# Patient Record
Sex: Male | Born: 1950
Health system: Southern US, Community
[De-identification: ages and names within clinical notes are randomized; demographics above are authoritative.]

## PROBLEM LIST (undated history)

## (undated) DIAGNOSIS — I739 Peripheral vascular disease, unspecified: Secondary | ICD-10-CM

## (undated) DIAGNOSIS — M1990 Unspecified osteoarthritis, unspecified site: Secondary | ICD-10-CM

## (undated) DIAGNOSIS — I7090 Unspecified atherosclerosis: Secondary | ICD-10-CM

## (undated) DIAGNOSIS — Z8489 Family history of other specified conditions: Secondary | ICD-10-CM

## (undated) DIAGNOSIS — N400 Enlarged prostate without lower urinary tract symptoms: Secondary | ICD-10-CM

## (undated) DIAGNOSIS — R7303 Prediabetes: Secondary | ICD-10-CM

## (undated) DIAGNOSIS — I10 Essential (primary) hypertension: Secondary | ICD-10-CM

## (undated) DIAGNOSIS — E7800 Pure hypercholesterolemia, unspecified: Secondary | ICD-10-CM

## (undated) DIAGNOSIS — K219 Gastro-esophageal reflux disease without esophagitis: Secondary | ICD-10-CM

## (undated) DIAGNOSIS — R972 Elevated prostate specific antigen [PSA]: Secondary | ICD-10-CM

## (undated) DIAGNOSIS — C4490 Unspecified malignant neoplasm of skin, unspecified: Secondary | ICD-10-CM

## (undated) DIAGNOSIS — F430 Acute stress reaction: Secondary | ICD-10-CM

## (undated) DIAGNOSIS — Z87442 Personal history of urinary calculi: Secondary | ICD-10-CM

## (undated) DIAGNOSIS — C801 Malignant (primary) neoplasm, unspecified: Secondary | ICD-10-CM

## (undated) HISTORY — DX: Benign prostatic hyperplasia without lower urinary tract symptoms: N40.0

## (undated) HISTORY — DX: Pure hypercholesterolemia, unspecified: E78.00

## (undated) HISTORY — DX: Essential (primary) hypertension: I10

## (undated) HISTORY — DX: Elevated prostate specific antigen (PSA): R97.20

## (undated) HISTORY — DX: Peripheral vascular disease, unspecified: I73.9

## (undated) HISTORY — DX: Malignant (primary) neoplasm, unspecified: C80.1

## (undated) HISTORY — DX: Unspecified atherosclerosis: I70.90

## (undated) HISTORY — DX: Acute stress reaction: F43.0

## (undated) HISTORY — PX: ILIAC ARTERY STENT: SHX1786

---

## 2000-06-22 ENCOUNTER — Other Ambulatory Visit: Admission: RE | Admit: 2000-06-22 | Discharge: 2000-06-22 | Payer: Self-pay | Admitting: Family Medicine

## 2002-08-28 ENCOUNTER — Emergency Department (HOSPITAL_COMMUNITY): Admission: EM | Admit: 2002-08-28 | Discharge: 2002-08-28 | Payer: Self-pay | Admitting: Emergency Medicine

## 2011-03-14 ENCOUNTER — Other Ambulatory Visit: Payer: Self-pay | Admitting: Interventional Cardiology

## 2011-03-14 ENCOUNTER — Encounter (HOSPITAL_COMMUNITY): Payer: Self-pay | Admitting: Pharmacy Technician

## 2011-03-17 ENCOUNTER — Other Ambulatory Visit: Payer: Self-pay

## 2011-03-17 ENCOUNTER — Ambulatory Visit (HOSPITAL_COMMUNITY)
Admission: RE | Admit: 2011-03-17 | Discharge: 2011-03-17 | Disposition: A | Discharge status: Home / Self Care | Payer: 59 | Source: Ambulatory Visit | Attending: Interventional Cardiology | Admitting: Interventional Cardiology

## 2011-03-17 ENCOUNTER — Encounter (HOSPITAL_COMMUNITY)
Admission: RE | Disposition: A | Discharge status: Home / Self Care | Payer: Self-pay | Source: Ambulatory Visit | Attending: Interventional Cardiology

## 2011-03-17 DIAGNOSIS — I70219 Atherosclerosis of native arteries of extremities with intermittent claudication, unspecified extremity: Secondary | ICD-10-CM | POA: Insufficient documentation

## 2011-03-17 HISTORY — PX: ABDOMINAL ANGIOGRAM: SHX5499

## 2011-03-17 HISTORY — PX: LOWER EXTREMITY ANGIOGRAM: SHX5508

## 2011-03-17 LAB — POCT ACTIVATED CLOTTING TIME
Activated Clotting Time: 193 seconds
Activated Clotting Time: 204 seconds
Activated Clotting Time: 215 seconds
Activated Clotting Time: 182 seconds

## 2011-03-17 SURGERY — ANGIOGRAM, LOWER EXTREMITY
Anesthesia: LOCAL

## 2011-03-17 MED ORDER — SODIUM CHLORIDE 0.9 % IV SOLN: 250.0000 mL | INTRAVENOUS | Status: DC | PRN

## 2011-03-17 MED ORDER — LIDOCAINE HCL (PF) 1 % IJ SOLN
INTRAMUSCULAR | Status: AC
  Filled 2011-03-17: qty 30

## 2011-03-17 MED ORDER — ACETAMINOPHEN 325 MG PO TABS: 650.0000 mg | ORAL_TABLET | ORAL | Status: DC | PRN

## 2011-03-17 MED ORDER — SODIUM CHLORIDE 0.9 % IJ SOLN: 3.0000 mL | INTRAMUSCULAR | Status: DC | PRN

## 2011-03-17 MED ORDER — SODIUM CHLORIDE 0.9 % IJ SOLN: 3.0000 mL | Freq: Two times a day (BID) | INTRAMUSCULAR | Status: DC

## 2011-03-17 MED ORDER — VERAPAMIL HCL 2.5 MG/ML IV SOLN
INTRAVENOUS | Status: AC
  Filled 2011-03-17: qty 2

## 2011-03-17 MED ORDER — ASPIRIN 81 MG PO CHEW
324.0000 mg | CHEWABLE_TABLET | ORAL | Status: AC
  Administered 2011-03-17: 324 mg via ORAL
  Filled 2011-03-17: qty 4

## 2011-03-17 MED ORDER — HEPARIN SODIUM (PORCINE) 1000 UNIT/ML IJ SOLN
INTRAMUSCULAR | Status: AC
  Filled 2011-03-17: qty 1

## 2011-03-17 MED ORDER — CLOPIDOGREL BISULFATE 75 MG PO TABS: 75.0000 mg | ORAL_TABLET | Freq: Every day | ORAL | Status: DC

## 2011-03-17 MED ORDER — MORPHINE SULFATE 4 MG/ML IJ SOLN: 1.0000 mg | INTRAMUSCULAR | Status: DC | PRN

## 2011-03-17 MED ORDER — FENTANYL CITRATE 0.05 MG/ML IJ SOLN
INTRAMUSCULAR | Status: AC
  Filled 2011-03-17: qty 2

## 2011-03-17 MED ORDER — CLOPIDOGREL BISULFATE 300 MG PO TABS
ORAL_TABLET | ORAL | Status: AC
  Filled 2011-03-17: qty 2

## 2011-03-17 MED ORDER — HEPARIN (PORCINE) IN NACL 2-0.9 UNIT/ML-% IJ SOLN
INTRAMUSCULAR | Status: AC
  Filled 2011-03-17: qty 2000

## 2011-03-17 MED ORDER — DIAZEPAM 5 MG PO TABS
5.0000 mg | ORAL_TABLET | ORAL | Status: AC
  Administered 2011-03-17: 5 mg via ORAL
  Filled 2011-03-17: qty 1

## 2011-03-17 MED ORDER — MIDAZOLAM HCL 2 MG/2ML IJ SOLN
INTRAMUSCULAR | Status: AC
  Filled 2011-03-17: qty 2

## 2011-03-17 MED ORDER — ASPIRIN 81 MG PO CHEW
81.0000 mg | CHEWABLE_TABLET | Freq: Every day | ORAL | Status: DC
Start: 2011-03-17 — End: 2011-03-17

## 2011-03-17 MED ORDER — CLOPIDOGREL BISULFATE 75 MG PO TABS: 75.0000 mg | ORAL_TABLET | Freq: Every day | ORAL | Status: AC

## 2011-03-17 MED ORDER — SODIUM CHLORIDE 0.9 % IV SOLN: 1.0000 mL/kg/h | INTRAVENOUS | Status: DC

## 2011-03-17 MED ORDER — SODIUM CHLORIDE 0.9 % IV SOLN
INTRAVENOUS | Status: DC
  Administered 2011-03-17: 10:00:00 via INTRAVENOUS

## 2011-03-17 MED ORDER — ONDANSETRON HCL 4 MG/2ML IJ SOLN: 4.0000 mg | Freq: Four times a day (QID) | INTRAMUSCULAR | Status: DC | PRN

## 2011-03-17 MED ORDER — ALUM & MAG HYDROXIDE-SIMETH 200-200-20 MG/5ML PO SUSP
ORAL | Status: AC
  Filled 2011-03-17: qty 30

## 2011-03-17 NOTE — CV Procedure (Signed)
PROCEDURE:  Abdominal aortogram, pelvic angiogram; right lower extremity runoff, left lower extremity runoff.  Rotational atherectomy of the left, distal SFA.  PTA of the left distal SFA.  INDICATIONS:  Persistent claudication  The risks, benefits, and details of the procedure were explained to the patient.  The patient verbalized understanding and wanted to proceed.  Informed written consent was obtained.  PROCEDURE TECHNIQUE:  After Xylocaine anesthesia a 41F sheath was placed in the right femoral artery with a single anterior needle wall stick.  A pigtail catheter was advanced to the abdominal aortogram.  Heart ejection contrast was performed in the AP projection.  The catheter was withdrawn to the aorta iliac bifurcation and a power injection of contrast was performed.  Through the sheath in the right common femoral artery, and power injection of contrast was performed to visualize the right lower extremity.  A 5 French crossover catheter was used to obtain access to the left iliac system.  A 5 French endhole catheter was advanced to the left common femoral artery.  Apparent ejection of contrast was performed through the endhole catheter to image the left lower extremity.   CONTRAST:  Total of 160 cc.  COMPLICATIONS:  None.     ANGIOGRAPHIC DATA:     The abdominal aortogram showed mild atherosclerosis in the distal portion of the aorta.  There is ectasia.  There are bilateral single renal arteries which appear widely patent.  The pelvic angiogram shows mild calcific disease in the right common iliac artery, but this is widely patent.  The left common iliac artery has mild disease and is also widely patent.  Bilateral internal iliac arteries are patent.  A lateral extra iliac arteries have mild atherosclerosis.  Right lower extremity: The right external iliac artery is widely patent.  The right common femoral artery is widely patent.  The right profunda femoral artery is widely patent.  The  proximal SFA has mild atherosclerosis but is widely patent.  The mid SFA has some calcification and appears widely patent.  There is moderate disease in the distal SFA leading into a severe stenosis in the adductor canal of 90%.  There is some calcification in the right popliteal but this vessel appears widely patent.  There is three-vessel runoff below the knee.  The anterior tibial artery has mild atherosclerosis.  Left lower extremity: The distal left external iliac artery appears widely patent.  The left common femoral artery appears widely patent.  The left profunda femoral artery is widely patent.  In the proximal SFA, there is mild to moderate atherosclerosis.  This continues in the mid SFA.  In the distal SFA, there is 5 cm segment of calcification with a focal 90% stenosis.  The popliteal artery has mild disease but is patent.  There is patent 3 vessel runoff below the knee.   INTERVENTIONAL NARRATIVE: A 6 French to remote destination sheath was used to obtain access to the left common femoral artery from the right groin.  This sheath was advanced over a versa core wire.  Heparin was used for anticoagulation.  An ACT was used to check that it was therapeutic.  A viper wire was advanced into the left SFA.  A diamondback burr was advanced to the left SFA.  Multiple passes were used at all 3 available speeds.  Repeat angiography showed significant improvement in the appearance of this calcified area.  A 4.0 x 4 cm balloon was inflated to post dilate the lesion.  There is an excellent angiographic  appearance.  3 vessel runoff was maintained.  IMPRESSIONS:  1. No abnormal aortic aneurysm.  Mild diffuse atherosclerosis in the vessels above the hip joints bilaterally without hemodynamically significant stenosis. 2.   90% focal distal right SFA lesion in the adductor canal. 3.   90% focal calcified lesion in the left distal SFA.  This was successfully treated with rotational atherectomy followed by  balloon angioplasty.  There is an excellent angiographic result.  There is no visible dissection.  There is a 10% residual stenosis.  RECOMMENDATION:  Continue aspirin and Plavix for at least a month.  He will be able to go home later today assuming no groin complications.  I will followup with him in the office.  He will take it easy for a few days and then resume regular walking.

## 2011-03-17 NOTE — H&P (Signed)
Date of Initial H&P: 03/11/11  History reviewed, patient examined, no change in status, stable for surgery.

## 2011-03-17 NOTE — Progress Notes (Signed)
Orthostatic vs done and pt tolerated well.  Removed bulk dressing and placed bandaid to site.  Ambulated pt in hallway and pt tolerted well.  Right groin remained level zero post ambulation.  Reviewed pt's d/c instructions with pt and family who both verbalized understanding.  Pt d/c home with wife via wheelchair

## 2013-03-20 ENCOUNTER — Encounter: Payer: Self-pay | Admitting: Interventional Cardiology

## 2013-04-25 ENCOUNTER — Ambulatory Visit: Payer: 59 | Admitting: Interventional Cardiology

## 2013-10-29 ENCOUNTER — Encounter: Payer: Self-pay | Admitting: *Deleted

## 2013-12-26 ENCOUNTER — Encounter (HOSPITAL_COMMUNITY): Payer: Self-pay | Admitting: Interventional Cardiology

## 2015-08-06 DIAGNOSIS — H6120 Impacted cerumen, unspecified ear: Secondary | ICD-10-CM | POA: Diagnosis not present

## 2015-08-11 DIAGNOSIS — L565 Disseminated superficial actinic porokeratosis (DSAP): Secondary | ICD-10-CM | POA: Diagnosis not present

## 2015-08-11 DIAGNOSIS — L821 Other seborrheic keratosis: Secondary | ICD-10-CM | POA: Diagnosis not present

## 2015-08-11 DIAGNOSIS — L720 Epidermal cyst: Secondary | ICD-10-CM | POA: Diagnosis not present

## 2015-08-11 DIAGNOSIS — L570 Actinic keratosis: Secondary | ICD-10-CM | POA: Diagnosis not present

## 2015-08-11 DIAGNOSIS — D692 Other nonthrombocytopenic purpura: Secondary | ICD-10-CM | POA: Diagnosis not present

## 2015-08-11 DIAGNOSIS — C44319 Basal cell carcinoma of skin of other parts of face: Secondary | ICD-10-CM | POA: Diagnosis not present

## 2015-08-11 DIAGNOSIS — D2372 Other benign neoplasm of skin of left lower limb, including hip: Secondary | ICD-10-CM | POA: Diagnosis not present

## 2015-08-11 DIAGNOSIS — D485 Neoplasm of uncertain behavior of skin: Secondary | ICD-10-CM | POA: Diagnosis not present

## 2015-08-11 DIAGNOSIS — Z85828 Personal history of other malignant neoplasm of skin: Secondary | ICD-10-CM | POA: Diagnosis not present

## 2015-08-11 DIAGNOSIS — Z85820 Personal history of malignant melanoma of skin: Secondary | ICD-10-CM | POA: Diagnosis not present

## 2015-08-27 DIAGNOSIS — C44319 Basal cell carcinoma of skin of other parts of face: Secondary | ICD-10-CM | POA: Diagnosis not present

## 2015-08-27 DIAGNOSIS — Z85828 Personal history of other malignant neoplasm of skin: Secondary | ICD-10-CM | POA: Diagnosis not present

## 2015-10-13 DIAGNOSIS — Z23 Encounter for immunization: Secondary | ICD-10-CM | POA: Diagnosis not present

## 2015-10-13 DIAGNOSIS — Z111 Encounter for screening for respiratory tuberculosis: Secondary | ICD-10-CM | POA: Diagnosis not present

## 2015-10-27 DIAGNOSIS — Z23 Encounter for immunization: Secondary | ICD-10-CM | POA: Diagnosis not present

## 2015-10-27 DIAGNOSIS — Z1211 Encounter for screening for malignant neoplasm of colon: Secondary | ICD-10-CM | POA: Diagnosis not present

## 2015-10-27 DIAGNOSIS — N400 Enlarged prostate without lower urinary tract symptoms: Secondary | ICD-10-CM | POA: Diagnosis not present

## 2015-10-27 DIAGNOSIS — Z0000 Encounter for general adult medical examination without abnormal findings: Secondary | ICD-10-CM | POA: Diagnosis not present

## 2015-10-27 DIAGNOSIS — R7309 Other abnormal glucose: Secondary | ICD-10-CM | POA: Diagnosis not present

## 2015-10-27 DIAGNOSIS — Z125 Encounter for screening for malignant neoplasm of prostate: Secondary | ICD-10-CM | POA: Diagnosis not present

## 2015-10-27 DIAGNOSIS — E7800 Pure hypercholesterolemia, unspecified: Secondary | ICD-10-CM | POA: Diagnosis not present

## 2015-11-09 DIAGNOSIS — Z1211 Encounter for screening for malignant neoplasm of colon: Secondary | ICD-10-CM | POA: Diagnosis not present

## 2016-01-12 DIAGNOSIS — J019 Acute sinusitis, unspecified: Secondary | ICD-10-CM | POA: Diagnosis not present

## 2016-02-15 DIAGNOSIS — L570 Actinic keratosis: Secondary | ICD-10-CM | POA: Diagnosis not present

## 2016-02-15 DIAGNOSIS — D1801 Hemangioma of skin and subcutaneous tissue: Secondary | ICD-10-CM | POA: Diagnosis not present

## 2016-02-15 DIAGNOSIS — L723 Sebaceous cyst: Secondary | ICD-10-CM | POA: Diagnosis not present

## 2016-02-15 DIAGNOSIS — D0339 Melanoma in situ of other parts of face: Secondary | ICD-10-CM | POA: Diagnosis not present

## 2016-02-15 DIAGNOSIS — L820 Inflamed seborrheic keratosis: Secondary | ICD-10-CM | POA: Diagnosis not present

## 2016-02-15 DIAGNOSIS — L821 Other seborrheic keratosis: Secondary | ICD-10-CM | POA: Diagnosis not present

## 2016-02-15 DIAGNOSIS — Z85828 Personal history of other malignant neoplasm of skin: Secondary | ICD-10-CM | POA: Diagnosis not present

## 2016-02-18 DIAGNOSIS — Z85828 Personal history of other malignant neoplasm of skin: Secondary | ICD-10-CM | POA: Diagnosis not present

## 2016-02-18 DIAGNOSIS — L720 Epidermal cyst: Secondary | ICD-10-CM | POA: Diagnosis not present

## 2016-02-29 DIAGNOSIS — Z85828 Personal history of other malignant neoplasm of skin: Secondary | ICD-10-CM | POA: Diagnosis not present

## 2016-02-29 DIAGNOSIS — D0339 Melanoma in situ of other parts of face: Secondary | ICD-10-CM | POA: Diagnosis not present

## 2016-03-01 DIAGNOSIS — D0339 Melanoma in situ of other parts of face: Secondary | ICD-10-CM | POA: Diagnosis not present

## 2016-05-03 DIAGNOSIS — R972 Elevated prostate specific antigen [PSA]: Secondary | ICD-10-CM | POA: Diagnosis not present

## 2016-08-04 DIAGNOSIS — R05 Cough: Secondary | ICD-10-CM | POA: Diagnosis not present

## 2016-08-04 DIAGNOSIS — J069 Acute upper respiratory infection, unspecified: Secondary | ICD-10-CM | POA: Diagnosis not present

## 2016-08-16 DIAGNOSIS — D1801 Hemangioma of skin and subcutaneous tissue: Secondary | ICD-10-CM | POA: Diagnosis not present

## 2016-08-16 DIAGNOSIS — Z85828 Personal history of other malignant neoplasm of skin: Secondary | ICD-10-CM | POA: Diagnosis not present

## 2016-08-16 DIAGNOSIS — Z85820 Personal history of malignant melanoma of skin: Secondary | ICD-10-CM | POA: Diagnosis not present

## 2016-08-16 DIAGNOSIS — L821 Other seborrheic keratosis: Secondary | ICD-10-CM | POA: Diagnosis not present

## 2016-08-16 DIAGNOSIS — L430 Hypertrophic lichen planus: Secondary | ICD-10-CM | POA: Diagnosis not present

## 2016-11-30 DIAGNOSIS — Z1211 Encounter for screening for malignant neoplasm of colon: Secondary | ICD-10-CM | POA: Diagnosis not present

## 2016-11-30 DIAGNOSIS — R972 Elevated prostate specific antigen [PSA]: Secondary | ICD-10-CM | POA: Diagnosis not present

## 2016-11-30 DIAGNOSIS — I70219 Atherosclerosis of native arteries of extremities with intermittent claudication, unspecified extremity: Secondary | ICD-10-CM | POA: Diagnosis not present

## 2016-11-30 DIAGNOSIS — Z1159 Encounter for screening for other viral diseases: Secondary | ICD-10-CM | POA: Diagnosis not present

## 2016-11-30 DIAGNOSIS — N400 Enlarged prostate without lower urinary tract symptoms: Secondary | ICD-10-CM | POA: Diagnosis not present

## 2016-11-30 DIAGNOSIS — Z0000 Encounter for general adult medical examination without abnormal findings: Secondary | ICD-10-CM | POA: Diagnosis not present

## 2016-11-30 DIAGNOSIS — E7800 Pure hypercholesterolemia, unspecified: Secondary | ICD-10-CM | POA: Diagnosis not present

## 2016-12-02 ENCOUNTER — Encounter: Payer: Self-pay | Admitting: Vascular Surgery

## 2016-12-28 ENCOUNTER — Encounter: Payer: Medicare Other | Admitting: Vascular Surgery

## 2017-01-30 DIAGNOSIS — N401 Enlarged prostate with lower urinary tract symptoms: Secondary | ICD-10-CM | POA: Diagnosis not present

## 2017-01-30 DIAGNOSIS — R972 Elevated prostate specific antigen [PSA]: Secondary | ICD-10-CM | POA: Diagnosis not present

## 2017-01-30 DIAGNOSIS — R350 Frequency of micturition: Secondary | ICD-10-CM | POA: Diagnosis not present

## 2017-02-16 DIAGNOSIS — Z85820 Personal history of malignant melanoma of skin: Secondary | ICD-10-CM | POA: Diagnosis not present

## 2017-02-16 DIAGNOSIS — D045 Carcinoma in situ of skin of trunk: Secondary | ICD-10-CM | POA: Diagnosis not present

## 2017-02-16 DIAGNOSIS — Z85828 Personal history of other malignant neoplasm of skin: Secondary | ICD-10-CM | POA: Diagnosis not present

## 2017-02-16 DIAGNOSIS — D485 Neoplasm of uncertain behavior of skin: Secondary | ICD-10-CM | POA: Diagnosis not present

## 2017-02-16 DIAGNOSIS — L821 Other seborrheic keratosis: Secondary | ICD-10-CM | POA: Diagnosis not present

## 2017-02-16 DIAGNOSIS — D0439 Carcinoma in situ of skin of other parts of face: Secondary | ICD-10-CM | POA: Diagnosis not present

## 2017-02-22 ENCOUNTER — Ambulatory Visit (INDEPENDENT_AMBULATORY_CARE_PROVIDER_SITE_OTHER): Payer: Medicare Other | Admitting: Vascular Surgery

## 2017-02-22 ENCOUNTER — Other Ambulatory Visit: Payer: Self-pay | Admitting: *Deleted

## 2017-02-22 ENCOUNTER — Encounter: Payer: Self-pay | Admitting: *Deleted

## 2017-02-22 ENCOUNTER — Encounter: Payer: Self-pay | Admitting: Vascular Surgery

## 2017-02-22 VITALS — BP 140/87 | HR 63 | Temp 97.8°F | Resp 20 | Ht 70.0 in | Wt 229.9 lb

## 2017-02-22 DIAGNOSIS — I70219 Atherosclerosis of native arteries of extremities with intermittent claudication, unspecified extremity: Secondary | ICD-10-CM

## 2017-02-22 NOTE — H&P (View-Only) (Signed)
Patient name: Dennis Patel MRN: [id number] DOB: 11-Nov-1950 Sex: male   REASON FOR CONSULT:    Peripheral vascular disease.  The consult is requested by Dr. Donnie Coffin.  HPI:   Dennis Patel is a pleasant 67 y.o. male, who has a long history of bilateral lower extremity claudication.  This is been going on for about 7-8 years.  He walks about 2 miles a day.  If he walks less his symptoms increase he experiences pain in both calves which is brought on by ambulation and relieved with rest.  This is worse on the left side.  He is also developed some rest pain in both legs especially on the left.  The patient's only real risk factor for peripheral vascular disease is a remote history of tobacco use.  He denies any history of diabetes, hypertension, hypercholesterolemia, or family history of premature cardiovascular disease.  I have reviewed the records that were sent from the referring office.  The patient was seen on 11/30/2016.  The patient was therefore a periodic wellness exam.  Patient had bilateral lower extremity claudication and was sent for vascular consultation.  The patient is also followed with benign prostatic hypertrophy and hypercholesterolemia.   Past Medical History:  Diagnosis Date  . Atherosclerosis   . BPH (benign prostatic hyperplasia)   . Elevated PSA   . Peripheral vascular disease ([place])   . Pure hypercholesterolemia   . 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 Cancer Mother   . Diabetes Mother   . CAD Father   . Diabetes Sister   . CAD Son   . Diabetes Son   . Heart disease Son    There is no family history of premature cardiovascular disease.  SOCIAL HISTORY: He quit smoking 15 years ago. Social History   Socioeconomic History  . Marital status: Married    Spouse name: Not on file  . Number of children: Not on file  . Years of education: Not on file  . Highest education level: Not on file    Social Needs  . Financial resource strain: Not on file  . Food insecurity - worry: Not on file  . Food insecurity - inability: Not on file  . Transportation needs - medical: Not on file  . Transportation needs - non-medical: Not on file  Occupational History  . Not on file  Tobacco Use  . Smoking status: Former Smoker    Types: Cigarettes    Last attempt to quit: 2005    Years since quitting: 14.1  . Smokeless tobacco: Never Used  Substance and Sexual Activity  . Alcohol use: Not on file  . Drug use: Not on file  . Sexual activity: Not on file  Other Topics Concern  . Not on file  Social History Narrative  . Not on file    No Known Allergies  Current Outpatient Medications  Medication Sig Dispense Refill  . doxycycline (VIBRA-TABS) 100 MG tablet Take 100 mg by mouth every morning.    . Multiple Vitamin (MULITIVITAMIN WITH MINERALS) TABS Take 1 tablet by mouth daily.    Marland Kitchen acetaminophen (TYLENOL) 500 MG tablet Take 500 mg by mouth every 6 (six) hours as needed. For pain    . aspirin EC 81 MG tablet Take 81 mg by mouth daily.    . fish oil-omega-3 fatty acids 1000 MG capsule Take 1 g by mouth  daily.      No current facility-administered medications for this visit.     REVIEW OF SYSTEMS:  [X]  denotes positive finding, [ ]  denotes negative finding Cardiac  Comments:  Chest pain or chest pressure:    Shortness of breath upon exertion:    Short of breath when lying flat:    Irregular heart rhythm:        Vascular    Pain in calf, thigh, or hip brought on by ambulation: x   Pain in feet at night that wakes you up from your sleep:  x   Blood clot in your veins:    Leg swelling:         Pulmonary    Oxygen at home:    Productive cough:     Wheezing:         Neurologic    Sudden weakness in arms or legs:     Sudden numbness in arms or legs:     Sudden onset of difficulty speaking or slurred speech:    Temporary loss of vision in one eye:     Problems with  dizziness:         Gastrointestinal    Blood in stool:     Vomited blood:         Genitourinary    Burning when urinating:     Blood in urine:        Psychiatric    Major depression:         Hematologic    Bleeding problems:    Problems with blood clotting too easily:        Skin    Rashes or ulcers:        Constitutional    Fever or chills:     PHYSICAL EXAM:   Vitals:   02/22/17 1010 02/22/17 1013  BP: (!) 142/87 140/87  Pulse: 63   Resp: 20   Temp: 97.8 F (36.6 C)   TempSrc: Oral   SpO2: 97%   Weight: 229 lb 14.4 oz (104.3 kg)   Height: 5\' 10"  (1.778 m)     GENERAL: The patient is a well-nourished male, in no acute distress. The vital signs are documented above. CARDIAC: There is a regular rate and rhythm.  VASCULAR: I do not detect carotid bruits. He has diminished but palpable femoral pulses.  I cannot palpate popliteal or pedal pulses. He has monophasic signals in the dorsalis pedis and posterior tibial positions bilaterally. PULMONARY: There is good air exchange bilaterally without wheezing or rales. ABDOMEN: Soft and non-tender with normal pitched bowel sounds.  MUSCULOSKELETAL: There are no major deformities or cyanosis. NEUROLOGIC: No focal weakness or paresthesias are detected. SKIN: There are no ulcers or rashes noted. PSYCHIATRIC: The patient has a normal affect.  DATA:    LOWER EXTREMITY DUPLEX: I did review the duplex scan that was done on 10/07/2011.  The patient at that time had moderate disease of the distal right superficial femoral artery and also moderate disease in the left distal superficial femoral artery.  The ABI on the right was 96% on the ABI on the left was 82%  MEDICAL ISSUES:   PERIPHERAL VASCULAR DISEASE WITH CLAUDICATION AND REST PAIN: This patient has evidence of multilevel arterial occlusive disease.  Femoral pulses are slightly diminished although he has no hip or thigh claudication.  His Doppler signals are markedly dampened  and monophasic suggesting significant infrainguinal arterial occlusive disease.  We have discussed all the conservative options including  a structured walking program.  I explained that hopefully will have a vascular rehab program up and running at [facility] in the near future.  I think he be an excellent candidate for that.  This is currently not available.  He quit smoking 15 years ago.  We have also discussed the importance of nutrition.  He feels that his symptoms are significantly disabling and for this reason would like to consider a more aggressive workup which I think is reasonable.  This reason I recommended arteriography.  I have reviewed with the patient the indications for arteriography. In addition, I have reviewed the potential complications of arteriography including but not limited to: Bleeding, arterial injury, arterial thrombosis, dye action, renal insufficiency, or other unpredictable medical problems. I have explained to the patient that if we find disease amenable to angioplasty we could potentially address this at the same time. I have discussed the potential complications of angioplasty and stenting, including but not limited to: Bleeding, arterial thrombosis, arterial injury, dissection, or the need for surgical intervention.  His procedure is scheduled on 03/03/2017.  Deitra Mayo [facility] [address] [phone]

## 2017-02-22 NOTE — Progress Notes (Signed)
Patient name: Dennis Patel MRN: [id number] DOB: 1950/04/01 Sex: male   REASON FOR CONSULT:    Peripheral vascular disease.  The consult is requested by Dr. Donnie Coffin.  HPI:   Dennis Patel is a pleasant 67 y.o. male, who has a long history of bilateral lower extremity claudication.  This is been going on for about 7-8 years.  He walks about 2 miles a day.  If he walks less his symptoms increase he experiences pain in both calves which is brought on by ambulation and relieved with rest.  This is worse on the left side.  He is also developed some rest pain in both legs especially on the left.  The patient's only real risk factor for peripheral vascular disease is a remote history of tobacco use.  He denies any history of diabetes, hypertension, hypercholesterolemia, or family history of premature cardiovascular disease.  I have reviewed the records that were sent from the referring office.  The patient was seen on 11/30/2016.  The patient was therefore a periodic wellness exam.  Patient had bilateral lower extremity claudication and was sent for vascular consultation.  The patient is also followed with benign prostatic hypertrophy and hypercholesterolemia.   Past Medical History:  Diagnosis Date  . Atherosclerosis   . BPH (benign prostatic hyperplasia)   . Elevated PSA   . Peripheral vascular disease ([place])   . Pure hypercholesterolemia   . 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 Cancer Mother   . Diabetes Mother   . CAD Father   . Diabetes Sister   . CAD Son   . Diabetes Son   . Heart disease Son    There is no family history of premature cardiovascular disease.  SOCIAL HISTORY: He quit smoking 15 years ago. Social History   Socioeconomic History  . Marital status: Married    Spouse name: Not on file  . Number of children: Not on file  . Years of education: Not on file  . Highest education level: Not on file    Social Needs  . Financial resource strain: Not on file  . Food insecurity - worry: Not on file  . Food insecurity - inability: Not on file  . Transportation needs - medical: Not on file  . Transportation needs - non-medical: Not on file  Occupational History  . Not on file  Tobacco Use  . Smoking status: Former Smoker    Types: Cigarettes    Last attempt to quit: 2005    Years since quitting: 14.1  . Smokeless tobacco: Never Used  Substance and Sexual Activity  . Alcohol use: Not on file  . Drug use: Not on file  . Sexual activity: Not on file  Other Topics Concern  . Not on file  Social History Narrative  . Not on file    No Known Allergies  Current Outpatient Medications  Medication Sig Dispense Refill  . doxycycline (VIBRA-TABS) 100 MG tablet Take 100 mg by mouth every morning.    . Multiple Vitamin (MULITIVITAMIN WITH MINERALS) TABS Take 1 tablet by mouth daily.    Marland Kitchen acetaminophen (TYLENOL) 500 MG tablet Take 500 mg by mouth every 6 (six) hours as needed. For pain    . aspirin EC 81 MG tablet Take 81 mg by mouth daily.    . fish oil-omega-3 fatty acids 1000 MG capsule Take 1 g by mouth  daily.      No current facility-administered medications for this visit.     REVIEW OF SYSTEMS:  [X]  denotes positive finding, [ ]  denotes negative finding Cardiac  Comments:  Chest pain or chest pressure:    Shortness of breath upon exertion:    Short of breath when lying flat:    Irregular heart rhythm:        Vascular    Pain in calf, thigh, or hip brought on by ambulation: x   Pain in feet at night that wakes you up from your sleep:  x   Blood clot in your veins:    Leg swelling:         Pulmonary    Oxygen at home:    Productive cough:     Wheezing:         Neurologic    Sudden weakness in arms or legs:     Sudden numbness in arms or legs:     Sudden onset of difficulty speaking or slurred speech:    Temporary loss of vision in one eye:     Problems with  dizziness:         Gastrointestinal    Blood in stool:     Vomited blood:         Genitourinary    Burning when urinating:     Blood in urine:        Psychiatric    Major depression:         Hematologic    Bleeding problems:    Problems with blood clotting too easily:        Skin    Rashes or ulcers:        Constitutional    Fever or chills:     PHYSICAL EXAM:   Vitals:   02/22/17 1010 02/22/17 1013  BP: (!) 142/87 140/87  Pulse: 63   Resp: 20   Temp: 97.8 F (36.6 C)   TempSrc: Oral   SpO2: 97%   Weight: 229 lb 14.4 oz (104.3 kg)   Height: 5\' 10"  (1.778 m)     GENERAL: The patient is a well-nourished male, in no acute distress. The vital signs are documented above. CARDIAC: There is a regular rate and rhythm.  VASCULAR: I do not detect carotid bruits. He has diminished but palpable femoral pulses.  I cannot palpate popliteal or pedal pulses. He has monophasic signals in the dorsalis pedis and posterior tibial positions bilaterally. PULMONARY: There is good air exchange bilaterally without wheezing or rales. ABDOMEN: Soft and non-tender with normal pitched bowel sounds.  MUSCULOSKELETAL: There are no major deformities or cyanosis. NEUROLOGIC: No focal weakness or paresthesias are detected. SKIN: There are no ulcers or rashes noted. PSYCHIATRIC: The patient has a normal affect.  DATA:    LOWER EXTREMITY DUPLEX: I did review the duplex scan that was done on 10/07/2011.  The patient at that time had moderate disease of the distal right superficial femoral artery and also moderate disease in the left distal superficial femoral artery.  The ABI on the right was 96% on the ABI on the left was 82%  MEDICAL ISSUES:   PERIPHERAL VASCULAR DISEASE WITH CLAUDICATION AND REST PAIN: This patient has evidence of multilevel arterial occlusive disease.  Femoral pulses are slightly diminished although he has no hip or thigh claudication.  His Doppler signals are markedly dampened  and monophasic suggesting significant infrainguinal arterial occlusive disease.  We have discussed all the conservative options including  a structured walking program.  I explained that hopefully will have a vascular rehab program up and running at [facility] in the near future.  I think he be an excellent candidate for that.  This is currently not available.  He quit smoking 15 years ago.  We have also discussed the importance of nutrition.  He feels that his symptoms are significantly disabling and for this reason would like to consider a more aggressive workup which I think is reasonable.  This reason I recommended arteriography.  I have reviewed with the patient the indications for arteriography. In addition, I have reviewed the potential complications of arteriography including but not limited to: Bleeding, arterial injury, arterial thrombosis, dye action, renal insufficiency, or other unpredictable medical problems. I have explained to the patient that if we find disease amenable to angioplasty we could potentially address this at the same time. I have discussed the potential complications of angioplasty and stenting, including but not limited to: Bleeding, arterial thrombosis, arterial injury, dissection, or the need for surgical intervention.  His procedure is scheduled on 03/03/2017.  Deitra Mayo [facility] [phone]

## 2017-03-08 DIAGNOSIS — R972 Elevated prostate specific antigen [PSA]: Secondary | ICD-10-CM | POA: Diagnosis not present

## 2017-03-17 ENCOUNTER — Ambulatory Visit (HOSPITAL_COMMUNITY)
Admission: RE | Admit: 2017-03-17 | Discharge: 2017-03-18 | Disposition: A | Discharge status: Home / Self Care | Payer: Medicare Other | Source: Ambulatory Visit | Attending: Vascular Surgery | Admitting: Vascular Surgery

## 2017-03-17 ENCOUNTER — Encounter (HOSPITAL_COMMUNITY): Payer: Self-pay | Admitting: Vascular Surgery

## 2017-03-17 ENCOUNTER — Encounter (HOSPITAL_COMMUNITY)
Admission: RE | Disposition: A | Discharge status: Home / Self Care | Payer: Self-pay | Source: Ambulatory Visit | Attending: Vascular Surgery

## 2017-03-17 ENCOUNTER — Other Ambulatory Visit: Payer: Self-pay

## 2017-03-17 DIAGNOSIS — I70213 Atherosclerosis of native arteries of extremities with intermittent claudication, bilateral legs: Secondary | ICD-10-CM

## 2017-03-17 DIAGNOSIS — N400 Enlarged prostate without lower urinary tract symptoms: Secondary | ICD-10-CM | POA: Diagnosis not present

## 2017-03-17 DIAGNOSIS — Z8249 Family history of ischemic heart disease and other diseases of the circulatory system: Secondary | ICD-10-CM | POA: Insufficient documentation

## 2017-03-17 DIAGNOSIS — I70223 Atherosclerosis of native arteries of extremities with rest pain, bilateral legs: Secondary | ICD-10-CM | POA: Insufficient documentation

## 2017-03-17 DIAGNOSIS — Z6832 Body mass index (BMI) 32.0-32.9, adult: Secondary | ICD-10-CM | POA: Insufficient documentation

## 2017-03-17 DIAGNOSIS — E7800 Pure hypercholesterolemia, unspecified: Secondary | ICD-10-CM | POA: Diagnosis not present

## 2017-03-17 DIAGNOSIS — Z7982 Long term (current) use of aspirin: Secondary | ICD-10-CM | POA: Diagnosis not present

## 2017-03-17 DIAGNOSIS — E669 Obesity, unspecified: Secondary | ICD-10-CM | POA: Diagnosis not present

## 2017-03-17 DIAGNOSIS — I739 Peripheral vascular disease, unspecified: Secondary | ICD-10-CM | POA: Diagnosis present

## 2017-03-17 DIAGNOSIS — Z87891 Personal history of nicotine dependence: Secondary | ICD-10-CM | POA: Insufficient documentation

## 2017-03-17 HISTORY — DX: Personal history of urinary calculi: Z87.442

## 2017-03-17 HISTORY — PX: ABDOMINAL AORTOGRAM W/LOWER EXTREMITY: CATH118223

## 2017-03-17 HISTORY — DX: Family history of other specified conditions: Z84.89

## 2017-03-17 HISTORY — DX: Gastro-esophageal reflux disease without esophagitis: K21.9

## 2017-03-17 LAB — POCT I-STAT, CHEM 8
BUN: 18 mg/dL (ref 6–20)
CALCIUM ION: 1.17 mmol/L (ref 1.15–1.40)
CREATININE: 1.1 mg/dL (ref 0.61–1.24)
Chloride: 107 mmol/L (ref 101–111)
GLUCOSE: 124 mg/dL — AB (ref 65–99)
HCT: 43 % (ref 39.0–52.0)
HEMOGLOBIN: 14.6 g/dL (ref 13.0–17.0)
POTASSIUM: 4.5 mmol/L (ref 3.5–5.1)
Sodium: 141 mmol/L (ref 135–145)
TCO2: 26 mmol/L (ref 22–32)

## 2017-03-17 LAB — POCT ACTIVATED CLOTTING TIME
Activated Clotting Time: 230 seconds
Activated Clotting Time: 208 seconds
Activated Clotting Time: 169 seconds

## 2017-03-17 SURGERY — ABDOMINAL AORTOGRAM W/LOWER EXTREMITY
Anesthesia: LOCAL

## 2017-03-17 MED ORDER — OMEGA-3 FATTY ACIDS 1000 MG PO CAPS: 1.0000 g | ORAL_CAPSULE | Freq: Every day | ORAL | Status: DC

## 2017-03-17 MED ORDER — SODIUM CHLORIDE 0.9% FLUSH: 3.0000 mL | INTRAVENOUS | Status: DC | PRN

## 2017-03-17 MED ORDER — OXYCODONE HCL 5 MG PO TABS: 5.0000 mg | ORAL_TABLET | ORAL | Status: DC | PRN

## 2017-03-17 MED ORDER — OMEGA-3-ACID ETHYL ESTERS 1 G PO CAPS: 1.0000 g | ORAL_CAPSULE | Freq: Two times a day (BID) | ORAL | Status: DC

## 2017-03-17 MED ORDER — LABETALOL HCL 5 MG/ML IV SOLN
INTRAVENOUS | Status: AC
  Filled 2017-03-17: qty 4

## 2017-03-17 MED ORDER — MIDAZOLAM HCL 2 MG/2ML IJ SOLN
INTRAMUSCULAR | Status: AC
  Filled 2017-03-17: qty 2

## 2017-03-17 MED ORDER — LIDOCAINE HCL (CARDIAC) 20 MG/ML IV SOLN
INTRAVENOUS | Status: AC
  Filled 2017-03-17: qty 5

## 2017-03-17 MED ORDER — HYDRALAZINE HCL 20 MG/ML IJ SOLN
5.0000 mg | INTRAMUSCULAR | Status: DC | PRN
  Administered 2017-03-17: 5 mg via INTRAVENOUS
  Filled 2017-03-17: qty 1

## 2017-03-17 MED ORDER — MORPHINE SULFATE (PF) 4 MG/ML IV SOLN
INTRAVENOUS | Status: AC
  Filled 2017-03-17: qty 1

## 2017-03-17 MED ORDER — MORPHINE SULFATE (PF) 4 MG/ML IV SOLN
2.0000 mg | INTRAVENOUS | Status: DC | PRN
  Administered 2017-03-17 (×2): 2 mg via INTRAVENOUS

## 2017-03-17 MED ORDER — MIDAZOLAM HCL 2 MG/2ML IJ SOLN
INTRAMUSCULAR | Status: DC | PRN
  Administered 2017-03-17 (×3): 1 mg via INTRAVENOUS

## 2017-03-17 MED ORDER — ACETAMINOPHEN 325 MG PO TABS: 650.0000 mg | ORAL_TABLET | ORAL | Status: DC | PRN

## 2017-03-17 MED ORDER — SODIUM CHLORIDE 0.9 % IV SOLN
INTRAVENOUS | Status: DC
  Administered 2017-03-17: 06:00:00 via INTRAVENOUS

## 2017-03-17 MED ORDER — FENTANYL CITRATE (PF) 100 MCG/2ML IJ SOLN
INTRAMUSCULAR | Status: AC
  Filled 2017-03-17: qty 2

## 2017-03-17 MED ORDER — IODIXANOL 320 MG/ML IV SOLN
INTRAVENOUS | Status: DC | PRN
  Administered 2017-03-17: 170 mL via INTRA_ARTERIAL

## 2017-03-17 MED ORDER — SODIUM CHLORIDE 0.9 % WEIGHT BASED INFUSION: 1.0000 mL/kg/h | INTRAVENOUS | Status: AC

## 2017-03-17 MED ORDER — HEPARIN (PORCINE) IN NACL 2-0.9 UNIT/ML-% IJ SOLN
INTRAMUSCULAR | Status: AC
  Filled 2017-03-17: qty 1000

## 2017-03-17 MED ORDER — LIDOCAINE HCL (PF) 1 % IJ SOLN
INTRAMUSCULAR | Status: DC | PRN
  Administered 2017-03-17: 15 mL
  Administered 2017-03-17: 20 mL

## 2017-03-17 MED ORDER — SODIUM CHLORIDE 0.9 % IV SOLN: 250.0000 mL | INTRAVENOUS | Status: DC | PRN

## 2017-03-17 MED ORDER — FENTANYL CITRATE (PF) 100 MCG/2ML IJ SOLN
INTRAMUSCULAR | Status: DC | PRN
  Administered 2017-03-17: 25 ug via INTRAVENOUS
  Administered 2017-03-17 (×2): 50 ug via INTRAVENOUS
  Administered 2017-03-17: 25 ug via INTRAVENOUS

## 2017-03-17 MED ORDER — LIDOCAINE HCL 1 % IJ SOLN
INTRAMUSCULAR | Status: AC
  Filled 2017-03-17: qty 20

## 2017-03-17 MED ORDER — HEPARIN SODIUM (PORCINE) 1000 UNIT/ML IJ SOLN
INTRAMUSCULAR | Status: DC | PRN
  Administered 2017-03-17: 8000 [IU] via INTRAVENOUS

## 2017-03-17 MED ORDER — ACETAMINOPHEN 325 MG PO TABS: 325.0000 mg | ORAL_TABLET | Freq: Four times a day (QID) | ORAL | Status: DC | PRN

## 2017-03-17 MED ORDER — SODIUM CHLORIDE 0.9% FLUSH: 3.0000 mL | Freq: Two times a day (BID) | INTRAVENOUS | Status: DC

## 2017-03-17 MED ORDER — ASPIRIN EC 81 MG PO TBEC
81.0000 mg | DELAYED_RELEASE_TABLET | Freq: Every day | ORAL | Status: DC
  Filled 2017-03-17: qty 1

## 2017-03-17 MED ORDER — ADULT MULTIVITAMIN W/MINERALS CH
1.0000 | ORAL_TABLET | Freq: Every day | ORAL | Status: DC
  Filled 2017-03-17: qty 1

## 2017-03-17 MED ORDER — LABETALOL HCL 5 MG/ML IV SOLN
10.0000 mg | INTRAVENOUS | Status: DC | PRN
  Administered 2017-03-17: 10 mg via INTRAVENOUS

## 2017-03-17 MED ORDER — ONDANSETRON HCL 4 MG/2ML IJ SOLN: 4.0000 mg | Freq: Four times a day (QID) | INTRAMUSCULAR | Status: DC | PRN

## 2017-03-17 SURGICAL SUPPLY — 23 items
CATH ANGIO 5F PIGTAIL 65CM (CATHETERS) ×2 IMPLANT
CATH STRAIGHT 5FR 65CM (CATHETERS) ×2 IMPLANT
COVER PRB 48X5XTLSCP FOLD TPE (BAG) ×1 IMPLANT
COVER PROBE 5X48 (BAG) ×1
KIT ENCORE 26 ADVANTAGE (KITS) ×2 IMPLANT
KIT MICROPUNCTURE NIT STIFF (SHEATH) ×2 IMPLANT
KIT PV (KITS) ×2 IMPLANT
SHEATH BRITE TIP 7FR 35CM (SHEATH) ×2 IMPLANT
SHEATH PINNACLE 5F 10CM (SHEATH) ×2 IMPLANT
SHEATH PINNACLE 6F 10CM (SHEATH) ×2 IMPLANT
SHEATH PINNACLE 7F 10CM (SHEATH) ×2 IMPLANT
SHEATH PINNACLE MP 6F 45CM (SHEATH) ×2 IMPLANT
SHEATH PINNACLE ST 6F 45CM (SHEATH) ×2 IMPLANT
STENT VIABAHN 7X29X80 VBX (Permanent Stent) ×2 IMPLANT
STOPCOCK MORSE 400PSI 3WAY (MISCELLANEOUS) ×2 IMPLANT
SYRINGE MEDRAD AVANTA MACH 7 (SYRINGE) ×2 IMPLANT
TRANSDUCER W/STOPCOCK (MISCELLANEOUS) ×2 IMPLANT
TRAY PV CATH (CUSTOM PROCEDURE TRAY) ×2 IMPLANT
TUBING CIL FLEX 10 FLL-RA (TUBING) ×2 IMPLANT
WIRE AMPLATZ SS-J .035X180CM (WIRE) ×2 IMPLANT
WIRE BENTSON .035X145CM (WIRE) ×2 IMPLANT
WIRE ROSEN-J .035X180CM (WIRE) ×2 IMPLANT
WIRE TORQFLEX AUST .018X40CM (WIRE) ×2 IMPLANT

## 2017-03-17 NOTE — Progress Notes (Addendum)
Site area: Right groin a 5 french arterial sheath was removed  Site Prior to Removal:  Level 0  Pressure Applied For 30 MINUTES    Bedrest Beginning at 1110am  Manual:   Yes.    Patient Status During Pull:  stable  Post Pull Groin Site:  Level 0  Post Pull Instructions Given:  Yes.    Post Pull Pulses Present:  Yes.    Dressing Applied:  Yes.    Comments:  VS remain stable

## 2017-03-17 NOTE — Op Note (Signed)
PATIENT: Dennis Patel      MRN: [id number] DOB: 01-Jun-1950    DATE OF PROCEDURE: 03/17/2017  INDICATIONS:    Dennis Patel is a 67 y.o. male who presented with bilateral lower extremity claudication.  We discussed conservative treatment however he felt strongly that he wanted to pursue further workup and possible intervention.  He presents for arteriography and possible intervention.  PROCEDURE:    1.  Conscious sedation 2.  Ultrasound-guided access to the right common femoral artery 3.  Ultrasound-guided access to the left common femoral artery 4.  Aortogram and bilateral iliac arteriogram 5.  Angioplasty and stenting of the left common iliac artery (7 mm x 29 mm VBX covered stent) 6.  Retrograde right femoral arteriogram 7.  Retrograde left femoral arteriogram  SURGEON: Judeth Cornfield. Scot Dock, MD, FACS  ANESTHESIA: Local with sedation  EBL: Minimal  TECHNIQUE: The patient was brought to the peripheral vascular lab and was sedated. The period of conscious sedation was 85 minutes.  During that time period, I was present face-to-face 100% of the time.  The patient was administered 1 mg of Versed and 50 mcg of fentanyl.  He received additional doses throughout the case. The patient's heart rate, blood pressure, and oxygen saturation were monitored by the nurse continuously during the procedure.  Under ultrasound guidance, after the skin was anesthetized, I cannulated the right common femoral artery with a micropuncture needle and a micropuncture sheath was introduced over the wire.  This was exchanged for a 5 [place] sheath over a Bentson wire.  A pigtail catheter was positioned at the L1 vertebral body and flush aortogram obtained.  The catheter was then positioned above the aortic bifurcation and an oblique iliac projection was obtained.  He was found to have a 90% left common iliac artery stenosis which was a bulky calcific plaque.  I felt that we could potentially address this with  angioplasty and stenting.  On the left side the skin was anesthetized.  The artery was calcified and the patient had significant obesity but I was able to identify a location in the artery are felt we could safely cannulate.  I felt this was just below the inguinal ligament.  The artery was cannulated with a micropuncture needle and a micropuncture sheath introduced over the wire.  We had difficult time passing the 6 French sheath and therefore I exchanged for a Rosen wire and dilated the tract.  Again there was difficulty in passing the 6 [place] sheath.  Therefore he went to an Amplatz wire.  The tract was dilated and I was able to get the 6 [place] destination sheath into the common iliac artery on the left.  Arteriogram was obtained to demonstrate the stenosis.  I selected a 7 mm x 29 mm covered stent.  This required a 7 [place] sheath so the 6 [place] sheath was exchanged for a 7 [place] Brite-tip sheath.  The patient was heparinized.  ACT was 230.  The 7 mm x 29 mm VBX stent was deployed without difficulty.  Completion film showed an excellent result with no residual stenosis.  Next a retrograde right femoral arteriogram was obtained through the right femoral sheath.  A left femoral arteriogram was obtained to the left femoral sheath.  The patient was transferred to the holding area for removal of the sheath once the ACT was adequate.   FINDINGS:   1.  Single renal arteries bilaterally with no significant renal artery stenosis identified. 2.  The infrarenal  aorta is widely patent. 3.  On the left side, there was a 90% left common iliac artery stenosis which was a bulky calcific plaque.  This was addressed with angioplasty and stenting as described above with an excellent result and no residual stenosis. 4.  Below that on the left there is plaque in the common femoral artery.  There is severe diffuse disease of the proximal superficial femoral artery which is then occluded in the proximal thigh.  There  is reconstitution of the popliteal artery at the level of the patella.  The below-knee popliteal artery, anterior tibial, tibial peroneal trunk, peroneal, and posterior tibial arteries are patent. 5.  On the right side there is a significant plaque in the common femoral artery.  The deep femoral artery is patent.  The proximal superficial femoral artery is patent.  The superficial femoral artery occlud es in the mid thigh and there is reconstitution of the popliteal artery at the level of the knee.  The below-knee popliteal artery is patent.  There is significant disease in the tibial peroneal trunk.  The anterior tibial, posterior tibial, and peroneal arteries are patent.  PRE: 90% left common iliac artery stenosis POST: 0% stenosis STENT: 7 mm X 29 mm VBX covered stent.   Deitra Mayo, MD, FACS [facility]  DATE OF DICTATION:   03/17/2017

## 2017-03-17 NOTE — Interval H&P Note (Signed)
History and Physical Interval Note:  03/17/2017 7:30 AM  Dennis Patel  has presented today for surgery, with the diagnosis of claudication  The various methods of treatment have been discussed with the patient and family. After consideration of risks, benefits and other options for treatment, the patient has consented to  Procedure(s): ABDOMINAL AORTOGRAM W/LOWER EXTREMITY (N/A) as a surgical intervention .  The patient's history has been reviewed, patient examined, no change in status, stable for surgery.  I have reviewed the patient's chart and labs.  Questions were answered to the patient's satisfaction.     Deitra Mayo

## 2017-03-17 NOTE — Progress Notes (Addendum)
Site area: Left groin a 7 french arterial long sheath was removed  Site Prior to Removal:  Level 0  Pressure Applied For 30 MINUTES    Bedrest Beginning at 1110am  Manual:   Yes.    Patient Status During Pull:  stable  Post Pull Groin Site:  Level 0  Post Pull Instructions Given:  Yes.    Post Pull Pulses Present:  Yes.    Dressing Applied:  Yes.    Comments:  VS remain stable during sheath pull

## 2017-03-18 DIAGNOSIS — Z6832 Body mass index (BMI) 32.0-32.9, adult: Secondary | ICD-10-CM | POA: Diagnosis not present

## 2017-03-18 DIAGNOSIS — E7800 Pure hypercholesterolemia, unspecified: Secondary | ICD-10-CM | POA: Diagnosis not present

## 2017-03-18 DIAGNOSIS — E669 Obesity, unspecified: Secondary | ICD-10-CM | POA: Diagnosis not present

## 2017-03-18 DIAGNOSIS — N400 Enlarged prostate without lower urinary tract symptoms: Secondary | ICD-10-CM | POA: Diagnosis not present

## 2017-03-18 DIAGNOSIS — I70223 Atherosclerosis of native arteries of extremities with rest pain, bilateral legs: Secondary | ICD-10-CM | POA: Diagnosis not present

## 2017-03-18 DIAGNOSIS — Z87891 Personal history of nicotine dependence: Secondary | ICD-10-CM | POA: Diagnosis not present

## 2017-03-18 DIAGNOSIS — Z8249 Family history of ischemic heart disease and other diseases of the circulatory system: Secondary | ICD-10-CM | POA: Diagnosis not present

## 2017-03-18 DIAGNOSIS — Z7982 Long term (current) use of aspirin: Secondary | ICD-10-CM | POA: Diagnosis not present

## 2017-03-18 LAB — BASIC METABOLIC PANEL
Anion gap: 11 (ref 5–15)
BUN: 10 mg/dL (ref 6–20)
CHLORIDE: 107 mmol/L (ref 101–111)
CO2: 22 mmol/L (ref 22–32)
CREATININE: 1.08 mg/dL (ref 0.61–1.24)
Calcium: 8.8 mg/dL — ABNORMAL LOW (ref 8.9–10.3)
GFR calc Af Amer: >60 mL/min (ref >60)
GFR calc non Af Amer: >60 mL/min (ref >60)
GLUCOSE: 138 mg/dL — AB (ref 65–99)
POTASSIUM: 4 mmol/L (ref 3.5–5.1)
Sodium: 140 mmol/L (ref 135–145)

## 2017-03-18 NOTE — Progress Notes (Signed)
   Daily Progress Note   Assessment/Planning:   POD #1 s/p L CIA PTA+S   No obvious complications  Ok to D/C   Subjective  - 1 Day Post-Op   No events overnight   Objective   Vitals:   03/17/17 1310 03/17/17 1500 03/17/17 2130 03/18/17 0453  BP: 121/64 133/65 (!) 174/87 121/69  Pulse: 68 68 67   Resp: 18 18 17 18   Temp:   97.9 F (36.6 C) 98.4 F (36.9 C)  TempSrc:   Oral Oral  SpO2: 99% 97% 99% 96%  Weight:      Height:         Intake/Output Summary (Last 24 hours) at 03/18/2017 0848 Last data filed at 03/17/2017 1828 Gross per 24 hour  Intake 720 ml  Output 300 ml  Net 420 ml    PULM  CTAB  CV  RRR  GI  soft, NTND  VASC B groins bandaged without active bleeding, mild L groin echymosis, both femoral palpable, both feet viable  NEURO DNVI    Laboratory   CBC CBC Latest Ref Rng & Units 03/17/2017  Hemoglobin 13.0 - 17.0 g/dL 14.6  Hematocrit 39.0 - 52.0 % 43.0    BMET    Component Value Date/Time   NA 140 03/18/2017 0441   K 4.0 03/18/2017 0441   CL 107 03/18/2017 0441   CO2 22 03/18/2017 0441   GLUCOSE 138 (H) 03/18/2017 0441   BUN 10 03/18/2017 0441   CREATININE 1.08 03/18/2017 0441   CALCIUM 8.8 (L) 03/18/2017 0441   GFRNONAA >60 03/18/2017 0441   GFRAA >60 03/18/2017 0441     Adele Barthel, MD, FACS [facility] [place] Office: [phone] Pager: [phone]  03/18/2017, 8:48 AM

## 2017-03-20 NOTE — Op Note (Signed)
PATIENT: Dennis Patel                                                                MRN: [id number] DOB: Mar 27, 1950                                            DATE OF PROCEDURE: 03/17/2017  INDICATIONS:    Dennis Patel is a 67 y.o. male who presented with bilateral lower extremity claudication.  We discussed conservative treatment however he felt strongly that he wanted to pursue further workup and possible intervention.  He presents for arteriography and possible intervention.  PROCEDURE:    1.  Conscious sedation 2.  Ultrasound-guided access to the right common femoral artery 3.  Ultrasound-guided access to the left common femoral artery 4.  Aortogram and bilateral iliac arteriogram 5.  Angioplasty and stenting of the left common iliac artery (7 mm x 29 mm VBX covered stent) 6.  Retrograde right femoral arteriogram 7.  Retrograde left femoral arteriogram  SURGEON: Judeth Cornfield. Scot Dock, MD, FACS  ANESTHESIA: Local with sedation  EBL: Minimal  TECHNIQUE: The patient was brought to the peripheral vascular lab and was sedated. The period of conscious sedation was 85 minutes.  During that time period, I was present face-to-face 100% of the time.  The patient was administered 1 mg of Versed and 50 mcg of fentanyl.  He received additional doses throughout the case. The patient's heart rate, blood pressure, and oxygen saturation were monitored by the nurse continuously during the procedure.  Under ultrasound guidance, after the skin was anesthetized, I cannulated the right common femoral artery with a micropuncture needle and a micropuncture sheath was introduced over the wire.  This was exchanged for a 5 [place] sheath over a Bentson wire.  A pigtail catheter was positioned at the L1 vertebral body and flush aortogram obtained.  The catheter was then positioned above the aortic bifurcation and an oblique iliac projection was obtained.  He was found to have a 90% left common  iliac artery stenosis which was a bulky calcific plaque.  I felt that we could potentially address this with angioplasty and stenting.  On the left side the skin was anesthetized.  The artery was calcified and the patient had significant obesity but I was able to identify a location in the artery are felt we could safely cannulate.  I felt this was just below the inguinal ligament.  The artery was cannulated with a micropuncture needle and a micropuncture sheath introduced over the wire.  We had difficult time passing the 6 French sheath and therefore I exchanged for a Rosen wire and dilated the tract.  Again there was difficulty in passing the 6 [place] sheath.  Therefore he went to an Amplatz wire.  The tract was dilated and I was able to get the 6 [place] destination sheath into the common iliac artery on the left.  Arteriogram was obtained to demonstrate the stenosis.  I selected a 7 mm x 29 mm covered stent.  This required a 7 [place] sheath so the 6 [place] sheath was exchanged for a 7 [place] Brite-tip sheath.  The  patient was heparinized.  ACT was 230.  The 7 mm x 29 mm VBX stent was deployed without difficulty.  Completion film showed an excellent result with no residual stenosis.  Next a retrograde right femoral arteriogram was obtained through the right femoral sheath.  A left femoral arteriogram was obtained to the left femoral sheath.  The patient was transferred to the holding area for removal of the sheath once the ACT was adequate.   FINDINGS:   1.  Single renal arteries bilaterally with no significant renal artery stenosis identified. 2.  The infrarenal aorta is widely patent. 3.  On the left side, there was a 90% left common iliac artery stenosis which was a bulky calcific plaque.  This was addressed with angioplasty and stenting as described above with an excellent result and no residual stenosis. 4.  Below that on the left there is plaque in the common femoral artery.  There is severe  diffuse disease of the proximal superficial femoral artery which is then occluded in the proximal thigh.  There is reconstitution of the popliteal artery at the level of the patella.  The below-knee popliteal artery, anterior tibial, tibial peroneal trunk, peroneal, and posterior tibial arteries are patent. 5.  On the right side there is a significant plaque in the common femoral artery.  The deep femoral artery is patent.  The proximal superficial femoral artery is patent.  The superficial femoral artery occlud es in the mid thigh and there is reconstitution of the popliteal artery at the level of the knee.  The below-knee popliteal artery is patent.  There is significant disease in the tibial peroneal trunk.  The anterior tibial, posterior tibial, and peroneal arteries are patent.  PRE: 90% left common iliac artery stenosis POST: 0% stenosis STENT: 7 mm X 29 mm VBX covered stent.   Deitra Mayo, MD, FACS [facility]

## 2017-04-26 ENCOUNTER — Encounter: Payer: Self-pay | Admitting: Vascular Surgery

## 2017-04-26 ENCOUNTER — Other Ambulatory Visit: Payer: Self-pay

## 2017-04-26 ENCOUNTER — Ambulatory Visit (INDEPENDENT_AMBULATORY_CARE_PROVIDER_SITE_OTHER): Payer: Medicare Other | Admitting: Vascular Surgery

## 2017-04-26 VITALS — BP 137/79 | HR 67 | Resp 20 | Ht 70.0 in | Wt 225.0 lb

## 2017-04-26 DIAGNOSIS — I70219 Atherosclerosis of native arteries of extremities with intermittent claudication, unspecified extremity: Secondary | ICD-10-CM

## 2017-04-26 NOTE — Progress Notes (Signed)
Patient name: Dennis Patel MRN: [id number] DOB: 10-17-1950 Sex: male  REASON FOR VISIT:   Follow-up after arteriography and angioplasty and stenting of the left common iliac artery  HPI:   Dennis Patel is a pleasant 67 y.o. male who presented with bilateral lower extremity claudication.  We discussed conservative treatment however he felt strongly about wanting to pursue possible intervention.  On 03/17/2017 he underwent an arteriogram with angioplasty and stenting of the left common iliac artery.  He had a 90% left common iliac artery stenosis with no residual stenosis at the completion of the procedure.  I used a 7 mm x 29 mm VBX covered stent.  The patient did have infrainguinal arterial occlusive disease bilaterally and was not a good candidate for endovascular approach for his infrainguinal disease.  Since I saw him last he has been walking for 3 miles every day.  He is able to walk on the treadmill and seems quite motivated.  He begins experiencing symptoms at 1 minute however he is able to continue to walking as the symptoms are tolerable.  He gets pain in both calves brought on by ambulation and relieved with rest.  There are no other aggravating or alleviating factors.  He denies any history of rest pain or nonhealing ulcers.  He is not a smoker.  He is on aspirin.  Current Outpatient Medications  Medication Sig Dispense Refill  . aspirin EC 81 MG tablet Take 81 mg by mouth daily.    . Multiple Vitamin (MULITIVITAMIN WITH MINERALS) TABS Take 1 tablet by mouth daily.    Marland Kitchen acetaminophen (TYLENOL) 500 MG tablet Take 500 mg by mouth every 6 (six) hours as needed. For pain    . fish oil-omega-3 fatty acids 1000 MG capsule Take 1 g by mouth daily.      No current facility-administered medications for this visit.     REVIEW OF SYSTEMS:  [X]  denotes positive finding, [ ]  denotes negative finding Cardiac  Comments:  Chest pain or chest pressure:    Shortness of breath upon  exertion:    Short of breath when lying flat:    Irregular heart rhythm:    Constitutional    Fever or chills:     PHYSICAL EXAM:   Vitals:   04/26/17 1514 04/26/17 1517  BP: (!) 149/87 137/79  Pulse: 67   Resp: 20   SpO2: 96%   Weight: 225 lb (102.1 kg)   Height: 5\' 10"  (1.778 m)     GENERAL: The patient is a well-nourished male, in no acute distress. The vital signs are documented above. CARDIOVASCULAR: There is a regular rate and rhythm. PULMONARY: There is good air exchange bilaterally without wheezing or rales. VASCULAR: I do not detect carotid bruits. He has palpable femoral pulses bilaterally.  I cannot palpate pedal pulses.  He has monophasic Doppler signals in both feet.  DATA:   No new data  MEDICAL ISSUES:   PERIPHERAL VASCULAR DISEASE: The patient is undergone successful angioplasty and stenting of the left common iliac artery stenosis.  He does have bilateral superficial femoral artery occlusions.  He does not appear to be a good candidate for endovascular approach to this and would need bilateral femoral to below-knee popliteal artery bypass grafts.  Currently his claudication symptoms are stable.  He does not have rest pain.  He seems quite motivated.  He is not a smoker.  I did encourage him to continue with his walking program.  I have ordered  follow-up ABIs and a duplex of his left common iliac artery stent in 6 months and I will see him back at that time.  He knows to call sooner if he has problems.  Deitra Mayo [facility] [phone]

## 2017-05-25 DIAGNOSIS — M545 Low back pain: Secondary | ICD-10-CM | POA: Diagnosis not present

## 2017-06-02 DIAGNOSIS — R972 Elevated prostate specific antigen [PSA]: Secondary | ICD-10-CM | POA: Diagnosis not present

## 2017-06-07 DIAGNOSIS — R350 Frequency of micturition: Secondary | ICD-10-CM | POA: Diagnosis not present

## 2017-06-07 DIAGNOSIS — N401 Enlarged prostate with lower urinary tract symptoms: Secondary | ICD-10-CM | POA: Diagnosis not present

## 2017-06-07 DIAGNOSIS — R972 Elevated prostate specific antigen [PSA]: Secondary | ICD-10-CM | POA: Diagnosis not present

## 2017-07-10 ENCOUNTER — Encounter (HOSPITAL_COMMUNITY): Payer: Self-pay | Admitting: Emergency Medicine

## 2017-07-10 ENCOUNTER — Inpatient Hospital Stay (HOSPITAL_COMMUNITY)
Admission: EM | Admit: 2017-07-10 | Discharge: 2017-07-17 | DRG: 918 | Disposition: A | Discharge status: Home / Self Care | Payer: Medicare Other | Attending: Internal Medicine | Admitting: Internal Medicine

## 2017-07-10 DIAGNOSIS — Z79899 Other long term (current) drug therapy: Secondary | ICD-10-CM | POA: Diagnosis not present

## 2017-07-10 DIAGNOSIS — I10 Essential (primary) hypertension: Secondary | ICD-10-CM | POA: Diagnosis present

## 2017-07-10 DIAGNOSIS — Z95820 Peripheral vascular angioplasty status with implants and grafts: Secondary | ICD-10-CM | POA: Diagnosis not present

## 2017-07-10 DIAGNOSIS — E875 Hyperkalemia: Secondary | ICD-10-CM | POA: Diagnosis present

## 2017-07-10 DIAGNOSIS — T40602A Poisoning by unspecified narcotics, intentional self-harm, initial encounter: Secondary | ICD-10-CM | POA: Diagnosis present

## 2017-07-10 DIAGNOSIS — F332 Major depressive disorder, recurrent severe without psychotic features: Secondary | ICD-10-CM | POA: Diagnosis not present

## 2017-07-10 DIAGNOSIS — D72829 Elevated white blood cell count, unspecified: Secondary | ICD-10-CM | POA: Diagnosis not present

## 2017-07-10 DIAGNOSIS — Z7982 Long term (current) use of aspirin: Secondary | ICD-10-CM | POA: Diagnosis not present

## 2017-07-10 DIAGNOSIS — Z87891 Personal history of nicotine dependence: Secondary | ICD-10-CM | POA: Diagnosis not present

## 2017-07-10 DIAGNOSIS — F330 Major depressive disorder, recurrent, mild: Secondary | ICD-10-CM | POA: Diagnosis not present

## 2017-07-10 DIAGNOSIS — R40241 Glasgow coma scale score 13-15, unspecified time: Secondary | ICD-10-CM | POA: Diagnosis present

## 2017-07-10 DIAGNOSIS — Z8659 Personal history of other mental and behavioral disorders: Secondary | ICD-10-CM | POA: Diagnosis not present

## 2017-07-10 DIAGNOSIS — T50902A Poisoning by unspecified drugs, medicaments and biological substances, intentional self-harm, initial encounter: Secondary | ICD-10-CM | POA: Diagnosis not present

## 2017-07-10 DIAGNOSIS — I739 Peripheral vascular disease, unspecified: Secondary | ICD-10-CM | POA: Diagnosis present

## 2017-07-10 DIAGNOSIS — T1491XA Suicide attempt, initial encounter: Secondary | ICD-10-CM | POA: Diagnosis not present

## 2017-07-10 DIAGNOSIS — N400 Enlarged prostate without lower urinary tract symptoms: Secondary | ICD-10-CM | POA: Diagnosis present

## 2017-07-10 DIAGNOSIS — T402X2A Poisoning by other opioids, intentional self-harm, initial encounter: Principal | ICD-10-CM | POA: Diagnosis present

## 2017-07-10 DIAGNOSIS — R0689 Other abnormalities of breathing: Secondary | ICD-10-CM | POA: Diagnosis not present

## 2017-07-10 DIAGNOSIS — R0902 Hypoxemia: Secondary | ICD-10-CM | POA: Diagnosis not present

## 2017-07-10 DIAGNOSIS — F29 Unspecified psychosis not due to a substance or known physiological condition: Secondary | ICD-10-CM | POA: Diagnosis not present

## 2017-07-10 DIAGNOSIS — R402441 Other coma, without documented Glasgow coma scale score, or with partial score reported, in the field [EMT or ambulance]: Secondary | ICD-10-CM | POA: Diagnosis not present

## 2017-07-10 DIAGNOSIS — G4700 Insomnia, unspecified: Secondary | ICD-10-CM | POA: Diagnosis not present

## 2017-07-10 HISTORY — DX: Peripheral vascular disease, unspecified: I73.9

## 2017-07-10 MED ORDER — NALOXONE HCL 0.4 MG/ML IJ SOLN
0.4000 mg | INTRAMUSCULAR | Status: DC | PRN
  Filled 2017-07-10: qty 1

## 2017-07-10 NOTE — ED Triage Notes (Signed)
Per EMS, pt. From home reported to be suicidal, pt. Took 50 tabs of 15mg  Morphine IR and 25 tabs of 15mg  Extended release at 8150 this evening. Pt. Stated that he is tired of taking care of his wife who has dementia . Pt received 0.5mg  Narcan IV via EMS, and 4mg  IV Zofran. coopeative upon arrival to ED. No s/s of distress.

## 2017-07-10 NOTE — ED Provider Notes (Signed)
[place] DEPT Provider Note: Georgena Spurling, MD, FACEP  CSN: [id number] MRN: [id number] ARRIVAL: 07/10/17 at 2330 ROOM: [place]  Drug Overdose   HISTORY OF PRESENT ILLNESS  07/10/17 11:48 PM Dennis Patel is a 67 y.o. male who took 50 tablets of 15 mg of immediate release morphine, and 25 tablets of 15 mg extended release morphine, this evening about 6:15 PM in a suicide attempt.  The medications belonged to his wife who has dementia.  He states he is tired of having to care for her and feels that there is no way out hits the overdose.  He denies coingestants.  His wife called EMS after she found him to be lethargic but not unconscious.  He was given 0.5 mg of Narcan IV prior to arrival with improvement in his mental status.  He was also given IV Zofran.  His only current complaint is that he feels like he has been in a [employer].   Past Medical History:  Diagnosis Date  . Claudication ([place])   . Peripheral artery disease [facility])     Past Surgical History:  Procedure Laterality Date  . ILIAC ARTERY STENT      No family history on file.  Social History   Tobacco Use  . Smoking status: Former Smoker    Types: Cigarettes  . Smokeless tobacco: Never Used  Substance Use Topics  . Alcohol use: Never    Frequency: Never  . Drug use: Never    Prior to Admission medications   Medication Sig Start Date End Date Taking? Authorizing Provider  aspirin EC 81 MG tablet Take 81 mg by mouth daily.   Yes [provider]  finasteride (PROSCAR) 5 MG tablet Take 5 mg by mouth daily.   Yes [provider]  ibuprofen (ADVIL,MOTRIN) 200 MG tablet Take 400 mg by mouth every 6 (six) hours as needed for headache or moderate pain.   Yes [provider]  Multiple Vitamins-Minerals (ONE DAILY MULTIVITAMIN MEN PO) Take 1 tablet by mouth daily.   Yes [provider]    Allergies Patient has no known allergies.   REVIEW OF SYSTEMS    Negative except as noted here or in the History of Present Illness.   PHYSICAL EXAMINATION  Initial Vital Signs SpO2 98 %.  Examination General: Well-developed, well-nourished male in no acute distress; appearance consistent with age of record HENT: normocephalic; atraumatic Eyes: pupils equal, round and reactive to light; extraocular muscles intact Neck: supple Heart: regular rate and rhythm Lungs: clear to auscultation bilaterally Abdomen: soft; nondistended; nontender; bowel sounds present Extremities: No deformity; full range of motion; pulses normal Neurologic: Awake, alert and oriented; motor function intact in all extremities and symmetric; no facial droop Skin: Warm and dry Psychiatric: Flat affect; suicidal ideation   RESULTS  Summary of this visit's results, reviewed by myself:   EKG Interpretation  Date/Time:    Ventricular Rate:    PR Interval:    QRS Duration:   QT Interval:    QTC Calculation:   R Axis:     Text Interpretation:        Laboratory Studies: Results for orders placed or performed during the hospital encounter of 07/10/17 (from the past 24 hour(s))  Comprehensive metabolic panel     Status: Abnormal   Collection Time: 07/10/17 11:57 PM  Result Value Ref Range   Sodium 141 135 - 145 mmol/L   Potassium 4.4 3.5 - 5.1 mmol/L  Chloride 108 98 - 111 mmol/L   CO2 21 (L) 22 - 32 mmol/L   Glucose, Bld 236 (H) 70 - 99 mg/dL   BUN 18 8 - 23 mg/dL   Creatinine, Ser 1.23 0.61 - 1.24 mg/dL   Calcium 9.1 8.9 - 10.3 mg/dL   Total Protein 7.1 6.5 - 8.1 g/dL   Albumin 4.1 3.5 - 5.0 g/dL   AST 30 15 - 41 U/L   ALT 36 0 - 44 U/L   Alkaline Phosphatase 101 38 - 126 U/L   Total Bilirubin 0.4 0.3 - 1.2 mg/dL   GFR calc non Af Amer 59 (L) >60 mL/min   GFR calc Af Amer >60 >60 mL/min   Anion gap 12 5 - 15  CBC with Differential/Platelet     Status: Abnormal   Collection Time: 07/10/17 11:57 PM  Result Value Ref Range   WBC 22.8 (H) 4.0 - 10.5 K/uL    RBC 5.34 4.22 - 5.81 MIL/uL   Hemoglobin 16.7 13.0 - 17.0 g/dL   HCT 47.9 39.0 - 52.0 %   MCV 89.7 78.0 - 100.0 fL   MCH 31.3 26.0 - 34.0 pg   MCHC 34.9 30.0 - 36.0 g/dL   RDW 13.0 11.5 - 15.5 %   Platelets 238 150 - 400 K/uL   Neutrophils Relative % 88 %   Neutro Abs 20.2 (H) 1.7 - 7.7 K/uL   Lymphocytes Relative 5 %   Lymphs Abs 1.0 0.7 - 4.0 K/uL   Monocytes Relative 7 %   Monocytes Absolute 1.6 (H) 0.1 - 1.0 K/uL   Eosinophils Relative 0 %   Eosinophils Absolute 0.0 0.0 - 0.7 K/uL   Basophils Relative 0 %   Basophils Absolute 0.0 0.0 - 0.1 K/uL  Ethanol     Status: None   Collection Time: 07/10/17 11:57 PM  Result Value Ref Range   Alcohol, Ethyl (B) <29 <51 mg/dL  Salicylate level     Status: None   Collection Time: 07/10/17 11:57 PM  Result Value Ref Range   Salicylate Lvl <8.8 2.8 - 30.0 mg/dL  Acetaminophen level     Status: Abnormal   Collection Time: 07/10/17 11:57 PM  Result Value Ref Range   Acetaminophen (Tylenol), Serum <10 (L) 10 - 30 ug/mL   Imaging Studies: No results found.  ED COURSE and MDM  Nursing notes and initial vitals signs, including pulse oximetry, reviewed.  Vitals:   07/10/17 2329 07/10/17 2330 07/11/17 0023  BP:  138/73 (!) 172/81  Pulse:  70 67  Resp:  17 10  Temp:  (!) 97.3 F (36.3 C)   TempSrc:  Oral   SpO2: 98% 96% 96%   1:18 AM Poison control advises patient be observed for 24 hours.  Narcan drip has been ordered.  PROCEDURES    ED DIAGNOSES     ICD-10-CM   1. Suicide attempt by drug ingestion, initial encounter ([place]) T50.902A   2. Opioid overdose, intentional self-harm, initial encounter ([place]) T40.2X2A        Shanon Rosser, MD 07/11/17 0120

## 2017-07-10 NOTE — ED Notes (Signed)
Bed: WA09 Expected date:  Expected time:  Means of arrival:  Comments: EMS- suicidal attempt; narcan given

## 2017-07-11 ENCOUNTER — Encounter (HOSPITAL_COMMUNITY): Payer: Self-pay | Admitting: Emergency Medicine

## 2017-07-11 ENCOUNTER — Other Ambulatory Visit: Payer: Self-pay

## 2017-07-11 DIAGNOSIS — F332 Major depressive disorder, recurrent severe without psychotic features: Secondary | ICD-10-CM | POA: Diagnosis not present

## 2017-07-11 DIAGNOSIS — E875 Hyperkalemia: Secondary | ICD-10-CM | POA: Diagnosis not present

## 2017-07-11 DIAGNOSIS — T40602A Poisoning by unspecified narcotics, intentional self-harm, initial encounter: Secondary | ICD-10-CM | POA: Diagnosis not present

## 2017-07-11 DIAGNOSIS — T50902A Poisoning by unspecified drugs, medicaments and biological substances, intentional self-harm, initial encounter: Secondary | ICD-10-CM

## 2017-07-11 DIAGNOSIS — Z87891 Personal history of nicotine dependence: Secondary | ICD-10-CM | POA: Diagnosis not present

## 2017-07-11 DIAGNOSIS — D72829 Elevated white blood cell count, unspecified: Secondary | ICD-10-CM

## 2017-07-11 DIAGNOSIS — T1491XA Suicide attempt, initial encounter: Secondary | ICD-10-CM

## 2017-07-11 DIAGNOSIS — G4700 Insomnia, unspecified: Secondary | ICD-10-CM | POA: Diagnosis not present

## 2017-07-11 DIAGNOSIS — T402X2A Poisoning by other opioids, intentional self-harm, initial encounter: Secondary | ICD-10-CM | POA: Diagnosis not present

## 2017-07-11 DIAGNOSIS — I10 Essential (primary) hypertension: Secondary | ICD-10-CM | POA: Diagnosis not present

## 2017-07-11 LAB — SALICYLATE LEVEL: Salicylate Lvl: <7 mg/dL (ref 2.8–30.0)

## 2017-07-11 LAB — CBC WITH DIFFERENTIAL/PLATELET
BASOS PCT: 0 %
Basophils Absolute: 0 10*3/uL (ref 0.0–0.1)
Basophils Absolute: 0 10*3/uL (ref 0.0–0.1)
Basophils Relative: 0 %
EOS ABS: 0 10*3/uL (ref 0.0–0.7)
EOS PCT: 0 %
Eosinophils Absolute: 0 10*3/uL (ref 0.0–0.7)
Eosinophils Relative: 0 %
HCT: 47.9 % (ref 39.0–52.0)
HEMATOCRIT: 47.1 % (ref 39.0–52.0)
HEMOGLOBIN: 16 g/dL (ref 13.0–17.0)
Hemoglobin: 16.7 g/dL (ref 13.0–17.0)
LYMPHS ABS: 1 10*3/uL (ref 0.7–4.0)
Lymphocytes Relative: 4 %
Lymphocytes Relative: 5 %
Lymphs Abs: 0.7 10*3/uL (ref 0.7–4.0)
MCH: 30.7 pg (ref 26.0–34.0)
MCH: 31.3 pg (ref 26.0–34.0)
MCHC: 34 g/dL (ref 30.0–36.0)
MCHC: 34.9 g/dL (ref 30.0–36.0)
MCV: 89.7 fL (ref 78.0–100.0)
MCV: 90.2 fL (ref 78.0–100.0)
MONO ABS: 0.6 10*3/uL (ref 0.1–1.0)
MONOS PCT: 3 %
Monocytes Absolute: 1.6 10*3/uL — ABNORMAL HIGH (ref 0.1–1.0)
Monocytes Relative: 7 %
NEUTROS ABS: 16.9 10*3/uL — AB (ref 1.7–7.7)
NEUTROS PCT: 93 %
Neutro Abs: 20.2 10*3/uL — ABNORMAL HIGH (ref 1.7–7.7)
Neutrophils Relative %: 88 %
Platelets: 238 10*3/uL (ref 150–400)
Platelets: 262 10*3/uL (ref 150–400)
RBC: 5.22 MIL/uL (ref 4.22–5.81)
RBC: 5.34 MIL/uL (ref 4.22–5.81)
RDW: 13 % (ref 11.5–15.5)
RDW: 13.2 % (ref 11.5–15.5)
WBC: 18.2 10*3/uL — ABNORMAL HIGH (ref 4.0–10.5)
WBC: 22.8 10*3/uL — AB (ref 4.0–10.5)

## 2017-07-11 LAB — COMPREHENSIVE METABOLIC PANEL
ALK PHOS: 103 U/L (ref 38–126)
ALT: 34 U/L (ref 0–44)
ALT: 36 U/L (ref 0–44)
ANION GAP: 12 (ref 5–15)
AST: 25 U/L (ref 15–41)
AST: 30 U/L (ref 15–41)
Albumin: 4 g/dL (ref 3.5–5.0)
Albumin: 4.1 g/dL (ref 3.5–5.0)
Alkaline Phosphatase: 101 U/L (ref 38–126)
Anion gap: 10 (ref 5–15)
BUN: 18 mg/dL (ref 8–23)
BUN: 24 mg/dL — ABNORMAL HIGH (ref 8–23)
CALCIUM: 9.1 mg/dL (ref 8.9–10.3)
CO2: 21 mmol/L — AB (ref 22–32)
CO2: 25 mmol/L (ref 22–32)
CREATININE: 1.23 mg/dL (ref 0.61–1.24)
CREATININE: 1.26 mg/dL — AB (ref 0.61–1.24)
Calcium: 9.1 mg/dL (ref 8.9–10.3)
Chloride: 104 mmol/L (ref 98–111)
Chloride: 108 mmol/L (ref 98–111)
GFR, EST NON AFRICAN AMERICAN: 57 mL/min — AB (ref >60)
GFR, EST NON AFRICAN AMERICAN: 59 mL/min — AB (ref >60)
Glucose, Bld: 192 mg/dL — ABNORMAL HIGH (ref 70–99)
Glucose, Bld: 236 mg/dL — ABNORMAL HIGH (ref 70–99)
Potassium: 4.4 mmol/L (ref 3.5–5.1)
Potassium: 5 mmol/L (ref 3.5–5.1)
SODIUM: 141 mmol/L (ref 135–145)
Sodium: 139 mmol/L (ref 135–145)
TOTAL PROTEIN: 7.1 g/dL (ref 6.5–8.1)
Total Bilirubin: 0.4 mg/dL (ref 0.3–1.2)
Total Bilirubin: 0.8 mg/dL (ref 0.3–1.2)
Total Protein: 7.1 g/dL (ref 6.5–8.1)

## 2017-07-11 LAB — BASIC METABOLIC PANEL
Anion gap: 10 (ref 5–15)
BUN: 24 mg/dL — AB (ref 8–23)
CALCIUM: 9.1 mg/dL (ref 8.9–10.3)
CO2: 24 mmol/L (ref 22–32)
Chloride: 105 mmol/L (ref 98–111)
Creatinine, Ser: 1.3 mg/dL — ABNORMAL HIGH (ref 0.61–1.24)
GFR calc Af Amer: >60 mL/min (ref >60)
GFR, EST NON AFRICAN AMERICAN: 55 mL/min — AB (ref >60)
GLUCOSE: 224 mg/dL — AB (ref 70–99)
POTASSIUM: 5.6 mmol/L — AB (ref 3.5–5.1)
Sodium: 139 mmol/L (ref 135–145)

## 2017-07-11 LAB — HEPATIC FUNCTION PANEL
ALBUMIN: 4 g/dL (ref 3.5–5.0)
ALK PHOS: 100 U/L (ref 38–126)
ALT: 37 U/L (ref 0–44)
AST: 29 U/L (ref 15–41)
Bilirubin, Direct: 0.1 mg/dL (ref 0.0–0.2)
Indirect Bilirubin: 0.8 mg/dL (ref 0.3–0.9)
Total Bilirubin: 0.9 mg/dL (ref 0.3–1.2)
Total Protein: 7.1 g/dL (ref 6.5–8.1)

## 2017-07-11 LAB — RAPID URINE DRUG SCREEN, HOSP PERFORMED
Amphetamines: NOT DETECTED
Benzodiazepines: NOT DETECTED
COCAINE: NOT DETECTED
Opiates: POSITIVE — AB
Tetrahydrocannabinol: NOT DETECTED

## 2017-07-11 LAB — ACETAMINOPHEN LEVEL: Acetaminophen (Tylenol), Serum: <10 ug/mL — ABNORMAL LOW (ref 10–30)

## 2017-07-11 LAB — ETHANOL

## 2017-07-11 LAB — MRSA PCR SCREENING: MRSA by PCR: NEGATIVE

## 2017-07-11 MED ORDER — ONDANSETRON HCL 4 MG/2ML IJ SOLN: 4.0000 mg | Freq: Four times a day (QID) | INTRAMUSCULAR | Status: DC | PRN

## 2017-07-11 MED ORDER — ENOXAPARIN SODIUM 40 MG/0.4ML ~~LOC~~ SOLN
40.0000 mg | SUBCUTANEOUS | Status: DC
  Administered 2017-07-11 – 2017-07-17 (×7): 40 mg via SUBCUTANEOUS
  Filled 2017-07-11 (×7): qty 0.4

## 2017-07-11 MED ORDER — HYDROCORTISONE 1 % EX CREA: 1.0000 "application " | TOPICAL_CREAM | Freq: Three times a day (TID) | CUTANEOUS | Status: DC | PRN

## 2017-07-11 MED ORDER — ONDANSETRON HCL 4 MG PO TABS: 4.0000 mg | ORAL_TABLET | Freq: Four times a day (QID) | ORAL | Status: DC | PRN

## 2017-07-11 MED ORDER — ACETAMINOPHEN 325 MG PO TABS: 650.0000 mg | ORAL_TABLET | Freq: Four times a day (QID) | ORAL | Status: DC | PRN

## 2017-07-11 MED ORDER — ACETAMINOPHEN 650 MG RE SUPP: 650.0000 mg | Freq: Four times a day (QID) | RECTAL | Status: DC | PRN

## 2017-07-11 MED ORDER — ALUM & MAG HYDROXIDE-SIMETH 200-200-20 MG/5ML PO SUSP
30.0000 mL | ORAL | Status: DC | PRN
  Administered 2017-07-11 (×2): 30 mL via ORAL
  Filled 2017-07-11 (×2): qty 30

## 2017-07-11 MED ORDER — FAMOTIDINE 20 MG PO TABS
20.0000 mg | ORAL_TABLET | Freq: Two times a day (BID) | ORAL | Status: DC
  Administered 2017-07-11 – 2017-07-17 (×13): 20 mg via ORAL
  Filled 2017-07-11 (×13): qty 1

## 2017-07-11 MED ORDER — METOPROLOL TARTRATE 5 MG/5ML IV SOLN
2.5000 mg | INTRAVENOUS | Status: DC | PRN
  Administered 2017-07-11: 2.5 mg via INTRAVENOUS
  Filled 2017-07-11: qty 5

## 2017-07-11 MED ORDER — NALOXONE HCL 4 MG/10ML IJ SOLN
0.2500 mg/h | INTRAMUSCULAR | Status: DC
  Administered 2017-07-11 (×2): 0.25 mg/h via INTRAVENOUS
  Filled 2017-07-11: qty 10
  Filled 2017-07-11: qty 4
  Filled 2017-07-11: qty 10

## 2017-07-11 MED ORDER — AMLODIPINE BESYLATE 10 MG PO TABS
10.0000 mg | ORAL_TABLET | Freq: Every day | ORAL | Status: DC
  Administered 2017-07-11 – 2017-07-17 (×7): 10 mg via ORAL
  Filled 2017-07-11 (×7): qty 1

## 2017-07-11 MED ORDER — HYDRALAZINE HCL 20 MG/ML IJ SOLN
5.0000 mg | INTRAMUSCULAR | Status: DC | PRN
  Administered 2017-07-11 (×2): 5 mg via INTRAVENOUS
  Filled 2017-07-11 (×2): qty 1

## 2017-07-11 NOTE — H&P (Signed)
History and Physical    Dennis Patel PPI:[id number] DOB: Jan 12, 1951 DOA: 07/10/2017  PCP: [name], [name], MD  Patient coming from: Home  I have personally briefly reviewed patient's old medical records in [place]  Chief Complaint: OD  HPI: Dennis Patel is a 67 y.o. male with medical history significant of PAD.  Patient apparently tried to commit suicide yesterday evening around 6:15pm by taking  50 tablets of 15 mg of immediate release morphine, and 25 tablets of 15 mg extended release morphine.  Meds belong to his wife who has dementia.  He is her caregiver.  He feels there is no way out other than to kill himself by overdosing.  Wife found him lethargic and called EMS.  ED Course: Patient improved post narcan and now on narcan gtt.   Review of Systems: As per HPI otherwise 10 point review of systems negative.   Past Medical History:  Diagnosis Date  . Claudication ([place])   . Peripheral artery disease [facility])     Past Surgical History:  Procedure Laterality Date  . ILIAC ARTERY STENT       reports that he has quit smoking. His smoking use included cigarettes. He has never used smokeless tobacco. He reports that he does not drink alcohol or use drugs.  No Known Allergies  No family history on file.   Prior to Admission medications   Medication Sig Start Date End Date Taking? Authorizing Provider  aspirin EC 81 MG tablet Take 81 mg by mouth daily.   Yes [provider]  finasteride (PROSCAR) 5 MG tablet Take 5 mg by mouth daily.   Yes [provider]  ibuprofen (ADVIL,MOTRIN) 200 MG tablet Take 400 mg by mouth every 6 (six) hours as needed for headache or moderate pain.   Yes [provider]  Multiple Vitamins-Minerals (ONE DAILY MULTIVITAMIN MEN PO) Take 1 tablet by mouth daily.   Yes [provider]    Physical Exam: Vitals:   07/11/17 0245 07/11/17 0300 07/11/17 0330 07/11/17 0345  BP:  (!) 153/68 134/85   Pulse:  65 62 69 75  Resp: 11 11 11 10   Temp:      TempSrc:      SpO2: 97% 95% 96% 98%    Constitutional: NAD, calm, comfortable Eyes: PERRL, lids and conjunctivae normal ENMT: Mucous membranes are moist. Posterior pharynx clear of any exudate or lesions.Normal dentition.  Neck: normal, supple, no masses, no thyromegaly Respiratory: clear to auscultation bilaterally, no wheezing, no crackles. Normal respiratory effort. No accessory muscle use.  Cardiovascular: Regular rate and rhythm, no murmurs / rubs / gallops. No extremity edema. 2+ pedal pulses. No carotid bruits.  Abdomen: no tenderness, no masses palpated. No hepatosplenomegaly. Bowel sounds positive.  Musculoskeletal: no clubbing / cyanosis. No joint deformity upper and lower extremities. Good ROM, no contractures. Normal muscle tone.  Skin: no rashes, lesions, ulcers. No induration Neurologic: CN 2-12 grossly intact. Sensation intact, DTR normal. Strength 5/5 in all 4.  Psychiatric: Normal judgment and insight. Alert and oriented x 3. Normal mood.    Labs on Admission: I have personally reviewed following labs and imaging studies  CBC: Recent Labs  Lab 07/10/17 2357  WBC 22.8*  NEUTROABS 20.2*  HGB 16.7  HCT 47.9  MCV 89.7  PLT 063   Basic Metabolic Panel: Recent Labs  Lab 07/10/17 2357  NA 141  K 4.4  CL 108  CO2 21*  GLUCOSE 236*  BUN 18  CREATININE 1.23  CALCIUM 9.1   GFR: CrCl cannot be calculated (Unknown ideal weight.). Liver Function Tests: Recent Labs  Lab 07/10/17 2357  AST 30  ALT 36  ALKPHOS 101  BILITOT 0.4  PROT 7.1  ALBUMIN 4.1   No results for input(s): LIPASE, AMYLASE in the last 168 hours. No results for input(s): AMMONIA in the last 168 hours. Coagulation Profile: No results for input(s): INR, PROTIME in the last 168 hours. Cardiac Enzymes: No results for input(s): CKTOTAL, CKMB, CKMBINDEX, TROPONINI in the last 168 hours. BNP (last 3 results) No results for input(s): PROBNP in the  last 8760 hours. HbA1C: No results for input(s): HGBA1C in the last 72 hours. CBG: No results for input(s): GLUCAP in the last 168 hours. Lipid Profile: No results for input(s): CHOL, HDL, LDLCALC, TRIG, CHOLHDL, LDLDIRECT in the last 72 hours. Thyroid Function Tests: No results for input(s): TSH, T4TOTAL, FREET4, T3FREE, THYROIDAB in the last 72 hours. Anemia Panel: No results for input(s): VITAMINB12, FOLATE, FERRITIN, TIBC, IRON, RETICCTPCT in the last 72 hours. Urine analysis: No results found for: COLORURINE, APPEARANCEUR, LABSPEC, PHURINE, GLUCOSEU, HGBUR, BILIRUBINUR, KETONESUR, PROTEINUR, UROBILINOGEN, NITRITE, LEUKOCYTESUR  Radiological Exams on Admission: No results found.  EKG: Independently reviewed.  Assessment/Plan Active Problems:   Opiate overdose, intentional self-harm, initial encounter ([place])    1. Opiate overdose - 1. Narcan gtt 2. Suicide precautions 3. Call psych in AM  DVT prophylaxis: Lovenox Code Status: Full Family Communication: No family in room Disposition Plan: TBD Consults called: None, call psych in AM Admission status: Place in [place], [place] Hospitalists Pager [phone] Only works nights!  If 7AM-7PM, please contact the primary day team physician taking care of patient  [email or web address] Password [email or web address]  07/11/2017, 4:20 AM

## 2017-07-11 NOTE — Progress Notes (Signed)
BP 200s/100s. MD made aware. Awaiting orders. Will continue to monitor patient closely.

## 2017-07-11 NOTE — ED Notes (Signed)
Called POISON CONTROL and spoke to Ms. Dennis Patel with recommendations; to continue monitoring/observation; pt for CNS depression x 24 hours for possible side effects of Morphine Extended Release. If pt. Needs to continue narcan, Narcan Drip is ok but has to be discontinued 8 hours prior to discharge. To notify MD.

## 2017-07-11 NOTE — Consult Note (Addendum)
[facility]iat[facility] Consult   Reason for Consult:  Suicide attempt  Referring Physician:  Dr. Starla Link Patient Identification: Dennis Patel MRN:  [id number] Principal Diagnosis: MDD (major depressive disorder), recurrent severe, without psychosis ([place]) Diagnosis:   Patient Active Problem List   Diagnosis Date Noted  . Opiate overdose, intentional self-harm, initial encounter [facility]) [T40.602A] 07/11/2017    Total Time spent with patient: 1 hour  Subjective:   Dennis Patel is a 67 y.o. male patient admitted with suicide attempt by drug overdose.  HPI:   Per chart review, patient was admitted with suicide attempt by drug overdose. He took immediate release Morphine 15 mg tablets (#50) and extended release Morphine 15 mg tablets (#25). His wife found him lethargic and called EMS. He is receiving a Narcan drip in the hospital. The Morphine he ingested is prescribed for his wife. He is her caregiver and reports caregiver burnout.   On interview, Dennis Patel reports feeling depressed and overwhelmed with caring for his wife who has dementia.  He is her sole caregiver for the past 3.5 years.  He reports that her health has been gradually worsening.  He has been unable to participate in activities such as golfing that bring him joy due to caring for his wife and physical limitations due to peripheral vascular disease.  He reports a lack of sleep due to erratic sleeping patterns with his wife.  She has sundowning.  He has felt "mentally challenged" and hopeless.  He reports joking to his friends that he would overdose on his wife's medication several months ago but recently he began considering it more seriously.  This past weekend he felt extremely stressed due to caring for his wife.  He decided to overdose on her Morphine.  He has been keeping the medication that she misses in a separate bottle for the past year in case she needs it later due to not being able to make a doctor's appointment.   He went to the patio at their home and took several pills with the intention to end his life.  His wife found him and called 911.  He reports that he is glad that his attempt was unsuccessful.  He denies current SI, HI or AVH.  He denies access to weapons or guns at home.  Past Psychiatric History: Depression and alcohol abuse   Risk to Self:  Yes given suicide attempt.  Risk to Others:  None. Denies HI.  Prior Inpatient Therapy:  Denies  Prior Outpatient Therapy:  Wellbutrin (caused cognitive deficits) and Prozac made him feel "weird."  Past Medical History:  Past Medical History:  Diagnosis Date  . Claudication ([place])   . Peripheral artery disease [facility])     Past Surgical History:  Procedure Laterality Date  . ILIAC ARTERY STENT     Family History: History reviewed. No pertinent family history. Family Psychiatric  History: Denies  Social History:  Social History   Substance and Sexual Activity  Alcohol Use Never  . Frequency: Never     Social History   Substance and Sexual Activity  Drug Use Never    Social History   Socioeconomic History  . Marital status: Married    Spouse name: Not on file  . Number of children: Not on file  . Years of education: Not on file  . Highest education level: Not on file  Occupational History  . Not on file  Social Needs  . Financial resource strain: Not on file  .  Food insecurity:    Worry: Not on file    Inability: Not on file  . Transportation needs:    Medical: Not on file    Non-medical: Not on file  Tobacco Use  . Smoking status: Former Smoker    Types: Cigarettes  . Smokeless tobacco: Never Used  Substance and Sexual Activity  . Alcohol use: Never    Frequency: Never  . Drug use: Never  . Sexual activity: Not on file  Lifestyle  . Physical activity:    Days per week: Not on file    Minutes per session: Not on file  . Stress: Not on file  Relationships  . Social connections:    Talks on phone: Not on file    Gets  together: Not on file    Attends religious service: Not on file    Active member of club or organization: Not on file    Attends meetings of clubs or organizations: Not on file    Relationship status: Not on file  Other Topics Concern  . Not on file  Social History Narrative  . Not on file   Additional Social History: He lives at home with his wife. She has dementia and he is her caregiver. He has 2 adult sons and 2 stepchildren. He is retired from [employer] since October 2018. He is sober from alcohol use for several years. He is active with AA groups and leadership positions. He denies illicit substance use.     Allergies:  No Known Allergies  Labs:  Results for orders placed or performed during the hospital encounter of 07/10/17 (from the past 48 hour(s))  Comprehensive metabolic panel     Status: Abnormal   Collection Time: 07/10/17 11:57 PM  Result Value Ref Range   Sodium 141 135 - 145 mmol/L   Potassium 4.4 3.5 - 5.1 mmol/L   Chloride 108 98 - 111 mmol/L   CO2 21 (L) 22 - 32 mmol/L   Glucose, Bld 236 (H) 70 - 99 mg/dL   BUN 18 8 - 23 mg/dL   Creatinine, Ser 1.23 0.61 - 1.24 mg/dL   Calcium 9.1 8.9 - 10.3 mg/dL   Total Protein 7.1 6.5 - 8.1 g/dL   Albumin 4.1 3.5 - 5.0 g/dL   AST 30 15 - 41 U/L   ALT 36 0 - 44 U/L   Alkaline Phosphatase 101 38 - 126 U/L   Total Bilirubin 0.4 0.3 - 1.2 mg/dL   GFR calc non Af Amer 59 (L) >60 mL/min   GFR calc Af Amer >60 >60 mL/min    Comment: (NOTE) The eGFR has been calculated using the CKD EPI equation. This calculation has not been validated in all clinical situations. eGFR's persistently <60 mL/min signify possible Chronic Kidney Disease.    Anion gap 12 5 - 15    Comment: Performed at [facility], [place] [address] [place], [place] [address]  CBC with Differential/Platelet     Status: Abnormal   Collection Time: 07/10/17 11:57 PM  Result Value Ref Range   WBC 22.8 (H) 4.0 - 10.5 K/uL   RBC 5.34 4.22 - 5.81 MIL/uL    Hemoglobin 16.7 13.0 - 17.0 g/dL   HCT 47.9 39.0 - 52.0 %   MCV 89.7 78.0 - 100.0 fL   MCH 31.3 26.0 - 34.0 pg   MCHC 34.9 30.0 - 36.0 g/dL   RDW 13.0 11.5 - 15.5 %   Platelets 238 150 - 400 K/uL  Neutrophils Relative % 88 %   Neutro Abs 20.2 (H) 1.7 - 7.7 K/uL   Lymphocytes Relative 5 %   Lymphs Abs 1.0 0.7 - 4.0 K/uL   Monocytes Relative 7 %   Monocytes Absolute 1.6 (H) 0.1 - 1.0 K/uL   Eosinophils Relative 0 %   Eosinophils Absolute 0.0 0.0 - 0.7 K/uL   Basophils Relative 0 %   Basophils Absolute 0.0 0.0 - 0.1 K/uL    Comment: Performed at [facility], [place] [address] [place], [place] [address]  Ethanol     Status: None   Collection Time: 07/10/17 11:57 PM  Result Value Ref Range   Alcohol, Ethyl (B) <10 <10 mg/dL    Comment: (NOTE) Lowest detectable limit for serum alcohol is 10 mg/dL. For medical purposes only. Performed at [facility], [place] [address] [place], [place] [address]   Salicylate level     Status: None   Collection Time: 07/10/17 11:57 PM  Result Value Ref Range   Salicylate Lvl <9.0 2.8 - 30.0 mg/dL    Comment: Performed at [facility], [place] [address], [place], [place] [address]  Acetaminophen level     Status: Abnormal   Collection Time: 07/10/17 11:57 PM  Result Value Ref Range   Acetaminophen (Tylenol), Serum <10 (L) 10 - 30 ug/mL    Comment: (NOTE) Therapeutic concentrations vary significantly. A range of 10-30 ug/mL  may be an effective concentration for many patients. However, some  are best treated at concentrations outside of this range. Acetaminophen concentrations >150 ug/mL at 4 hours after ingestion  and >50 ug/mL at 12 hours after ingestion are often associated with  toxic reactions. Performed at [facility], [place] [address] [place], [place] [address]   MRSA PCR Screening     Status: None   Collection Time: 07/11/17  5:28 AM  Result Value Ref Range    MRSA by PCR NEGATIVE NEGATIVE    Comment:        The GeneXpert MRSA Assay (FDA approved for NASAL specimens only), is one component of a comprehensive MRSA colonization surveillance program. It is not intended to diagnose MRSA infection nor to guide or monitor treatment for MRSA infections. Performed at [facility], [place] [address], [place], [place] [address]   Basic metabolic panel     Status: Abnormal   Collection Time: 07/11/17  6:44 AM  Result Value Ref Range   Sodium 139 135 - 145 mmol/L   Potassium 5.6 (H) 3.5 - 5.1 mmol/L    Comment: DELTA CHECK NOTED REPEATED TO VERIFY NO VISIBLE HEMOLYSIS    Chloride 105 98 - 111 mmol/L   CO2 24 22 - 32 mmol/L   Glucose, Bld 224 (H) 70 - 99 mg/dL   BUN 24 (H) 8 - 23 mg/dL   Creatinine, Ser 1.30 (H) 0.61 - 1.24 mg/dL   Calcium 9.1 8.9 - 10.3 mg/dL   GFR calc non Af Amer 55 (L) >60 mL/min   GFR calc Af Amer >60 >60 mL/min    Comment: (NOTE) The eGFR has been calculated using the CKD EPI equation. This calculation has not been validated in all clinical situations. eGFR's persistently <60 mL/min signify possible Chronic Kidney Disease.    Anion gap 10 5 - 15    Comment: Performed at [facility], [place] [address], [place], [place] [address]  CBC with Differential/Platelet     Status: Abnormal   Collection Time:  07/11/17  6:44 AM  Result Value Ref Range   WBC 18.2 (H) 4.0 - 10.5 K/uL   RBC 5.22 4.22 - 5.81 MIL/uL   Hemoglobin 16.0 13.0 - 17.0 g/dL   HCT 47.1 39.0 - 52.0 %   MCV 90.2 78.0 - 100.0 fL   MCH 30.7 26.0 - 34.0 pg   MCHC 34.0 30.0 - 36.0 g/dL   RDW 13.2 11.5 - 15.5 %   Platelets 262 150 - 400 K/uL   Neutrophils Relative % 93 %   Neutro Abs 16.9 (H) 1.7 - 7.7 K/uL   Lymphocytes Relative 4 %   Lymphs Abs 0.7 0.7 - 4.0 K/uL   Monocytes Relative 3 %   Monocytes Absolute 0.6 0.1 - 1.0 K/uL   Eosinophils Relative 0 %   Eosinophils Absolute 0.0 0.0 - 0.7 K/uL   Basophils Relative 0 %    Basophils Absolute 0.0 0.0 - 0.1 K/uL    Comment: Performed at [facility], [place] [address], [place], [place] [address]  Hepatic function panel     Status: None   Collection Time: 07/11/17  6:44 AM  Result Value Ref Range   Total Protein 7.1 6.5 - 8.1 g/dL   Albumin 4.0 3.5 - 5.0 g/dL   AST 29 15 - 41 U/L   ALT 37 0 - 44 U/L    Comment: Please note change in reference range.   Alkaline Phosphatase 100 38 - 126 U/L   Total Bilirubin 0.9 0.3 - 1.2 mg/dL   Bilirubin, Direct 0.1 0.0 - 0.2 mg/dL    Comment: Please note change in reference range.   Indirect Bilirubin 0.8 0.3 - 0.9 mg/dL    Comment: Performed at [facility], [place] [address] [place], [place] [address]  Comprehensive metabolic panel     Status: Abnormal   Collection Time: 07/11/17 10:43 AM  Result Value Ref Range   Sodium 139 135 - 145 mmol/L   Potassium 5.0 3.5 - 5.1 mmol/L   Chloride 104 98 - 111 mmol/L    Comment: Please note change in reference range.   CO2 25 22 - 32 mmol/L   Glucose, Bld 192 (H) 70 - 99 mg/dL    Comment: Please note change in reference range.   BUN 24 (H) 8 - 23 mg/dL    Comment: Please note change in reference range.   Creatinine, Ser 1.26 (H) 0.61 - 1.24 mg/dL   Calcium 9.1 8.9 - 10.3 mg/dL   Total Protein 7.1 6.5 - 8.1 g/dL   Albumin 4.0 3.5 - 5.0 g/dL   AST 25 15 - 41 U/L   ALT 34 0 - 44 U/L    Comment: Please note change in reference range.   Alkaline Phosphatase 103 38 - 126 U/L   Total Bilirubin 0.8 0.3 - 1.2 mg/dL   GFR calc non Af Amer 57 (L) >60 mL/min   GFR calc Af Amer >60 >60 mL/min    Comment: (NOTE) The eGFR has been calculated using the CKD EPI equation. This calculation has not been validated in all clinical situations. eGFR's persistently <60 mL/min signify possible Chronic Kidney Disease.    Anion gap 10 5 - 15    Comment: Performed at [facility], [place] [address] [place], [place] [address]    Current  Facility-Administered Medications  Medication Dose Route Frequency Provider Last Rate Last Dose  . acetaminophen (TYLENOL) tablet 650 mg  650 mg Oral Q6H PRN Jennette Kettle  M, DO       Or  . acetaminophen (TYLENOL) suppository 650 mg  650 mg Rectal Q6H PRN Etta Quill, DO      . alum & mag hydroxide-simeth (MAALOX/MYLANTA) 200-200-20 MG/5ML suspension 30 mL  30 mL Oral Q4H PRN Aline August, MD   30 mL at 07/11/17 0812  . amLODipine (NORVASC) tablet 10 mg  10 mg Oral Daily Aline August, MD   10 mg at 07/11/17 0925  . enoxaparin (LOVENOX) injection 40 mg  40 mg Subcutaneous Q24H Alcario Drought, Jared M, DO   40 mg at 07/11/17 6945  . hydrALAZINE (APRESOLINE) injection 5 mg  5 mg Intravenous Q4H PRN Aline August, MD   5 mg at 07/11/17 0926  . hydrocortisone cream 1 % 1 application  1 application Topical TID PRN Aline August, MD      . metoprolol tartrate (LOPRESSOR) injection 2.5 mg  2.5 mg Intravenous Q4H PRN Aline August, MD      . naloxone (NARCAN) injection 0.4 mg  0.4 mg Intravenous PRN Molpus, John, MD      . naloxone HCl (NARCAN) 4 mg in dextrose 5 % 250 mL infusion  0.25 mg/hr Intravenous Continuous Molpus, John, MD 15.6 mL/hr at 07/11/17 0600 0.25 mg/hr at 07/11/17 0600  . ondansetron (ZOFRAN) tablet 4 mg  4 mg Oral Q6H PRN Etta Quill, DO       Or  . ondansetron [facility]) injection 4 mg  4 mg Intravenous Q6H PRN Etta Quill, DO        Musculoskeletal: Strength & Muscle Tone: within normal limits Gait & Station: UTA since patient was lying in bed. Patient leans: N/A  Psychiatric Specialty Exam: Physical Exam  Nursing note and vitals reviewed. Constitutional: He is oriented to person, place, and time. He appears well-developed and well-nourished.  HENT:  Head: Normocephalic and atraumatic.  Neck: Normal range of motion.  Respiratory: Effort normal.  Musculoskeletal: Normal range of motion.  Neurological: He is alert and oriented to person, place, and time.   Skin: No rash noted.  Psychiatric: His speech is normal and behavior is normal. Judgment and thought content normal. Cognition and memory are normal. He exhibits a depressed mood.    Review of Systems  Cardiovascular: Negative for chest pain.  Gastrointestinal: Positive for abdominal pain, heartburn and nausea. Negative for constipation, diarrhea and vomiting.  Psychiatric/Behavioral: Positive for depression. Negative for hallucinations, substance abuse and suicidal ideas. The patient has insomnia.   All other systems reviewed and are negative.   Blood pressure (!) 170/73, pulse 69, temperature 97.7 F (36.5 C), temperature source Oral, resp. rate 12, height '5\' 7"'  (1.702 m), weight 97.5 kg (214 lb 15.2 oz), SpO2 95 %.Body mass index is 33.67 kg/m.  General Appearance: Fairly Groomed, obese, elderly, Caucasian male, wearing a hospital gown with gray thinning hair and [place] in place who is lying in bed. NAD.   Eye Contact:  Good  Speech:  Clear and Coherent and Normal Rate  Volume:  Normal  Mood:  Depressed  Affect:  Congruent  Thought Process:  Goal Directed, Linear and Descriptions of Associations: Intact  Orientation:  Full (Time, Place, and Person)  Thought Content:  Logical  Suicidal Thoughts:  No  Homicidal Thoughts:  No  Memory:  Immediate;   Good Recent;   Good Remote;   Fair  Judgement:  Fair  Insight:  Fair  Psychomotor Activity:  Normal  Concentration:  Concentration: Good and Attention Span: Good  Recall:  Good  Fund of Knowledge:  Good  Language:  Good  Akathisia:  No  Handed:  Right  AIMS (if indicated):   N/A  Assets:  Communication Skills Desire for Improvement Housing Social Support  ADL's:  Intact  Cognition:  WNL  Sleep:   Poor   Assessment:  Dennis Patel is a 67 y.o. male who was admitted with suicide attempt by Morphine overdose. He reports depressive symptoms in the setting of being the sole caregiver for his wife who has dementia and managing his  own medical problems. He warrants inpatient psychiatric hospitalization for stabilization and treatment.   Treatment Plan Summary: -Patient warrants inpatient psychiatric hospitalization given high risk of harm to self. -Continue bedside sitter.  -Will defer medication management to inpatient psychiatric team given patient is wary to start a medication due to past side effects with antidepressants.  -Please pursue involuntary commitment if patient refuses voluntary psychiatric hospitalization or attempts to leave the hospital.  -Will sign off on patient at this time. Please consult psychiatry again as needed.    Disposition: Recommend psychiatric Inpatient admission when medically cleared.  Faythe Dingwall, DO 07/11/2017 11:33 AM

## 2017-07-11 NOTE — Progress Notes (Signed)
   07/11/17 1400  Clinical Encounter Type  Visited With Patient and family together  Visit Type Initial;Psychological support;Spiritual support  Referral From Nurse  Consult/Referral To Chaplain  Spiritual Encounters  Spiritual Needs Emotional;Other (Comment) (Spiritual Care Conversation/Support )  Stress Factors  Patient Stress Factors Family relationships;Health changes;Major life changes   I visited with the patient per Spiritual Care consult.  The patient was very receptive to my visit. The patient explained that he has been under a great dal of stress due to his wife's dementia, and being her primary caregiver. He said that the stress had been building up since the first of the year.  The patient shared that he has been sober for over 20 years and that prayer, meditation and hope are the three things that get him through life. He states that he has lost hope and is dealing with questions about "where God is."  The patient went on a trip to see his sons recently and told me how happy that trip was. We discussed finding ways to implement more happy moments into his daily life.  The patient also discussed the possibility of having to put his wife into a nursing home, because her care is "getting too much."   I will follow up with this patient.   Please, contact Spiritual Care for further assistance.  Chaplain Shanon Ace M.Div., [facility]

## 2017-07-11 NOTE — ED Notes (Signed)
ED TO INPATIENT HANDOFF REPORT  Name/Age/Gender Dennis Patel 67 y.o. male  Code Status    Code Status Orders  (From admission, onward)        Start     Ordered   07/11/17 0405  Full code  Continuous     07/11/17 0406    Code Status History    This patient has a current code status but no historical code status.      Home/SNF/Other Home  Chief Complaint suicidal  Level of Care/Admitting Diagnosis ED Disposition    ED Disposition Condition Comment   Admit  Hospital Area: [facility] [[id number]]  Level of Care: Stepdown [14]  Admit to SDU based on following criteria: Other see comments  Comments: narcan gtt  Diagnosis: Opiate overdose, intentional self-harm, initial encounter [facility]) [[id number]]  Admitting Physician: Etta Quill [phone]  Attending Physician: Etta Quill [[id number]]  PT Class (Do Not Modify): Observation [104]  PT Acc Code (Do Not Modify): Observation [10022]       Medical History Past Medical History:  Diagnosis Date  . Claudication ([place])   . Peripheral artery disease (HCC)     Allergies No Known Allergies  IV Location/Drains/Wounds Patient Lines/Drains/Airways Status   Active Line/Drains/Airways    Name:   Placement date:   Placement time:   Site:   Days:   Peripheral IV 07/10/17 Right Antecubital   07/10/17    -    Antecubital   1          Labs/Imaging Results for orders placed or performed during the hospital encounter of 07/10/17 (from the past 48 hour(s))  Comprehensive metabolic panel     Status: Abnormal   Collection Time: 07/10/17 11:57 PM  Result Value Ref Range   Sodium 141 135 - 145 mmol/L   Potassium 4.4 3.5 - 5.1 mmol/L   Chloride 108 98 - 111 mmol/L   CO2 21 (L) 22 - 32 mmol/L   Glucose, Bld 236 (H) 70 - 99 mg/dL   BUN 18 8 - 23 mg/dL   Creatinine, Ser 1.23 0.61 - 1.24 mg/dL   Calcium 9.1 8.9 - 10.3 mg/dL   Total Protein 7.1 6.5 - 8.1 g/dL   Albumin 4.1 3.5 - 5.0 g/dL   AST 30 15 - 41 U/L    ALT 36 0 - 44 U/L   Alkaline Phosphatase 101 38 - 126 U/L   Total Bilirubin 0.4 0.3 - 1.2 mg/dL   GFR calc non Af Amer 59 (L) >60 mL/min   GFR calc Af Amer >60 >60 mL/min    Comment: (NOTE) The eGFR has been calculated using the CKD EPI equation. This calculation has not been validated in all clinical situations. eGFR's persistently <60 mL/min signify possible Chronic Kidney Disease.    Anion gap 12 5 - 15    Comment: Performed at [facility], [place] [address], [place], [place] [address]  CBC with Differential/Platelet     Status: Abnormal   Collection Time: 07/10/17 11:57 PM  Result Value Ref Range   WBC 22.8 (H) 4.0 - 10.5 K/uL   RBC 5.34 4.22 - 5.81 MIL/uL   Hemoglobin 16.7 13.0 - 17.0 g/dL   HCT 47.9 39.0 - 52.0 %   MCV 89.7 78.0 - 100.0 fL   MCH 31.3 26.0 - 34.0 pg   MCHC 34.9 30.0 - 36.0 g/dL   RDW 13.0 11.5 - 15.5 %   Platelets 238 150 - 400 K/uL  Neutrophils Relative % 88 %   Neutro Abs 20.2 (H) 1.7 - 7.7 K/uL   Lymphocytes Relative 5 %   Lymphs Abs 1.0 0.7 - 4.0 K/uL   Monocytes Relative 7 %   Monocytes Absolute 1.6 (H) 0.1 - 1.0 K/uL   Eosinophils Relative 0 %   Eosinophils Absolute 0.0 0.0 - 0.7 K/uL   Basophils Relative 0 %   Basophils Absolute 0.0 0.0 - 0.1 K/uL    Comment: Performed at [facility], [place] [address], [place], [place] [address]  Ethanol     Status: None   Collection Time: 07/10/17 11:57 PM  Result Value Ref Range   Alcohol, Ethyl (B) <10 <10 mg/dL    Comment: (NOTE) Lowest detectable limit for serum alcohol is 10 mg/dL. For medical purposes only. Performed at [facility], [place] [address], [place], [place] [address]   Salicylate level     Status: None   Collection Time: 07/10/17 11:57 PM  Result Value Ref Range   Salicylate Lvl <7.3 2.8 - 30.0 mg/dL    Comment: Performed at [facility], [place] [address] [place], [place] [address]  Acetaminophen level     Status:  Abnormal   Collection Time: 07/10/17 11:57 PM  Result Value Ref Range   Acetaminophen (Tylenol), Serum <10 (L) 10 - 30 ug/mL    Comment: (NOTE) Therapeutic concentrations vary significantly. A range of 10-30 ug/mL  may be an effective concentration for many patients. However, some  are best treated at concentrations outside of this range. Acetaminophen concentrations >150 ug/mL at 4 hours after ingestion  and >50 ug/mL at 12 hours after ingestion are often associated with  toxic reactions. Performed at [facility], [place] [address] [place], [place] [address]    No results found.  Pending Labs Unresulted Labs (From admission, onward)   Start     Ordered   07/11/17 3419  Basic metabolic panel  Tomorrow morning,   R     07/11/17 0406   07/11/17 0404  HIV antibody (Routine Testing)  Once,   R     07/11/17 0406   07/10/17 2341  Rapid urine drug screen (hospital performed)  STAT,   R     07/10/17 2341      Vitals/Pain Today's Vitals   07/11/17 0330 07/11/17 0330 07/11/17 0345 07/11/17 0420  BP: 134/85     Pulse: 69  75   Resp: 11  10   Temp:      TempSrc:      SpO2: 96%  98%   PainSc:  0-No pain  0-No pain    Isolation Precautions No active isolations  Medications Medications  naloxone (NARCAN) injection 0.4 mg (has no administration in time range)  naloxone HCl (NARCAN) 4 mg in dextrose 5 % 250 mL infusion (0.25 mg/hr Intravenous New Bag/Given 07/11/17 0140)  acetaminophen (TYLENOL) tablet 650 mg (has no administration in time range)    Or  acetaminophen (TYLENOL) suppository 650 mg (has no administration in time range)  ondansetron (ZOFRAN) tablet 4 mg (has no administration in time range)    Or  ondansetron (ZOFRAN) injection 4 mg (has no administration in time range)  enoxaparin (LOVENOX) injection 40 mg (has no administration in time range)    Mobility walks

## 2017-07-11 NOTE — Progress Notes (Signed)
Patient ID: COBIN CADAVID, male   DOB: 03-07-1950, 67 y.o.   MRN: [id number] Patient was admitted early this morning for intentional opiate overdose and has been started on Narcan drip.  Patient seen and examined at bedside and plan of care discussed with him.  Prior medical records and this morning's H&P was reviewed by myself.  I have ordered stat labs for this morning which showed hyperkalemia for which I ordered repeat labs which showed normal potassium.  Patient had leukocytosis on admission which is improving.  Will monitor off antibiotics.  Monitor mental status.  Currently blood pressure is on the higher side for which I have started amlodipine and have ordered IV as needed antihypertensives.  Discussed with pharmacy, will continue Narcan drip for 24 hours and subsequently titrate it off.  Repeat a.m. labs.  I have placed psychiatry consultation.

## 2017-07-12 DIAGNOSIS — T402X2A Poisoning by other opioids, intentional self-harm, initial encounter: Secondary | ICD-10-CM | POA: Diagnosis present

## 2017-07-12 DIAGNOSIS — N400 Enlarged prostate without lower urinary tract symptoms: Secondary | ICD-10-CM

## 2017-07-12 DIAGNOSIS — Z87891 Personal history of nicotine dependence: Secondary | ICD-10-CM | POA: Diagnosis not present

## 2017-07-12 DIAGNOSIS — I10 Essential (primary) hypertension: Secondary | ICD-10-CM | POA: Diagnosis not present

## 2017-07-12 DIAGNOSIS — Z7982 Long term (current) use of aspirin: Secondary | ICD-10-CM | POA: Diagnosis not present

## 2017-07-12 DIAGNOSIS — F330 Major depressive disorder, recurrent, mild: Secondary | ICD-10-CM | POA: Diagnosis not present

## 2017-07-12 DIAGNOSIS — T1491XA Suicide attempt, initial encounter: Secondary | ICD-10-CM | POA: Diagnosis not present

## 2017-07-12 DIAGNOSIS — R40241 Glasgow coma scale score 13-15, unspecified time: Secondary | ICD-10-CM | POA: Diagnosis present

## 2017-07-12 DIAGNOSIS — D72829 Elevated white blood cell count, unspecified: Secondary | ICD-10-CM | POA: Diagnosis present

## 2017-07-12 DIAGNOSIS — G4700 Insomnia, unspecified: Secondary | ICD-10-CM | POA: Diagnosis not present

## 2017-07-12 DIAGNOSIS — F332 Major depressive disorder, recurrent severe without psychotic features: Secondary | ICD-10-CM

## 2017-07-12 DIAGNOSIS — T50902A Poisoning by unspecified drugs, medicaments and biological substances, intentional self-harm, initial encounter: Secondary | ICD-10-CM | POA: Diagnosis present

## 2017-07-12 DIAGNOSIS — Z95820 Peripheral vascular angioplasty status with implants and grafts: Secondary | ICD-10-CM | POA: Diagnosis not present

## 2017-07-12 DIAGNOSIS — Z79899 Other long term (current) drug therapy: Secondary | ICD-10-CM | POA: Diagnosis not present

## 2017-07-12 DIAGNOSIS — I739 Peripheral vascular disease, unspecified: Secondary | ICD-10-CM | POA: Diagnosis present

## 2017-07-12 DIAGNOSIS — T40602A Poisoning by unspecified narcotics, intentional self-harm, initial encounter: Secondary | ICD-10-CM | POA: Diagnosis not present

## 2017-07-12 DIAGNOSIS — Z8659 Personal history of other mental and behavioral disorders: Secondary | ICD-10-CM | POA: Diagnosis not present

## 2017-07-12 DIAGNOSIS — E875 Hyperkalemia: Secondary | ICD-10-CM | POA: Diagnosis present

## 2017-07-12 LAB — CBC WITH DIFFERENTIAL/PLATELET
Basophils Absolute: 0 10*3/uL (ref 0.0–0.1)
Basophils Relative: 0 %
EOS ABS: 0.1 10*3/uL (ref 0.0–0.7)
EOS PCT: 0 %
HCT: 41.9 % (ref 39.0–52.0)
Hemoglobin: 13.8 g/dL (ref 13.0–17.0)
LYMPHS ABS: 2.4 10*3/uL (ref 0.7–4.0)
LYMPHS PCT: 14 %
MCH: 30.1 pg (ref 26.0–34.0)
MCHC: 32.9 g/dL (ref 30.0–36.0)
MCV: 91.5 fL (ref 78.0–100.0)
MONOS PCT: 10 %
Monocytes Absolute: 1.6 10*3/uL — ABNORMAL HIGH (ref 0.1–1.0)
Neutro Abs: 13.1 10*3/uL — ABNORMAL HIGH (ref 1.7–7.7)
Neutrophils Relative %: 76 %
PLATELETS: 225 10*3/uL (ref 150–400)
RBC: 4.58 MIL/uL (ref 4.22–5.81)
RDW: 13.4 % (ref 11.5–15.5)
WBC: 17.2 10*3/uL — AB (ref 4.0–10.5)

## 2017-07-12 LAB — COMPREHENSIVE METABOLIC PANEL
ALT: 28 U/L (ref 0–44)
ANION GAP: 9 (ref 5–15)
AST: 22 U/L (ref 15–41)
Albumin: 3.8 g/dL (ref 3.5–5.0)
Alkaline Phosphatase: 90 U/L (ref 38–126)
BUN: 21 mg/dL (ref 8–23)
CHLORIDE: 102 mmol/L (ref 98–111)
CO2: 29 mmol/L (ref 22–32)
Calcium: 8.9 mg/dL (ref 8.9–10.3)
Creatinine, Ser: 1.09 mg/dL (ref 0.61–1.24)
GFR calc non Af Amer: >60 mL/min (ref >60)
Glucose, Bld: 114 mg/dL — ABNORMAL HIGH (ref 70–99)
Potassium: 4.3 mmol/L (ref 3.5–5.1)
SODIUM: 140 mmol/L (ref 135–145)
Total Bilirubin: 0.6 mg/dL (ref 0.3–1.2)
Total Protein: 6.4 g/dL — ABNORMAL LOW (ref 6.5–8.1)

## 2017-07-12 LAB — MAGNESIUM: MAGNESIUM: 2.2 mg/dL (ref 1.7–2.4)

## 2017-07-12 LAB — HIV ANTIBODY (ROUTINE TESTING W REFLEX): HIV SCREEN 4TH GENERATION: NONREACTIVE

## 2017-07-12 NOTE — Progress Notes (Signed)
LCSW following for inpatient psych placement.   Patient not medically stable for transport.   LCSW will continue to follow for dc needs.   Carolin Coy [facility] [place]

## 2017-07-12 NOTE — Progress Notes (Signed)
Narcan gtt cut off completely. Will continue to monitor closely.

## 2017-07-12 NOTE — Progress Notes (Signed)
PROGRESS NOTE    Dennis Patel  CZY:[id number] DOB: [birth year] DOA: 07/10/2017 PCP: [name], [name], MD   Brief Narrative:  HPI on 07/11/2017 by Dr. Jennette Kettle Dennis Patel is a 67 y.o. male with medical history significant of PAD.  Patient apparently tried to commit suicide yesterday evening around 6:15pm by taking 50 tablets of 15 mg of immediate release morphine, and 25 tablets of 15 mg extended release morphine.  Meds belong to his wife who has dementia.  He is her caregiver.  He feels there is no way out other than to kill himself by overdosing.  Wife found him lethargic and called EMS.  Interim history Found to have overdosed on opiates.  Was placed on Narcan drip.  Psychiatry consulted. Assessment & Plan   Opiate overdose/suicide attempt -Patient admitted to taking his wife's morphine -Was placed on Narcan gtt, and has now been weaned off -Mental status improved -Psychiatry consulted and appreciated, recommended inpatient psych admission -Social work consulted  BPH -Will restart patient's Proscar -Patient states he follows with Dr. Junious Silk, urology and was supposed to have an MRI of the prostate  Hypertension  -Stable, was started on amiodarone  DVT Prophylaxis  lovenox  Code Status: Full  Family Communication: Friend at bedside  Disposition Plan: Admitted, pending inpatient psych  Consultants Psychiatry   Procedures  None  Antibiotics   Anti-infectives (From admission, onward)   None      Subjective:   Dennis Patel seen and examined today.  Patient states he is feeling better.  Denies current chest pain, shortness breath, abdominal pain, nausea vomiting, diarrhea or constipation.  Objective:   Vitals:   07/12/17 1400 07/12/17 1500 07/12/17 1550 07/12/17 1600  BP:    (!) 170/76  Pulse: 62 67  70  Resp: 15 15  14   Temp:   98.6 F (37 C)   TempSrc:   Oral   SpO2: 93% 93%  96%  Weight:      Height:        Intake/Output Summary  (Last 24 hours) at 07/12/2017 1606 Last data filed at 07/12/2017 1550 Gross per 24 hour  Intake 953.98 ml  Output 3325 ml  Net -2371.02 ml   Filed Weights   07/11/17 0522  Weight: 97.5 kg (214 lb 15.2 oz)    Exam  General: Well developed, well nourished, NAD, appears stated age  HEENT: NCAT, PERRLA, EOMI, Anicteic Sclera, mucous membranes moist.   Neck: Supple  Cardiovascular: S1 S2 auscultated, no rubs, murmurs or gallops. Regular rate and rhythm.  Respiratory: Clear to auscultation bilaterally with equal chest rise  Abdomen: Soft, nontender, nondistended, + bowel sounds  Extremities: warm dry without cyanosis clubbing or edema  Neuro: AAOx3, cranial nerves grossly intact. Strength 5/5 in patient's upper and lower extremities bilaterally  Skin: Without rashes exudates or nodules  Psych: Normal affect and demeanor with intact judgement and insight   Data Reviewed: I have personally reviewed following labs and imaging studies  CBC: Recent Labs  Lab 07/10/17 2357 07/11/17 0644 07/12/17 0324  WBC 22.8* 18.2* 17.2*  NEUTROABS 20.2* 16.9* 13.1*  HGB 16.7 16.0 13.8  HCT 47.9 47.1 41.9  MCV 89.7 90.2 91.5  PLT 238 262 093   Basic Metabolic Panel: Recent Labs  Lab 07/10/17 2357 07/11/17 0644 07/11/17 1043 07/12/17 0324  NA 141 139 139 140  K 4.4 5.6* 5.0 4.3  CL 108 105 104 102  CO2 21* 24 25 29   GLUCOSE 236* 224* 192* 114*  BUN 18 24* 24* 21  CREATININE 1.23 1.30* 1.26* 1.09  CALCIUM 9.1 9.1 9.1 8.9  MG  --   --   --  2.2   GFR: Estimated Creatinine Clearance: 72.2 mL/min (by C-G formula based on SCr of 1.09 mg/dL). Liver Function Tests: Recent Labs  Lab 07/10/17 2357 07/11/17 0644 07/11/17 1043 07/12/17 0324  AST 30 29 25 22   ALT 36 37 34 28  ALKPHOS 101 100 103 90  BILITOT 0.4 0.9 0.8 0.6  PROT 7.1 7.1 7.1 6.4*  ALBUMIN 4.1 4.0 4.0 3.8   No results for input(s): LIPASE, AMYLASE in the last 168 hours. No results for input(s): AMMONIA in the  last 168 hours. Coagulation Profile: No results for input(s): INR, PROTIME in the last 168 hours. Cardiac Enzymes: No results for input(s): CKTOTAL, CKMB, CKMBINDEX, TROPONINI in the last 168 hours. BNP (last 3 results) No results for input(s): PROBNP in the last 8760 hours. HbA1C: No results for input(s): HGBA1C in the last 72 hours. CBG: No results for input(s): GLUCAP in the last 168 hours. Lipid Profile: No results for input(s): CHOL, HDL, LDLCALC, TRIG, CHOLHDL, LDLDIRECT in the last 72 hours. Thyroid Function Tests: No results for input(s): TSH, T4TOTAL, FREET4, T3FREE, THYROIDAB in the last 72 hours. Anemia Panel: No results for input(s): VITAMINB12, FOLATE, FERRITIN, TIBC, IRON, RETICCTPCT in the last 72 hours. Urine analysis: No results found for: COLORURINE, APPEARANCEUR, LABSPEC, PHURINE, GLUCOSEU, HGBUR, BILIRUBINUR, KETONESUR, PROTEINUR, UROBILINOGEN, NITRITE, LEUKOCYTESUR Sepsis Labs: @LABRCNTIP (procalcitonin:4,lacticidven:4)  ) Recent Results (from the past 240 hour(s))  MRSA PCR Screening     Status: None   Collection Time: 07/11/17  5:28 AM  Result Value Ref Range Status   MRSA by PCR NEGATIVE NEGATIVE Final    Comment:        The GeneXpert MRSA Assay (FDA approved for NASAL specimens only), is one component of a comprehensive MRSA colonization surveillance program. It is not intended to diagnose MRSA infection nor to guide or monitor treatment for MRSA infections. Performed at [facility], [place] [address] [place], [place] [address]       Radiology Studies: No results found.   Scheduled Meds: . amLODipine  10 mg Oral Daily  . enoxaparin (LOVENOX) injection  40 mg Subcutaneous Q24H  . famotidine  20 mg Oral BID   Continuous Infusions: . naLOXone (NARCAN) adult infusion for OVERDOSE Stopped (07/12/17 1120)     LOS: 0 days   Time Spent in minutes   30 minutes  Arthea Nobel D.O. on 07/12/2017 at 4:06 PM  Between 7am to  7pm - Pager - [phone]  After 7pm go to [email or web address] - password [email or web address]  And look for the night coverage person covering for me after hours  [facility] Office  [phone]

## 2017-07-12 NOTE — Progress Notes (Signed)
Verbal orders from MD Mikhail to wean Narcan gtt. RN working with pharmacy to wean drip by 0.1mg  q1-2 until cut off. Will monitor pt. Closely.

## 2017-07-13 LAB — CBC
HCT: 44.9 % (ref 39.0–52.0)
Hemoglobin: 15 g/dL (ref 13.0–17.0)
MCH: 30.4 pg (ref 26.0–34.0)
MCHC: 33.4 g/dL (ref 30.0–36.0)
MCV: 90.9 fL (ref 78.0–100.0)
Platelets: 206 10*3/uL (ref 150–400)
RBC: 4.94 MIL/uL (ref 4.22–5.81)
RDW: 13.2 % (ref 11.5–15.5)
WBC: 11.1 10*3/uL — ABNORMAL HIGH (ref 4.0–10.5)

## 2017-07-13 MED ORDER — FINASTERIDE 5 MG PO TABS
5.0000 mg | ORAL_TABLET | Freq: Every day | ORAL | Status: DC
  Administered 2017-07-13 – 2017-07-17 (×5): 5 mg via ORAL
  Filled 2017-07-13 (×5): qty 1

## 2017-07-13 NOTE — Progress Notes (Signed)
PROGRESS NOTE    Dennis Patel  GEX:[id number] DOB: December 06, 1950 DOA: 07/10/2017 PCP: [name], [name], MD   Brief Narrative:  HPI on 07/11/2017 by Dr. Jennette Kettle Dennis Patel is a 67 y.o. male with medical history significant of PAD.  Patient apparently tried to commit suicide yesterday evening around 6:15pm by taking 50 tablets of 15 mg of immediate release morphine, and 25 tablets of 15 mg extended release morphine.  Meds belong to his wife who has dementia.  He is her caregiver.  He feels there is no way out other than to kill himself by overdosing.  Wife found him lethargic and called EMS.  Interim history Found to have overdosed on opiates.  Was placed on Narcan drip.  Psychiatry consulted. Assessment & Plan   Opiate overdose/suicide attempt -Patient admitted to taking his wife's morphine -Was placed on Narcan gtt, and weaned off -Mental status improved -Psychiatry consulted and appreciated, recommended inpatient psych admission- pending  -Social work consulted  BPH -Continue Proscar -Patient states he follows with Dr. Junious Silk, urology and was supposed to have an MRI of the prostate -discussed with radiology and recommended outpatient MRI due to less invasive rest  Hypertension  -Stable, was started on amiodarone  DVT Prophylaxis  lovenox  Code Status: Full  Family Communication: Friend at bedside  Disposition Plan: Admitted, pending inpatient psych  Consultants Psychiatry   Procedures  None  Antibiotics   Anti-infectives (From admission, onward)   None      Subjective:   [name] seen and examined today.  States he is feeling better today.  Denies any current chest pain, shortness breath, abdominal pain, nausea or vomiting, diarrhea or constipation, dizziness or headache.  Objective:   Vitals:   07/12/17 1700 07/12/17 1800 07/12/17 2047 07/13/17 0516  BP:   (!) 169/72 (!) 141/76  Pulse: 66 95 67 (!) 57  Resp: 18 (!) 21 18 18   Temp:    98.4 F (36.9 C) 97.9 F (36.6 C)  TempSrc:   Oral Oral  SpO2: 95% 93% 100% 100%  Weight:      Height:        Intake/Output Summary (Last 24 hours) at 07/13/2017 1144 Last data filed at 07/13/2017 0848 Gross per 24 hour  Intake 480 ml  Output 1400 ml  Net -920 ml   Filed Weights   07/11/17 0522  Weight: 97.5 kg (214 lb 15.2 oz)   Exam  General: Well developed, well nourished, NAD, appears stated age  34: NCAT, mucous membranes moist.   Neck: Supple  Cardiovascular: S1 S2 auscultated, RRR, no murmur  Respiratory: Clear to auscultation bilaterally with equal chest rise  Abdomen: Soft, nontender, nondistended, + bowel sounds  Extremities: warm dry without cyanosis clubbing or edema  Neuro: AAOx3, nonfocal  Psych: appropriate mood and affect   Data Reviewed: I have personally reviewed following labs and imaging studies  CBC: Recent Labs  Lab 07/10/17 2357 07/11/17 0644 07/12/17 0324 07/13/17 0543  WBC 22.8* 18.2* 17.2* 11.1*  NEUTROABS 20.2* 16.9* 13.1*  --   HGB 16.7 16.0 13.8 15.0  HCT 47.9 47.1 41.9 44.9  MCV 89.7 90.2 91.5 90.9  PLT 238 262 225 010   Basic Metabolic Panel: Recent Labs  Lab 07/10/17 2357 07/11/17 0644 07/11/17 1043 07/12/17 0324  NA 141 139 139 140  K 4.4 5.6* 5.0 4.3  CL 108 105 104 102  CO2 21* 24 25 29   GLUCOSE 236* 224* 192* 114*  BUN 18 24* 24*  21  CREATININE 1.23 1.30* 1.26* 1.09  CALCIUM 9.1 9.1 9.1 8.9  MG  --   --   --  2.2   GFR: Estimated Creatinine Clearance: 72.2 mL/min (by C-G formula based on SCr of 1.09 mg/dL). Liver Function Tests: Recent Labs  Lab 07/10/17 2357 07/11/17 0644 07/11/17 1043 07/12/17 0324  AST 30 29 25 22   ALT 36 37 34 28  ALKPHOS 101 100 103 90  BILITOT 0.4 0.9 0.8 0.6  PROT 7.1 7.1 7.1 6.4*  ALBUMIN 4.1 4.0 4.0 3.8   No results for input(s): LIPASE, AMYLASE in the last 168 hours. No results for input(s): AMMONIA in the last 168 hours. Coagulation Profile: No results for  input(s): INR, PROTIME in the last 168 hours. Cardiac Enzymes: No results for input(s): CKTOTAL, CKMB, CKMBINDEX, TROPONINI in the last 168 hours. BNP (last 3 results) No results for input(s): PROBNP in the last 8760 hours. HbA1C: No results for input(s): HGBA1C in the last 72 hours. CBG: No results for input(s): GLUCAP in the last 168 hours. Lipid Profile: No results for input(s): CHOL, HDL, LDLCALC, TRIG, CHOLHDL, LDLDIRECT in the last 72 hours. Thyroid Function Tests: No results for input(s): TSH, T4TOTAL, FREET4, T3FREE, THYROIDAB in the last 72 hours. Anemia Panel: No results for input(s): VITAMINB12, FOLATE, FERRITIN, TIBC, IRON, RETICCTPCT in the last 72 hours. Urine analysis: No results found for: COLORURINE, APPEARANCEUR, LABSPEC, PHURINE, GLUCOSEU, HGBUR, BILIRUBINUR, KETONESUR, PROTEINUR, UROBILINOGEN, NITRITE, LEUKOCYTESUR Sepsis Labs: @LABRCNTIP (procalcitonin:4,lacticidven:4)  ) Recent Results (from the past 240 hour(s))  MRSA PCR Screening     Status: None   Collection Time: 07/11/17  5:28 AM  Result Value Ref Range Status   MRSA by PCR NEGATIVE NEGATIVE Final    Comment:        The GeneXpert MRSA Assay (FDA approved for NASAL specimens only), is one component of a comprehensive MRSA colonization surveillance program. It is not intended to diagnose MRSA infection nor to guide or monitor treatment for MRSA infections. Performed at [facility], [place] [address], [place], [place] [address]       Radiology Studies: No results found.   Scheduled Meds: . amLODipine  10 mg Oral Daily  . enoxaparin (LOVENOX) injection  40 mg Subcutaneous Q24H  . famotidine  20 mg Oral BID  . finasteride  5 mg Oral Daily   Continuous Infusions: . naLOXone (NARCAN) adult infusion for OVERDOSE Stopped (07/12/17 1120)     LOS: 1 day   Time Spent in minutes   30 minutes  Waris Rodger D.O. on 07/13/2017 at 11:44 AM  Between 7am to 7pm - Pager -  [phone]  After 7pm go to [email or web address] - password [email or web address]  And look for the night coverage person covering for me after hours  [facility] Office  [phone]

## 2017-07-13 NOTE — Clinical Social Work Note (Signed)
Clinical Social Work Assessment  Patient Details  Name: Dennis Patel MRN: [id number] Date of Birth: 1950-07-20  Date of referral:  07/13/17               Reason for consult:  Facility Placement                Permission sought to share information with:    Permission granted to share information::  No  Name::        Agency::     Relationship::     Contact Information:     Housing/Transportation Living arrangements for the past 2 months:  Single Family Home Source of Information:  Patient Patient Interpreter Needed:  None Criminal Activity/Legal Involvement Pertinent to Current Situation/Hospitalization:  No - Comment as needed Significant Relationships:  Siblings Lives with:  Spouse Do you feel safe going back to the place where you live?  Yes Need for family participation in patient care:  Yes (Comment)  Care giving concerns:  No care giving concerns at the time of assessment.    Social Worker assessment / plan:  LCSW following for inpatient psych placement.   Patient admitted for intentional OD.  LCSW met with patient at bedside. Patient has a support present. Patient has sitter.   LCSW explained role and reason for visit.   Patient states that he has never been inpatient and he is not seen in the community by psych or a therapist. Patient reports that this is his first attempt at suicide.   Patient reports that he has unresolved health issues and that his wife has dementia and he has been her caregiver for the past 3 years. Patient states that he has a lot of stressors and everything came to a head over the weekend. Patient believes his wife is beingbcared for by her family and that all communication with them have stopped. Patient reports that wife's family were concerned about life insurance policies and access to joint accounts.   Patient reports that his brother and sister are supports to him. Patient plans to stay with his sister for awhile once dc from inpatient  psych.    PLAN: Inpatient psych when medically stable. Patient is voluntary at the time of assessment.   Employment status:  Retired Nurse, adult PT Recommendations:    Information / Referral to community resources:     Patient/Family's Response to care:  Patient is pleasant and thankful for LCSW visit and coordination with dc planning.   Patient/Family's Understanding of and Emotional Response to Diagnosis, Current Treatment, and Prognosis:  Patient states that though he is willing to go inpatient he would prefer to stay close as possible. Patient states he has a support system around him that he feels needs to be apart of his healing.   Emotional Assessment Appearance:  Appears stated age Attitude/Demeanor/Rapport:    Affect (typically observed):  Accepting, Calm, Overwhelmed Orientation:  Oriented to Self, Oriented to Place, Oriented to  Time, Oriented to Situation Alcohol / Substance use:  Not Applicable Psych involvement (Current and /or in the community):  No (Comment)  Discharge Needs  Concerns to be addressed:  No discharge needs identified Readmission within the last 30 days:  No Current discharge risk:  None Barriers to Discharge:  Continued Medical Work up   [employer], LCSW 07/13/2017, 3:07 PM

## 2017-07-14 DIAGNOSIS — G4700 Insomnia, unspecified: Secondary | ICD-10-CM

## 2017-07-14 DIAGNOSIS — Z87891 Personal history of nicotine dependence: Secondary | ICD-10-CM

## 2017-07-14 NOTE — Consult Note (Signed)
[facility] Psych Consult Progress Note  07/14/2017 11:30 AM Dennis Patel  MRN:  [id number] Subjective:   Patient was last seen on 6/25 for suicide attempt by drug overdose with Morphine in the setting of stressors related to being the caregiver for his wife who has dementia and managing his own medical problems.   On interview, Dennis Patel reports reports that he is feeling better today.  He reports, "I know I need help but I would like to stay in [place]" when inquiring about the inpatient psychiatric facility where he may go.  He denies SI, HI or AVH.  He reports multiple nighttime awakenings due to waking to use the bathroom and he is also used to waking up numerous times during the night since his wife is restless.  He continues to decline medication management for depression at this time.  He was informed about the structure of an inpatient psychiatric unit.  He is aware that he will be involved in groups and other activities unlike his current hospitalization.  Principal Problem: MDD (major depressive disorder), recurrent severe, without psychosis ([place]) Diagnosis:   Patient Active Problem List   Diagnosis Date Noted  . Opiate overdose, intentional self-harm, initial encounter ([place]) [T40.602A] 07/11/2017  . MDD (major depressive disorder), recurrent severe, without psychosis ([place]) [F33.2]    Total Time spent with patient: 15 minutes  Past Psychiatric History: Depression and alcohol abuse.  Past Medical History:  Past Medical History:  Diagnosis Date  . Claudication ([place])   . Peripheral artery disease [facility])     Past Surgical History:  Procedure Laterality Date  . ILIAC ARTERY STENT     Family History: History reviewed. No pertinent family history. Family Psychiatric  History: Denies  Social History:  Social History   Substance and Sexual Activity  Alcohol Use Never  . Frequency: Never     Social History   Substance and Sexual Activity  Drug Use Never    Social History    Socioeconomic History  . Marital status: Married    Spouse name: Not on file  . Number of children: Not on file  . Years of education: Not on file  . Highest education level: Not on file  Occupational History  . Not on file  Social Needs  . Financial resource strain: Not on file  . Food insecurity:    Worry: Not on file    Inability: Not on file  . Transportation needs:    Medical: Not on file    Non-medical: Not on file  Tobacco Use  . Smoking status: Former Smoker    Types: Cigarettes  . Smokeless tobacco: Never Used  Substance and Sexual Activity  . Alcohol use: Never    Frequency: Never  . Drug use: Never  . Sexual activity: Not on file  Lifestyle  . Physical activity:    Days per week: Not on file    Minutes per session: Not on file  . Stress: Not on file  Relationships  . Social connections:    Talks on phone: Not on file    Gets together: Not on file    Attends religious service: Not on file    Active member of club or organization: Not on file    Attends meetings of clubs or organizations: Not on file    Relationship status: Not on file  Other Topics Concern  . Not on file  Social History Narrative  . Not on file    Sleep: Good  Appetite:  Fair  Current Medications: Current Facility-Administered Medications  Medication Dose Route Frequency Provider Last Rate Last Dose  . acetaminophen (TYLENOL) tablet 650 mg  650 mg Oral Q6H PRN Cristal Ford, DO       Or  . acetaminophen (TYLENOL) suppository 650 mg  650 mg Rectal Q6H PRN Cristal Ford, DO      . alum & mag hydroxide-simeth (MAALOX/MYLANTA) 200-200-20 MG/5ML suspension 30 mL  30 mL Oral Q4H PRN Cristal Ford, DO   30 mL at 07/11/17 1435  . amLODipine (NORVASC) tablet 10 mg  10 mg Oral Daily Cristal Ford, DO   10 mg at 07/14/17 1018  . enoxaparin (LOVENOX) injection 40 mg  40 mg Subcutaneous Q24H Mikhail, [place], DO   40 mg at 07/14/17 1018  . famotidine (PEPCID) tablet 20 mg  20 mg  Oral BID Cristal Ford, DO   20 mg at 07/14/17 1018  . finasteride (PROSCAR) tablet 5 mg  5 mg Oral Daily Cristal Ford, DO   5 mg at 07/14/17 1018  . hydrALAZINE (APRESOLINE) injection 5 mg  5 mg Intravenous Q4H PRN Cristal Ford, DO   5 mg at 07/11/17 1342  . hydrocortisone cream 1 % 1 application  1 application Topical TID PRN Cristal Ford, DO      . metoprolol tartrate (LOPRESSOR) injection 2.5 mg  2.5 mg Intravenous Q4H PRN Cristal Ford, DO   2.5 mg at 07/11/17 1144  . naloxone (NARCAN) injection 0.4 mg  0.4 mg Intravenous PRN Cristal Ford, DO      . naloxone HCl (NARCAN) 4 mg in dextrose 5 % 250 mL infusion  0.25 mg/hr Intravenous Continuous Cristal Ford, DO   Stopped at 07/12/17 1120  . ondansetron (ZOFRAN) tablet 4 mg  4 mg Oral Q6H PRN Cristal Ford, DO       Or  . ondansetron [facility]) injection 4 mg  4 mg Intravenous Q6H PRN Cristal Ford, DO        Lab Results:  Results for orders placed or performed during the hospital encounter of 07/10/17 (from the past 48 hour(s))  CBC     Status: Abnormal   Collection Time: 07/13/17  5:43 AM  Result Value Ref Range   WBC 11.1 (H) 4.0 - 10.5 K/uL   RBC 4.94 4.22 - 5.81 MIL/uL   Hemoglobin 15.0 13.0 - 17.0 g/dL   HCT 44.9 39.0 - 52.0 %   MCV 90.9 78.0 - 100.0 fL   MCH 30.4 26.0 - 34.0 pg   MCHC 33.4 30.0 - 36.0 g/dL   RDW 13.2 11.5 - 15.5 %   Platelets 206 150 - 400 K/uL    Comment: Performed at [facility], [place] [address] [place], [place] [address]    Blood Alcohol level:  Lab Results  Component Value Date   [facility] <10 07/10/2017    Musculoskeletal: Strength & Muscle Tone: within normal limits Gait & Station: normal Patient leans: N/A  Psychiatric Specialty Exam: Physical Exam  Nursing note and vitals reviewed. Constitutional: He is oriented to person, place, and time. He appears well-developed and well-nourished.  HENT:  Head: Normocephalic and atraumatic.  Neck: Normal  range of motion.  Respiratory: Effort normal.  Musculoskeletal: Normal range of motion.  Neurological: He is alert and oriented to person, place, and time.  Skin: No rash noted.  Psychiatric: His behavior is normal. Judgment and thought content normal. Cognition and memory are normal. He exhibits a depressed mood.    Review of Systems  Constitutional: Negative for chills and fever.  Cardiovascular: Negative for chest pain.  Gastrointestinal: Positive for constipation. Negative for abdominal pain, diarrhea, nausea and vomiting.  Psychiatric/Behavioral: Positive for depression. Negative for hallucinations, substance abuse and suicidal ideas. The patient has insomnia.   All other systems reviewed and are negative.   Blood pressure (!) 143/81, pulse 67, temperature 98.2 F (36.8 C), temperature source Oral, resp. rate 20, height 5\' 7"  (1.702 m), weight 97.5 kg (214 lb 15.2 oz), SpO2 98 %.Body mass index is 33.67 kg/m.  General Appearance: Fairly Groomed, obese, elderly, Caucasian male, wearing a hospital gown with gray, thinning hair who is lying in bed. NAD.   Eye Contact:  Good  Speech:  Clear and Coherent and Normal Rate  Volume:  Normal  Mood:  "I feel better today."  Affect:  Constricted  Thought Process:  Goal Directed, Linear and Descriptions of Associations: Intact  Orientation:  Full (Time, Place, and Person)  Thought Content:  Logical  Suicidal Thoughts:  No  Homicidal Thoughts:  No  Memory:  Immediate;   Good Recent;   Good Remote;   Good  Judgement:  Fair  Insight:  Fair  Psychomotor Activity:  Normal  Concentration:  Concentration: Good and Attention Span: Good  Recall:  Good  Fund of Knowledge:  Good  Language:  Good  Akathisia:  No  Handed:  Right  AIMS (if indicated):   N/A  Assets:  Communication Skills Desire for Improvement Housing Social Support  ADL's:  Intact  Cognition:  WNL  Sleep:   Fair   Assessment:  Dennis Patel is a 67 y.o. male who was  admitted with suicide attempt by Morphine overdose in the setting of multiple stressors. Patient remains a high risk of harm to self given almost lethal suicide attempt and ongoing stressors. He continues to warrant inpatient psychiatric hospitalization for stabilization and treatment.    Treatment Plan Summary: -Patient warrants inpatient psychiatric hospitalization given high risk of harm to self. -Continue bedside sitter.  -Patient declines starting medication at this time. Will continue to encourage medication management and/or therapy for depression.  -EKG reviewed and QTc 464 on 6/25. Please closely monitor when starting or increasing QTc prolonging agents.  -Please pursue involuntary commitment if patient refuses voluntary psychiatric hospitalization or attempts to leave the hospital.  -Will sign off on patient at this time. Please consult psychiatry again as needed.     Faythe Dingwall, DO 07/14/2017, 11:30 AM

## 2017-07-14 NOTE — Progress Notes (Signed)
PROGRESS NOTE    Dennis Patel  MEQ:[id number] DOB: 12-14-1950 DOA: 07/10/2017 PCP: [name], [name], MD   Brief Narrative:  HPI on 07/11/2017 by Dr. Jennette Kettle Dennis Patel is a 67 y.o. male with medical history significant of PAD.  Patient apparently tried to commit suicide yesterday evening around 6:15pm by taking 50 tablets of 15 mg of immediate release morphine, and 25 tablets of 15 mg extended release morphine.  Meds belong to his wife who has dementia.  He is her caregiver.  He feels there is no way out other than to kill himself by overdosing.  Wife found him lethargic and called EMS.  Interim history Found to have overdosed on opiates.  Was placed on Narcan drip.  Psychiatry consulted. Currently patient is medically stable for discharge to inpatient psychiatry.   Assessment & Plan   Opiate overdose/suicide attempt -Patient admitted to taking his wife's morphine -Was placed on Narcan gtt, and weaned off -Mental status improved -Psychiatry consulted and appreciated, recommended inpatient psych admission- pending  -Social work consulted  BPH -Continue Proscar -Patient states he follows with Dr. Junious Silk, urology and was supposed to have an MRI of the prostate -discussed with radiology and recommended outpatient MRI due to less invasive rest  Hypertension  -Stable, was started on amiodarone  DVT Prophylaxis  lovenox  Code Status: Full  Family Communication: Sister at bedside  Disposition Plan: Admitted.  Patient is medically stable for discharge to inpatient psychiatry, currently pending.  Consultants Psychiatry   Procedures  None  Antibiotics   Anti-infectives (From admission, onward)   None      Subjective:   [name] seen and examined today.  Patient feeling much better today.  Able to walk up and down the hallway with his sister as well as sitter.  Denies current chest pain, shortness breath, abdominal pain, nausea vomiting, diarrhea or  constipation, headache or dizziness.  Objective:   Vitals:   07/13/17 1420 07/13/17 2101 07/14/17 0042 07/14/17 0600  BP:  (!) 145/80 (!) 146/83 (!) 143/81  Pulse: 67 68 70 67  Resp: 18 18 18 20   Temp: 98.5 F (36.9 C) 98.1 F (36.7 C) 98.6 F (37 C) 98.2 F (36.8 C)  TempSrc: Oral Oral Oral Oral  SpO2: 98% 99% 97% 98%  Weight:      Height:        Intake/Output Summary (Last 24 hours) at 07/14/2017 1119 Last data filed at 07/13/2017 2105 Gross per 24 hour  Intake 480 ml  Output -  Net 480 ml   Filed Weights   07/11/17 0522  Weight: 97.5 kg (214 lb 15.2 oz)   Exam  General: Well developed, well nourished, NAD, appears stated age  83: NCAT, mucous membranes moist.   Neck: Supple  Extremities: warm dry without cyanosis clubbing or edema  Neuro: AAOx3, focal  Psych: Normal affect and demeanor with intact judgement and insight, pleasant  Data Reviewed: I have personally reviewed following labs and imaging studies  CBC: Recent Labs  Lab 07/10/17 2357 07/11/17 0644 07/12/17 0324 07/13/17 0543  WBC 22.8* 18.2* 17.2* 11.1*  NEUTROABS 20.2* 16.9* 13.1*  --   HGB 16.7 16.0 13.8 15.0  HCT 47.9 47.1 41.9 44.9  MCV 89.7 90.2 91.5 90.9  PLT 238 262 225 297   Basic Metabolic Panel: Recent Labs  Lab 07/10/17 2357 07/11/17 0644 07/11/17 1043 07/12/17 0324  NA 141 139 139 140  K 4.4 5.6* 5.0 4.3  CL 108 105 104 102  CO2 21* 24 25 29   GLUCOSE 236* 224* 192* 114*  BUN 18 24* 24* 21  CREATININE 1.23 1.30* 1.26* 1.09  CALCIUM 9.1 9.1 9.1 8.9  MG  --   --   --  2.2   GFR: Estimated Creatinine Clearance: 72.2 mL/min (by C-G formula based on SCr of 1.09 mg/dL). Liver Function Tests: Recent Labs  Lab 07/10/17 2357 07/11/17 0644 07/11/17 1043 07/12/17 0324  AST 30 29 25 22   ALT 36 37 34 28  ALKPHOS 101 100 103 90  BILITOT 0.4 0.9 0.8 0.6  PROT 7.1 7.1 7.1 6.4*  ALBUMIN 4.1 4.0 4.0 3.8   No results for input(s): LIPASE, AMYLASE in the last 168  hours. No results for input(s): AMMONIA in the last 168 hours. Coagulation Profile: No results for input(s): INR, PROTIME in the last 168 hours. Cardiac Enzymes: No results for input(s): CKTOTAL, CKMB, CKMBINDEX, TROPONINI in the last 168 hours. BNP (last 3 results) No results for input(s): PROBNP in the last 8760 hours. HbA1C: No results for input(s): HGBA1C in the last 72 hours. CBG: No results for input(s): GLUCAP in the last 168 hours. Lipid Profile: No results for input(s): CHOL, HDL, LDLCALC, TRIG, CHOLHDL, LDLDIRECT in the last 72 hours. Thyroid Function Tests: No results for input(s): TSH, T4TOTAL, FREET4, T3FREE, THYROIDAB in the last 72 hours. Anemia Panel: No results for input(s): VITAMINB12, FOLATE, FERRITIN, TIBC, IRON, RETICCTPCT in the last 72 hours. Urine analysis: No results found for: COLORURINE, APPEARANCEUR, LABSPEC, PHURINE, GLUCOSEU, HGBUR, BILIRUBINUR, KETONESUR, PROTEINUR, UROBILINOGEN, NITRITE, LEUKOCYTESUR Sepsis Labs: @LABRCNTIP (procalcitonin:4,lacticidven:4)  ) Recent Results (from the past 240 hour(s))  MRSA PCR Screening     Status: None   Collection Time: 07/11/17  5:28 AM  Result Value Ref Range Status   MRSA by PCR NEGATIVE NEGATIVE Final    Comment:        The GeneXpert MRSA Assay (FDA approved for NASAL specimens only), is one component of a comprehensive MRSA colonization surveillance program. It is not intended to diagnose MRSA infection nor to guide or monitor treatment for MRSA infections. Performed at [facility], [place] [address] [place], [place] [address]       Radiology Studies: No results found.   Scheduled Meds: . amLODipine  10 mg Oral Daily  . enoxaparin (LOVENOX) injection  40 mg Subcutaneous Q24H  . famotidine  20 mg Oral BID  . finasteride  5 mg Oral Daily   Continuous Infusions: . naLOXone (NARCAN) adult infusion for OVERDOSE Stopped (07/12/17 1120)     LOS: 2 days   Time Spent in minutes    20 minutes  Aris Even D.O. on 07/14/2017 at 11:19 AM  Between 7am to 7pm - Pager - [phone]  After 7pm go to [email or web address] - password [email or web address]  And look for the night coverage person covering for me after hours  [facility] Office  [phone]

## 2017-07-14 NOTE — Progress Notes (Signed)
LCSW following for inpatient psych placement.  Patient under review at [facility]  LCSW will continue to follow for placement.   Carolin Coy [facility] [place]

## 2017-07-14 NOTE — Care Management Important Message (Signed)
Important Message  Patient Details  Name: Dennis Patel MRN: [id number] Date of Birth: 1950/04/09   Medicare Important Message Given:  Yes    Kerin Salen 07/14/2017, 9:55 AMImportant Message  Patient Details  Name: Dennis Patel MRN: [id number] Date of Birth: December 23, 1950   Medicare Important Message Given:  Yes    Kerin Salen 07/14/2017, 9:55 AM

## 2017-07-15 NOTE — Progress Notes (Signed)
PROGRESS NOTE    Dennis Patel  PZW:[id number] DOB: 12/11/1950 DOA: 07/10/2017 PCP: [name], [name], MD   Brief Narrative:  HPI on 07/11/2017 by Dr. Jennette Kettle Dennis Patel is a 67 y.o. male with medical history significant of PAD.  Patient apparently tried to commit suicide yesterday evening around 6:15pm by taking 50 tablets of 15 mg of immediate release morphine, and 25 tablets of 15 mg extended release morphine.  Meds belong to his wife who has dementia.  He is her caregiver.  He feels there is no way out other than to kill himself by overdosing.  Wife found him lethargic and called EMS.  Interim history Found to have overdosed on opiates.  Was placed on Narcan drip.  Psychiatry consulted. Currently patient is medically stable for discharge to inpatient psychiatry.   Assessment & Plan   Opiate overdose/suicide attempt -Patient admitted to taking his wife's morphine -Was placed on Narcan gtt, and weaned off -Mental status improved -Psychiatry consulted and appreciated, recommended inpatient psych admission- pending  -Social work consulted  BPH -Continue Proscar -Patient states he follows with Dr. Junious Silk, urology and was supposed to have an MRI of the prostate -discussed with radiology and recommended outpatient MRI due to less invasive rest  Hypertension  -Stable, was started on amiodarone  DVT Prophylaxis  lovenox  Code Status: Full  Family Communication: None at bedside  Disposition Plan: Admitted.  Patient is medically stable for discharge to inpatient psychiatry, currently pending.  Consultants Psychiatry   Procedures  None  Antibiotics   Anti-infectives (From admission, onward)   None      Subjective:   Dennis Patel seen and examined today.  Has no complaints today. Feeling better. Denies chest pain, shortness of breath, abdominal pain, N/V/D/C.   Objective:   Vitals:   07/14/17 0600 07/14/17 1452 07/14/17 2015 07/15/17 0637  BP: (!)  143/81 (!) 146/83 (!) 141/82 135/76  Pulse: 67 72 72 66  Resp: 20 16 18 18   Temp: 98.2 F (36.8 C) 97.7 F (36.5 C) 98.2 F (36.8 C) 98.3 F (36.8 C)  TempSrc: Oral Oral Oral Oral  SpO2: 98% 98% 98% 93%  Weight:      Height:       No intake or output data in the 24 hours ending 07/15/17 0938 Filed Weights   07/11/17 0522  Weight: 97.5 kg (214 lb 15.2 oz)   Exam  General: Well developed, well nourished, NAD, appears stated age  64: NCAT, mucous membranes moist.   Neck: Supple  Cardiovascular: S1 S2 auscultated, RRR, no murmur  Respiratory: Clear to auscultation bilaterally with equal chest rise  Abdomen: Soft, nontender, nondistended, + bowel sounds  Extremities: warm dry without cyanosis clubbing or edema  Neuro: AAOx3, nonfocal  Psych: Normal affect and demeanor with intact judgement and insight, pleasant  Data Reviewed: I have personally reviewed following labs and imaging studies  CBC: Recent Labs  Lab 07/10/17 2357 07/11/17 0644 07/12/17 0324 07/13/17 0543  WBC 22.8* 18.2* 17.2* 11.1*  NEUTROABS 20.2* 16.9* 13.1*  --   HGB 16.7 16.0 13.8 15.0  HCT 47.9 47.1 41.9 44.9  MCV 89.7 90.2 91.5 90.9  PLT 238 262 225 423   Basic Metabolic Panel: Recent Labs  Lab 07/10/17 2357 07/11/17 0644 07/11/17 1043 07/12/17 0324  NA 141 139 139 140  K 4.4 5.6* 5.0 4.3  CL 108 105 104 102  CO2 21* 24 25 29   GLUCOSE 236* 224* 192* 114*  BUN 18 24*  24* 21  CREATININE 1.23 1.30* 1.26* 1.09  CALCIUM 9.1 9.1 9.1 8.9  MG  --   --   --  2.2   GFR: Estimated Creatinine Clearance: 72.2 mL/min (by C-G formula based on SCr of 1.09 mg/dL). Liver Function Tests: Recent Labs  Lab 07/10/17 2357 07/11/17 0644 07/11/17 1043 07/12/17 0324  AST 30 29 25 22   ALT 36 37 34 28  ALKPHOS 101 100 103 90  BILITOT 0.4 0.9 0.8 0.6  PROT 7.1 7.1 7.1 6.4*  ALBUMIN 4.1 4.0 4.0 3.8   No results for input(s): LIPASE, AMYLASE in the last 168 hours. No results for input(s):  AMMONIA in the last 168 hours. Coagulation Profile: No results for input(s): INR, PROTIME in the last 168 hours. Cardiac Enzymes: No results for input(s): CKTOTAL, CKMB, CKMBINDEX, TROPONINI in the last 168 hours. BNP (last 3 results) No results for input(s): PROBNP in the last 8760 hours. HbA1C: No results for input(s): HGBA1C in the last 72 hours. CBG: No results for input(s): GLUCAP in the last 168 hours. Lipid Profile: No results for input(s): CHOL, HDL, LDLCALC, TRIG, CHOLHDL, LDLDIRECT in the last 72 hours. Thyroid Function Tests: No results for input(s): TSH, T4TOTAL, FREET4, T3FREE, THYROIDAB in the last 72 hours. Anemia Panel: No results for input(s): VITAMINB12, FOLATE, FERRITIN, TIBC, IRON, RETICCTPCT in the last 72 hours. Urine analysis: No results found for: COLORURINE, APPEARANCEUR, LABSPEC, [place], GLUCOSEU, HGBUR, BILIRUBINUR, KETONESUR, PROTEINUR, UROBILINOGEN, NITRITE, LEUKOCYTESUR Sepsis Labs: @LABRCNTIP (procalcitonin:4,lacticidven:4)  ) Recent Results (from the past 240 hour(s))  MRSA PCR Screening     Status: None   Collection Time: 07/11/17  5:28 AM  Result Value Ref Range Status   MRSA by PCR NEGATIVE NEGATIVE Final    Comment:        The GeneXpert MRSA Assay (FDA approved for NASAL specimens only), is one component of a comprehensive MRSA colonization surveillance program. It is not intended to diagnose MRSA infection nor to guide or monitor treatment for MRSA infections. Performed at [facility], [place] [address] [place], [place] [address]       Radiology Studies: No results found.   Scheduled Meds: . amLODipine  10 mg Oral Daily  . enoxaparin (LOVENOX) injection  40 mg Subcutaneous Q24H  . famotidine  20 mg Oral BID  . finasteride  5 mg Oral Daily   Continuous Infusions: . naLOXone (NARCAN) adult infusion for OVERDOSE Stopped (07/12/17 1120)     LOS: 3 days   Time Spent in minutes   20 minutes  Hani Campusano  D.O. on 07/15/2017 at 9:38 AM  Between 7am to 7pm - Pager - [phone]  After 7pm go to [email or web address] - password [email or web address]  And look for the night coverage person covering for me after hours  [facility] Office  [phone]

## 2017-07-15 NOTE — Progress Notes (Signed)
CSW faxed patient's referral for inpatient geriatric psychiatric hospitalization to the following facilities: [facility], [facility], [facility], [facility], [facility], [facility], [facility].  CSW will continue to seek placement for patient.  Abundio Miu, [place] Social Worker [facility] Cell#: [phone]

## 2017-07-15 NOTE — Progress Notes (Signed)
CSW following to assist with discharge planning for inpatient psychiatric hospitalization admission. Per chart review, patient medically stable for transfer to inpatient psychiatric hospital.   [place] contacted [facility] and spoke with [facility]. AC agreed to review patient's referral and recommended that [place] fax patient to geriatric psychiatric facilities as they may be more appropriate for patient.   CSW will fax patient's referral to geriatric psychiatric facilities.  Abundio Miu, [place] Social Worker [facility] Cell#: [phone]

## 2017-07-16 NOTE — Progress Notes (Signed)
PROGRESS NOTE    SILVER PARKEY  KZS:[id number] DOB: [birth year] DOA: 07/10/2017 PCP: [name], [name], MD   Brief Narrative:  HPI on 07/11/2017 by Dr. Jennette Kettle Dennis Patel is a 67 y.o. male with medical history significant of PAD.  Patient apparently tried to commit suicide yesterday evening around 6:15pm by taking 50 tablets of 15 mg of immediate release morphine, and 25 tablets of 15 mg extended release morphine.  Meds belong to his wife who has dementia.  He is her caregiver.  He feels there is no way out other than to kill himself by overdosing.  Wife found him lethargic and called EMS.  Interim history Found to have overdosed on opiates.  Was placed on Narcan drip.  Psychiatry consulted. Currently patient is medically stable for discharge to inpatient psychiatry.   Assessment & Plan   Opiate overdose/suicide attempt -Patient admitted to taking his wife's morphine -Was placed on Narcan gtt, and weaned off -Mental status improved -Psychiatry consulted and appreciated, recommended inpatient psych admission- pending  -Social work consulted  BPH -Continue Proscar -Patient states he follows with Dr. Junious Silk, urology and was supposed to have an MRI of the prostate -discussed with radiology and recommended outpatient MRI due to less invasive rest  Hypertension  -Stable, was started on amiodarone  DVT Prophylaxis  lovenox  Code Status: Full  Family Communication: None at bedside  Disposition Plan: Admitted.  Patient is medically stable for discharge to inpatient psychiatry, currently pending.  Consultants Psychiatry   Procedures  None  Antibiotics   Anti-infectives (From admission, onward)   None      Subjective:   Dennis Patel seen and examined today.  No complaints today. Wonders if he could do outpatient psychiatry visits. Denies chest pain, shortness of breath, abdominal pain, N/V/D/C, headache, dizziness. Denies suicidal or homicidal  thoughts.  Objective:   Vitals:   07/15/17 0637 07/15/17 1320 07/15/17 2200 07/16/17 0645  BP: 135/76 132/83 (!) 152/77 135/81  Pulse: 66 68 73 66  Resp: 18 19 18 18   Temp: 98.3 F (36.8 C) 98.3 F (36.8 C) 98.3 F (36.8 C) 98.2 F (36.8 C)  TempSrc: Oral  Oral Oral  SpO2: 93% 96% 98% 94%  Weight:      Height:        Intake/Output Summary (Last 24 hours) at 07/16/2017 7322 Last data filed at 07/16/2017 0900 Gross per 24 hour  Intake 1260 ml  Output -  Net 1260 ml   Filed Weights   07/11/17 0522  Weight: 97.5 kg (214 lb 15.2 oz)   Exam  General: Well developed, well nourished, NAD, appears stated age  HEENT: NCAT, mucous membranes moist.   Neck: Supple  Cardiovascular: S1 S2 auscultated, RRR, no murmur  Respiratory: Clear to auscultation bilaterally with equal chest rise  Abdomen: Soft, nontender, nondistended, + bowel sounds  Extremities: warm dry without cyanosis clubbing or edema  Neuro: AAOx3, nonfocal  Psych: Pleasant, appropriate mood and affect  Data Reviewed: I have personally reviewed following labs and imaging studies  CBC: Recent Labs  Lab 07/10/17 2357 07/11/17 0644 07/12/17 0324 07/13/17 0543  WBC 22.8* 18.2* 17.2* 11.1*  NEUTROABS 20.2* 16.9* 13.1*  --   HGB 16.7 16.0 13.8 15.0  HCT 47.9 47.1 41.9 44.9  MCV 89.7 90.2 91.5 90.9  PLT 238 262 225 025   Basic Metabolic Panel: Recent Labs  Lab 07/10/17 2357 07/11/17 0644 07/11/17 1043 07/12/17 0324  NA 141 139 139 140  K 4.4 5.6*  5.0 4.3  CL 108 105 104 102  CO2 21* 24 25 29   GLUCOSE 236* 224* 192* 114*  BUN 18 24* 24* 21  CREATININE 1.23 1.30* 1.26* 1.09  CALCIUM 9.1 9.1 9.1 8.9  MG  --   --   --  2.2   GFR: Estimated Creatinine Clearance: 72.2 mL/min (by C-G formula based on SCr of 1.09 mg/dL). Liver Function Tests: Recent Labs  Lab 07/10/17 2357 07/11/17 0644 07/11/17 1043 07/12/17 0324  AST 30 29 25 22   ALT 36 37 34 28  ALKPHOS 101 100 103 90  BILITOT 0.4 0.9  0.8 0.6  PROT 7.1 7.1 7.1 6.4*  ALBUMIN 4.1 4.0 4.0 3.8   No results for input(s): LIPASE, AMYLASE in the last 168 hours. No results for input(s): AMMONIA in the last 168 hours. Coagulation Profile: No results for input(s): INR, PROTIME in the last 168 hours. Cardiac Enzymes: No results for input(s): CKTOTAL, CKMB, CKMBINDEX, TROPONINI in the last 168 hours. BNP (last 3 results) No results for input(s): PROBNP in the last 8760 hours. HbA1C: No results for input(s): HGBA1C in the last 72 hours. CBG: No results for input(s): GLUCAP in the last 168 hours. Lipid Profile: No results for input(s): CHOL, HDL, LDLCALC, TRIG, CHOLHDL, LDLDIRECT in the last 72 hours. Thyroid Function Tests: No results for input(s): TSH, T4TOTAL, FREET4, T3FREE, THYROIDAB in the last 72 hours. Anemia Panel: No results for input(s): VITAMINB12, FOLATE, FERRITIN, TIBC, IRON, RETICCTPCT in the last 72 hours. Urine analysis: No results found for: COLORURINE, APPEARANCEUR, LABSPEC, PHURINE, GLUCOSEU, HGBUR, BILIRUBINUR, KETONESUR, PROTEINUR, UROBILINOGEN, NITRITE, LEUKOCYTESUR Sepsis Labs: @LABRCNTIP (procalcitonin:4,lacticidven:4)  ) Recent Results (from the past 240 hour(s))  MRSA PCR Screening     Status: None   Collection Time: 07/11/17  5:28 AM  Result Value Ref Range Status   MRSA by PCR NEGATIVE NEGATIVE Final    Comment:        The GeneXpert MRSA Assay (FDA approved for NASAL specimens only), is one component of a comprehensive MRSA colonization surveillance program. It is not intended to diagnose MRSA infection nor to guide or monitor treatment for MRSA infections. Performed at [facility], [place] [address], [place], [place] [address]       Radiology Studies: No results found.   Scheduled Meds: . amLODipine  10 mg Oral Daily  . enoxaparin (LOVENOX) injection  40 mg Subcutaneous Q24H  . famotidine  20 mg Oral BID  . finasteride  5 mg Oral Daily   Continuous  Infusions:    LOS: 4 days   Time Spent in minutes   20 minutes  Taylr Meuth D.O. on 07/16/2017 at 9:52 AM  Between 7am to 7pm - Pager - [phone]  After 7pm go to [email or web address] - password [email or web address]  And look for the night coverage person covering for me after hours  [facility] Office  [phone]

## 2017-07-17 ENCOUNTER — Other Ambulatory Visit: Payer: Self-pay | Admitting: Urology

## 2017-07-17 ENCOUNTER — Encounter: Payer: Self-pay | Admitting: Vascular Surgery

## 2017-07-17 DIAGNOSIS — I10 Essential (primary) hypertension: Secondary | ICD-10-CM

## 2017-07-17 DIAGNOSIS — R972 Elevated prostate specific antigen [PSA]: Secondary | ICD-10-CM

## 2017-07-17 DIAGNOSIS — F330 Major depressive disorder, recurrent, mild: Secondary | ICD-10-CM

## 2017-07-17 MED ORDER — AMLODIPINE BESYLATE 10 MG PO TABS: 10.0000 mg | ORAL_TABLET | Freq: Every day | ORAL | 0 refills | Status: AC

## 2017-07-17 NOTE — Discharge Summary (Signed)
Physician Discharge Summary  Dennis Patel VEL:[id number] DOB: November 08, 1950 DOA: 07/10/2017  PCP: [name], [name], MD  Admit date: 07/10/2017 Discharge date: 07/17/2017  Time spent: 45 minutes  Recommendations for Outpatient Follow-up:  Patient will be discharged to home.  Patient will need to follow up with primary care provider within one week of discharge.  Follow up with outpatient psychiatry. Patient should continue medications as prescribed.  Patient should follow a heart healthy diet.   Discharge Diagnoses:  Opiate overdose/suicide attempt BPH Hypertension   Discharge Condition: Stable  Diet recommendation: Heart healthy  Filed Weights   07/11/17 0522  Weight: 97.5 kg (214 lb 15.2 oz)    History of present illness:  on 07/11/2017 by Dr. Roylene Reason Probertis a 67 y.o.malewith medical history significant ofPAD. Patient apparently tried to commit suicide yesterday evening around 6:15pm by taking 50 tablets of 15 mg of immediate release morphine, and 25 tablets of 15 mg extended release morphine. Meds belong to his wife who has dementia. He is her caregiver. He feels there is no way out other than to kill himself by overdosing.  Wife found him lethargic and called EMS.  Hospital Course:  Opiate overdose/suicide attempt -Patient admitted to taking his wife's morphine -Was placed on Narcan gtt, and weaned off -Mental status improved -Psychiatry initially consulted and appreciated, recommended inpatient psych admission -Social work consulted -Had long discussion with patient. He feels he can go home and try outpatient psychiatric treatment and has a good support system. No longer feels homicidal or suicidal thoughts. -Discussed this with psychiatry, Dr. Mariea Clonts, feels that patient no longer needs inpatient psychiatry and can follow up with outpatient psychiatry  BPH -Continue Proscar -Patient states he follows with Dr. Junious Silk, urology and was supposed  to have an MRI of the prostate -discussed with radiology and recommended outpatient MRI due to less invasive rest  Hypertension  -Stable, was started on amiodarone  Procedures: None  Consultations: None  Discharge Exam: Vitals:   07/16/17 2032 07/17/17 0603  BP: 121/61 128/72  Pulse: 71 (!) 59  Resp: 18 17  Temp: 97.6 F (36.4 C) 98.2 F (36.8 C)  SpO2: 100% 100%   Patient denies current chest pain, shortness of breath, abdominal pain, N/V/D/C. Wishes to go home. States he has a good support system. Denies suicidal or homicidal thoughts.   General: Well developed, well nourished, NAD, appears stated age  HEENT: NCAT, mucous membranes moist.  Neck: Supple  Cardiovascular: S1 S2 auscultated, no rubs, murmurs or gallops. Regular rate and rhythm.  Respiratory: Clear to auscultation bilaterally with equal chest rise  Abdomen: Soft, nontender, nondistended, + bowel sounds  Extremities: warm dry without cyanosis clubbing or edema  Neuro: AAOx3, nonfocal  Psych: Normal affect and demeanor with intact judgement and insight  Discharge Instructions Discharge Instructions    Discharge instructions   Complete by:  As directed    Patient will be discharged to home.  Patient will need to follow up with primary care provider within one week of discharge.  Follow up with outpatient psychiatry. Patient should continue medications as prescribed.  Patient should follow a heart healthy diet.     Allergies as of 07/17/2017   No Known Allergies     Medication List    TAKE these medications   amLODipine 10 MG tablet Commonly known as:  NORVASC Take 1 tablet (10 mg total) by mouth daily. Start taking on:  07/18/2017   aspirin EC 81 MG tablet Take 81 mg  by mouth daily.   finasteride 5 MG tablet Commonly known as:  PROSCAR Take 5 mg by mouth daily.   ibuprofen 200 MG tablet Commonly known as:  ADVIL,MOTRIN Take 400 mg by mouth every 6 (six) hours as needed for headache or  moderate pain.   ONE DAILY MULTIVITAMIN MEN PO Take 1 tablet by mouth daily.      No Known Allergies Follow-up Information    [name], [name], MD. Schedule an appointment as soon as possible for a visit in 1 week(s).   Specialty:  Family Medicine Why:  Hospital follow up Contact information: [address] [place] [address] [phone]            The results of significant diagnostics from this hospitalization (including imaging, microbiology, ancillary and laboratory) are listed below for reference.    Significant Diagnostic Studies: No results found.  Microbiology: Recent Results (from the past 240 hour(s))  MRSA PCR Screening     Status: None   Collection Time: 07/11/17  5:28 AM  Result Value Ref Range Status   MRSA by PCR NEGATIVE NEGATIVE Final    Comment:        The GeneXpert MRSA Assay (FDA approved for NASAL specimens only), is one component of a comprehensive MRSA colonization surveillance program. It is not intended to diagnose MRSA infection nor to guide or monitor treatment for MRSA infections. Performed at [facility], [place] [address] [place], [place] [address]      Labs: Basic Metabolic Panel: Recent Labs  Lab 07/10/17 2357 07/11/17 0644 07/11/17 1043 07/12/17 0324  NA 141 139 139 140  K 4.4 5.6* 5.0 4.3  CL 108 105 104 102  CO2 21* 24 25 29   GLUCOSE 236* 224* 192* 114*  BUN 18 24* 24* 21  CREATININE 1.23 1.30* 1.26* 1.09  CALCIUM 9.1 9.1 9.1 8.9  MG  --   --   --  2.2   Liver Function Tests: Recent Labs  Lab 07/10/17 2357 07/11/17 0644 07/11/17 1043 07/12/17 0324  AST 30 29 25 22   ALT 36 37 34 28  ALKPHOS 101 100 103 90  BILITOT 0.4 0.9 0.8 0.6  PROT 7.1 7.1 7.1 6.4*  ALBUMIN 4.1 4.0 4.0 3.8   No results for input(s): LIPASE, AMYLASE in the last 168 hours. No results for input(s): AMMONIA in the last 168 hours. CBC: Recent Labs  Lab 07/10/17 2357 07/11/17 0644 07/12/17 0324  07/13/17 0543  WBC 22.8* 18.2* 17.2* 11.1*  NEUTROABS 20.2* 16.9* 13.1*  --   HGB 16.7 16.0 13.8 15.0  HCT 47.9 47.1 41.9 44.9  MCV 89.7 90.2 91.5 90.9  PLT 238 262 225 206   Cardiac Enzymes: No results for input(s): CKTOTAL, CKMB, CKMBINDEX, TROPONINI in the last 168 hours. BNP: BNP (last 3 results) No results for input(s): BNP in the last 8760 hours.  ProBNP (last 3 results) No results for input(s): PROBNP in the last 8760 hours.  CBG: No results for input(s): GLUCAP in the last 168 hours.     Signed:  Cristal Ford  [facility] 07/17/2017, 1:23 PM

## 2017-07-17 NOTE — Discharge Instructions (Signed)
Drug Overdose A drug overdose happens when you take too much of a drug. An overdose can occur with illegal drugs, prescription drugs, or over-the-counter (OTC) drugs. The effects of a drug overdose can be mild, dangerous, or even deadly. What are the causes? This condition may be caused by:  Taking too much of a drug by accident.  Taking too much of a drug on purpose.  An error made by a health care provider who prescribes a drug.  An error made by the pharmacist who fills the prescription order.  Drugs that commonly cause overdose include:  Mental health drugs.  Pain medicines.  Illegal drugs.  OTC cough and cold medicines.  Heart medicines.  Seizure medicines.  What increases the risk? A drug overdose is more likely in:  Children. They may be attracted to colorful pills. Because of a child's small size, even a small amount of a drug can be dangerous.  Elderly people. They may be taking many different drugs. Elderly people may have difficulty reading labels or remembering when they last took their medicine.  The risk of a drug overdose is also higher for someone who:  Takes illegal drugs.  Takes a drug and drinks alcohol.  Has a mental health condition.  What are the signs or symptoms? Symptoms of a drug overdose depend on the drug and the amount that was taken. Common danger signs include:  Behavior changes.  Sleepiness.  Slowed breathing.  Nausea and vomiting.  Seizures.  Changes in eye pupil size. The pupil may be very large or very small.  If there are signs and symptoms of very low blood pressure (shock) from an overdose, emergency treatment is required. These include:  Cold and clammy skin.  Pale skin.  Blue lips.  Very slow breathing.  Extreme sleepiness.  Loss of consciousness.  How is this diagnosed? This condition may be diagnosed based on your symptoms. It is important to tell your health care provider:  All of the drugs that you  took.  When you took the drugs.  Whether you were drinking alcohol.  Your health care provider will do a physical exam. This exam may include:  Checking and monitoring your heart rate and rhythm, your temperature, and your blood pressure (vital signs).  Checking your breathing and oxygen level.  You may also have tests, including:  Urine tests to check for drugs in your system.  Blood tests to check for: ? Drugs in your system. ? Signs of an imbalance of your blood minerals (electrolytes). ? Liver damage. ? Kidney damage.  How is this treated? Supporting your vital signs and your breathing is the first step in treating a drug overdose. Treatment may also include:  Giving fluids and electrolytes through an IV tube.  Inserting a breathing tube (endotracheal tube) in your airway to help you breathe.  Passing a tube through your nose and into your stomach (NG tube, or nasogastric tube) to wash out your stomach.  Giving medicines that: ? Make you vomit. ? Absorb any medicine that is left in your digestive system. ? Block or reverse the effect of the drug that caused the overdose.  Filtering your blood through an artificial kidney machine (hemodialysis). You may need this if your overdose is severe or if you have kidney failure.  Ongoing counseling and mental health support if you intentionally overdosed or used an illegal drug.  Follow these instructions at home:  Take medicines only as directed by your health care provider. Always ask  your health care provider about possible side effects of any new drug that you start taking.  Keep a list of all of the drugs that you take, including over-the-counter medicines. Bring this list with you to all of your medical visits.  Drink enough fluid to keep your urine clear or pale yellow.  Keep all follow-up visits as directed by your health care provider. This is important. How is this prevented?  Get help if you are struggling  with: ? Alcohol or drug use. ? Depression or another mental health problem.  Keep the phone number of your local poison control center near your phone or on your cell phone.  Store all medicines in safety containers that are out of the reach of children.  Read the drug inserts that come with your medicines.  Do not use illegal drugs.  Do not drink alcohol when taking drugs.  Do not take medicines that are not prescribed for you. Contact a health care provider if:  Your symptoms return.  You develop new symptoms or side effects when you take medicines. Get help right away if:  You think that you or someone else may have taken too much of a drug. The hotline of the [facility] is 228 065 8845.  You or someone else is having symptoms of a drug overdose.  You have serious thoughts about hurting yourself or others.  You become confused.  You have: ? Chest pain. ? Difficulty breathing. ? A loss of consciousness. Drug overdose is an emergency. Do not wait to see if the symptoms will go away. Get medical help right away. Call your local emergency services (911 in the [place] Do not drive yourself to the hospital. This information is not intended to replace advice given to you by your health care provider. Make sure you discuss any questions you have with your health care provider. Document Released: 05/20/2014 Document Revised: 05/15/2015 Document Reviewed: 01/08/2014 Elsevier Interactive Patient Education  [employer].

## 2017-07-17 NOTE — Consult Note (Signed)
[facility] Psych Consult Progress Note  07/17/2017 10:01 AM Dennis Patel  MRN:  [id number] Subjective:   Mr. [name] was last seen on 6/28. He was admitted for suicide attempt by Morphine overdose in the setting of multiple stressors including caring for his wife with dementia.   On interview today, [name] reports that his mood is improved and describes it as "fine."  He attributes his improvement in mood to decreased stressors.  He reports that his stepdaughter is taking care of his wife and she will be living with his stepdaughter.  He believes this will be a permanent situation.  He reports a large support network.  His sister plans to monitor him for safety at discharge.  He denies SI, HI or AVH.  He reports no problems with sleep or appetite.  He denies access to guns or weapons at home.  He provides verbal consent to speak to his sister.  Patient's sister, Dennis Patel was contacted by phone.  She reports that she has no concerns for his safety and agrees to monitor him for safety as well as encourage him to attend therapy.  Principal Problem: MDD (major depressive disorder), recurrent episode, mild ([place]) Diagnosis:   Patient Active Problem List   Diagnosis Date Noted  . Opiate overdose, intentional self-harm, initial encounter ([place]) [T40.602A] 07/11/2017  . MDD (major depressive disorder), recurrent severe, without psychosis ([place]) [F33.2]    Total Time spent with patient: 15 minutes  Past Psychiatric History: Depression and alcohol abuse.   Past Medical History:  Past Medical History:  Diagnosis Date  . Claudication ([place])   . Peripheral artery disease [facility])     Past Surgical History:  Procedure Laterality Date  . ILIAC ARTERY STENT     Family History: History reviewed. No pertinent family history. Family Psychiatric  History: Denies   Social History:  Social History   Substance and Sexual Activity  Alcohol Use Never  . Frequency: Never     Social History   Substance and  Sexual Activity  Drug Use Never    Social History   Socioeconomic History  . Marital status: Married    Spouse name: Not on file  . Number of children: Not on file  . Years of education: Not on file  . Highest education level: Not on file  Occupational History  . Not on file  Social Needs  . Financial resource strain: Not on file  . Food insecurity:    Worry: Not on file    Inability: Not on file  . Transportation needs:    Medical: Not on file    Non-medical: Not on file  Tobacco Use  . Smoking status: Former Smoker    Types: Cigarettes  . Smokeless tobacco: Never Used  Substance and Sexual Activity  . Alcohol use: Never    Frequency: Never  . Drug use: Never  . Sexual activity: Not on file  Lifestyle  . Physical activity:    Days per week: Not on file    Minutes per session: Not on file  . Stress: Not on file  Relationships  . Social connections:    Talks on phone: Not on file    Gets together: Not on file    Attends religious service: Not on file    Active member of club or organization: Not on file    Attends meetings of clubs or organizations: Not on file    Relationship status: Not on file  Other Topics Concern  . Not  on file  Social History Narrative  . Not on file    Sleep: Good  Appetite:  Good  Current Medications: Current Facility-Administered Medications  Medication Dose Route Frequency Provider Last Rate Last Dose  . acetaminophen (TYLENOL) tablet 650 mg  650 mg Oral Q6H PRN Cristal Ford, DO       Or  . acetaminophen (TYLENOL) suppository 650 mg  650 mg Rectal Q6H PRN Cristal Ford, DO      . alum & mag hydroxide-simeth (MAALOX/MYLANTA) 200-200-20 MG/5ML suspension 30 mL  30 mL Oral Q4H PRN Cristal Ford, DO   30 mL at 07/11/17 1435  . amLODipine (NORVASC) tablet 10 mg  10 mg Oral Daily Cristal Ford, DO   10 mg at 07/17/17 8841  . enoxaparin (LOVENOX) injection 40 mg  40 mg Subcutaneous Q24H Mikhail, [place], DO   40 mg at  07/17/17 6606  . famotidine (PEPCID) tablet 20 mg  20 mg Oral BID Cristal Ford, DO   20 mg at 07/17/17 3016  . finasteride (PROSCAR) tablet 5 mg  5 mg Oral Daily Cristal Ford, DO   5 mg at 07/17/17 0109  . hydrALAZINE (APRESOLINE) injection 5 mg  5 mg Intravenous Q4H PRN Cristal Ford, DO   5 mg at 07/11/17 1342  . hydrocortisone cream 1 % 1 application  1 application Topical TID PRN Cristal Ford, DO      . metoprolol tartrate (LOPRESSOR) injection 2.5 mg  2.5 mg Intravenous Q4H PRN Cristal Ford, DO   2.5 mg at 07/11/17 1144  . naloxone (NARCAN) injection 0.4 mg  0.4 mg Intravenous PRN Cristal Ford, DO      . ondansetron (ZOFRAN) tablet 4 mg  4 mg Oral Q6H PRN Cristal Ford, DO       Or  . ondansetron (ZOFRAN) injection 4 mg  4 mg Intravenous Q6H PRN Cristal Ford, DO        Lab Results: No results found for this or any previous visit (from the past 48 hour(s)).  Blood Alcohol level:  Lab Results  Component Value Date   ETH <10 07/10/2017    Musculoskeletal: Strength & Muscle Tone: within normal limits Gait & Station: normal Patient leans: N/A  Psychiatric Specialty Exam: Physical Exam  Nursing note and vitals reviewed. Constitutional: He is oriented to person, place, and time. He appears well-developed and well-nourished.  HENT:  Head: Normocephalic and atraumatic.  Neck: Normal range of motion.  Respiratory: Effort normal.  Musculoskeletal: Normal range of motion.  Neurological: He is alert and oriented to person, place, and time.  Skin: No rash noted.  Psychiatric: He has a normal mood and affect. His speech is normal and behavior is normal. Judgment and thought content normal. Cognition and memory are normal.    Review of Systems  Constitutional: Negative for chills and fever.  Cardiovascular: Negative for chest pain.  Gastrointestinal: Negative for abdominal pain, constipation, diarrhea, nausea and vomiting.  Musculoskeletal:        Bilateral leg pain due to PVD. Arm pain at IV site.  Psychiatric/Behavioral: Negative for depression, hallucinations and suicidal ideas. The patient does not have insomnia.   All other systems reviewed and are negative.   Blood pressure 128/72, Patel (!) 59, temperature 98.2 F (36.8 C), temperature source Oral, resp. rate 17, height 5\' 7"  (1.702 m), weight 97.5 kg (214 lb 15.2 oz), SpO2 100 %.Body mass index is 33.67 kg/m.  General Appearance: Fairly Groomed, obese, elderly, Caucasian male, wearing a hospital gown with gray,  thinning hair who is lying in bed. NAD.   Eye Contact:  Good  Speech:  Clear and Coherent and Normal Rate  Volume:  Normal  Mood:  "Fine"  Affect:  Appropriate and Full Range  Thought Process:  Goal Directed, Linear and Descriptions of Associations: Intact  Orientation:  Full (Time, Place, and Person)  Thought Content:  Logical  Suicidal Thoughts:  No  Homicidal Thoughts:  No  Memory:  Immediate;   Good Recent;   Good Remote;   Good  Judgement:  Good  Insight:  Good  Psychomotor Activity:  Normal  Concentration:  Concentration: Good and Attention Span: Good  Recall:  Good  Fund of Knowledge:  Good  Language:  Good  Akathisia:  No  Handed:  Right  AIMS (if indicated):   N/A  Assets:  Communication Skills Desire for Improvement Housing Social Support  ADL's:  Intact  Cognition:  WNL  Sleep:   Okay   Assessment:  LEROY PETTWAY is a 67 y.o. male who was admitted with Morphine overdose in the setting of multiple stressors. He has significantly reduced stressors since his wife will now be cared for by his stepdaughter. He is future oriented and has a large support system. His sister agrees to monitor patient for safety and has no concerns for him returning home. He denies SI, HI or AVH and does not warrant inpatient psychiatric hospitalization at this time. He may benefit from speaking to a therapist about his ongoing stressors to develop healthy coping  skills to manage his stressors.    Treatment Plan Summary: -Please have unit SW provide patient with resources for therapists.  -Patient will continue to attend AA groups and use his support network. He reports having a sponsor.  -Patient is psychiatrically cleared. Psychiatry will sign off on patient at this time. Please consult psychiatry again as needed.   Faythe Dingwall, DO 07/17/2017, 10:01 AM

## 2017-07-23 ENCOUNTER — Ambulatory Visit
Admission: RE | Admit: 2017-07-23 | Discharge: 2017-07-23 | Disposition: A | Discharge status: Home / Self Care | Payer: Medicare Other | Source: Ambulatory Visit | Attending: Urology | Admitting: Urology

## 2017-07-23 DIAGNOSIS — R972 Elevated prostate specific antigen [PSA]: Secondary | ICD-10-CM

## 2017-07-23 MED ORDER — GADOBENATE DIMEGLUMINE 529 MG/ML IV SOLN
20.0000 mL | Freq: Once | INTRAVENOUS | Status: AC | PRN
  Administered 2017-07-23: 20 mL via INTRAVENOUS

## 2017-07-24 DIAGNOSIS — N400 Enlarged prostate without lower urinary tract symptoms: Secondary | ICD-10-CM | POA: Diagnosis not present

## 2017-07-24 DIAGNOSIS — I10 Essential (primary) hypertension: Secondary | ICD-10-CM | POA: Diagnosis not present

## 2017-07-24 DIAGNOSIS — I70219 Atherosclerosis of native arteries of extremities with intermittent claudication, unspecified extremity: Secondary | ICD-10-CM | POA: Diagnosis not present

## 2017-07-24 DIAGNOSIS — F430 Acute stress reaction: Secondary | ICD-10-CM | POA: Diagnosis not present

## 2017-08-08 DIAGNOSIS — F332 Major depressive disorder, recurrent severe without psychotic features: Secondary | ICD-10-CM | POA: Diagnosis not present

## 2017-08-09 ENCOUNTER — Other Ambulatory Visit: Payer: Self-pay

## 2017-08-09 DIAGNOSIS — I70219 Atherosclerosis of native arteries of extremities with intermittent claudication, unspecified extremity: Secondary | ICD-10-CM

## 2017-08-10 DIAGNOSIS — R972 Elevated prostate specific antigen [PSA]: Secondary | ICD-10-CM | POA: Diagnosis not present

## 2017-08-16 DIAGNOSIS — R972 Elevated prostate specific antigen [PSA]: Secondary | ICD-10-CM | POA: Diagnosis not present

## 2017-08-29 DIAGNOSIS — F332 Major depressive disorder, recurrent severe without psychotic features: Secondary | ICD-10-CM | POA: Diagnosis not present

## 2017-08-30 ENCOUNTER — Ambulatory Visit (HOSPITAL_COMMUNITY)
Admission: RE | Admit: 2017-08-30 | Discharge: 2017-08-30 | Disposition: A | Discharge status: Home / Self Care | Payer: Medicare Other | Source: Ambulatory Visit | Attending: Vascular Surgery | Admitting: Vascular Surgery

## 2017-08-30 ENCOUNTER — Ambulatory Visit (INDEPENDENT_AMBULATORY_CARE_PROVIDER_SITE_OTHER)
Admission: RE | Admit: 2017-08-30 | Discharge: 2017-08-30 | Disposition: A | Discharge status: Home / Self Care | Payer: Medicare Other | Source: Ambulatory Visit | Attending: Vascular Surgery | Admitting: Vascular Surgery

## 2017-08-30 DIAGNOSIS — R0989 Other specified symptoms and signs involving the circulatory and respiratory systems: Secondary | ICD-10-CM | POA: Insufficient documentation

## 2017-08-30 DIAGNOSIS — R9389 Abnormal findings on diagnostic imaging of other specified body structures: Secondary | ICD-10-CM | POA: Insufficient documentation

## 2017-08-30 DIAGNOSIS — I70219 Atherosclerosis of native arteries of extremities with intermittent claudication, unspecified extremity: Secondary | ICD-10-CM

## 2017-08-31 DIAGNOSIS — F332 Major depressive disorder, recurrent severe without psychotic features: Secondary | ICD-10-CM | POA: Diagnosis not present

## 2017-09-01 DIAGNOSIS — I10 Essential (primary) hypertension: Secondary | ICD-10-CM | POA: Diagnosis not present

## 2017-09-01 DIAGNOSIS — I70219 Atherosclerosis of native arteries of extremities with intermittent claudication, unspecified extremity: Secondary | ICD-10-CM | POA: Diagnosis not present

## 2017-09-01 DIAGNOSIS — I499 Cardiac arrhythmia, unspecified: Secondary | ICD-10-CM | POA: Diagnosis not present

## 2017-09-06 ENCOUNTER — Other Ambulatory Visit: Payer: Self-pay | Admitting: Vascular Surgery

## 2017-09-06 ENCOUNTER — Encounter: Payer: Self-pay | Admitting: Vascular Surgery

## 2017-09-06 ENCOUNTER — Other Ambulatory Visit: Payer: Self-pay

## 2017-09-06 ENCOUNTER — Ambulatory Visit (INDEPENDENT_AMBULATORY_CARE_PROVIDER_SITE_OTHER): Payer: Medicare Other | Admitting: Vascular Surgery

## 2017-09-06 VITALS — BP 136/82 | HR 84 | Temp 97.9°F | Resp 18 | Ht 70.0 in | Wt 229.0 lb

## 2017-09-06 DIAGNOSIS — I70219 Atherosclerosis of native arteries of extremities with intermittent claudication, unspecified extremity: Secondary | ICD-10-CM

## 2017-09-06 NOTE — H&P (View-Only) (Signed)
Patient name: Dennis Patel MRN: [id number] DOB: October 20, 1950 Sex: male  REASON FOR VISIT:   Follow-up of peripheral vascular disease.  HPI:   Dennis Patel is a pleasant 67 y.o. male who presents for follow-up. On 03/17/2017, the patient underwent angioplasty and stenting of the left common iliac artery with a 7 mm x 29 mm VBX covered stent.  The patient did have infrainguinal arterial occlusive disease bilaterally but I did not think he was a good candidate for an endovascular approach to this.  When I saw him last on 04/26/2017 he has been walking for 3 miles every day.  He seems very motivated.  He did have calf claudication bilaterally.  I plan on seeing him back in 6 months.  However, since that visit he noted the gradual onset of claudication in both lower extremities but especially on the left side.  This is progressed to the point where he has claudication in the left calf and a very short distance.  He can only play 12 out of 18 holes of golf using a cart.  He can only walk around his backyard partially because of his claudication symptoms.  The symptoms are mostly in the calf.  He really does not have any significant thigh or hip claudication.  He has developed some early rest pain in both feet especially on the left.  He is not a smoker.  He is on aspirin.  He is not on Plavix.  Past Medical History:  Diagnosis Date  . Atherosclerosis   . BPH (benign prostatic hyperplasia)   . Claudication ([place])   . Elevated PSA   . Family history of adverse reaction to anesthesia    ' My Brother had an allergic reaction "  . GERD (gastroesophageal reflux disease)   . History of kidney stones   . Peripheral artery disease ([place])   . Peripheral vascular disease ([place])   . Pure hypercholesterolemia   . 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 Cancer Mother   . Diabetes Mother   . CAD Father   . Diabetes Sister   . CAD Son   . Diabetes  Son   . Heart disease Son     SOCIAL HISTORY: Social History   Tobacco Use  . Smoking status: Former Smoker    Types: Cigarettes    Last attempt to quit: 2005    Years since quitting: 14.6  . Smokeless tobacco: Never Used  Substance Use Topics  . Alcohol use: Never    Frequency: Never    No Known Allergies  Current Outpatient Medications  Medication Sig Dispense Refill  . amLODipine (NORVASC) 10 MG tablet Take 1 tablet (10 mg total) by mouth daily. 30 tablet 0  . aspirin EC 81 MG tablet Take 81 mg by mouth daily.    Marland Kitchen aspirin EC 81 MG tablet Take 81 mg by mouth daily.    . finasteride (PROSCAR) 5 MG tablet Take 5 mg by mouth daily.    . fish oil-omega-3 fatty acids 1000 MG capsule Take 1 g by mouth daily.     . Multiple Vitamin (MULITIVITAMIN WITH MINERALS) TABS Take 1 tablet by mouth daily.    . Multiple Vitamins-Minerals (ONE DAILY MULTIVITAMIN MEN PO) Take 1 tablet by mouth daily.    Marland Kitchen acetaminophen (TYLENOL) 500 MG tablet Take 500 mg by mouth every 6 (six) hours as needed. For pain    .  ibuprofen (ADVIL,MOTRIN) 200 MG tablet Take 400 mg by mouth every 6 (six) hours as needed for headache or moderate pain.     No current facility-administered medications for this visit.     REVIEW OF SYSTEMS:  [X]  denotes positive finding, [ ]  denotes negative finding Cardiac  Comments:  Chest pain or chest pressure:    Shortness of breath upon exertion:    Short of breath when lying flat:    Irregular heart rhythm:        Vascular    Pain in calf, thigh, or hip brought on by ambulation:    Pain in feet at night that wakes you up from your sleep:     Blood clot in your veins:    Leg swelling:         Pulmonary    Oxygen at home:    Productive cough:     Wheezing:         Neurologic    Sudden weakness in arms or legs:     Sudden numbness in arms or legs:     Sudden onset of difficulty speaking or slurred speech:    Temporary loss of vision in one eye:     Problems with  dizziness:         Gastrointestinal    Blood in stool:     Vomited blood:         Genitourinary    Burning when urinating:     Blood in urine:        Psychiatric    Major depression:         Hematologic    Bleeding problems:    Problems with blood clotting too easily:        Skin    Rashes or ulcers:        Constitutional    Fever or chills:     PHYSICAL EXAM:   Vitals:   09/06/17 1252  BP: 136/82  Pulse: 84  Resp: 18  Temp: 97.9 F (36.6 C)  TempSrc: Oral  SpO2: 94%  Weight: 229 lb (103.9 kg)  Height: 5\' 10"  (1.778 m)    GENERAL: The patient is a well-nourished male, in no acute distress. The vital signs are documented above. CARDIAC: There is a regular rate and rhythm.  VASCULAR: I do not detect carotid bruits. On the right side he has a palpable femoral pulse.  I cannot palpate popliteal or pedal pulses. On the left side cannot palpate a femoral pulse.  I cannot palpate a popliteal or pedal pulses. PULMONARY: There is good air exchange bilaterally without wheezing or rales. ABDOMEN: Soft and non-tender with normal pitched bowel sounds.  MUSCULOSKELETAL: There are no major deformities or cyanosis. NEUROLOGIC: No focal weakness or paresthesias are detected. SKIN: There are no ulcers or rashes noted. PSYCHIATRIC: The patient has a normal affect.  DATA:    ARTERIAL DOPPLER STUDY: I have reviewed his arterial Doppler study that was done on 08/30/2017.    On the left side, which is the side with the stent, he has a dampened monophasic posterior tibial and anterior tibial signal with the Doppler.  ABI is 41%.  Toe pressure is 49 mmHg.  On the right side he has a monophasic dorsalis pedis and posterior tibial signal.  ABI is 71%.  Toe pressure is 80 mmHg.  ARTERIAL DUPLEX: I reviewed the duplex of his iliac stent that was done on 08/30/2017.  The left common iliac artery is stented but he  has monophasic waveforms in the proximal mid and distal stent.  There are  triphasic waveforms proximal to the stent.  He also has monophasic signals in the external iliac artery below the stent.  MEDICAL ISSUES:   PERIPHERAL VASCULAR DISEASE: This patient has progressive claudication of both lower extremities but especially on the left.  He potentially has some in-stent stenosis in his left common iliac artery stent.  He also has known severe infrainguinal arterial occlusive disease.  Based on his previous study he might be a candidate for a left femoral to below-knee popliteal artery bypass.  Given that he is now developed rest pain I recommend that we proceed with arteriography.  I have reviewed with the patient the indications for arteriography. In addition, I have reviewed the potential complications of arteriography including but not limited to: Bleeding, arterial injury, arterial thrombosis, dye action, renal insufficiency, or other unpredictable medical problems. I have explained to the patient that if we find disease amenable to angioplasty we could potentially address this at the same time. I have discussed the potential complications of angioplasty and stenting, including but not limited to: Bleeding, arterial thrombosis, arterial injury, dissection, or the need for surgical intervention.  This is scheduled for 09/08/2017.  If he has an in-stent stenosis I might be able to address this with angioplasty.  If the issue is mostly progression of his infrarenal disease will need to consider him for a bypass.  He does not have any significant cardiac history.  He would need preoperative vein mapping.  I will make further recommendations pending the results of his arteriogram.   Deitra Mayo [facility] [phone]

## 2017-09-06 NOTE — Progress Notes (Signed)
Patient name: Dennis Patel MRN: [id number] DOB: Nov 26, 1950 Sex: male  REASON FOR VISIT:   Follow-up of peripheral vascular disease.  HPI:   Dennis Patel is a pleasant 67 y.o. male who presents for follow-up. On 03/17/2017, the patient underwent angioplasty and stenting of the left common iliac artery with a 7 mm x 29 mm VBX covered stent.  The patient did have infrainguinal arterial occlusive disease bilaterally but I did not think he was a good candidate for an endovascular approach to this.  When I saw him last on 04/26/2017 he has been walking for 3 miles every day.  He seems very motivated.  He did have calf claudication bilaterally.  I plan on seeing him back in 6 months.  However, since that visit he noted the gradual onset of claudication in both lower extremities but especially on the left side.  This is progressed to the point where he has claudication in the left calf and a very short distance.  He can only play 12 out of 18 holes of golf using a cart.  He can only walk around his backyard partially because of his claudication symptoms.  The symptoms are mostly in the calf.  He really does not have any significant thigh or hip claudication.  He has developed some early rest pain in both feet especially on the left.  He is not a smoker.  He is on aspirin.  He is not on Plavix.  Past Medical History:  Diagnosis Date  . Atherosclerosis   . BPH (benign prostatic hyperplasia)   . Claudication ([place])   . Elevated PSA   . Family history of adverse reaction to anesthesia    ' My Brother had an allergic reaction "  . GERD (gastroesophageal reflux disease)   . History of kidney stones   . Peripheral artery disease ([place])   . Peripheral vascular disease ([place])   . Pure hypercholesterolemia   . 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 Cancer Mother   . Diabetes Mother   . CAD Father   . Diabetes Sister   . CAD Son   . Diabetes  Son   . Heart disease Son     SOCIAL HISTORY: Social History   Tobacco Use  . Smoking status: Former Smoker    Types: Cigarettes    Last attempt to quit: 2005    Years since quitting: 14.6  . Smokeless tobacco: Never Used  Substance Use Topics  . Alcohol use: Never    Frequency: Never    No Known Allergies  Current Outpatient Medications  Medication Sig Dispense Refill  . amLODipine (NORVASC) 10 MG tablet Take 1 tablet (10 mg total) by mouth daily. 30 tablet 0  . aspirin EC 81 MG tablet Take 81 mg by mouth daily.    Marland Kitchen aspirin EC 81 MG tablet Take 81 mg by mouth daily.    . finasteride (PROSCAR) 5 MG tablet Take 5 mg by mouth daily.    . fish oil-omega-3 fatty acids 1000 MG capsule Take 1 g by mouth daily.     . Multiple Vitamin (MULITIVITAMIN WITH MINERALS) TABS Take 1 tablet by mouth daily.    . Multiple Vitamins-Minerals (ONE DAILY MULTIVITAMIN MEN PO) Take 1 tablet by mouth daily.    Marland Kitchen acetaminophen (TYLENOL) 500 MG tablet Take 500 mg by mouth every 6 (six) hours as needed. For pain    .  ibuprofen (ADVIL,MOTRIN) 200 MG tablet Take 400 mg by mouth every 6 (six) hours as needed for headache or moderate pain.     No current facility-administered medications for this visit.     REVIEW OF SYSTEMS:  [X]  denotes positive finding, [ ]  denotes negative finding Cardiac  Comments:  Chest pain or chest pressure:    Shortness of breath upon exertion:    Short of breath when lying flat:    Irregular heart rhythm:        Vascular    Pain in calf, thigh, or hip brought on by ambulation:    Pain in feet at night that wakes you up from your sleep:     Blood clot in your veins:    Leg swelling:         Pulmonary    Oxygen at home:    Productive cough:     Wheezing:         Neurologic    Sudden weakness in arms or legs:     Sudden numbness in arms or legs:     Sudden onset of difficulty speaking or slurred speech:    Temporary loss of vision in one eye:     Problems with  dizziness:         Gastrointestinal    Blood in stool:     Vomited blood:         Genitourinary    Burning when urinating:     Blood in urine:        Psychiatric    Major depression:         Hematologic    Bleeding problems:    Problems with blood clotting too easily:        Skin    Rashes or ulcers:        Constitutional    Fever or chills:     PHYSICAL EXAM:   Vitals:   09/06/17 1252  BP: 136/82  Pulse: 84  Resp: 18  Temp: 97.9 F (36.6 C)  TempSrc: Oral  SpO2: 94%  Weight: 229 lb (103.9 kg)  Height: 5\' 10"  (1.778 m)    GENERAL: The patient is a well-nourished male, in no acute distress. The vital signs are documented above. CARDIAC: There is a regular rate and rhythm.  VASCULAR: I do not detect carotid bruits. On the right side he has a palpable femoral pulse.  I cannot palpate popliteal or pedal pulses. On the left side cannot palpate a femoral pulse.  I cannot palpate a popliteal or pedal pulses. PULMONARY: There is good air exchange bilaterally without wheezing or rales. ABDOMEN: Soft and non-tender with normal pitched bowel sounds.  MUSCULOSKELETAL: There are no major deformities or cyanosis. NEUROLOGIC: No focal weakness or paresthesias are detected. SKIN: There are no ulcers or rashes noted. PSYCHIATRIC: The patient has a normal affect.  DATA:    ARTERIAL DOPPLER STUDY: I have reviewed his arterial Doppler study that was done on 08/30/2017.    On the left side, which is the side with the stent, he has a dampened monophasic posterior tibial and anterior tibial signal with the Doppler.  ABI is 41%.  Toe pressure is 49 mmHg.  On the right side he has a monophasic dorsalis pedis and posterior tibial signal.  ABI is 71%.  Toe pressure is 80 mmHg.  ARTERIAL DUPLEX: I reviewed the duplex of his iliac stent that was done on 08/30/2017.  The left common iliac artery is stented but he  has monophasic waveforms in the proximal mid and distal stent.  There are  triphasic waveforms proximal to the stent.  He also has monophasic signals in the external iliac artery below the stent.  MEDICAL ISSUES:   PERIPHERAL VASCULAR DISEASE: This patient has progressive claudication of both lower extremities but especially on the left.  He potentially has some in-stent stenosis in his left common iliac artery stent.  He also has known severe infrainguinal arterial occlusive disease.  Based on his previous study he might be a candidate for a left femoral to below-knee popliteal artery bypass.  Given that he is now developed rest pain I recommend that we proceed with arteriography.  I have reviewed with the patient the indications for arteriography. In addition, I have reviewed the potential complications of arteriography including but not limited to: Bleeding, arterial injury, arterial thrombosis, dye action, renal insufficiency, or other unpredictable medical problems. I have explained to the patient that if we find disease amenable to angioplasty we could potentially address this at the same time. I have discussed the potential complications of angioplasty and stenting, including but not limited to: Bleeding, arterial thrombosis, arterial injury, dissection, or the need for surgical intervention.  This is scheduled for 09/08/2017.  If he has an in-stent stenosis I might be able to address this with angioplasty.  If the issue is mostly progression of his infrarenal disease will need to consider him for a bypass.  He does not have any significant cardiac history.  He would need preoperative vein mapping.  I will make further recommendations pending the results of his arteriogram.   Deitra Mayo [facility] [phone]

## 2017-09-08 ENCOUNTER — Observation Stay (HOSPITAL_COMMUNITY)
Admission: RE | Admit: 2017-09-08 | Discharge: 2017-09-09 | Disposition: A | Discharge status: Home / Self Care | Payer: Medicare Other | Source: Ambulatory Visit | Attending: Vascular Surgery | Admitting: Vascular Surgery

## 2017-09-08 ENCOUNTER — Encounter (HOSPITAL_COMMUNITY)
Admission: RE | Disposition: A | Discharge status: Home / Self Care | Payer: Self-pay | Source: Ambulatory Visit | Attending: Vascular Surgery

## 2017-09-08 ENCOUNTER — Telehealth: Payer: Self-pay | Admitting: Vascular Surgery

## 2017-09-08 ENCOUNTER — Other Ambulatory Visit: Payer: Self-pay

## 2017-09-08 DIAGNOSIS — K219 Gastro-esophageal reflux disease without esophagitis: Secondary | ICD-10-CM | POA: Diagnosis not present

## 2017-09-08 DIAGNOSIS — I739 Peripheral vascular disease, unspecified: Secondary | ICD-10-CM | POA: Diagnosis not present

## 2017-09-08 DIAGNOSIS — Z9889 Other specified postprocedural states: Secondary | ICD-10-CM | POA: Insufficient documentation

## 2017-09-08 DIAGNOSIS — N400 Enlarged prostate without lower urinary tract symptoms: Secondary | ICD-10-CM | POA: Insufficient documentation

## 2017-09-08 DIAGNOSIS — Z7982 Long term (current) use of aspirin: Secondary | ICD-10-CM | POA: Diagnosis not present

## 2017-09-08 DIAGNOSIS — Z87442 Personal history of urinary calculi: Secondary | ICD-10-CM | POA: Insufficient documentation

## 2017-09-08 DIAGNOSIS — E7800 Pure hypercholesterolemia, unspecified: Secondary | ICD-10-CM | POA: Diagnosis not present

## 2017-09-08 DIAGNOSIS — Z8249 Family history of ischemic heart disease and other diseases of the circulatory system: Secondary | ICD-10-CM | POA: Diagnosis not present

## 2017-09-08 DIAGNOSIS — Z79899 Other long term (current) drug therapy: Secondary | ICD-10-CM | POA: Diagnosis not present

## 2017-09-08 DIAGNOSIS — Z87891 Personal history of nicotine dependence: Secondary | ICD-10-CM | POA: Insufficient documentation

## 2017-09-08 DIAGNOSIS — T148XXA Other injury of unspecified body region, initial encounter: Secondary | ICD-10-CM | POA: Diagnosis present

## 2017-09-08 DIAGNOSIS — I70212 Atherosclerosis of native arteries of extremities with intermittent claudication, left leg: Principal | ICD-10-CM | POA: Insufficient documentation

## 2017-09-08 HISTORY — PX: PERIPHERAL VASCULAR INTERVENTION: CATH118257

## 2017-09-08 HISTORY — PX: ABDOMINAL AORTOGRAM W/LOWER EXTREMITY: CATH118223

## 2017-09-08 LAB — POCT I-STAT, CHEM 8
BUN: 22 mg/dL (ref 8–23)
CREATININE: 1.1 mg/dL (ref 0.61–1.24)
Calcium, Ion: 1.16 mmol/L (ref 1.15–1.40)
Chloride: 103 mmol/L (ref 98–111)
Glucose, Bld: 147 mg/dL — ABNORMAL HIGH (ref 70–99)
HEMATOCRIT: 44 % (ref 39.0–52.0)
HEMOGLOBIN: 15 g/dL (ref 13.0–17.0)
POTASSIUM: 4.6 mmol/L (ref 3.5–5.1)
SODIUM: 139 mmol/L (ref 135–145)
TCO2: 28 mmol/L (ref 22–32)

## 2017-09-08 LAB — POCT ACTIVATED CLOTTING TIME
ACTIVATED CLOTTING TIME: 224 s
ACTIVATED CLOTTING TIME: 186 s
Activated Clotting Time: 224 seconds

## 2017-09-08 SURGERY — ABDOMINAL AORTOGRAM W/LOWER EXTREMITY
Anesthesia: LOCAL

## 2017-09-08 MED ORDER — ONDANSETRON HCL 4 MG/2ML IJ SOLN: 4.0000 mg | Freq: Four times a day (QID) | INTRAMUSCULAR | Status: DC | PRN

## 2017-09-08 MED ORDER — SODIUM CHLORIDE 0.9% FLUSH: 3.0000 mL | INTRAVENOUS | Status: DC | PRN

## 2017-09-08 MED ORDER — SODIUM CHLORIDE 0.9 % IV SOLN
INTRAVENOUS | Status: DC
  Administered 2017-09-08: 08:00:00 via INTRAVENOUS

## 2017-09-08 MED ORDER — CLOPIDOGREL BISULFATE 75 MG PO TABS
75.0000 mg | ORAL_TABLET | Freq: Every day | ORAL | Status: DC
  Filled 2017-09-08: qty 1

## 2017-09-08 MED ORDER — CLOPIDOGREL BISULFATE 300 MG PO TABS
ORAL_TABLET | ORAL | Status: AC
  Filled 2017-09-08: qty 1

## 2017-09-08 MED ORDER — SODIUM CHLORIDE 0.9 % IV SOLN: 250.0000 mL | INTRAVENOUS | Status: DC | PRN

## 2017-09-08 MED ORDER — LIDOCAINE HCL (PF) 1 % IJ SOLN
INTRAMUSCULAR | Status: AC
  Filled 2017-09-08: qty 30

## 2017-09-08 MED ORDER — HEPARIN SODIUM (PORCINE) 1000 UNIT/ML IJ SOLN
INTRAMUSCULAR | Status: AC
  Filled 2017-09-08: qty 1

## 2017-09-08 MED ORDER — ATORVASTATIN CALCIUM 10 MG PO TABS
10.0000 mg | ORAL_TABLET | Freq: Every day | ORAL | Status: DC
  Administered 2017-09-08: 10 mg via ORAL
  Filled 2017-09-08 (×2): qty 1

## 2017-09-08 MED ORDER — FENTANYL CITRATE (PF) 100 MCG/2ML IJ SOLN
INTRAMUSCULAR | Status: AC
  Filled 2017-09-08: qty 2

## 2017-09-08 MED ORDER — CLOPIDOGREL BISULFATE 300 MG PO TABS
ORAL_TABLET | ORAL | Status: DC | PRN
  Administered 2017-09-08: 300 mg via ORAL

## 2017-09-08 MED ORDER — HEPARIN (PORCINE) IN NACL 1000-0.9 UT/500ML-% IV SOLN
INTRAVENOUS | Status: AC
  Filled 2017-09-08: qty 1000

## 2017-09-08 MED ORDER — FENTANYL CITRATE (PF) 100 MCG/2ML IJ SOLN
INTRAMUSCULAR | Status: DC | PRN
  Administered 2017-09-08: 50 ug via INTRAVENOUS

## 2017-09-08 MED ORDER — MIDAZOLAM HCL 2 MG/2ML IJ SOLN
INTRAMUSCULAR | Status: DC | PRN
  Administered 2017-09-08: 1 mg via INTRAVENOUS

## 2017-09-08 MED ORDER — CLOPIDOGREL BISULFATE 75 MG PO TABS: 75.0000 mg | ORAL_TABLET | Freq: Every day | ORAL | 11 refills | Status: DC

## 2017-09-08 MED ORDER — ACETAMINOPHEN 325 MG PO TABS: 650.0000 mg | ORAL_TABLET | ORAL | Status: DC | PRN

## 2017-09-08 MED ORDER — LABETALOL HCL 5 MG/ML IV SOLN: 10.0000 mg | INTRAVENOUS | Status: DC | PRN

## 2017-09-08 MED ORDER — HEPARIN SODIUM (PORCINE) 1000 UNIT/ML IJ SOLN
INTRAMUSCULAR | Status: DC | PRN
  Administered 2017-09-08: 10000 [IU] via INTRAVENOUS

## 2017-09-08 MED ORDER — ATORVASTATIN CALCIUM 10 MG PO TABS: 10.0000 mg | ORAL_TABLET | Freq: Every day | ORAL | 11 refills | Status: DC

## 2017-09-08 MED ORDER — HYDRALAZINE HCL 20 MG/ML IJ SOLN: 5.0000 mg | INTRAMUSCULAR | Status: DC | PRN

## 2017-09-08 MED ORDER — HEPARIN (PORCINE) IN NACL 1000-0.9 UT/500ML-% IV SOLN
INTRAVENOUS | Status: DC | PRN
  Administered 2017-09-08 (×2): 500 mL

## 2017-09-08 MED ORDER — FAMOTIDINE IN NACL 20-0.9 MG/50ML-% IV SOLN
20.0000 mg | Freq: Once | INTRAVENOUS | Status: AC
  Administered 2017-09-08: 20 mg via INTRAVENOUS
  Filled 2017-09-08 (×2): qty 50

## 2017-09-08 MED ORDER — MIDAZOLAM HCL 2 MG/2ML IJ SOLN
INTRAMUSCULAR | Status: AC
  Filled 2017-09-08: qty 2

## 2017-09-08 MED ORDER — IODIXANOL 320 MG/ML IV SOLN
INTRAVENOUS | Status: DC | PRN
  Administered 2017-09-08: 135 mL via INTRA_ARTERIAL

## 2017-09-08 MED ORDER — SODIUM CHLORIDE 0.9 % WEIGHT BASED INFUSION: 1.0000 mL/kg/h | INTRAVENOUS | Status: AC

## 2017-09-08 MED ORDER — SODIUM CHLORIDE 0.9% FLUSH
3.0000 mL | Freq: Two times a day (BID) | INTRAVENOUS | Status: DC
  Administered 2017-09-08: 3 mL via INTRAVENOUS

## 2017-09-08 MED ORDER — LIDOCAINE HCL (PF) 1 % IJ SOLN
INTRAMUSCULAR | Status: DC | PRN
  Administered 2017-09-08: 20 mL

## 2017-09-08 SURGICAL SUPPLY — 19 items
BALLN MUSTANG 8X20X75 (BALLOONS) ×3
BALLOON MUSTANG 8X20X75 (BALLOONS) ×2 IMPLANT
CATH ANGIO 5F PIGTAIL 65CM (CATHETERS) ×3 IMPLANT
GUIDEWIRE ANGLED .035X150CM (WIRE) ×3 IMPLANT
KIT ENCORE 26 ADVANTAGE (KITS) ×6 IMPLANT
KIT MICROPUNCTURE NIT STIFF (SHEATH) ×3 IMPLANT
KIT PV (KITS) ×3 IMPLANT
SHEATH BRITE TIP 7FR 35CM (SHEATH) ×3 IMPLANT
SHEATH PINNACLE 5F 10CM (SHEATH) ×3 IMPLANT
SHEATH PROBE COVER 6X72 (BAG) ×3 IMPLANT
STENT VIABAHN 8X39X80 VBX (Permanent Stent) ×3 IMPLANT
STOPCOCK MORSE 400PSI 3WAY (MISCELLANEOUS) ×3 IMPLANT
SYRINGE MEDRAD AVANTA MACH 7 (SYRINGE) ×3 IMPLANT
TAPE VIPERTRACK RADIOPAQ (MISCELLANEOUS) ×2 IMPLANT
TAPE VIPERTRACK RADIOPAQUE (MISCELLANEOUS) ×1
TRANSDUCER W/STOPCOCK (MISCELLANEOUS) ×3 IMPLANT
TRAY PV CATH (CUSTOM PROCEDURE TRAY) ×3 IMPLANT
TUBING CIL FLEX 10 FLL-RA (TUBING) ×3 IMPLANT
WIRE HITORQ VERSACORE ST 145CM (WIRE) ×3 IMPLANT

## 2017-09-08 NOTE — Interval H&P Note (Signed)
History and Physical Interval Note:  09/08/2017 10:36 AM  Dennis Patel  has presented today for surgery, with the diagnosis of pvd  The various methods of treatment have been discussed with the patient and family. After consideration of risks, benefits and other options for treatment, the patient has consented to  Procedure(s): ABDOMINAL AORTOGRAM W/LOWER EXTREMITY (N/A) as a surgical intervention .  The patient's history has been reviewed, patient examined, no change in status, stable for surgery.  I have reviewed the patient's chart and labs.  Questions were answered to the patient's satisfaction.     Deitra Mayo

## 2017-09-08 NOTE — Progress Notes (Addendum)
Up and walked to bathroom and when walked back to room c/o groin pain with walking and hematoma noted; pressure held left groin x 10min and groin softer; Dr Donzetta Matters notified and orders noted

## 2017-09-08 NOTE — Telephone Encounter (Signed)
sch appt spk to pt mld ltr 10/11/17 10am ABI 11am p/o MD

## 2017-09-08 NOTE — Progress Notes (Signed)
Site area: LFA Site Prior to Removal:  Level 0 Pressure Applied For:65 min Manual:   yes Patient Status During Pull:  stable Post Pull Site:  Level 0 Post Pull Instructions Given:  yes Post Pull Pulses Present: doppler Dressing Applied:  clear Bedrest begins @ 1425 till 2025   Comments:

## 2017-09-08 NOTE — Op Note (Signed)
PATIENT: Dennis Patel      MRN: [id number] DOB: January 24, 1950    DATE OF PROCEDURE: 09/08/2017  INDICATIONS:    Dennis Patel is a 67 y.o. male who underwent previous angioplasty and stenting of a left common iliac artery stenosis 6 months ago.  He presented with recurrent claudication in the left lower extremity and had monophasic flow in his stent.  He presents for arteriography.  PROCEDURE:    1.  Conscious sedation 2.  Ultrasound-guided access to the left common femoral artery 3.  Aortogram with iliac arteriogram 4.  Angioplasty and stenting of the left common iliac artery (8 mm x 39 millimeter VBX stent) 5.  Left lower extremity runoff  SURGEON: Judeth Cornfield. Scot Dock, MD, FACS  ANESTHESIA: Local with sedation  EBL: Minimal  TECHNIQUE: The patient was brought to the peripheral vascular lab and was sedated. The period of conscious sedation was 72 minutes.  During that time period, I was present face-to-face 100% of the time.  The patient was administered 1 mg of Versed and 50 mcg of fentanyl. The patient's heart rate, blood pressure, and oxygen saturation were monitored by the nurse continuously during the procedure.  Both groins were prepped and draped in the usual sterile fashion.  Under ultrasound guidance, after the skin was anesthetized, I cannulated the left common femoral artery with a micropuncture needle and a micropuncture sheath was introduced over a wire.  This was exchanged for a 5 [place] sheath over a Bentson wire.  By ultrasound the femoral artery was patent. A real-time image was obtained and placed in the chart.   A pigtail catheter was positioned at the L1 vertebral body and flush aortogram obtained.  Of note I was able to get the wire through the stent with slight difficulty.  Arteriogram showed that the stent was occluded.  I elected to address this with a covered stent.  I exchange the 5 [place] sheath for a 7 French sheath and the patient was then heparinized.   ACT was monitored throughout the case.  Once the ACT was therapeutic the sheath was advanced through the stent.  The 8 mm x 39 mm covered stent was positioned to extend beyond the previous stent and below the previous stent and then the sheath was retracted.  The balloon was then inflated while I aspirated on the side-port to try to retrieve any clot.  Of note no clot was retrieved.  I inflated to 16atm which took the covered stent to 8 mm in diameter.  I then went back with an 8 mm x 2 cm balloon and inflated this within the stent.  There is no residual stenosis.  Excellent a retrograde left femoral arteriogram was then obtained with left lower extremity runoff.  Patient was transferred to the recovery room.  No immediate complications were noted.  FINDINGS:   1. No significant renal artery stenosis identified.  The infrarenal aorta is patent.  The right common iliac and external iliac arteries are patent. 2.  On the left side, the occluded left common iliac artery stent was successfully addressed with a covered stent as described above.  There was no residual stenosis.  The patient was loaded with Plavix. 3.  Below the stent the external iliac artery is patent as is the hypogastric artery.  The common femoral, superficial femoral, deep femoral, popliteal, anterior tibial, posterior tibial, and peroneal arteries are patent.  CLINICAL NOTE: The patient will be started on Plavix.  PRE: 100% POST: 0 %  STENT: 8 X [employer], MD, [facility]  DATE OF DICTATION:   09/08/2017

## 2017-09-09 DIAGNOSIS — Z79899 Other long term (current) drug therapy: Secondary | ICD-10-CM | POA: Diagnosis not present

## 2017-09-09 DIAGNOSIS — I70212 Atherosclerosis of native arteries of extremities with intermittent claudication, left leg: Secondary | ICD-10-CM | POA: Diagnosis not present

## 2017-09-09 DIAGNOSIS — Z7982 Long term (current) use of aspirin: Secondary | ICD-10-CM | POA: Diagnosis not present

## 2017-09-09 DIAGNOSIS — E7800 Pure hypercholesterolemia, unspecified: Secondary | ICD-10-CM | POA: Diagnosis not present

## 2017-09-09 DIAGNOSIS — Z9889 Other specified postprocedural states: Secondary | ICD-10-CM | POA: Diagnosis not present

## 2017-09-09 DIAGNOSIS — N400 Enlarged prostate without lower urinary tract symptoms: Secondary | ICD-10-CM | POA: Diagnosis not present

## 2017-09-09 DIAGNOSIS — Z87442 Personal history of urinary calculi: Secondary | ICD-10-CM | POA: Diagnosis not present

## 2017-09-09 DIAGNOSIS — Z8249 Family history of ischemic heart disease and other diseases of the circulatory system: Secondary | ICD-10-CM | POA: Diagnosis not present

## 2017-09-09 DIAGNOSIS — K219 Gastro-esophageal reflux disease without esophagitis: Secondary | ICD-10-CM | POA: Diagnosis not present

## 2017-09-09 DIAGNOSIS — Z87891 Personal history of nicotine dependence: Secondary | ICD-10-CM | POA: Diagnosis not present

## 2017-09-09 LAB — CBC WITH DIFFERENTIAL/PLATELET
Abs Immature Granulocytes: 0.1 10*3/uL (ref 0.0–0.1)
BASOS ABS: 0 10*3/uL (ref 0.0–0.1)
Basophils Relative: 0 %
EOS ABS: 0.1 10*3/uL (ref 0.0–0.7)
EOS PCT: 1 %
HCT: 40.4 % (ref 39.0–52.0)
HEMOGLOBIN: 13.7 g/dL (ref 13.0–17.0)
IMMATURE GRANULOCYTES: 1 %
LYMPHS ABS: 1.8 10*3/uL (ref 0.7–4.0)
LYMPHS PCT: 18 %
MCH: 30.1 pg (ref 26.0–34.0)
MCHC: 33.9 g/dL (ref 30.0–36.0)
MCV: 88.8 fL (ref 78.0–100.0)
Monocytes Absolute: 1.2 10*3/uL — ABNORMAL HIGH (ref 0.1–1.0)
Monocytes Relative: 12 %
NEUTROS PCT: 68 %
Neutro Abs: 6.7 10*3/uL (ref 1.7–7.7)
Platelets: 205 10*3/uL (ref 150–400)
RBC: 4.55 MIL/uL (ref 4.22–5.81)
RDW: 12.4 % (ref 11.5–15.5)
WBC: 9.8 10*3/uL (ref 4.0–10.5)

## 2017-09-09 NOTE — Progress Notes (Signed)
  Progress Note    09/09/2017 9:24 AM 1 Day Post-Op  Subjective: No overnight issues  Vitals:   09/08/17 2323 09/09/17 0806  BP: (!) 141/70 (!) 144/78  Pulse: 68   Resp: 18   Temp: 98.9 F (37.2 C) 98.8 F (37.1 C)  SpO2: 99% 94%    Physical Exam: Awake alert oriented Abdomen is soft nontender nondistended Left groin with hematoma There is no extensive left groin pulsatility I can palpate a left popliteal pulse  CBC    Component Value Date/Time   WBC 9.8 09/09/2017 0256   RBC 4.55 09/09/2017 0256   HGB 13.7 09/09/2017 0256   HCT 40.4 09/09/2017 0256   PLT 205 09/09/2017 0256   MCV 88.8 09/09/2017 0256   MCH 30.1 09/09/2017 0256   MCHC 33.9 09/09/2017 0256   RDW 12.4 09/09/2017 0256   LYMPHSABS 1.8 09/09/2017 0256   MONOABS 1.2 (H) 09/09/2017 0256   EOSABS 0.1 09/09/2017 0256   BASOSABS 0.0 09/09/2017 0256    BMET    Component Value Date/Time   NA 139 09/08/2017 0828   K 4.6 09/08/2017 0828   CL 103 09/08/2017 0828   CO2 29 07/12/2017 0324   GLUCOSE 147 (H) 09/08/2017 0828   BUN 22 09/08/2017 0828   CREATININE 1.10 09/08/2017 0828   CALCIUM 8.9 07/12/2017 0324   GFRNONAA >60 07/12/2017 0324   GFRAA >60 07/12/2017 0324    INR No results found for: INR   Intake/Output Summary (Last 24 hours) at 09/09/2017 0924 Last data filed at 09/08/2017 2322 Gross per 24 hour  Intake -  Output 400 ml  Net -400 ml     Assessment/plan:  67 y.o. male is s/p left common iliac artery stenting for occluded previous stent.  He had bleeding from his groin post procedure and stayed overnight for observation.  I cannot palpate a pseudoaneurysm in his groin this morning and he does have a good popliteal pulse on the left.  He was bolused with Plavix yesterday and is to start Plavix but has a prostate biopsy on Thursday morning for which he will need to hold it along with aspirin.  He will plan to restart both of these medications on Friday after the procedure.  He has  follow-up scheduled with Dr. Scot Dock.  If he has further left groin issues we will need to see him sooner and I have instructed him on this.  He demonstrates good understanding and is okay for discharge today.     Brandon C. Donzetta Matters, MD [facility] [place] Office: [phone] Pager: [phone]  09/09/2017 9:24 AM

## 2017-09-09 NOTE — Progress Notes (Signed)
Pt reminded to hold ASA/Plavix doses until after his procedure Friday (per MD); Iv removed; belongings collected; educated on d/c instructions; will wheel out once ride has arrived.   [place]  Kiyani Jernigan, RN

## 2017-09-09 NOTE — Plan of Care (Signed)

## 2017-09-09 NOTE — Discharge Summary (Signed)
   Physician Discharge Summary  Patient ID: Dennis Patel MRN: [id number] DOB/AGE: 69-16-1952 69 y.o.  Admit date: 09/08/2017 Discharge date: 09/09/2017  Admission Diagnosis: Peripheral arterial disease with short distance life limiting claudication on the left  Discharge Diagnoses:  Same  Secondary Diagnoses: Active Problems:   Hematoma   Procedures: 1.  Conscious sedation 2.  Ultrasound-guided access to the left common femoral artery 3.  Aortogram with iliac arteriogram 4.  Angioplasty and stenting of the left common iliac artery (8 mm x 39 millimeter VBX stent) 5.  Left lower extremity runoff  Discharged Condition: good  Hospital Course: Patient underwent his procedure that was planned to be outpatient but upon walking prior to discharge he did develop some groin bleeding and pressure was held until hemostasis obtained.  He did develop a hematoma although there was no palpable mass to suggest pseudoaneurysm.  He had a palpable popliteal pulse on exam the following day and was thought to be good for discharge.  Consults:  None  Significant Diagnostic Studies: CBC CBC Latest Ref Rng & Units 09/09/2017 09/08/2017 07/13/2017  WBC 4.0 - 10.5 K/uL 9.8 - 11.1(H)  Hemoglobin 13.0 - 17.0 g/dL 13.7 15.0 15.0  Hematocrit 39.0 - 52.0 % 40.4 44.0 44.9  Platelets 150 - 400 K/uL 205 - 206       Disposition:  Home  Discharge Instructions    Call MD for:  redness, tenderness, or signs of infection (pain, swelling, bleeding, redness, odor or green/yellow discharge around incision site)   Complete by:  As directed    Call MD for:  severe or increased pain, loss or decreased feeling  in affected limb(s)   Complete by:  As directed    Call MD for:  temperature >100.5   Complete by:  As directed    Resume previous diet   Complete by:  As directed      Allergies as of 09/09/2017   No Known Allergies     Medication List    TAKE these medications   amLODipine 10 MG  tablet Commonly known as:  NORVASC Take 1 tablet (10 mg total) by mouth daily.   aspirin EC 81 MG tablet Take 81 mg by mouth daily.   atorvastatin 10 MG tablet Commonly known as:  LIPITOR Take 1 tablet (10 mg total) by mouth daily.   clopidogrel 75 MG tablet Commonly known as:  PLAVIX Take 1 tablet (75 mg total) by mouth daily.   famotidine 10 MG tablet Commonly known as:  PEPCID Take 10 mg by mouth daily.   finasteride 5 MG tablet Commonly known as:  PROSCAR Take 5 mg by mouth daily.   hydrochlorothiazide 12.5 MG tablet Commonly known as:  HYDRODIURIL Take 12.5 mg by mouth daily.   ibuprofen 200 MG tablet Commonly known as:  ADVIL,MOTRIN Take 200 mg by mouth every 6 (six) hours as needed for headache or moderate pain.   ONE DAILY MULTIVITAMIN MEN PO Take 1 tablet by mouth daily.        Signed: Eda Paschal. Donzetta Matters, MD [facility] [place] Office: [phone] Pager: [phone]   09/09/2017, 6:27 PM

## 2017-09-09 NOTE — Discharge Instructions (Signed)

## 2017-09-11 ENCOUNTER — Encounter (HOSPITAL_COMMUNITY): Payer: Self-pay | Admitting: Vascular Surgery

## 2017-09-12 ENCOUNTER — Other Ambulatory Visit: Payer: Self-pay

## 2017-09-12 DIAGNOSIS — I70219 Atherosclerosis of native arteries of extremities with intermittent claudication, unspecified extremity: Secondary | ICD-10-CM

## 2017-09-14 ENCOUNTER — Encounter: Payer: Self-pay | Admitting: Vascular Surgery

## 2017-09-14 DIAGNOSIS — R972 Elevated prostate specific antigen [PSA]: Secondary | ICD-10-CM | POA: Diagnosis not present

## 2017-09-15 ENCOUNTER — Encounter (HOSPITAL_COMMUNITY): Payer: Self-pay | Admitting: Vascular Surgery

## 2017-09-25 DIAGNOSIS — D485 Neoplasm of uncertain behavior of skin: Secondary | ICD-10-CM | POA: Diagnosis not present

## 2017-09-25 DIAGNOSIS — Z85828 Personal history of other malignant neoplasm of skin: Secondary | ICD-10-CM | POA: Diagnosis not present

## 2017-09-25 DIAGNOSIS — L821 Other seborrheic keratosis: Secondary | ICD-10-CM | POA: Diagnosis not present

## 2017-09-25 DIAGNOSIS — Z85820 Personal history of malignant melanoma of skin: Secondary | ICD-10-CM | POA: Diagnosis not present

## 2017-10-11 ENCOUNTER — Encounter: Payer: Self-pay | Admitting: Vascular Surgery

## 2017-10-11 ENCOUNTER — Other Ambulatory Visit: Payer: Self-pay

## 2017-10-11 ENCOUNTER — Ambulatory Visit (HOSPITAL_COMMUNITY)
Admission: RE | Admit: 2017-10-11 | Discharge: 2017-10-11 | Disposition: A | Discharge status: Home / Self Care | Payer: Medicare Other | Source: Ambulatory Visit | Attending: Family | Admitting: Family

## 2017-10-11 ENCOUNTER — Ambulatory Visit (INDEPENDENT_AMBULATORY_CARE_PROVIDER_SITE_OTHER): Payer: Medicare Other | Admitting: Vascular Surgery

## 2017-10-11 VITALS — BP 146/86 | HR 64 | Temp 97.4°F | Resp 16 | Ht 70.0 in | Wt 231.0 lb

## 2017-10-11 DIAGNOSIS — I70219 Atherosclerosis of native arteries of extremities with intermittent claudication, unspecified extremity: Secondary | ICD-10-CM | POA: Diagnosis not present

## 2017-10-11 NOTE — Progress Notes (Signed)
Patient name: Dennis Patel MRN: [id number] DOB: 1950-02-10 Sex: male  REASON FOR VISIT:   Follow-up after angioplasty and stenting of the left common iliac artery.  HPI:   Dennis Patel is a pleasant 67 y.o. male who is undergone angioplasty and stenting of the left common iliac artery on 03/17/2017.  He developed a recurrent stenosis.  On 09/08/2017 he underwent angioplasty and stenting of a recurrent left common iliac artery stenosis.   This was done with an 8 mm x 39 mm VBX stent.  There was no significant iliac disease on the right.  On the left he did not have any significant infrainguinal arterial occlusive disease.  Since I saw him last he continues to have some stable bilateral lower extremity claudication but he is walking further than before.  He denies any history of rest pain.  He occasionally gets cramps in his feet at night.  He is not a smoker.  He is on aspirin and a statin.  We added Plavix given his recurrent stenosis.  Current Outpatient Medications  Medication Sig Dispense Refill  . amLODipine (NORVASC) 10 MG tablet Take 1 tablet (10 mg total) by mouth daily. 30 tablet 0  . aspirin EC 81 MG tablet Take 81 mg by mouth daily.    Marland Kitchen atorvastatin (LIPITOR) 10 MG tablet Take 1 tablet (10 mg total) by mouth daily. 30 tablet 11  . clopidogrel (PLAVIX) 75 MG tablet Take 1 tablet (75 mg total) by mouth daily. 30 tablet 11  . famotidine (PEPCID) 10 MG tablet Take 10 mg by mouth daily.    . finasteride (PROSCAR) 5 MG tablet Take 5 mg by mouth daily.    . hydrochlorothiazide (HYDRODIURIL) 12.5 MG tablet Take 12.5 mg by mouth daily.  12  . ibuprofen (ADVIL,MOTRIN) 200 MG tablet Take 200 mg by mouth every 6 (six) hours as needed for headache or moderate pain.     . Multiple Vitamins-Minerals (ONE DAILY MULTIVITAMIN MEN PO) Take 1 tablet by mouth daily.     No current facility-administered medications for this visit.     REVIEW OF SYSTEMS:  [X]  denotes positive finding, [ ]   denotes negative finding Vascular    Leg swelling    Cardiac    Chest pain or chest pressure:    Shortness of breath upon exertion:    Short of breath when lying flat:    Irregular heart rhythm:    Constitutional    Fever or chills:     PHYSICAL EXAM:   Vitals:   10/11/17 1046  BP: (!) 146/86  Pulse: 64  Resp: 16  Temp: (!) 97.4 F (36.3 C)  TempSrc: Oral  SpO2: 97%  Weight: 231 lb (104.8 kg)  Height: 5\' 10"  (1.778 m)    GENERAL: The patient is a well-nourished male, in no acute distress. The vital signs are documented above. CARDIOVASCULAR: There is a regular rate and rhythm. PULMONARY: There is good air exchange bilaterally without wheezing or rales. VASCULAR: I do not detect carotid bruits. He has a palpable right femoral pulse.  I cannot palpate a left femoral pulses he has some hematoma in the left groin. Both feet are warm and well-perfused.  DATA:   ARTERIAL DOPPLER STUDY: I have independently interpreted his arterial Doppler study.  On the right side he has a monophasic dorsalis pedis and posterior tibial signal.  ABI is 68%.  Toe pressure on the right is 69 mmHg.  On the left side he has a  biphasic posterior tibial signal with a monophasic dorsalis pedis signal.  His ABI on the left is 74% which has improved from 41% prior to the angioplasty and stenting.  Toe pressure on the left is 84 mmHg.  MEDICAL ISSUES:   STATUS POST ANGIOPLASTY AND STENTING OF THE LEFT COMMON ILIAC ARTERY: Patient is doing well status post angioplasty and stenting of an occluded left common iliac artery recurrent stenosis.  He is on aspirin, a statin, and Plavix.  He seems highly motivated and is walking quite a bit.  Both we have also discussed the importance of nutrition.  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  I have ordered a follow-up duplex of his iliac stent and ABIs in 6 months and I will see him back at that time.  He knows to call sooner if he has  problems.  Deitra Mayo [facility] [phone]

## 2017-10-25 ENCOUNTER — Encounter (HOSPITAL_COMMUNITY): Payer: Medicare Other

## 2017-10-25 ENCOUNTER — Ambulatory Visit: Payer: Medicare Other | Admitting: Vascular Surgery

## 2017-10-26 DIAGNOSIS — R972 Elevated prostate specific antigen [PSA]: Secondary | ICD-10-CM | POA: Diagnosis not present

## 2017-11-01 DIAGNOSIS — R350 Frequency of micturition: Secondary | ICD-10-CM | POA: Diagnosis not present

## 2017-11-01 DIAGNOSIS — N401 Enlarged prostate with lower urinary tract symptoms: Secondary | ICD-10-CM | POA: Diagnosis not present

## 2017-11-01 DIAGNOSIS — R972 Elevated prostate specific antigen [PSA]: Secondary | ICD-10-CM | POA: Diagnosis not present

## 2017-12-01 DIAGNOSIS — N400 Enlarged prostate without lower urinary tract symptoms: Secondary | ICD-10-CM | POA: Diagnosis not present

## 2017-12-01 DIAGNOSIS — Z0000 Encounter for general adult medical examination without abnormal findings: Secondary | ICD-10-CM | POA: Diagnosis not present

## 2017-12-01 DIAGNOSIS — E7800 Pure hypercholesterolemia, unspecified: Secondary | ICD-10-CM | POA: Diagnosis not present

## 2017-12-01 DIAGNOSIS — I10 Essential (primary) hypertension: Secondary | ICD-10-CM | POA: Diagnosis not present

## 2017-12-05 DIAGNOSIS — C61 Malignant neoplasm of prostate: Secondary | ICD-10-CM | POA: Diagnosis not present

## 2017-12-05 DIAGNOSIS — R972 Elevated prostate specific antigen [PSA]: Secondary | ICD-10-CM | POA: Diagnosis not present

## 2017-12-12 ENCOUNTER — Other Ambulatory Visit: Payer: Self-pay | Admitting: Urology

## 2017-12-12 DIAGNOSIS — C61 Malignant neoplasm of prostate: Secondary | ICD-10-CM

## 2017-12-19 DIAGNOSIS — C61 Malignant neoplasm of prostate: Secondary | ICD-10-CM | POA: Diagnosis not present

## 2017-12-21 ENCOUNTER — Telehealth: Payer: Self-pay | Admitting: Medical Oncology

## 2017-12-21 NOTE — Telephone Encounter (Signed)
I called pt to introduce myself as the Prostate Nurse Navigator and the Coordinator of the Prostate [place].  1. I confirmed with the patient he is aware of his referral to the clinic 12/29/2017 arriving at 8:00 am.  2. I discussed the format of the clinic and the physicians he will be seeing that day.  3. I discussed where the clinic is located and how to contact me.  4. I confirmed his address and informed him I would be mailing a packet of information and forms to be completed. I asked him to bring them with him the day of his appointment.   He voiced understanding of the above. I asked him to call me if he has any questions or concerns regarding his appointments or the forms he needs to complete.

## 2017-12-22 ENCOUNTER — Encounter (HOSPITAL_COMMUNITY)
Admission: RE | Admit: 2017-12-22 | Discharge: 2017-12-22 | Disposition: A | Discharge status: Home / Self Care | Payer: Medicare Other | Source: Ambulatory Visit | Attending: Urology | Admitting: Urology

## 2017-12-22 DIAGNOSIS — C61 Malignant neoplasm of prostate: Secondary | ICD-10-CM | POA: Diagnosis not present

## 2017-12-22 DIAGNOSIS — C7951 Secondary malignant neoplasm of bone: Secondary | ICD-10-CM | POA: Diagnosis not present

## 2017-12-22 MED ORDER — TECHNETIUM TC 99M MEDRONATE IV KIT
20.9000 | PACK | Freq: Once | INTRAVENOUS | Status: AC | PRN
  Administered 2017-12-22: 20.9 via INTRAVENOUS

## 2017-12-25 DIAGNOSIS — M546 Pain in thoracic spine: Secondary | ICD-10-CM | POA: Diagnosis not present

## 2017-12-28 ENCOUNTER — Emergency Department (HOSPITAL_COMMUNITY)
Admission: EM | Admit: 2017-12-28 | Discharge: 2017-12-28 | Disposition: A | Discharge status: Home / Self Care | Payer: Medicare Other | Attending: Emergency Medicine | Admitting: Emergency Medicine

## 2017-12-28 ENCOUNTER — Encounter (HOSPITAL_COMMUNITY): Payer: Self-pay | Admitting: Emergency Medicine

## 2017-12-28 ENCOUNTER — Other Ambulatory Visit: Payer: Self-pay

## 2017-12-28 ENCOUNTER — Telehealth: Payer: Self-pay | Admitting: Medical Oncology

## 2017-12-28 DIAGNOSIS — Z7902 Long term (current) use of antithrombotics/antiplatelets: Secondary | ICD-10-CM | POA: Diagnosis not present

## 2017-12-28 DIAGNOSIS — Z79899 Other long term (current) drug therapy: Secondary | ICD-10-CM | POA: Diagnosis not present

## 2017-12-28 DIAGNOSIS — M545 Low back pain: Secondary | ICD-10-CM | POA: Diagnosis present

## 2017-12-28 DIAGNOSIS — M5441 Lumbago with sciatica, right side: Secondary | ICD-10-CM

## 2017-12-28 DIAGNOSIS — Z7982 Long term (current) use of aspirin: Secondary | ICD-10-CM | POA: Insufficient documentation

## 2017-12-28 DIAGNOSIS — Z87891 Personal history of nicotine dependence: Secondary | ICD-10-CM | POA: Diagnosis not present

## 2017-12-28 MED ORDER — OXYCODONE-ACETAMINOPHEN 5-325 MG PO TABS: 1.0000 | ORAL_TABLET | Freq: Four times a day (QID) | ORAL | 0 refills | Status: DC | PRN

## 2017-12-28 NOTE — Progress Notes (Signed)
GU Location of Tumor / Histology: Adenocarcinoma of the Prostate  If Prostate Cancer, Gleason Score is (4 + 4), PSA is (48; 5/19=22.4, 2/19=14.2), and prostate volume ~134 grams  Biopsies (if applicable) revealed: 01/00/7121  MRI 07/23/2017 IMPRESSION: 1. Small PI-RADS category 4 lesion in the right posterior base peripheral zone, measuring 0.53 cubic cm. Targeting data for this lesion was sent to [place]. 2. Nodularity along the median lobe with early enhancement and some increase in B value, but appearing as a circumscribed heterogeneous in capsulated nodule on T2 weighted images and accordingly classified as PI-RADS category 2. 3. Prostatomegaly, prostate volume approximately 127 cubic cm. 4. Sigmoid colon diverticulosis.  Past/Anticipated interventions by urology, if any:  Prostate needle biopsy: -12/05/2017 -03/08/2017  Past/Anticipated interventions by medical oncology, if any: will meet with Dr. Alen Blew today in prostate clinic  Weight changes, if any: none  Bowel/Bladder complaints, if any: bladder frequency; incomplete emptying; stream mostly steady; nocturia Q2hours.  Nausea/Vomiting, if any: No  Pain issues, if any:  Pt c/o pain in sciatica, rated "4-6/10"  SAFETY ISSUES:  Prior radiation? No  Pacemaker/ICD? No  Possible current pregnancy? N/A  Is the patient on methotrexate? No  Current Complaints / other details:  Married with 2 sons. Retired. Former smoker; has not smoked since 01/17/2002. No family history of prostate cancer  Pt is accompanied by sister.   BP (!) 156/90 (BP Location: Left Arm, Patient Position: Sitting)   Pulse 88   Temp 98.6 F (37 C) (Oral)   Resp 18   Ht 5\' 10"  (1.778 m)   Wt 230 lb 4 oz (104.4 kg)   SpO2 97%   BMI 33.04 kg/m   Wt Readings from Last 3 Encounters:  12/29/17 230 lb 4 oz (104.4 kg)  12/28/17 225 lb (102.1 kg)  10/11/17 231 lb (104.8 kg)   Loma Sousa, RN BSN

## 2017-12-28 NOTE — Telephone Encounter (Signed)
Called appointment reminder for [facility] 12/29/17 arriving at 8 am. I reviewed location, registration and reminded him to bring his completed medical forms. He voiced understanding.

## 2017-12-28 NOTE — ED Provider Notes (Addendum)
Dennis Patel DEPT Provider Note: Georgena Spurling, MD, FACEP  CSN: [id number] MRN: [id number] ARRIVAL: 12/28/17 at [place]: [place]  12/28/17 3:51 AM Landis Gandy is a 67 y.o. male who has had low back pain for the past 2 weeks.  The back pain has been located on the right side.  His pain but has been steadily worsening.  He saw his PCP and was started on prednisone 3 days ago.  Despite this his pain became severe yesterday evening.  It is located across his lower back and down his right leg and about the L4 or L5 dermatome.  Pain is worse with movement of the right hip.  He denies associated numbness, weakness, bowel or bladder changes.  He has been taking Tylenol for the pain without adequate relief.   Past Medical History:  Diagnosis Date  . Atherosclerosis   . BPH (benign prostatic hyperplasia)   . Claudication ([place])   . Elevated PSA   . Family history of adverse reaction to anesthesia    ' My Brother had an allergic reaction "  . GERD (gastroesophageal reflux disease)   . History of kidney stones   . Peripheral artery disease ([place])   . Peripheral vascular disease ([place])   . Pure hypercholesterolemia   . 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 ABDOMINAL ANGIOGRAM N/A 03/17/2011   Procedure: ABDOMINAL ANGIOGRAM;  Surgeon: Jettie Booze, MD;  Location: [facility] CATH LAB;  Service: Cardiovascular;  Laterality: N/A;  . ABDOMINAL AORTOGRAM W/LOWER EXTREMITY N/A 03/17/2017   Procedure: ABDOMINAL AORTOGRAM W/LOWER EXTREMITY;  Surgeon: Angelia Mould, MD;  Location: [place] CV LAB;  Service: Cardiovascular;  Laterality: N/A;  . ABDOMINAL AORTOGRAM W/LOWER EXTREMITY N/A 09/08/2017   Procedure: ABDOMINAL AORTOGRAM W/LOWER EXTREMITY;  Surgeon: Angelia Mould, MD;  Location: [facility] CV LAB;  Service: Cardiovascular;  Laterality: N/A;  . ILIAC  ARTERY STENT    . LOWER EXTREMITY ANGIOGRAM N/A 03/17/2011   Procedure: LOWER EXTREMITY ANGIOGRAM;  Surgeon: Jettie Booze, MD;  Location: [facility] CATH LAB;  Service: Cardiovascular;  Laterality: N/A;  . PERIPHERAL VASCULAR INTERVENTION Left 09/08/2017   Procedure: PERIPHERAL VASCULAR INTERVENTION;  Surgeon: Angelia Mould, MD;  Location: [place] CV LAB;  Service: Cardiovascular;  Laterality: Left;  Common iliac    Family History  Problem Relation Age of Onset  . Cancer Mother   . Diabetes Mother   . CAD Father   . Diabetes Sister   . CAD Son   . Diabetes Son   . Heart disease Son     Social History   Tobacco Use  . Smoking status: Former Smoker    Types: Cigarettes    Last attempt to quit: 2005    Years since quitting: 14.9  . Smokeless tobacco: Never Used  Substance Use Topics  . Alcohol use: Never    Frequency: Never  . Drug use: Never    Prior to Admission medications   Medication Sig Start Date End Date Taking? Authorizing Provider  amLODipine (NORVASC) 10 MG tablet Take 1 tablet (10 mg total) by mouth daily. 07/18/17   Cristal Ford, DO  aspirin EC 81 MG tablet Take 81 mg by mouth daily.    [provider]  atorvastatin (LIPITOR) 10 MG tablet Take 1 tablet (10 mg total) by mouth daily. 09/08/17 09/08/18  Angelia Mould, MD  clopidogrel (PLAVIX) 75 MG tablet Take 1 tablet (75 mg total) by mouth daily. 09/08/17   Angelia Mould, MD  famotidine (PEPCID) 10 MG tablet Take 10 mg by mouth daily.    [provider]  finasteride (PROSCAR) 5 MG tablet Take 5 mg by mouth daily.    [provider]  hydrochlorothiazide (HYDRODIURIL) 12.5 MG tablet Take 12.5 mg by mouth daily. 09/01/17   [provider]  ibuprofen (ADVIL,MOTRIN) 200 MG tablet Take 200 mg by mouth every 6 (six) hours as needed for headache or moderate pain.     [provider]  Multiple Vitamins-Minerals (ONE DAILY MULTIVITAMIN MEN PO) Take 1  tablet by mouth daily.    [provider]    Allergies Patient has no known allergies.   REVIEW OF SYSTEMS  Negative except as noted here or in the History of Present Illness.   PHYSICAL EXAMINATION  Initial Vital Signs Blood pressure (!) 167/90, pulse 84, temperature 97.6 F (36.4 C), temperature source Oral, resp. rate 18, height 5\' 10"  (1.778 m), weight 102.1 kg, SpO2 96 %.  Examination General: Well-developed, well-nourished male in no acute distress; appearance consistent with age of record HENT: normocephalic; atraumatic Eyes: pupils equal, round and reactive to light; extraocular muscles intact Neck: supple Heart: regular rate and rhythm Lungs: clear to auscultation bilaterally Abdomen: soft; nondistended; nontender; bowel sounds present Back: Nontender; pain on movement of right hip Extremities: No deformity; full range of motion; pulses normal Neurologic: Awake, alert and oriented; motor function intact in all extremities and symmetric; sensation intact and symmetric in lower extremities; no facial droop Skin: Warm and dry Psychiatric: Normal mood and affect   RESULTS  Summary of this visit's results, reviewed by myself:   EKG Interpretation  Date/Time:    Ventricular Rate:    PR Interval:    QRS Duration:   QT Interval:    QTC Calculation:   R Axis:     Text Interpretation:        Laboratory Studies: No results found for this or any previous visit (from the past 24 hour(s)). Imaging Studies: No results found.  ED COURSE and MDM  Nursing notes and initial vitals signs, including pulse oximetry, reviewed.  Vitals:   12/28/17 0025 12/28/17 0027  BP: (!) 167/90   Pulse: 84   Resp: 18   Temp: 97.6 F (36.4 C)   TempSrc: Oral   SpO2: 96%   Weight:  102.1 kg  Height:  5\' 10"  (1.778 m)   We will start patient on Norco and he will contact his PCP later this morning.  Consultation with the [facility] state controlled substances  database reveals the patient has received no opioid analgesic prescriptions in the past 2 years.   PROCEDURES    ED DIAGNOSES     ICD-10-CM   1. Acute bilateral low back pain with right-sided sciatica M54.41        Giulio Bertino, Jenny Reichmann, MD 12/28/17 0402    Shanon Rosser, MD 12/28/17 0165

## 2017-12-29 ENCOUNTER — Telehealth: Payer: Self-pay | Admitting: Oncology

## 2017-12-29 ENCOUNTER — Encounter: Payer: Self-pay | Admitting: Medical Oncology

## 2017-12-29 ENCOUNTER — Other Ambulatory Visit: Payer: Self-pay

## 2017-12-29 ENCOUNTER — Inpatient Hospital Stay: Payer: Medicare Other | Attending: Oncology | Admitting: Oncology

## 2017-12-29 ENCOUNTER — Ambulatory Visit
Admission: RE | Admit: 2017-12-29 | Discharge: 2017-12-29 | Disposition: A | Discharge status: Home / Self Care | Payer: Medicare Other | Source: Ambulatory Visit | Attending: Radiation Oncology | Admitting: Radiation Oncology

## 2017-12-29 ENCOUNTER — Encounter: Payer: Self-pay | Admitting: Radiation Oncology

## 2017-12-29 DIAGNOSIS — M549 Dorsalgia, unspecified: Secondary | ICD-10-CM | POA: Insufficient documentation

## 2017-12-29 DIAGNOSIS — R972 Elevated prostate specific antigen [PSA]: Secondary | ICD-10-CM | POA: Diagnosis not present

## 2017-12-29 DIAGNOSIS — Z191 Hormone sensitive malignancy status: Secondary | ICD-10-CM | POA: Insufficient documentation

## 2017-12-29 DIAGNOSIS — C7951 Secondary malignant neoplasm of bone: Secondary | ICD-10-CM | POA: Insufficient documentation

## 2017-12-29 DIAGNOSIS — Z79899 Other long term (current) drug therapy: Secondary | ICD-10-CM | POA: Diagnosis not present

## 2017-12-29 DIAGNOSIS — I739 Peripheral vascular disease, unspecified: Secondary | ICD-10-CM | POA: Diagnosis not present

## 2017-12-29 DIAGNOSIS — G893 Neoplasm related pain (acute) (chronic): Secondary | ICD-10-CM | POA: Diagnosis not present

## 2017-12-29 DIAGNOSIS — K219 Gastro-esophageal reflux disease without esophagitis: Secondary | ICD-10-CM | POA: Diagnosis not present

## 2017-12-29 DIAGNOSIS — Z87891 Personal history of nicotine dependence: Secondary | ICD-10-CM | POA: Diagnosis not present

## 2017-12-29 DIAGNOSIS — Z87442 Personal history of urinary calculi: Secondary | ICD-10-CM | POA: Diagnosis not present

## 2017-12-29 DIAGNOSIS — E7800 Pure hypercholesterolemia, unspecified: Secondary | ICD-10-CM | POA: Diagnosis not present

## 2017-12-29 DIAGNOSIS — C61 Malignant neoplasm of prostate: Secondary | ICD-10-CM | POA: Diagnosis not present

## 2017-12-29 DIAGNOSIS — I2510 Atherosclerotic heart disease of native coronary artery without angina pectoris: Secondary | ICD-10-CM | POA: Insufficient documentation

## 2017-12-29 DIAGNOSIS — Z7982 Long term (current) use of aspirin: Secondary | ICD-10-CM | POA: Diagnosis not present

## 2017-12-29 MED ORDER — OXYCODONE-ACETAMINOPHEN 5-325 MG PO TABS: 1.0000 | ORAL_TABLET | Freq: Four times a day (QID) | ORAL | 0 refills | Status: DC | PRN

## 2017-12-29 NOTE — Progress Notes (Signed)
Reason for the request:   Prostate cancer  HPI: I was asked by Dr. Junious Silk  to evaluate Dennis Patel for diagnosis of prostate cancer.  He is a 67 year old gentleman currently of [place] where he currently living but lived in multiple other locations.  He has history of peripheral vascular disease and found to have an elevated PSA that has been rising since 2018.  His PSA in 2018 was 9.7 and most recently in November 2019 was up to 48.  He was evaluated by Dr. Junious Silk and a prostate biopsy obtained on December 05, 2017 showed a Gleason score of 3+3 equal 6 on the right side but an MRI fusion biopsy on 1 lesion showed a Gleason score of 4+4 = 8 high risk prostate cancer.  Imaging studies including CT scan of the abdomen and pelvis on 12/22/2017 showed widespread sclerotic mixed with lytic lesions throughout the visualized skeleton and nondisplaced pathological fracture in T10.  No pelvic adenopathy noted at this time.  Bone scan obtained on 12/23/2017 confirmed the presence of osseous metastatic disease involving the thoracolumbar spine, sternum left anterior ribs and mid humerus regions.  Based on these findings he was referred to the prostate cancer multidisciplinary clinic.  Clinically, he reports increased back pain and was evaluated by his primary care physician and was prescribed prednisone and subsequently required an emergency department visit on 12/28/2017 and was prescribed Percocet which helped his pain.  His pain is manageable at this time is able to ambulate without any difficulties.  He denies any falls or neurological deficits.  He is able to perform activities of daily living without any decline.  He does not report any headaches, blurry vision, syncope or seizures. Does not report any fevers, chills or sweats.  Does not report any cough, wheezing or hemoptysis.  Does not report any chest pain, palpitation, orthopnea or leg edema.  Does not report any nausea, vomiting or abdominal pain.   Does not report any constipation or diarrhea.  Does not report any skeletal complaints.    Does not report frequency, urgency or hematuria.  Does not report any skin rashes or lesions. Does not report any heat or cold intolerance.  Does not report any lymphadenopathy or petechiae.  Does not report any anxiety or depression.  Remaining review of systems is negative.    Past Medical History:  Diagnosis Date  . Atherosclerosis   . BPH (benign prostatic hyperplasia)   . Claudication (Dennis Patel)   . Elevated PSA   . Family history of adverse reaction to anesthesia    ' My Brother had an allergic reaction "  . GERD (gastroesophageal reflux disease)   . History of kidney stones   . Peripheral artery disease ([place])   . Peripheral vascular disease ([place])   . Pure hypercholesterolemia   . 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 ABDOMINAL ANGIOGRAM N/A 03/17/2011   Procedure: ABDOMINAL ANGIOGRAM;  Surgeon: Jettie Booze, MD;  Location: [facility] CATH LAB;  Service: Cardiovascular;  Laterality: N/A;  . ABDOMINAL AORTOGRAM W/LOWER EXTREMITY N/A 03/17/2017   Procedure: ABDOMINAL AORTOGRAM W/LOWER EXTREMITY;  Surgeon: Angelia Mould, MD;  Location: [place] CV LAB;  Service: Cardiovascular;  Laterality: N/A;  . ABDOMINAL AORTOGRAM W/LOWER EXTREMITY N/A 09/08/2017   Procedure: ABDOMINAL AORTOGRAM W/LOWER EXTREMITY;  Surgeon: Angelia Mould, MD;  Location: [place] CV LAB;  Service: Cardiovascular;  Laterality: N/A;  . ILIAC ARTERY STENT    .  LOWER EXTREMITY ANGIOGRAM N/A 03/17/2011   Procedure: LOWER EXTREMITY ANGIOGRAM;  Surgeon: Jettie Booze, MD;  Location: [facility] CATH LAB;  Service: Cardiovascular;  Laterality: N/A;  . PERIPHERAL VASCULAR INTERVENTION Left 09/08/2017   Procedure: PERIPHERAL VASCULAR INTERVENTION;  Surgeon: Angelia Mould, MD;  Location: [place] CV LAB;  Service: Cardiovascular;  Laterality: Left;  Common  iliac  :   Current Outpatient Medications:  .  amLODipine (NORVASC) 10 MG tablet, Take 1 tablet (10 mg total) by mouth daily., Disp: 30 tablet, Rfl: 0 .  aspirin EC 81 MG tablet, Take 81 mg by mouth daily., Disp: , Rfl:  .  atorvastatin (LIPITOR) 10 MG tablet, Take 1 tablet (10 mg total) by mouth daily., Disp: 30 tablet, Rfl: 11 .  clopidogrel (PLAVIX) 75 MG tablet, Take 1 tablet (75 mg total) by mouth daily., Disp: 30 tablet, Rfl: 11 .  famotidine (PEPCID) 10 MG tablet, Take 10 mg by mouth daily., Disp: , Rfl:  .  finasteride (PROSCAR) 5 MG tablet, Take 5 mg by mouth daily., Disp: , Rfl:  .  hydrochlorothiazide (HYDRODIURIL) 12.5 MG tablet, Take 12.5 mg by mouth daily., Disp: , Rfl: 12 .  ibuprofen (ADVIL,MOTRIN) 200 MG tablet, Take 200 mg by mouth every 6 (six) hours as needed for headache or moderate pain. , Disp: , Rfl:  .  Multiple Vitamins-Minerals (ONE DAILY MULTIVITAMIN MEN PO), Take 1 tablet by mouth daily., Disp: , Rfl:  .  oxyCODONE-acetaminophen (PERCOCET) 5-325 MG tablet, Take 1 tablet by mouth every 6 (six) hours as needed for severe pain (may cause constipation)., Disp: 20 tablet, Rfl: 0 .  predniSONE (DELTASONE) 20 MG tablet, , Disp: , Rfl: 0:  No Known Allergies:  Family History  Problem Relation Age of Onset  . Cancer Mother   . Diabetes Mother   . CAD Father   . Diabetes Sister   . CAD Son   . Diabetes Son   . Heart disease Son   :  Social History   Socioeconomic History  . Marital status: Married    Spouse name: Not on file  . Number of children: Not on file  . Years of education: Not on file  . Highest education level: Not on file  Occupational History  . Not on file  Social Needs  . Financial resource strain: Not on file  . Food insecurity:    Worry: Not on file    Inability: Not on file  . Transportation needs:    Medical: Not on file    Non-medical: Not on file  Tobacco Use  . Smoking status: Former Smoker    Types: Cigarettes    Last attempt  to quit: 2005    Years since quitting: 14.9  . Smokeless tobacco: Never Used  Substance and Sexual Activity  . Alcohol use: Never    Frequency: Never  . Drug use: Never  . Sexual activity: Not on file  Lifestyle  . Physical activity:    Days per week: Not on file    Minutes per session: Not on file  . Stress: Not on file  Relationships  . Social connections:    Talks on phone: Not on file    Gets together: Not on file    Attends religious service: Not on file    Active member of club or organization: Not on file    Attends meetings of clubs or organizations: Not on file    Relationship status: Not on file  . Intimate  partner violence:    Fear of current or ex partner: Not on file    Emotionally abused: Not on file    Physically abused: Not on file    Forced sexual activity: Not on file  Other Topics Concern  . Not on file  Social History Narrative   ** Merged History Encounter **      :  Pertinent items are noted in HPI.  Exam: ECOG 0 General appearance: alert and cooperative appeared without distress. Head: atraumatic without any abnormalities. Eyes: conjunctivae/corneas clear. PERRL.  Sclera anicteric. Throat: lips, mucosa, and tongue normal; without oral thrush or ulcers. Resp: clear to auscultation bilaterally without rhonchi, wheezes or dullness to percussion. Cardio: regular rate and rhythm, S1, S2 normal, no murmur, click, rub or gallop GI: soft, non-tender; bowel sounds normal; no masses,  no organomegaly Skin: Skin color, texture, turgor normal. No rashes or lesions Lymph nodes: Cervical, supraclavicular, and axillary nodes normal. Neurologic: Grossly normal without any motor, sensory or deep tendon reflexes. Musculoskeletal: No joint deformity or effusion.  CBC    Component Value Date/Time   WBC 9.8 09/09/2017 0256   RBC 4.55 09/09/2017 0256   HGB 13.7 09/09/2017 0256   HCT 40.4 09/09/2017 0256   PLT 205 09/09/2017 0256   MCV 88.8 09/09/2017 0256    MCH 30.1 09/09/2017 0256   MCHC 33.9 09/09/2017 0256   RDW 12.4 09/09/2017 0256   LYMPHSABS 1.8 09/09/2017 0256   MONOABS 1.2 (H) 09/09/2017 0256   EOSABS 0.1 09/09/2017 0256   BASOSABS 0.0 09/09/2017 0256     Chemistry      Component Value Date/Time   NA 139 09/08/2017 0828   K 4.6 09/08/2017 0828   CL 103 09/08/2017 0828   CO2 29 07/12/2017 0324   BUN 22 09/08/2017 0828   CREATININE 1.10 09/08/2017 0828      Component Value Date/Time   CALCIUM 8.9 07/12/2017 0324   ALKPHOS 90 07/12/2017 0324   AST 22 07/12/2017 0324   ALT 28 07/12/2017 0324   BILITOT 0.6 07/12/2017 0324        Nm Bone Scan Whole Body  Result Date: 12/23/2017 CLINICAL DATA:  Prostate cancer. EXAM: NUCLEAR MEDICINE WHOLE BODY BONE SCAN TECHNIQUE: Whole body anterior and posterior images were obtained approximately 3 hours after intravenous injection of radiopharmaceutical. RADIOPHARMACEUTICALS:  20.9 mCi Technetium-35m MDP IV COMPARISON:  CT abdomen pelvis from same day. FINDINGS: There are multiple foci of increased radiotracer uptake involving the thoracolumbar spine, sternum, left anterior ribs, right mid humerus and femur, right ischium, and frontal skull. Normal physiologic activity is identified within the kidneys and urinary bladder. IMPRESSION: 1. Osseous metastatic disease as above. Electronically Signed   By: Titus Dubin M.D.   On: 12/23/2017 12:21    Assessment and Plan:   67 year old man with the following:  1.  Castration-sensitive advanced prostate cancer with metastatic disease to the bone documented in November 2019.  His Gleason score is 4+4 = 8 with widespread bone disease.  The natural course of this disease and treatment options were discussed today in detail.  He understands he has an incurable malignancy but disease that can be palliated effectively.  Androgen deprivation therapy remains the gold standard at this time and additional therapy may be warranted given his widespread  disease and reasonable performance status.  These options would include systemic chemotherapy in the form of extent here versus oral targeted therapy in the form of Zytiga or Xtandi.  Risks and benefits of  both approaches were reviewed.  Clinical benefit has been described with all these agents including overall survival and progression free survival.  Written information was given to the patient today and make his decision in the near future.  2.  Androgen deprivation therapy: I recommended starting this as soon as possible to be continued indefinitely under the care of Dr. Junious Silk.  3.  Bone directed therapy: He will be a good candidate for Xgeva in the future after obtaining dental clearance.  I recommended starting calcium and vitamin D supplements.  4.  Bone pain: Manageable with Percocet which I will refill for him today and will assess his pain in the near future.  If his pain persists, palliative radiation therapy may be an option.  5.  Follow-up: We will be in the next 3 weeks to follow his progress and determine the next treatment beyond androgen deprivation.  60  minutes was spent with the patient face-to-face today.  More than 50% of time was dedicated to reviewing his disease status, imaging studies and discussing future plan of care.   Thank you for the referral.  A copy of this consult has been forwarded to the requesting physician.

## 2017-12-29 NOTE — Telephone Encounter (Signed)
Scheduled appt per 12/13 sch message - unable to reach patient - vmail full - sent reminder letter in the mail with appt date and time

## 2017-12-29 NOTE — Progress Notes (Signed)
                               Care Plan Summary  Name: Mr. Tyshan Enderle DOB: [birth year]   Your Medical Team:   Urologist -  Dr. Raynelle Bring, [facility]  Radiation Oncologist - Dr. Tyler Pita, [facility]   Medical Oncologist - Dr. Zola Button, [place]  Recommendations: 1) Androgen Deprivation (hormone injection)  2) Zytiga or Taxotere   * These recommendations are based on information available as of today's consult.      Recommendations may change depending on the results of further tests or exams.  Next Steps: 1) Dr. Lyndal Rainbow office will schedule hormone injection  2) Dr. Alen Blew will schedule follow appointment   When appointments need to be scheduled, you will be contacted by [facility] and/or [facility].  Questions?  Please do not hesitate to call Cira Rue, RN, BSN, OCN at [phone] any questions or concerns.  Shirlean Mylar is your Oncology Nurse Navigator and is available to assist you while you're receiving your medical care at [facility].

## 2017-12-29 NOTE — Progress Notes (Signed)
Radiation Oncology         [phone] ________________________________  Multidisciplinary Prostate Cancer Clinic  Initial Radiation Oncology Consultation  Name: Dennis Patel MRN: [id number]  Date: 12/29/2017  DOB: [birth year]  [id number], [name], MD  [name], [name], MD   REFERRING PHYSICIAN: [name], [name], MD  DIAGNOSIS: 67 y.o. gentleman with stage T1c adenocarcinoma of the prostate with a Gleason's score of 4+4 and a PSA of 48.3.    ICD-10-CM   1. Malignant neoplasm of prostate ([place]) C61 Ambulatory referral to Social Work    Dennis Patel is a 67 y.o. gentleman.  He is an established patient of Dr. Junious Silk who was noted to have an elevated and steadily-rising PSA. On 03/08/17, he underwent prostate biopsy, with PSA at the time of the procedure being 14.2. Final pathology was negative for malignancy.  He was started on a 5-alpha reductase inhibitor at that time.  He continued in follow up and his PSA rose to 22.4 on 06/02/17. He underwent prostate MRI on 07/24/17 which showed a 127 g prostate and a small 5 mm PI-RADS 4 lesion at the right base. He required surgical management of his PVD in the interim, delaying repeat biopsy of the prostate.  On follow up, the PSA was found to be further elevated at 31.7 on 08/10/17, despite 5-ARI. The patient underwent an MR fusion biopsy on 12/05/17.  The prostate volume measured 134 cc.  Out of 12 core biopsies plus 4 cores from the lesion, 3 were positive with a nearby core with atypical cells.  The maximum Gleason score was 4+4, and this was seen in one core of the ROI lesion.  Additionally, Gleason 3+3 was noted in the right mid lateral gland and one core of the ROI lesion. He underwent CT A/P and bone scan on 12/22/17 showing widespread osseous metastatic disease.  The patient reviewed the biopsy results with his urologist and he has kindly been referred today to the multidisciplinary prostate cancer clinic for  presentation of pathology and radiology studies in our conference for discussion of potential radiation treatment options and clinical evaluation.   Of note, he presented to the ED yesterday, 12/28/17, with severe, intractable mid-low back pain which correlates with recent bone scan findings.   PREVIOUS RADIATION THERAPY: No  PAST MEDICAL HISTORY:  has a past medical history of Atherosclerosis, BPH (benign prostatic hyperplasia), Claudication ([place]), Elevated PSA, Family history of adverse reaction to anesthesia, GERD (gastroesophageal reflux disease), History of kidney stones, Peripheral artery disease ([place]), Peripheral vascular disease ([place]), Pure hypercholesterolemia, and Stress reaction.    PAST SURGICAL HISTORY: Past Surgical History:  Procedure Laterality Date  . ABDOMINAL ANGIOGRAM N/A 03/17/2011   Procedure: ABDOMINAL ANGIOGRAM;  Surgeon: Jettie Booze, MD;  Location: [facility] CATH LAB;  Service: Cardiovascular;  Laterality: N/A;  . ABDOMINAL AORTOGRAM W/LOWER EXTREMITY N/A 03/17/2017   Procedure: ABDOMINAL AORTOGRAM W/LOWER EXTREMITY;  Surgeon: Angelia Mould, MD;  Location: [place] CV LAB;  Service: Cardiovascular;  Laterality: N/A;  . ABDOMINAL AORTOGRAM W/LOWER EXTREMITY N/A 09/08/2017   Procedure: ABDOMINAL AORTOGRAM W/LOWER EXTREMITY;  Surgeon: Angelia Mould, MD;  Location: [place] CV LAB;  Service: Cardiovascular;  Laterality: N/A;  . ILIAC ARTERY STENT    . LOWER EXTREMITY ANGIOGRAM N/A 03/17/2011   Procedure: LOWER EXTREMITY ANGIOGRAM;  Surgeon: Jettie Booze, MD;  Location[facility] CATH LAB;  Service: Cardiovascular;  Laterality: N/A;  . PERIPHERAL VASCULAR INTERVENTION Left 09/08/2017   Procedure: PERIPHERAL VASCULAR INTERVENTION;  Surgeon: Angelia Mould, MD;  Location: [place] CV LAB;  Service: Cardiovascular;  Laterality: Left;  Common iliac    FAMILY HISTORY: family history includes CAD in his father and son; Cancer in his mother; Diabetes  in his mother, sister, and son; Heart disease in his son.  SOCIAL HISTORY:  reports that he quit smoking about 14 years ago. His smoking use included cigarettes. He has never used smokeless tobacco. He reports that he does not drink alcohol or use drugs.  ALLERGIES: Patient has no known allergies.  MEDICATIONS:  Current Outpatient Medications  Medication Sig Dispense Refill  . amLODipine (NORVASC) 10 MG tablet Take 1 tablet (10 mg total) by mouth daily. 30 tablet 0  . aspirin EC 81 MG tablet Take 81 mg by mouth daily.    Marland Kitchen atorvastatin (LIPITOR) 10 MG tablet Take 1 tablet (10 mg total) by mouth daily. 30 tablet 11  . clopidogrel (PLAVIX) 75 MG tablet Take 1 tablet (75 mg total) by mouth daily. 30 tablet 11  . famotidine (PEPCID) 10 MG tablet Take 10 mg by mouth daily.    . finasteride (PROSCAR) 5 MG tablet Take 5 mg by mouth daily.    . hydrochlorothiazide (HYDRODIURIL) 12.5 MG tablet Take 12.5 mg by mouth daily.  12  . ibuprofen (ADVIL,MOTRIN) 200 MG tablet Take 200 mg by mouth every 6 (six) hours as needed for headache or moderate pain.     . Multiple Vitamins-Minerals (ONE DAILY MULTIVITAMIN MEN PO) Take 1 tablet by mouth daily.    . predniSONE (DELTASONE) 20 MG tablet   0  . oxyCODONE-acetaminophen (PERCOCET) 5-325 MG tablet Take 1 tablet by mouth every 6 (six) hours as needed for severe pain (may cause constipation). 60 tablet 0   No current facility-administered medications for this encounter.     REVIEW OF SYSTEMS:  On review of systems, the patient reports that he is doing well overall and his pain is currently well controlled with use of narcotic pain medications, alternating with Tylenol ES.  He is able to ambulate without difficulty and denies any recent falls or focal weakness in the lower extremities.  He denies any chest pain, shortness of breath, cough, fevers, chills, night sweats or unintended weight changes. He denies any bowel or bladder incontinence, and denies abdominal  pain, nausea or vomiting. He has also noted occasional pain in the right humerus intermittently but reports that this remains tolerable.  He remains able to perform his activities of daily living independently.  A complete review of systems is obtained and is otherwise negative.   PHYSICAL EXAM:  Wt Readings from Last 3 Encounters:  12/29/17 230 lb 4 oz (104.4 kg)  12/28/17 225 lb (102.1 kg)  10/11/17 231 lb (104.8 kg)   Temp Readings from Last 3 Encounters:  12/29/17 98.6 F (37 C) (Oral)  12/28/17 97.6 F (36.4 C) (Oral)  10/11/17 (!) 97.4 F (36.3 C) (Oral)   BP Readings from Last 3 Encounters:  12/29/17 (!) 156/90  12/28/17 (!) 164/72  10/11/17 (!) 146/86   Pulse Readings from Last 3 Encounters:  12/29/17 88  12/28/17 80  10/11/17 64   Pain Assessment Pain Score: 6  Pain Loc: Buttocks/10  In general this is a well appearing Caucasian male in no acute distress. He is alert and oriented x4 and appropriate throughout the examination. HEENT reveals that the patient is normocephalic, atraumatic. EOMs are intact. PERRLA. Skin is intact without any evidence of gross lesions. Cardiovascular exam reveals  a regular rate and rhythm, no clicks rubs or murmurs are auscultated. Chest is clear to auscultation bilaterally. Lymphatic assessment is performed and does not reveal any adenopathy in the cervical, supraclavicular, axillary, or inguinal chains. Abdomen has active bowel sounds in all quadrants and is intact. The abdomen is soft, non tender, non distended. Lower extremities are negative for pretibial pitting edema, deep calf tenderness, cyanosis or clubbing.  Sensation is intact to light touch and equal bilaterally in the lower extremities.  Strength is 5 out of 5 and equal bilaterally in the lower extremities.  He appears grossly neurologically intact.  KPS = 80  100 - Normal; no complaints; no evidence of disease. 90   - Able to carry on normal activity; minor signs or symptoms of  disease. 80   - Normal activity with effort; some signs or symptoms of disease. 90   - Cares for self; unable to carry on normal activity or to do active work. 60   - Requires occasional assistance, but is able to care for most of his personal needs. 50   - Requires considerable assistance and frequent medical care. 52   - Disabled; requires special care and assistance. 37   - Severely disabled; hospital admission is indicated although death not imminent. 34   - Very sick; hospital admission necessary; active supportive treatment necessary. 10   - Moribund; fatal processes progressing rapidly. 0     - Dead  Karnofsky DA, Abelmann [place], Craver LS and Burchenal [facility] [phone]) The use of the nitrogen mustards in the palliative treatment of carcinoma: with particular reference to bronchogenic carcinoma Cancer 1 [id number]   LABORATORY DATA:  Lab Results  Component Value Date   WBC 9.8 09/09/2017   HGB 13.7 09/09/2017   HCT 40.4 09/09/2017   MCV 88.8 09/09/2017   PLT 205 09/09/2017   Lab Results  Component Value Date   NA 139 09/08/2017   K 4.6 09/08/2017   CL 103 09/08/2017   CO2 29 07/12/2017   Lab Results  Component Value Date   ALT 28 07/12/2017   AST 22 07/12/2017   ALKPHOS 90 07/12/2017   BILITOT 0.6 07/12/2017     RADIOGRAPHY: Nm Bone Scan Whole Body  Result Date: 12/23/2017 CLINICAL DATA:  Prostate cancer. EXAM: NUCLEAR MEDICINE WHOLE BODY BONE SCAN TECHNIQUE: Whole body anterior and posterior images were obtained approximately 3 hours after intravenous injection of radiopharmaceutical. RADIOPHARMACEUTICALS:  20.9 mCi Technetium-81m MDP IV COMPARISON:  CT abdomen pelvis from same day. FINDINGS: There are multiple foci of increased radiotracer uptake involving the thoracolumbar spine, sternum, left anterior ribs, right mid humerus and femur, right ischium, and frontal skull. Normal physiologic activity is identified within the kidneys and urinary bladder. IMPRESSION: 1. Osseous  metastatic disease as above. Electronically Signed   By: Titus Dubin M.D.   On: 12/23/2017 12:21      IMPRESSION/PLAN: 67 y.o. gentleman with metastatic adenocarcinoma of the prostate, widespread throughout the skeleton, with a PSA of 48 and a Gleason score of 4+4.   Today, we talked to the patient and his wife about the findings and workup thus far. We discussed the natural history of metastatic prostate cancer and general treatment, highlighting the role of palliative radiotherapy in the management of painful osseous metastatic disease that is refractory to narcotic pain medication or conventional medical management.  At this time, he reports that his pain is fairly well-controlled with minimal use of narcotic pain medications.  He is able to maintain  a relatively normal activity level and is therefore satisfied with his current pain control.  We discussed the available radiation techniques that would be offered should he have progression of pain or lack of response to proposed medical therapy with androgen deprivation therapy. We focused on the details of logistics and delivery with regards to a 2-week course of daily radiotherapy. We reviewed the anticipated acute and late sequelae associated with radiation in this setting. We also detailed the role of ADT in the treatment of high risk, metastatic prostate cancer and outlined the associated side effects that could be expected with this therapy. The patient was encouraged to ask questions that were answered to his satisfaction.  At the end of the conversation the patient is interested in moving forward with LT-ADT. He will be referred back to Dr. Junious Silk to begin ADT in the near future and we would be more than happy to continue to participate in his care if clinically indicated in the future.     Nicholos Johns, PA-C    Tyler Pita, MD  [facility] Direct Dial: [phone]  Fax: [phone] [email or web address]   Skype  [email or web address]   This document serves as a record of services personally performed by Tyler Pita, MD and Freeman Caldron, PA-C. It was created on their behalf by Wilburn Mylar, a trained medical scribe. The creation of this record is based on the scribe's personal observations and the provider's statements to them. This document has been checked and approved by the attending provider.

## 2018-01-02 ENCOUNTER — Telehealth: Payer: Self-pay | Admitting: Medical Oncology

## 2018-01-02 ENCOUNTER — Telehealth: Payer: Self-pay

## 2018-01-02 NOTE — Telephone Encounter (Signed)
Patient called stating he has not been contacted by Dr. Lyndal Rainbow office about his Lupron injection. I informed him that his office will have to get insurance approval before the appointment will be made. He was sent in the [facility] 12/29/17. I faxed PMDC note to [facility] and I sent in basket message to Dr. Junious Silk about Lupron.

## 2018-01-02 NOTE — Telephone Encounter (Signed)
Per 12/13 no los °

## 2018-01-03 ENCOUNTER — Encounter: Payer: Self-pay | Admitting: General Practice

## 2018-01-03 NOTE — Progress Notes (Signed)
[facility] Psychosocial Distress Screening Clinical Social Work  Clinical Social Work was referred by distress screening protocol.  The patient scored a 6 on the Psychosocial Distress Thermometer which indicates moderate distress. Clinical Social Worker contacted patient by phone to assess for distress and other psychosocial needs. Has worked to manage pain relief and find appropriate and effective medication regimen.  Wants to get appointments scheduled for treatment - has not gotten this information yet but is in contact w Nurse Navigator.  Wife has dementia, lives in [place] but broke her leg recently so is currently inpatient at [facility] and will move to [facility] for rehab.  Difficulty for patient is having to rely on friends and family to drive him to visit as he does not feel comfortable driving long distances while on pain medications.  CSW and patient discussed common feeling and emotions when being diagnosed with cancer, and the importance of support during treatment. CSW informed patient of the support team and support services at [facility]. CSW provided contact information and encouraged patient to call with any questions or concerns.  ONCBCN DISTRESS SCREENING 12/29/2017  Screening Type Initial Screening  Distress experienced in past week (1-10) 6  Family Problem type Other (comment)  Emotional problem type Nervousness/Anxiety;Adjusting to illness  Information Concerns Type Lack of info about diagnosis;Lack of info about treatment;Lack of info about complementary therapy choices;Lack of info about maintaining fitness    Clinical Social Worker follow up needed: No.  If yes, follow up plan:  Beverely Pace, [place], LCSW Clinical Social Worker Phone:  [phone]

## 2018-01-05 ENCOUNTER — Encounter: Payer: Self-pay | Admitting: Medical Oncology

## 2018-01-05 DIAGNOSIS — C61 Malignant neoplasm of prostate: Secondary | ICD-10-CM | POA: Diagnosis not present

## 2018-01-05 DIAGNOSIS — Z5111 Encounter for antineoplastic chemotherapy: Secondary | ICD-10-CM | POA: Diagnosis not present

## 2018-01-05 NOTE — Progress Notes (Signed)
Spoke with Dennis Patel as follow up to [name]. He did receive a call from Dr. Lyndal Rainbow office for appointment to get ADT today. He has follow up with Dr. Alen Blew 1/6 to discuss chemotherapy. He has decided he wants to do Taxotere and not Zytiga. I asked him to call with questions or concerns. Will continue to follow.

## 2018-01-22 ENCOUNTER — Encounter: Payer: Self-pay | Admitting: Medical Oncology

## 2018-01-22 ENCOUNTER — Inpatient Hospital Stay: Payer: Medicare Other | Attending: Oncology | Admitting: Oncology

## 2018-01-22 VITALS — BP 156/88 | HR 75 | Temp 98.7°F | Resp 18 | Ht 70.0 in | Wt 229.1 lb

## 2018-01-22 DIAGNOSIS — C7951 Secondary malignant neoplasm of bone: Secondary | ICD-10-CM

## 2018-01-22 DIAGNOSIS — Z7689 Persons encountering health services in other specified circumstances: Secondary | ICD-10-CM | POA: Diagnosis not present

## 2018-01-22 DIAGNOSIS — Z79899 Other long term (current) drug therapy: Secondary | ICD-10-CM

## 2018-01-22 DIAGNOSIS — G893 Neoplasm related pain (acute) (chronic): Secondary | ICD-10-CM | POA: Diagnosis not present

## 2018-01-22 DIAGNOSIS — Z7982 Long term (current) use of aspirin: Secondary | ICD-10-CM

## 2018-01-22 DIAGNOSIS — C61 Malignant neoplasm of prostate: Secondary | ICD-10-CM | POA: Diagnosis not present

## 2018-01-22 DIAGNOSIS — Z5111 Encounter for antineoplastic chemotherapy: Secondary | ICD-10-CM | POA: Diagnosis not present

## 2018-01-22 DIAGNOSIS — Z191 Hormone sensitive malignancy status: Secondary | ICD-10-CM | POA: Diagnosis not present

## 2018-01-22 DIAGNOSIS — Z7189 Other specified counseling: Secondary | ICD-10-CM

## 2018-01-22 MED ORDER — PROCHLORPERAZINE MALEATE 10 MG PO TABS: 10.0000 mg | ORAL_TABLET | Freq: Four times a day (QID) | ORAL | 0 refills | Status: DC | PRN

## 2018-01-22 MED ORDER — LIDOCAINE-PRILOCAINE 2.5-2.5 % EX CREA: 1.0000 "application " | TOPICAL_CREAM | CUTANEOUS | 0 refills | Status: DC | PRN

## 2018-01-22 NOTE — Progress Notes (Signed)
Dennis Patel here today to discuss Taxotere. He is very comfortable with his decision and ready to move forward.He will be scheduled for chemo education, port placement and Taxotere. He is encouraged to call with questions or concerns.

## 2018-01-22 NOTE — Progress Notes (Signed)
START ON PATHWAY REGIMEN - Prostate     A cycle is every 21 days:     Docetaxel   **Always confirm dose/schedule in your pharmacy ordering system**  Patient Characteristics: Adenocarcinoma, Distant Metastases, Hormone Naive Histology: Adenocarcinoma Therapeutic Status: Distant Metastases  Intent of Therapy: Non-Curative / Palliative Intent, Discussed with Patient 

## 2018-01-22 NOTE — Progress Notes (Signed)
Hematology and Oncology Follow Up Visit  Dennis Patel [id number] [birth year] 68 y.o. 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  Gleason score is 4+4 = 8 with a PSA of 48.   Prior Therapy:  Prostate biopsy obtained on December 05, 2017 showed a Gleason score of 4+4 = 8.  Current therapy:  Androgen deprivation therapy under the care of Dr. Junious Silk in the form of Mills Koller started in December 2019.  Interim History: Mr. Dennis Patel presents today for a follow-up visit.  Since last visit, he reports no major changes in his health.  He reports his bone pain has improved dramatically and rarely takes any pain medication.  He has used Excedrin at times but rarely Percocet.  He started [place] treatment under the care of Dr. Junious Silk without any major complications.  Denies hot flashes, weight gain or excessive fatigue.  His performance status and quality of life remains excellent.  He denies any other discomfort or decline in his performance status or quality of life.  He does not report any headaches, blurry vision, syncope or seizures.  He denies any confusion or lethargy.  Does not report any fevers, chills or sweats.  Does not report any cough, wheezing or hemoptysis.  Does not report any chest pain, palpitation, orthopnea or leg edema.  Does not report any nausea, vomiting or abdominal pain.  Does not report any constipation or diarrhea.  Does not report any skeletal complaints.    Does not report frequency, urgency or hematuria.  Does not report any skin rashes or lesions. Does not report any heat or cold intolerance.  Does not report any lymphadenopathy or petechiae.  Does not report any anxiety or depression.  Remaining review of systems is negative.    Medications: I have reviewed the patient's current medications.  Current  Outpatient Medications  Medication Sig Dispense Refill  . amLODipine (NORVASC) 10 MG tablet Take 1 tablet (10 mg total) by mouth daily. 30 tablet 0  . aspirin EC 81 MG tablet Take 81 mg by mouth daily.    Marland Kitchen atorvastatin (LIPITOR) 10 MG tablet Take 1 tablet (10 mg total) by mouth daily. 30 tablet 11  . clopidogrel (PLAVIX) 75 MG tablet Take 1 tablet (75 mg total) by mouth daily. 30 tablet 11  . famotidine (PEPCID) 10 MG tablet Take 10 mg by mouth daily.    . finasteride (PROSCAR) 5 MG tablet Take 5 mg by mouth daily.    . hydrochlorothiazide (HYDRODIURIL) 12.5 MG tablet Take 12.5 mg by mouth daily.  12  . ibuprofen (ADVIL,MOTRIN) 200 MG tablet Take 200 mg by mouth every 6 (six) hours as needed for headache or moderate pain.     . Multiple Vitamins-Minerals (ONE DAILY MULTIVITAMIN MEN PO) Take 1 tablet by mouth daily.    Marland Kitchen oxyCODONE-acetaminophen (PERCOCET) 5-325 MG tablet Take 1 tablet by mouth every 6 (six) hours as needed for severe pain (may cause constipation). 60 tablet 0  . predniSONE (DELTASONE) 20 MG tablet   0   No current facility-administered medications for this visit.      Allergies: No Known Allergies  Past Medical History, Surgical history, Social history, and Family History were reviewed and updated.   Physical Exam: Blood pressure (!) 156/88, pulse 75, temperature 98.7 F (37.1 C), temperature source Oral, resp. rate 18, height 5\' 10"  (1.778 m), weight 229 lb 1.6 oz (  103.9 kg), SpO2 99 %.   ECOG: 0 General appearance: alert and cooperative appeared without distress. Head: Normocephalic, without obvious abnormality Oropharynx: No oral thrush or ulcers. Eyes: No scleral icterus.  Pupils are equal and round reactive to light. Lymph nodes: Cervical, supraclavicular, and axillary nodes normal. Heart:regular rate and rhythm, S1, S2 normal, no murmur, click, rub or gallop Lung:chest clear, no wheezing, rales, normal symmetric air entry Abdomin: soft, non-tender, without  masses or organomegaly. Neurological: No motor, sensory deficits.  Intact deep tendon reflexes. Skin: No rashes or lesions.  No ecchymosis or petechiae. Musculoskeletal: No joint deformity or effusion. Psychiatric: Mood and affect are appropriate.    Lab Results: Lab Results  Component Value Date   WBC 9.8 09/09/2017   HGB 13.7 09/09/2017   HCT 40.4 09/09/2017   MCV 88.8 09/09/2017   PLT 205 09/09/2017     Chemistry      Component Value Date/Time   NA 139 09/08/2017 0828   K 4.6 09/08/2017 0828   CL 103 09/08/2017 0828   CO2 29 07/12/2017 0324   BUN 22 09/08/2017 0828   CREATININE 1.10 09/08/2017 0828      Component Value Date/Time   CALCIUM 8.9 07/12/2017 0324   ALKPHOS 90 07/12/2017 0324   AST 22 07/12/2017 0324   ALT 28 07/12/2017 0324   BILITOT 0.6 07/12/2017 0324        Impression and Plan:   68 year old man with the following:  1.    Advanced prostate cancer with diffuse bone disease that is currently castration-sensitive diagnosed in November 2019.    He presented with Gleason score 4+4 = 8 and a PSA of 48.Marland Kitchen  Options of therapy were reviewed again with the patient in addition to androgen deprivation.  These options would include Zytiga, Xtandi and systemic chemotherapy.  Risks and benefits of all these approaches were reviewed again.  Taxotere chemotherapy specifically was reviewed.  Complications include nausea, vomiting, myelosuppression, neutropenia, neutropenic sepsis and hair loss.  Infusion related complications and peripheral neuropathy were also emphasized.  The benefit at this point will be improvement in his overall survival, progression free survival as well as delay in the need for subsequent therapy.  6 cycles of Taxotere chemotherapy will be needed at this time.  After discussion today, he is agreeable to proceed.  2.  Androgen deprivation therapy: He is currently receiving under the care of Dr. Junious Silk.   He is curren receiving Firmagon which we  will switch to Lupron in the future.  I recommended continuing this indefinitely.   3.  Bone directed therapy: I recommended proceeding with calcium and vitamin D supplements and subsequently obtaining dental clearance.  He will be a candidate for Xgeva after that to prevent skeletal related events.  Hypocalcemia and osteonecrosis of the jaw potential complications associated with this therapy.  He will likely receive this under the care of Dr. Junious Silk.   4.  Bone pain: He has been receiving Percocet for it with possible palliative radiation therapy reserved if his pain is under poor control.  Pain has been improved with androgen deprivation therapy alone.  5.  IV access: Risks and benefits of a Port-A-Cath insertion was reviewed today.  These complications include leading, infection and thrombosis.  After discussion is agreeable to proceed.  Plavix will be withheld a week prior to his Port-A-Cath insertion.  6.  Antiemetics: Prescription for Compazine will be available to him upon the start of chemotherapy.  7.  Neutropenia prophylaxis: He will  receive growth factor support after each cycle of therapy.  8.  Prognosis and goals of care: Therapy remains palliative but his performance status is excellent and aggressive therapy is warranted.  He understand his disease is incurable but can be treated in palliated for an extended period of time.  9.  Follow-up: We will be in the immediate future to start systemic chemotherapy.  30 minutes was spent with the patient face-to-face today.  More than 50% of time was dedicated to discussing his treatment options, complications related to therapy and answering question regarding future management plan.   Zola Button, MD 1/6/20209:11 AM

## 2018-01-23 ENCOUNTER — Inpatient Hospital Stay: Payer: Medicare Other

## 2018-01-23 ENCOUNTER — Telehealth: Payer: Self-pay | Admitting: Medical Oncology

## 2018-01-24 DIAGNOSIS — C61 Malignant neoplasm of prostate: Secondary | ICD-10-CM | POA: Diagnosis not present

## 2018-01-25 ENCOUNTER — Telehealth: Payer: Self-pay | Admitting: Medical Oncology

## 2018-01-25 ENCOUNTER — Encounter: Payer: Self-pay | Admitting: Medical Oncology

## 2018-01-25 NOTE — Telephone Encounter (Signed)
Spoke with patient to inform him I was able to get his Lupron injection rescheduled to 1/16 at 1:00pm. He voiced understanding and thanked me for getting this rescheduled.

## 2018-01-25 NOTE — Telephone Encounter (Signed)
Spoke with [facility] to inform her patient has Lupron injection scheduled at same time as first chemo on 01/31/18. She rescheduled appointment with Dr. Junious Silk with injection to 1/16 at 1:00 pm.

## 2018-01-25 NOTE — Telephone Encounter (Signed)
Patient called stating he has first time Taxotere 1/15. He also has Lupron scheduled with Dr. Junious Silk. He called [facility] to get his appointment changed and he was informed he could not reschedule until mid February. I informed that I will call Dr. Lyndal Rainbow office and see if I can get his reschedule sooner. He voiced understanding.

## 2018-01-26 ENCOUNTER — Encounter: Payer: Self-pay | Admitting: Oncology

## 2018-01-26 NOTE — Progress Notes (Signed)
Called pt to introduce myself as his Arboriculturist and to discuss copay assistance.  He gave me consent to apply in his behalf so I applied to the [employer] [place] and he was approved for $3,500 for 12 months from 01/26/18 for Taxotere and Udenyca.  I also applied to the [employer] and he was approved for $7,300 from 10/28/17 to 01/26/19 for Taxotere.  I informed him of the [facility] and Prostate grants and went over what they cover.  He would like to apply so he will bring his proof of income on 01/31/18.  Once approved I will give him an expense sheet and my card for any questions or concerns he may have in the future.

## 2018-01-29 ENCOUNTER — Other Ambulatory Visit: Payer: Self-pay | Admitting: Radiology

## 2018-01-30 ENCOUNTER — Ambulatory Visit (HOSPITAL_COMMUNITY)
Admission: RE | Admit: 2018-01-30 | Discharge: 2018-01-30 | Disposition: A | Discharge status: Home / Self Care | Payer: Medicare Other | Source: Ambulatory Visit | Attending: Oncology | Admitting: Oncology

## 2018-01-30 ENCOUNTER — Encounter (HOSPITAL_COMMUNITY): Payer: Self-pay

## 2018-01-30 ENCOUNTER — Other Ambulatory Visit: Payer: Self-pay | Admitting: Oncology

## 2018-01-30 DIAGNOSIS — Z8249 Family history of ischemic heart disease and other diseases of the circulatory system: Secondary | ICD-10-CM | POA: Insufficient documentation

## 2018-01-30 DIAGNOSIS — Z5111 Encounter for antineoplastic chemotherapy: Secondary | ICD-10-CM | POA: Diagnosis not present

## 2018-01-30 DIAGNOSIS — Z95820 Peripheral vascular angioplasty status with implants and grafts: Secondary | ICD-10-CM | POA: Insufficient documentation

## 2018-01-30 DIAGNOSIS — Z452 Encounter for adjustment and management of vascular access device: Secondary | ICD-10-CM | POA: Diagnosis not present

## 2018-01-30 DIAGNOSIS — I70219 Atherosclerosis of native arteries of extremities with intermittent claudication, unspecified extremity: Secondary | ICD-10-CM | POA: Diagnosis not present

## 2018-01-30 DIAGNOSIS — Z79899 Other long term (current) drug therapy: Secondary | ICD-10-CM | POA: Insufficient documentation

## 2018-01-30 DIAGNOSIS — Z7982 Long term (current) use of aspirin: Secondary | ICD-10-CM | POA: Diagnosis not present

## 2018-01-30 DIAGNOSIS — C61 Malignant neoplasm of prostate: Secondary | ICD-10-CM

## 2018-01-30 DIAGNOSIS — Z7902 Long term (current) use of antithrombotics/antiplatelets: Secondary | ICD-10-CM | POA: Insufficient documentation

## 2018-01-30 DIAGNOSIS — N400 Enlarged prostate without lower urinary tract symptoms: Secondary | ICD-10-CM | POA: Insufficient documentation

## 2018-01-30 DIAGNOSIS — K219 Gastro-esophageal reflux disease without esophagitis: Secondary | ICD-10-CM | POA: Diagnosis not present

## 2018-01-30 DIAGNOSIS — E7800 Pure hypercholesterolemia, unspecified: Secondary | ICD-10-CM | POA: Diagnosis not present

## 2018-01-30 DIAGNOSIS — Z87891 Personal history of nicotine dependence: Secondary | ICD-10-CM | POA: Diagnosis not present

## 2018-01-30 HISTORY — PX: IR IMAGING GUIDED PORT INSERTION: IMG5740

## 2018-01-30 LAB — PROTIME-INR
INR: 0.92
Prothrombin Time: 12.3 seconds (ref 11.4–15.2)

## 2018-01-30 LAB — CBC WITH DIFFERENTIAL/PLATELET
Abs Immature Granulocytes: 0.05 10*3/uL (ref 0.00–0.07)
Basophils Absolute: 0 10*3/uL (ref 0.0–0.1)
Basophils Relative: 1 %
Eosinophils Absolute: 0.2 10*3/uL (ref 0.0–0.5)
Eosinophils Relative: 2 %
HCT: 43.7 % (ref 39.0–52.0)
Hemoglobin: 14.8 g/dL (ref 13.0–17.0)
Immature Granulocytes: 1 %
Lymphocytes Relative: 22 %
Lymphs Abs: 1.8 10*3/uL (ref 0.7–4.0)
MCH: 29.9 pg (ref 26.0–34.0)
MCHC: 33.9 g/dL (ref 30.0–36.0)
MCV: 88.3 fL (ref 80.0–100.0)
MONO ABS: 0.9 10*3/uL (ref 0.1–1.0)
Monocytes Relative: 10 %
NEUTROS ABS: 5.4 10*3/uL (ref 1.7–7.7)
Neutrophils Relative %: 64 %
Platelets: 211 10*3/uL (ref 150–400)
RBC: 4.95 MIL/uL (ref 4.22–5.81)
RDW: 12.9 % (ref 11.5–15.5)
WBC: 8.3 10*3/uL (ref 4.0–10.5)
nRBC: 0 % (ref 0.0–0.2)

## 2018-01-30 LAB — BASIC METABOLIC PANEL
Anion gap: 9 (ref 5–15)
BUN: 20 mg/dL (ref 8–23)
CO2: 24 mmol/L (ref 22–32)
Calcium: 9.5 mg/dL (ref 8.9–10.3)
Chloride: 106 mmol/L (ref 98–111)
Creatinine, Ser: 0.95 mg/dL (ref 0.61–1.24)
GFR calc Af Amer: >60 mL/min (ref >60)
Glucose, Bld: 144 mg/dL — ABNORMAL HIGH (ref 70–99)
Potassium: 3.7 mmol/L (ref 3.5–5.1)
Sodium: 139 mmol/L (ref 135–145)

## 2018-01-30 MED ORDER — MIDAZOLAM HCL 2 MG/2ML IJ SOLN
INTRAMUSCULAR | Status: AC | PRN
  Administered 2018-01-30 (×2): 1 mg via INTRAVENOUS

## 2018-01-30 MED ORDER — FENTANYL CITRATE (PF) 100 MCG/2ML IJ SOLN
INTRAMUSCULAR | Status: AC
  Filled 2018-01-30: qty 2

## 2018-01-30 MED ORDER — LIDOCAINE HCL 1 % IJ SOLN
INTRAMUSCULAR | Status: AC
  Filled 2018-01-30: qty 20

## 2018-01-30 MED ORDER — CEFAZOLIN SODIUM-DEXTROSE 2-4 GM/100ML-% IV SOLN
INTRAVENOUS | Status: AC
  Filled 2018-01-30: qty 100

## 2018-01-30 MED ORDER — HEPARIN SOD (PORK) LOCK FLUSH 100 UNIT/ML IV SOLN
INTRAVENOUS | Status: AC
  Filled 2018-01-30: qty 5

## 2018-01-30 MED ORDER — MIDAZOLAM HCL 2 MG/2ML IJ SOLN
INTRAMUSCULAR | Status: AC
  Filled 2018-01-30: qty 2

## 2018-01-30 MED ORDER — NALOXONE HCL 0.4 MG/ML IJ SOLN
INTRAMUSCULAR | Status: AC
  Filled 2018-01-30: qty 1

## 2018-01-30 MED ORDER — HEPARIN SOD (PORK) LOCK FLUSH 100 UNIT/ML IV SOLN
INTRAVENOUS | Status: AC | PRN
  Administered 2018-01-30: 500 [IU] via INTRAVENOUS

## 2018-01-30 MED ORDER — FENTANYL CITRATE (PF) 100 MCG/2ML IJ SOLN
INTRAMUSCULAR | Status: AC | PRN
  Administered 2018-01-30 (×3): 50 ug via INTRAVENOUS

## 2018-01-30 MED ORDER — CEFAZOLIN SODIUM-DEXTROSE 2-4 GM/100ML-% IV SOLN: 2.0000 g | INTRAVENOUS | Status: DC

## 2018-01-30 MED ORDER — FLUMAZENIL 0.5 MG/5ML IV SOLN
INTRAVENOUS | Status: AC
  Filled 2018-01-30: qty 5

## 2018-01-30 MED ORDER — SODIUM CHLORIDE 0.9 % IV SOLN
INTRAVENOUS | Status: DC
  Administered 2018-01-30: 10:00:00 via INTRAVENOUS

## 2018-01-30 MED ORDER — CEFAZOLIN (ANCEF) 1 G IV SOLR
INTRAVENOUS | Status: AC | PRN
  Administered 2018-01-30: 2 g

## 2018-01-30 NOTE — Consult Note (Signed)
Chief Complaint: Patient was seen in consultation today for Port-A-Cath placement  Referring Physician(s): Wyatt Portela  Supervising Physician: Aletta Edouard  Patient Status: [place]  History of Present Illness: Dennis Patel is a 68 y.o. male with history of metastatic prostate carcinoma who presents today for Port-A-Cath placement for chemotherapy.  Additional medical history as listed below.  Past Medical History:  Diagnosis Date  . Atherosclerosis   . BPH (benign prostatic hyperplasia)   . Claudication ([place])   . Elevated PSA   . Family history of adverse reaction to anesthesia    ' My Brother had an allergic reaction "  . GERD (gastroesophageal reflux disease)   . History of kidney stones   . Peripheral artery disease ([place])   . Peripheral vascular disease ([place])   . Pure hypercholesterolemia   . 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 ABDOMINAL ANGIOGRAM N/A 03/17/2011   Procedure: ABDOMINAL ANGIOGRAM;  Surgeon: Jettie Booze, MD;  Location: [facility] CATH LAB;  Service: Cardiovascular;  Laterality: N/A;  . ABDOMINAL AORTOGRAM W/LOWER EXTREMITY N/A 03/17/2017   Procedure: ABDOMINAL AORTOGRAM W/LOWER EXTREMITY;  Surgeon: Angelia Mould, MD;  Location: [place] CV LAB;  Service: Cardiovascular;  Laterality: N/A;  . ABDOMINAL AORTOGRAM W/LOWER EXTREMITY N/A 09/08/2017   Procedure: ABDOMINAL AORTOGRAM W/LOWER EXTREMITY;  Surgeon: Angelia Mould, MD;  Location: [place] CV LAB;  Service: Cardiovascular;  Laterality: N/A;  . ILIAC ARTERY STENT    . LOWER EXTREMITY ANGIOGRAM N/A 03/17/2011   Procedure: LOWER EXTREMITY ANGIOGRAM;  Surgeon: Jettie Booze, MD;  Location: [facility] CATH LAB;  Service: Cardiovascular;  Laterality: N/A;  . PERIPHERAL VASCULAR INTERVENTION Left 09/08/2017   Procedure: PERIPHERAL VASCULAR INTERVENTION;  Surgeon: Angelia Mould, MD;  Location: [place] CV  LAB;  Service: Cardiovascular;  Laterality: Left;  Common iliac    Allergies: Patient has no known allergies.  Medications: Prior to Admission medications   Medication Sig Start Date End Date Taking? Authorizing Provider  acetaminophen (TYLENOL) 325 MG tablet Take 650 mg by mouth every 6 (six) hours as needed.   Yes [provider]  amLODipine (NORVASC) 10 MG tablet Take 1 tablet (10 mg total) by mouth daily. 07/18/17  Yes Mikhail, Velta Addison, DO  aspirin EC 81 MG tablet Take 81 mg by mouth daily.   Yes [provider]  atorvastatin (LIPITOR) 10 MG tablet Take 1 tablet (10 mg total) by mouth daily. 09/08/17 09/08/18 Yes Angelia Mould, MD  calcium-vitamin D (OSCAL WITH D) 250-125 MG-UNIT tablet Take 1 tablet by mouth daily.   Yes [provider]  famotidine (PEPCID) 10 MG tablet Take 10 mg by mouth daily.   Yes [provider]  finasteride (PROSCAR) 5 MG tablet Take 5 mg by mouth daily.   Yes [provider]  hydrochlorothiazide (HYDRODIURIL) 12.5 MG tablet Take 12.5 mg by mouth daily. 09/01/17  Yes [provider]  Multiple Vitamins-Minerals (ONE DAILY MULTIVITAMIN MEN PO) Take 1 tablet by mouth daily.   Yes [provider]  oxyCODONE-acetaminophen (PERCOCET) 5-325 MG tablet Take 1 tablet by mouth every 6 (six) hours as needed for severe pain (may cause constipation). 12/29/17  Yes Wyatt Portela, MD  clopidogrel (PLAVIX) 75 MG tablet Take 1 tablet (75 mg total) by mouth daily. 09/08/17   Angelia Mould, MD  ibuprofen (ADVIL,MOTRIN) 200 MG tablet Take 200 mg by mouth every 6 (  six) hours as needed for headache or moderate pain.     [provider]  lidocaine-prilocaine (EMLA) cream Apply 1 application topically as needed. 01/22/18   Wyatt Portela, MD  predniSONE (DELTASONE) 20 MG tablet  12/25/17   [provider]  prochlorperazine (COMPAZINE) 10 MG tablet Take 1 tablet (10 mg total) by mouth every 6 (six)  hours as needed for nausea or vomiting. 01/22/18   Wyatt Portela, MD     Family History  Problem Relation Age of Onset  . Cancer Mother   . Diabetes Mother   . CAD Father   . Diabetes Sister   . CAD Son   . Diabetes Son   . Heart disease Son     Social History   Socioeconomic History  . Marital status: Married    Spouse name: Not on file  . Number of children: Not on file  . Years of education: Not on file  . Highest education level: Not on file  Occupational History  . Not on file  Social Needs  . Financial resource strain: Not on file  . Food insecurity:    Worry: Not on file    Inability: Not on file  . Transportation needs:    Medical: Not on file    Non-medical: Not on file  Tobacco Use  . Smoking status: Former Smoker    Types: Cigarettes    Last attempt to quit: 2005    Years since quitting: 15.0  . Smokeless tobacco: Never Used  Substance and Sexual Activity  . Alcohol use: Never    Frequency: Never  . Drug use: Never  . Sexual activity: Not on file  Lifestyle  . Physical activity:    Days per week: Not on file    Minutes per session: Not on file  . Stress: Not on file  Relationships  . Social connections:    Talks on phone: Not on file    Gets together: Not on file    Attends religious service: Not on file    Active member of club or organization: Not on file    Attends meetings of clubs or organizations: Not on file    Relationship status: Not on file  Other Topics Concern  . Not on file  Social History Narrative   ** Merged History Encounter **          Review of Systems currently denies fever, chest pain, dyspnea, cough, domino pain, nausea, vomiting or bleeding.  He does have occasional headaches as well as diffuse bone pain secondary to prostate cancer.  Vital Signs: BP (!) 157/95 (BP Location: Right Arm)   Pulse 72   Temp 97.6 F (36.4 C) (Oral)   Resp 18   SpO2 97%   Physical Exam awake, alert.  Chest clear to auscultation  bilaterally.  Heart with regular rate and rhythm.  Abdomen soft, positive bowel sounds, nontender.  No lower extremity edema.  Imaging: No results found.  Labs:  CBC: Recent Labs    07/12/17 0324 07/13/17 0543 09/08/17 0828 09/09/17 0256 01/30/18 1026  WBC 17.2* 11.1*  --  9.8 8.3  HGB 13.8 15.0 15.0 13.7 14.8  HCT 41.9 44.9 44.0 40.4 43.7  PLT 225 206  --  205 211    COAGS: Recent Labs    01/30/18 1026  INR 0.92    BMP: Recent Labs    07/11/17 0644 07/11/17 1043 07/12/17 0324 09/08/17 0828 01/30/18 1026  NA 139 139 140  139 139  K 5.6* 5.0 4.3 4.6 3.7  CL 105 104 102 103 106  CO2 24 25 29   --  24  GLUCOSE 224* 192* 114* 147* 144*  BUN 24* 24* 21 22 20   CALCIUM 9.1 9.1 8.9  --  9.5  CREATININE 1.30* 1.26* 1.09 1.10 0.95  GFRNONAA 55* 57* >60  --  >60  GFRAA >60 >60 >60  --  >60    LIVER FUNCTION TESTS: Recent Labs    07/10/17 2357 07/11/17 0644 07/11/17 1043 07/12/17 0324  BILITOT 0.4 0.9 0.8 0.6  AST 30 29 25 22   ALT 36 37 34 28  ALKPHOS 101 100 103 90  PROT 7.1 7.1 7.1 6.4*  ALBUMIN 4.1 4.0 4.0 3.8    TUMOR MARKERS: No results for input(s): AFPTM, CEA, CA199, CHROMGRNA in the last 8760 hours.  Assessment and Plan: 68 y.o. male with history of metastatic prostate carcinoma who presents today for Port-A-Cath placement for chemotherapy.Risks and benefits of image guided port-a-catheter placement was discussed with the patient/sister including, but not limited to bleeding, infection, pneumothorax, or fibrin sheath development and need for additional procedures.  All of the patient's questions were answered, patient is agreeable to proceed. Consent signed and in chart.     Thank you for this interesting consult.  I greatly enjoyed meeting DENIZ HANNAN and look forward to participating in their care.  A copy of this report was sent to the requesting provider on this date.  Electronically Signed: D. Rowe Robert, PA-C 01/30/2018, 11:30  AM   I spent a total of 25 minutes    in face to face in clinical consultation, greater than 50% of which was counseling/coordinating care for Port-A-Cath placement

## 2018-01-30 NOTE — Procedures (Signed)
Interventional Radiology Procedure Note  Procedure: Single Lumen Power Port Placement    Access:  Right IJ vein.  Findings: Catheter tip positioned at SVC/RA junction. Port is ready for immediate use.   Complications: None  EBL: < 10 mL  Recommendations:  - Ok to shower in 24 hours - Do not submerge for 7 days - Routine line care   Virat Prather T. Dameian Crisman, M.D Pager:  [phone]   

## 2018-01-30 NOTE — Progress Notes (Signed)
PA for emla cream has been submitted. 

## 2018-01-30 NOTE — Discharge Instructions (Signed)
You may remove your dressing and shower tomorrow. ° °DO NOT use EMLA cream for 2 weeks after port placement as this cream will remove surgical glue on your incision. ° °Implanted Port Insertion, Care After °This sheet gives you information about how to care for yourself after your procedure. Your health care provider may also give you more specific instructions. If you have problems or questions, contact your health care provider. °What can I expect after the procedure? °After the procedure, it is common to have: °· Discomfort at the port insertion site. °· Bruising on the skin over the port. This should improve over 3-4 days. °Follow these instructions at home: °Port care °· After your port is placed, you will get a manufacturer's information card. The card has information about your port. Keep this card with you at all times. °· Take care of the port as told by your health care provider. Ask your health care provider if you or a family member can get training for taking care of the port at home. A home health care nurse may also take care of the port. °· Make sure to remember what type of port you have. °Incision care ° °  ° °· Follow instructions from your health care provider about how to take care of your port insertion site. Make sure you: °? Wash your hands with soap and water before and after you change your bandage (dressing). If soap and water are not available, use hand sanitizer. °? Change your dressing as told by your health care provider. °? Leave stitches (sutures), skin glue, or adhesive strips in place. These skin closures may need to stay in place for 2 weeks or longer. If adhesive strip edges start to loosen and curl up, you may trim the loose edges. Do not remove adhesive strips completely unless your health care provider tells you to do that. °· Check your port insertion site every day for signs of infection. Check for: °? Redness, swelling, or pain. °? Fluid or blood. °? Warmth. °? Pus or a bad  smell. °Activity °· Return to your normal activities as told by your health care provider. Ask your health care provider what activities are safe for you. °· Do not lift anything that is heavier than 10 lb (4.5 kg), or the limit that you are told, until your health care provider says that it is safe. °General instructions °· Take over-the-counter and prescription medicines only as told by your health care provider. °· Do not take baths, swim, or use a hot tub until your health care provider approves. Ask your health care provider if you may take showers. You may only be allowed to take sponge baths. °· Do not drive for 24 hours if you were given a sedative during your procedure. °· Wear a medical alert bracelet in case of an emergency. This will tell any health care providers that you have a port. °· Keep all follow-up visits as told by your health care provider. This is important. °Contact a health care provider if: °· You cannot flush your port with saline as directed, or you cannot draw blood from the port. °· You have a fever or chills. °· You have redness, swelling, or pain around your port insertion site. °· You have fluid or blood coming from your port insertion site. °· Your port insertion site feels warm to the touch. °· You have pus or a bad smell coming from the port insertion site. °Get help right away if: °·   You have chest pain or shortness of breath. °· You have bleeding from your port that you cannot control. °Summary °· Take care of the port as told by your health care provider. Keep the manufacturer's information card with you at all times. °· Change your dressing as told by your health care provider. °· Contact a health care provider if you have a fever or chills or if you have redness, swelling, or pain around your port insertion site. °· Keep all follow-up visits as told by your health care provider. °This information is not intended to replace advice given to you by your health care provider.  Make sure you discuss any questions you have with your health care provider. °Document Released: 10/24/2012 Document Revised: 08/01/2017 Document Reviewed: 08/01/2017 °Elsevier Interactive Patient Education © 2019 Elsevier Inc. ° ° °Moderate Conscious Sedation, Adult, Care After °These instructions provide you with information about caring for yourself after your procedure. Your health care provider may also give you more specific instructions. Your treatment has been planned according to current medical practices, but problems sometimes occur. Call your health care provider if you have any problems or questions after your procedure. °What can I expect after the procedure? °After your procedure, it is common: °· To feel sleepy for several hours. °· To feel clumsy and have poor balance for several hours. °· To have poor judgment for several hours. °· To vomit if you eat too soon. °Follow these instructions at home: °For at least 24 hours after the procedure: ° °· Do not: °? Participate in activities where you could fall or become injured. °? Drive. °? Use heavy machinery. °? Drink alcohol. °? Take sleeping pills or medicines that cause drowsiness. °? Make important decisions or sign legal documents. °? Take care of children on your own. °· Rest. °Eating and drinking °· Follow the diet recommended by your health care provider. °· If you vomit: °? Drink water, juice, or soup when you can drink without vomiting. °? Make sure you have little or no nausea before eating solid foods. °General instructions °· Have a responsible adult stay with you until you are awake and alert. °· Take over-the-counter and prescription medicines only as told by your health care provider. °· If you smoke, do not smoke without supervision. °· Keep all follow-up visits as told by your health care provider. This is important. °Contact a health care provider if: °· You keep feeling nauseous or you keep vomiting. °· You feel light-headed. °· You  develop a rash. °· You have a fever. °Get help right away if: °· You have trouble breathing. °This information is not intended to replace advice given to you by your health care provider. Make sure you discuss any questions you have with your health care provider. °Document Released: 10/24/2012 Document Revised: 06/08/2015 Document Reviewed: 04/25/2015 °Elsevier Interactive Patient Education © 2019 Elsevier Inc. ° °

## 2018-01-31 ENCOUNTER — Encounter: Payer: Self-pay | Admitting: Oncology

## 2018-01-31 ENCOUNTER — Inpatient Hospital Stay: Payer: Medicare Other

## 2018-01-31 ENCOUNTER — Encounter: Payer: Self-pay | Admitting: Medical Oncology

## 2018-01-31 VITALS — BP 148/73 | HR 58 | Temp 98.0°F | Resp 16

## 2018-01-31 DIAGNOSIS — C61 Malignant neoplasm of prostate: Secondary | ICD-10-CM

## 2018-01-31 DIAGNOSIS — G893 Neoplasm related pain (acute) (chronic): Secondary | ICD-10-CM | POA: Diagnosis not present

## 2018-01-31 DIAGNOSIS — C7951 Secondary malignant neoplasm of bone: Secondary | ICD-10-CM | POA: Diagnosis not present

## 2018-01-31 DIAGNOSIS — Z95828 Presence of other vascular implants and grafts: Secondary | ICD-10-CM

## 2018-01-31 DIAGNOSIS — Z5111 Encounter for antineoplastic chemotherapy: Secondary | ICD-10-CM | POA: Diagnosis not present

## 2018-01-31 DIAGNOSIS — Z7982 Long term (current) use of aspirin: Secondary | ICD-10-CM | POA: Diagnosis not present

## 2018-01-31 DIAGNOSIS — Z7689 Persons encountering health services in other specified circumstances: Secondary | ICD-10-CM | POA: Diagnosis not present

## 2018-01-31 DIAGNOSIS — Z79899 Other long term (current) drug therapy: Secondary | ICD-10-CM | POA: Diagnosis not present

## 2018-01-31 DIAGNOSIS — Z191 Hormone sensitive malignancy status: Secondary | ICD-10-CM | POA: Diagnosis not present

## 2018-01-31 LAB — CBC WITH DIFFERENTIAL (CANCER CENTER ONLY)
Abs Immature Granulocytes: 0.06 10*3/uL (ref 0.00–0.07)
Basophils Absolute: 0 10*3/uL (ref 0.0–0.1)
Basophils Relative: 1 %
EOS ABS: 0.1 10*3/uL (ref 0.0–0.5)
Eosinophils Relative: 2 %
HCT: 40.1 % (ref 39.0–52.0)
Hemoglobin: 14 g/dL (ref 13.0–17.0)
Immature Granulocytes: 1 %
Lymphocytes Relative: 21 %
Lymphs Abs: 1.8 10*3/uL (ref 0.7–4.0)
MCH: 29.7 pg (ref 26.0–34.0)
MCHC: 34.9 g/dL (ref 30.0–36.0)
MCV: 85.1 fL (ref 80.0–100.0)
Monocytes Absolute: 1 10*3/uL (ref 0.1–1.0)
Monocytes Relative: 12 %
Neutro Abs: 5.5 10*3/uL (ref 1.7–7.7)
Neutrophils Relative %: 63 %
Platelet Count: 218 10*3/uL (ref 150–400)
RBC: 4.71 MIL/uL (ref 4.22–5.81)
RDW: 12.5 % (ref 11.5–15.5)
WBC Count: 8.6 10*3/uL (ref 4.0–10.5)
nRBC: 0 % (ref 0.0–0.2)

## 2018-01-31 LAB — CMP (CANCER CENTER ONLY)
ALT: 51 U/L — ABNORMAL HIGH (ref 0–44)
AST: 30 U/L (ref 15–41)
Albumin: 4 g/dL (ref 3.5–5.0)
Alkaline Phosphatase: 182 U/L — ABNORMAL HIGH (ref 38–126)
Anion gap: 9 (ref 5–15)
BUN: 14 mg/dL (ref 8–23)
CO2: 26 mmol/L (ref 22–32)
Calcium: 9.7 mg/dL (ref 8.9–10.3)
Chloride: 106 mmol/L (ref 98–111)
Creatinine: 0.86 mg/dL (ref 0.61–1.24)
GFR, Est AFR Am: >60 mL/min (ref >60)
GFR, Estimated: >60 mL/min (ref >60)
Glucose, Bld: 124 mg/dL — ABNORMAL HIGH (ref 70–99)
Potassium: 3.8 mmol/L (ref 3.5–5.1)
Sodium: 141 mmol/L (ref 135–145)
Total Bilirubin: 0.6 mg/dL (ref 0.3–1.2)
Total Protein: 6.8 g/dL (ref 6.5–8.1)

## 2018-01-31 MED ORDER — SODIUM CHLORIDE 0.9 % IV SOLN
75.0000 mg/m2 | Freq: Once | INTRAVENOUS | Status: AC
  Administered 2018-01-31: 170 mg via INTRAVENOUS
  Filled 2018-01-31: qty 17

## 2018-01-31 MED ORDER — SODIUM CHLORIDE 0.9 % IV SOLN
Freq: Once | INTRAVENOUS | Status: AC
  Administered 2018-01-31: 11:00:00 via INTRAVENOUS
  Filled 2018-01-31: qty 250

## 2018-01-31 MED ORDER — DEXAMETHASONE SODIUM PHOSPHATE 10 MG/ML IJ SOLN
10.0000 mg | Freq: Once | INTRAMUSCULAR | Status: AC
  Administered 2018-01-31: 10 mg via INTRAVENOUS

## 2018-01-31 MED ORDER — HEPARIN SOD (PORK) LOCK FLUSH 100 UNIT/ML IV SOLN
500.0000 [IU] | Freq: Once | INTRAVENOUS | Status: AC | PRN
  Administered 2018-01-31: 500 [IU]
  Filled 2018-01-31: qty 5

## 2018-01-31 MED ORDER — DEXAMETHASONE SODIUM PHOSPHATE 10 MG/ML IJ SOLN
INTRAMUSCULAR | Status: AC
  Filled 2018-01-31: qty 1

## 2018-01-31 MED ORDER — SODIUM CHLORIDE 0.9% FLUSH
10.0000 mL | INTRAVENOUS | Status: DC | PRN
  Administered 2018-01-31: 10 mL
  Filled 2018-01-31: qty 10

## 2018-01-31 NOTE — Progress Notes (Signed)
Pt is approved for the $200 Prostate grant and the $700 [place].

## 2018-01-31 NOTE — Patient Instructions (Signed)
[facility] Discharge Instructions for Patients Receiving Chemotherapy  Today you received the following chemotherapy agents Taxotere  To help prevent nausea and vomiting after your treatment, we encourage you to take your nausea medication as directed If you develop nausea and vomiting that is not controlled by your nausea medication, call the clinic.   BELOW ARE SYMPTOMS THAT SHOULD BE REPORTED IMMEDIATELY:  *FEVER GREATER THAN 100.5 F  *CHILLS WITH OR WITHOUT FEVER  NAUSEA AND VOMITING THAT IS NOT CONTROLLED WITH YOUR NAUSEA MEDICATION  *UNUSUAL SHORTNESS OF BREATH  *UNUSUAL BRUISING OR BLEEDING  TENDERNESS IN MOUTH AND THROAT WITH OR WITHOUT PRESENCE OF ULCERS  *URINARY PROBLEMS  *BOWEL PROBLEMS  UNUSUAL RASH Items with * indicate a potential emergency and should be followed up as soon as possible.  Feel free to call the clinic should you have any questions or concerns. The clinic phone number is [phone].  Please show the [place] at check-in to the Emergency Department and triage nurse.   Docetaxel (Taxotere) injection What is this medicine? DOCETAXEL (doe se TAX el) is a chemotherapy drug. It targets fast dividing cells, like cancer cells, and causes these cells to die. This medicine is used to treat many types of cancers like breast cancer, certain stomach cancers, head and neck cancer, lung cancer, and prostate cancer. This medicine may be used for other purposes; ask your health care provider or pharmacist if you have questions. COMMON BRAND NAME(S): Docefrez, Taxotere What should I tell my health care provider before I take this medicine? They need to know if you have any of these conditions: -infection (especially a virus infection such as chickenpox, cold sores, or herpes) -liver disease -low blood counts, like low white cell, platelet, or red cell counts -an unusual or allergic reaction to docetaxel, polysorbate 80, other  chemotherapy agents, other medicines, foods, dyes, or preservatives -pregnant or trying to get pregnant -breast-feeding How should I use this medicine? This drug is given as an infusion into a vein. It is administered in a hospital or clinic by a specially trained health care professional. Talk to your pediatrician regarding the use of this medicine in children. Special care may be needed. Overdosage: If you think you have taken too much of this medicine contact a poison control center or emergency room at once. NOTE: This medicine is only for you. Do not share this medicine with others. What if I miss a dose? It is important not to miss your dose. Call your doctor or health care professional if you are unable to keep an appointment. What may interact with this medicine? -cyclosporine -erythromycin -ketoconazole -medicines to increase blood counts like filgrastim, pegfilgrastim, sargramostim -vaccines Talk to your doctor or health care professional before taking any of these medicines: -acetaminophen -aspirin -ibuprofen -ketoprofen -naproxen This list may not describe all possible interactions. Give your health care provider a list of all the medicines, herbs, non-prescription drugs, or dietary supplements you use. Also tell them if you smoke, drink alcohol, or use illegal drugs. Some items may interact with your medicine. What should I watch for while using this medicine? Your condition will be monitored carefully while you are receiving this medicine. You will need important blood work done while you are taking this medicine. This drug may make you feel generally unwell. This is not uncommon, as chemotherapy can affect healthy cells as well as cancer cells. Report any side effects. Continue your course of treatment even though you feel ill unless  your doctor tells you to stop. In some cases, you may be given additional medicines to help with side effects. Follow all directions for their  use. Call your doctor or health care professional for advice if you get a fever, chills or sore throat, or other symptoms of a cold or flu. Do not treat yourself. This drug decreases your body's ability to fight infections. Try to avoid being around people who are sick. This medicine may increase your risk to bruise or bleed. Call your doctor or health care professional if you notice any unusual bleeding. This medicine may contain alcohol in the product. You may get drowsy or dizzy. Do not drive, use machinery, or do anything that needs mental alertness until you know how this medicine affects you. Do not stand or sit up quickly, especially if you are an older patient. This reduces the risk of dizzy or fainting spells. Avoid alcoholic drinks. Do not become pregnant while taking this medicine or for 6 months after stopping it. Women should inform their doctor if they wish to become pregnant or think they might be pregnant. Men should not father a child while taking this medicine and for 3 months after stopping it. There is a potential for serious side effects to an unborn child. Talk to your health care professional or pharmacist for more information. Do not breast-feed an infant while taking this medicine or for 2 weeks after stopping it. This may interfere with the ability to father a child. You should talk to your doctor or health care professional if you are concerned about your fertility. What side effects may I notice from receiving this medicine? Side effects that you should report to your doctor or health care professional as soon as possible: -allergic reactions like skin rash, itching or hives, swelling of the face, lips, or tongue -low blood counts - This drug may decrease the number of white blood cells, red blood cells and platelets. You may be at increased risk for infections and bleeding. -signs of infection - fever or chills, cough, sore throat, pain or difficulty passing urine -signs of  decreased platelets or bleeding - bruising, pinpoint red spots on the skin, black, tarry stools, nosebleeds -signs of decreased red blood cells - unusually weak or tired, fainting spells, lightheadedness -breathing problems -fast or irregular heartbeat -low blood pressure -mouth sores -nausea and vomiting -pain, swelling, redness or irritation at the injection site -pain, tingling, numbness in the hands or feet -swelling of the ankle, feet, hands -weight gain Side effects that usually do not require medical attention (report to your doctor or health care professional if they continue or are bothersome): -bone pain -complete hair loss including hair on your head, underarms, pubic hair, eyebrows, and eyelashes -diarrhea -excessive tearing -changes in the color of fingernails -loosening of the fingernails -nausea -muscle pain -red flush to skin -sweating -weak or tired This list may not describe all possible side effects. Call your doctor for medical advice about side effects. You may report side effects to FDA at 1-800-FDA-1088. Where should I keep my medicine? This drug is given in a hospital or clinic and will not be stored at home. NOTE: This sheet is a summary. It may not cover all possible information. If you have questions about this medicine, talk to your doctor, pharmacist, or health care provider.  2019 Elsevier/Gold Standard (2017-01-30 12:07:21)

## 2018-02-01 DIAGNOSIS — C61 Malignant neoplasm of prostate: Secondary | ICD-10-CM | POA: Diagnosis not present

## 2018-02-01 DIAGNOSIS — C7951 Secondary malignant neoplasm of bone: Secondary | ICD-10-CM | POA: Diagnosis not present

## 2018-02-01 LAB — PROSTATE-SPECIFIC AG, SERUM (LABCORP): Prostate Specific Ag, Serum: 26.5 ng/mL — ABNORMAL HIGH (ref 0.0–4.0)

## 2018-02-02 ENCOUNTER — Inpatient Hospital Stay: Payer: Medicare Other

## 2018-02-02 VITALS — BP 140/76 | HR 89 | Temp 98.0°F | Resp 18

## 2018-02-02 DIAGNOSIS — Z7982 Long term (current) use of aspirin: Secondary | ICD-10-CM | POA: Diagnosis not present

## 2018-02-02 DIAGNOSIS — Z5111 Encounter for antineoplastic chemotherapy: Secondary | ICD-10-CM | POA: Diagnosis not present

## 2018-02-02 DIAGNOSIS — C61 Malignant neoplasm of prostate: Secondary | ICD-10-CM

## 2018-02-02 DIAGNOSIS — G893 Neoplasm related pain (acute) (chronic): Secondary | ICD-10-CM | POA: Diagnosis not present

## 2018-02-02 DIAGNOSIS — C7951 Secondary malignant neoplasm of bone: Secondary | ICD-10-CM | POA: Diagnosis not present

## 2018-02-02 DIAGNOSIS — Z191 Hormone sensitive malignancy status: Secondary | ICD-10-CM | POA: Diagnosis not present

## 2018-02-02 DIAGNOSIS — Z79899 Other long term (current) drug therapy: Secondary | ICD-10-CM | POA: Diagnosis not present

## 2018-02-02 DIAGNOSIS — Z7689 Persons encountering health services in other specified circumstances: Secondary | ICD-10-CM | POA: Diagnosis not present

## 2018-02-02 MED ORDER — PEGFILGRASTIM-CBQV 6 MG/0.6ML ~~LOC~~ SOSY
PREFILLED_SYRINGE | SUBCUTANEOUS | Status: AC
  Filled 2018-02-02: qty 0.6

## 2018-02-02 MED ORDER — PEGFILGRASTIM-CBQV 6 MG/0.6ML ~~LOC~~ SOSY
6.0000 mg | PREFILLED_SYRINGE | Freq: Once | SUBCUTANEOUS | Status: AC
  Administered 2018-02-02: 6 mg via SUBCUTANEOUS

## 2018-02-07 DIAGNOSIS — Z5111 Encounter for antineoplastic chemotherapy: Secondary | ICD-10-CM | POA: Diagnosis not present

## 2018-02-07 DIAGNOSIS — C61 Malignant neoplasm of prostate: Secondary | ICD-10-CM | POA: Diagnosis not present

## 2018-02-07 DIAGNOSIS — C7951 Secondary malignant neoplasm of bone: Secondary | ICD-10-CM | POA: Diagnosis not present

## 2018-02-22 ENCOUNTER — Inpatient Hospital Stay: Payer: Medicare Other | Attending: Oncology | Admitting: Oncology

## 2018-02-22 ENCOUNTER — Encounter: Payer: Self-pay | Admitting: Medical Oncology

## 2018-02-22 ENCOUNTER — Inpatient Hospital Stay: Payer: Medicare Other

## 2018-02-22 ENCOUNTER — Telehealth: Payer: Self-pay | Admitting: Oncology

## 2018-02-22 VITALS — BP 134/70 | HR 72 | Temp 98.1°F | Resp 17 | Ht 70.0 in | Wt 232.5 lb

## 2018-02-22 DIAGNOSIS — C7951 Secondary malignant neoplasm of bone: Secondary | ICD-10-CM | POA: Diagnosis not present

## 2018-02-22 DIAGNOSIS — Z95828 Presence of other vascular implants and grafts: Secondary | ICD-10-CM

## 2018-02-22 DIAGNOSIS — M2550 Pain in unspecified joint: Secondary | ICD-10-CM | POA: Diagnosis not present

## 2018-02-22 DIAGNOSIS — G629 Polyneuropathy, unspecified: Secondary | ICD-10-CM | POA: Insufficient documentation

## 2018-02-22 DIAGNOSIS — G4700 Insomnia, unspecified: Secondary | ICD-10-CM | POA: Insufficient documentation

## 2018-02-22 DIAGNOSIS — C61 Malignant neoplasm of prostate: Secondary | ICD-10-CM

## 2018-02-22 DIAGNOSIS — Z79899 Other long term (current) drug therapy: Secondary | ICD-10-CM | POA: Diagnosis not present

## 2018-02-22 DIAGNOSIS — R5383 Other fatigue: Secondary | ICD-10-CM | POA: Insufficient documentation

## 2018-02-22 DIAGNOSIS — M791 Myalgia, unspecified site: Secondary | ICD-10-CM | POA: Insufficient documentation

## 2018-02-22 DIAGNOSIS — R110 Nausea: Secondary | ICD-10-CM | POA: Diagnosis not present

## 2018-02-22 DIAGNOSIS — Z7982 Long term (current) use of aspirin: Secondary | ICD-10-CM | POA: Insufficient documentation

## 2018-02-22 DIAGNOSIS — Z191 Hormone sensitive malignancy status: Secondary | ICD-10-CM | POA: Insufficient documentation

## 2018-02-22 DIAGNOSIS — Z7689 Persons encountering health services in other specified circumstances: Secondary | ICD-10-CM | POA: Insufficient documentation

## 2018-02-22 DIAGNOSIS — Z5111 Encounter for antineoplastic chemotherapy: Secondary | ICD-10-CM | POA: Insufficient documentation

## 2018-02-22 LAB — CBC WITH DIFFERENTIAL (CANCER CENTER ONLY)
Abs Immature Granulocytes: 0.05 10*3/uL (ref 0.00–0.07)
BASOS PCT: 1 %
Basophils Absolute: 0.1 10*3/uL (ref 0.0–0.1)
Eosinophils Absolute: 0 10*3/uL (ref 0.0–0.5)
Eosinophils Relative: 0 %
HCT: 36.2 % — ABNORMAL LOW (ref 39.0–52.0)
Hemoglobin: 12.1 g/dL — ABNORMAL LOW (ref 13.0–17.0)
Immature Granulocytes: 1 %
Lymphocytes Relative: 18 %
Lymphs Abs: 1.2 10*3/uL (ref 0.7–4.0)
MCH: 29.9 pg (ref 26.0–34.0)
MCHC: 33.4 g/dL (ref 30.0–36.0)
MCV: 89.4 fL (ref 80.0–100.0)
Monocytes Absolute: 1.1 10*3/uL — ABNORMAL HIGH (ref 0.1–1.0)
Monocytes Relative: 17 %
NEUTROS ABS: 4.3 10*3/uL (ref 1.7–7.7)
Neutrophils Relative %: 63 %
PLATELETS: 298 10*3/uL (ref 150–400)
RBC: 4.05 MIL/uL — ABNORMAL LOW (ref 4.22–5.81)
RDW: 14.8 % (ref 11.5–15.5)
WBC: 6.8 10*3/uL (ref 4.0–10.5)
nRBC: 0 % (ref 0.0–0.2)

## 2018-02-22 LAB — CMP (CANCER CENTER ONLY)
ALT: 46 U/L — ABNORMAL HIGH (ref 0–44)
AST: 29 U/L (ref 15–41)
Albumin: 3.6 g/dL (ref 3.5–5.0)
Alkaline Phosphatase: 107 U/L (ref 38–126)
Anion gap: 9 (ref 5–15)
BUN: 16 mg/dL (ref 8–23)
CO2: 24 mmol/L (ref 22–32)
Calcium: 9 mg/dL (ref 8.9–10.3)
Chloride: 108 mmol/L (ref 98–111)
Creatinine: 0.99 mg/dL (ref 0.61–1.24)
GFR, Est AFR Am: >60 mL/min (ref >60)
GFR, Estimated: >60 mL/min (ref >60)
GLUCOSE: 163 mg/dL — AB (ref 70–99)
Potassium: 4.1 mmol/L (ref 3.5–5.1)
SODIUM: 141 mmol/L (ref 135–145)
Total Bilirubin: 0.6 mg/dL (ref 0.3–1.2)
Total Protein: 6.2 g/dL — ABNORMAL LOW (ref 6.5–8.1)

## 2018-02-22 MED ORDER — SODIUM CHLORIDE 0.9 % IV SOLN
Freq: Once | INTRAVENOUS | Status: AC
  Administered 2018-02-22: 10:00:00 via INTRAVENOUS
  Filled 2018-02-22: qty 250

## 2018-02-22 MED ORDER — DEXAMETHASONE SODIUM PHOSPHATE 10 MG/ML IJ SOLN
INTRAMUSCULAR | Status: AC
  Filled 2018-02-22: qty 1

## 2018-02-22 MED ORDER — PROCHLORPERAZINE MALEATE 10 MG PO TABS: 10.0000 mg | ORAL_TABLET | Freq: Four times a day (QID) | ORAL | 0 refills | Status: DC | PRN

## 2018-02-22 MED ORDER — SODIUM CHLORIDE 0.9 % IV SOLN
75.0000 mg/m2 | Freq: Once | INTRAVENOUS | Status: AC
  Administered 2018-02-22: 170 mg via INTRAVENOUS
  Filled 2018-02-22: qty 17

## 2018-02-22 MED ORDER — SODIUM CHLORIDE 0.9% FLUSH
10.0000 mL | INTRAVENOUS | Status: DC | PRN
  Administered 2018-02-22: 10 mL
  Filled 2018-02-22: qty 10

## 2018-02-22 MED ORDER — HEPARIN SOD (PORK) LOCK FLUSH 100 UNIT/ML IV SOLN
500.0000 [IU] | Freq: Once | INTRAVENOUS | Status: AC | PRN
  Administered 2018-02-22: 500 [IU]
  Filled 2018-02-22: qty 5

## 2018-02-22 MED ORDER — DEXAMETHASONE SODIUM PHOSPHATE 10 MG/ML IJ SOLN
10.0000 mg | Freq: Once | INTRAMUSCULAR | Status: AC
  Administered 2018-02-22: 10 mg via INTRAVENOUS

## 2018-02-22 MED ORDER — OXYCODONE-ACETAMINOPHEN 5-325 MG PO TABS: 1.0000 | ORAL_TABLET | Freq: Four times a day (QID) | ORAL | 0 refills | Status: DC | PRN

## 2018-02-22 NOTE — Progress Notes (Signed)
Hematology and Oncology Follow Up Visit  Dennis Patel [id number] 1950/07/22 68 y.o. 02/22/2018 9:03 AM Dennis Patel, Dennis Patel, [name], L.[name], MD   Principle Diagnosis: 68 year old man with advanced prostate cancer with disease to the bone presented with a Gleason score 4+4 = 8 and a PSA of 48 in November 2019.  He has castration-sensitive disease at this point.   Prior Therapy:  Prostate biopsy obtained on December 05, 2017 showed a Gleason score of 4+4 = 8.  Current therapy:  Androgen deprivation therapy under the care of Dr. Junious Silk in the form of Mills Koller started in December 2019.  Taxotere chemotherapy started in January 2020.  He is receiving 75 mg/m and is here for cycle 2.  Interim History: [name] returns today for a repeat evaluation.  Since the last visit, reports no major changes in his health.  Continues to eat well and gained weight since her last visit.  He did lose weight few days after chemotherapy with decline in his appetite and changes in his taste but all of that has recovered.  He did report arthralgias and myalgias related to growth factor support and uses Percocet as needed.  His overall quality of life and performance status is unchanged.  Patient denied any alteration mental status, neuropathy, confusion or dizziness.  Denies any headaches or lethargy.  Denies any night sweats, weight loss or changes in appetite.  Denied orthopnea, dyspnea on exertion or chest discomfort.  Denies shortness of breath, difficulty breathing hemoptysis or cough.  Denies any abdominal distention, nausea, early satiety or dyspepsia.  Denies any hematuria, frequency, dysuria or nocturia.  Denies any skin irritation, dryness or rash.  Denies any ecchymosis or petechiae.  Denies any lymphadenopathy or clotting.  Denies any heat or cold intolerance.  Denies any anxiety or depression.  Remaining review of system is negative.    Medications: I have reviewed the patient's current  medications.  Current Outpatient Medications  Medication Sig Dispense Refill  . acetaminophen (TYLENOL) 325 MG tablet Take 650 mg by mouth every 6 (six) hours as needed.    Marland Kitchen amLODipine (NORVASC) 10 MG tablet Take 1 tablet (10 mg total) by mouth daily. 30 tablet 0  . aspirin EC 81 MG tablet Take 81 mg by mouth daily.    Marland Kitchen atorvastatin (LIPITOR) 10 MG tablet Take 1 tablet (10 mg total) by mouth daily. 30 tablet 11  . calcium-vitamin D (OSCAL WITH D) 250-125 MG-UNIT tablet Take 1 tablet by mouth daily.    . clopidogrel (PLAVIX) 75 MG tablet Take 1 tablet (75 mg total) by mouth daily. 30 tablet 11  . famotidine (PEPCID) 10 MG tablet Take 10 mg by mouth daily.    . finasteride (PROSCAR) 5 MG tablet Take 5 mg by mouth daily.    . hydrochlorothiazide (HYDRODIURIL) 12.5 MG tablet Take 12.5 mg by mouth daily.  12  . ibuprofen (ADVIL,MOTRIN) 200 MG tablet Take 200 mg by mouth every 6 (six) hours as needed for headache or moderate pain.     Marland Kitchen lidocaine-prilocaine (EMLA) cream Apply 1 application topically as needed. 30 g 0  . Multiple Vitamins-Minerals (ONE DAILY MULTIVITAMIN MEN PO) Take 1 tablet by mouth daily.    Marland Kitchen oxyCODONE-acetaminophen (PERCOCET) 5-325 MG tablet Take 1 tablet by mouth every 6 (six) hours as needed for severe pain (may cause constipation). 60 tablet 0  . predniSONE (DELTASONE) 20 MG tablet   0  . prochlorperazine (COMPAZINE) 10 MG tablet Take 1 tablet (10 mg total) by mouth  every 6 (six) hours as needed for nausea or vomiting. 30 tablet 0   No current facility-administered medications for this visit.      Allergies: No Known Allergies  Past Medical History, Surgical history, Social history, and Family History were reviewed and updated.   Physical Exam: Blood pressure 134/70, pulse 72, temperature 98.1 F (36.7 C), temperature source Oral, resp. rate 17, height 5\' 10"  (1.778 m), weight 232 lb 8 oz (105.5 kg), SpO2 99 %.   ECOG: 0    General appearance: Comfortable  appearing without any discomfort Head: Normocephalic without any trauma Oropharynx: Mucous membranes are moist and pink without any thrush or ulcers. Eyes: Pupils are equal and round reactive to light. Lymph nodes: No cervical, supraclavicular, inguinal or axillary lymphadenopathy.   Heart:regular rate and rhythm.  S1 and S2 without leg edema. Lung: Clear without any rhonchi or wheezes.  No dullness to percussion. Abdomin: Soft, nontender, nondistended with good bowel sounds.  No hepatosplenomegaly. Musculoskeletal: No joint deformity or effusion.  Full range of motion noted. Neurological: No deficits noted on motor, sensory and deep tendon reflex exam. Skin: No petechial rash or dryness.  Appeared moist.      Lab Results: Lab Results  Component Value Date   WBC 6.8 02/22/2018   HGB 12.1 (L) 02/22/2018   HCT 36.2 (L) 02/22/2018   MCV 89.4 02/22/2018   PLT 298 02/22/2018     Chemistry      Component Value Date/Time   NA 141 01/31/2018 1001   K 3.8 01/31/2018 1001   CL 106 01/31/2018 1001   CO2 26 01/31/2018 1001   BUN 14 01/31/2018 1001   CREATININE 0.86 01/31/2018 1001      Component Value Date/Time   CALCIUM 9.7 01/31/2018 1001   ALKPHOS 182 (H) 01/31/2018 1001   AST 30 01/31/2018 1001   ALT 51 (H) 01/31/2018 1001   BILITOT 0.6 01/31/2018 1001        Impression and Plan:   68 year old man with the following:  1.    Castration-sensitive advanced prostate cancer diagnosed in 2019 with bone disease.  He is currently receiving Taxotere chemotherapy with excellent tolerance after the first cycle of therapy.  He did report predictable toxicities including alteration of his taste and mild nausea but no other major complications.  Risks and benefits of continuing this therapy long-term was reviewed today is agreeable to continue.  He is plan to complete 6 cycles of therapy.  2.  Androgen deprivation therapy: He continues to receive that under the care of Dr. Junious Silk.   This will be continued indefinitely.   3.  Bone directed therapy: He would be a good candidate for Xofigo which I encouraged to get started in the near future.   4.  Bone pain: Manageable with Percocet which was refilled for him today.  5.  IV access: Port-A-Cath inserted and has been in use without complications.  6.  Antiemetics: Compazine has been effective in treating his nausea and which was refilled for him at this time.  7.  Neutropenia prophylaxis: This will be used after each cycle of chemotherapy given his high risk of neutropenia.  8.  Prognosis and goals of care: Aggressive therapy is warranted given his excellent performance status despite his disease being incurable.  9.  Follow-up: We will be in 3 weeks for the next cycle of chemotherapy.  25 minutes was spent with the patient face-to-face today.  More than 50% of time was dedicated to reviewing his  disease status, laboratory data and answering questions regarding plan of care and managing toxicity associated with therapy.   Zola Button, MD 2/6/20209:03 AM

## 2018-02-22 NOTE — Progress Notes (Signed)
Dennis Patel receiving his second  cycle of Taxotere today.  He states he did well with first cycle but he did experience some mild side effects. He had decreased appetite and mild nausea but has recovered well. I asked him to call  with questions or concerns. He voiced understanding.

## 2018-02-22 NOTE — Patient Instructions (Signed)
[facility] Discharge Instructions for Patients Receiving Chemotherapy  Today you received the following chemotherapy agents Taxotere To help prevent nausea and vomiting after your treatment, we encourage you to take your nausea medication as prescribed.   If you develop nausea and vomiting that is not controlled by your nausea medication, call the clinic.   BELOW ARE SYMPTOMS THAT SHOULD BE REPORTED IMMEDIATELY:  *FEVER GREATER THAN 100.5 F  *CHILLS WITH OR WITHOUT FEVER  NAUSEA AND VOMITING THAT IS NOT CONTROLLED WITH YOUR NAUSEA MEDICATION  *UNUSUAL SHORTNESS OF BREATH  *UNUSUAL BRUISING OR BLEEDING  TENDERNESS IN MOUTH AND THROAT WITH OR WITHOUT PRESENCE OF ULCERS  *URINARY PROBLEMS  *BOWEL PROBLEMS  UNUSUAL RASH Items with * indicate a potential emergency and should be followed up as soon as possible.  Feel free to call the clinic should you have any questions or concerns. The clinic phone number is [phone].  Please show the CHEMO ALERT CARD at check-in to the Emergency Department and triage nurse.   

## 2018-02-22 NOTE — Telephone Encounter (Signed)
Scheduled appt per 2/06 los. ° °Printed calendar and avs. °

## 2018-02-23 LAB — PROSTATE-SPECIFIC AG, SERUM (LABCORP): Prostate Specific Ag, Serum: 31 ng/mL — ABNORMAL HIGH (ref 0.0–4.0)

## 2018-02-24 ENCOUNTER — Inpatient Hospital Stay: Payer: Medicare Other

## 2018-02-24 VITALS — BP 140/76 | HR 70 | Temp 98.0°F | Resp 16

## 2018-02-24 DIAGNOSIS — Z7689 Persons encountering health services in other specified circumstances: Secondary | ICD-10-CM | POA: Diagnosis not present

## 2018-02-24 DIAGNOSIS — G4700 Insomnia, unspecified: Secondary | ICD-10-CM | POA: Diagnosis not present

## 2018-02-24 DIAGNOSIS — Z191 Hormone sensitive malignancy status: Secondary | ICD-10-CM | POA: Diagnosis not present

## 2018-02-24 DIAGNOSIS — M2550 Pain in unspecified joint: Secondary | ICD-10-CM | POA: Diagnosis not present

## 2018-02-24 DIAGNOSIS — C7951 Secondary malignant neoplasm of bone: Secondary | ICD-10-CM | POA: Diagnosis not present

## 2018-02-24 DIAGNOSIS — C61 Malignant neoplasm of prostate: Secondary | ICD-10-CM

## 2018-02-24 DIAGNOSIS — M791 Myalgia, unspecified site: Secondary | ICD-10-CM | POA: Diagnosis not present

## 2018-02-24 DIAGNOSIS — R110 Nausea: Secondary | ICD-10-CM | POA: Diagnosis not present

## 2018-02-24 DIAGNOSIS — R5383 Other fatigue: Secondary | ICD-10-CM | POA: Diagnosis not present

## 2018-02-24 DIAGNOSIS — Z5111 Encounter for antineoplastic chemotherapy: Secondary | ICD-10-CM | POA: Diagnosis not present

## 2018-02-24 DIAGNOSIS — Z7982 Long term (current) use of aspirin: Secondary | ICD-10-CM | POA: Diagnosis not present

## 2018-02-24 DIAGNOSIS — Z79899 Other long term (current) drug therapy: Secondary | ICD-10-CM | POA: Diagnosis not present

## 2018-02-24 DIAGNOSIS — G629 Polyneuropathy, unspecified: Secondary | ICD-10-CM | POA: Diagnosis not present

## 2018-02-24 MED ORDER — PEGFILGRASTIM-CBQV 6 MG/0.6ML ~~LOC~~ SOSY
6.0000 mg | PREFILLED_SYRINGE | Freq: Once | SUBCUTANEOUS | Status: AC
  Administered 2018-02-24: 6 mg via SUBCUTANEOUS

## 2018-02-24 MED ORDER — PEGFILGRASTIM-CBQV 6 MG/0.6ML ~~LOC~~ SOSY
PREFILLED_SYRINGE | SUBCUTANEOUS | Status: AC
  Filled 2018-02-24: qty 0.6

## 2018-02-24 NOTE — Patient Instructions (Signed)
Pegfilgrastim injection  What is this medicine?  PEGFILGRASTIM (PEG fil gra stim) is a long-acting granulocyte colony-stimulating factor that stimulates the growth of neutrophils, a type of white blood cell important in the body's fight against infection. It is used to reduce the incidence of fever and infection in patients with certain types of cancer who are receiving chemotherapy that affects the bone marrow, and to increase survival after being exposed to high doses of radiation.  This medicine may be used for other purposes; ask your health care provider or pharmacist if you have questions.  COMMON BRAND NAME(S): Fulphila, Neulasta, UDENYCA  What should I tell my health care provider before I take this medicine?  They need to know if you have any of these conditions:  -kidney disease  -latex allergy  -ongoing radiation therapy  -sickle cell disease  -skin reactions to acrylic adhesives (On-Body Injector only)  -an unusual or allergic reaction to pegfilgrastim, filgrastim, other medicines, foods, dyes, or preservatives  -pregnant or trying to get pregnant  -breast-feeding  How should I use this medicine?  This medicine is for injection under the skin. If you get this medicine at home, you will be taught how to prepare and give the pre-filled syringe or how to use the On-body Injector. Refer to the patient Instructions for Use for detailed instructions. Use exactly as directed. Tell your healthcare provider immediately if you suspect that the On-body Injector may not have performed as intended or if you suspect the use of the On-body Injector resulted in a missed or partial dose.  It is important that you put your used needles and syringes in a special sharps container. Do not put them in a trash can. If you do not have a sharps container, call your pharmacist or healthcare provider to get one.  Talk to your pediatrician regarding the use of this medicine in children. While this drug may be prescribed for  selected conditions, precautions do apply.  Overdosage: If you think you have taken too much of this medicine contact a poison control center or emergency room at once.  NOTE: This medicine is only for you. Do not share this medicine with others.  What if I miss a dose?  It is important not to miss your dose. Call your doctor or health care professional if you miss your dose. If you miss a dose due to an On-body Injector failure or leakage, a new dose should be administered as soon as possible using a single prefilled syringe for manual use.  What may interact with this medicine?  Interactions have not been studied.  Give your health care provider a list of all the medicines, herbs, non-prescription drugs, or dietary supplements you use. Also tell them if you smoke, drink alcohol, or use illegal drugs. Some items may interact with your medicine.  This list may not describe all possible interactions. Give your health care provider a list of all the medicines, herbs, non-prescription drugs, or dietary supplements you use. Also tell them if you smoke, drink alcohol, or use illegal drugs. Some items may interact with your medicine.  What should I watch for while using this medicine?  You may need blood work done while you are taking this medicine.  If you are going to need a MRI, CT scan, or other procedure, tell your doctor that you are using this medicine (On-Body Injector only).  What side effects may I notice from receiving this medicine?  Side effects that you should report to   your doctor or health care professional as soon as possible:  -allergic reactions like skin rash, itching or hives, swelling of the face, lips, or tongue  -back pain  -dizziness  -fever  -pain, redness, or irritation at site where injected  -pinpoint red spots on the skin  -red or dark-brown urine  -shortness of breath or breathing problems  -stomach or side pain, or pain at the shoulder  -swelling  -tiredness  -trouble passing urine or  change in the amount of urine  Side effects that usually do not require medical attention (report to your doctor or health care professional if they continue or are bothersome):  -bone pain  -muscle pain  This list may not describe all possible side effects. Call your doctor for medical advice about side effects. You may report side effects to FDA at 1-800-FDA-1088.  Where should I keep my medicine?  Keep out of the reach of children.  If you are using this medicine at home, you will be instructed on how to store it. Throw away any unused medicine after the expiration date on the label.  NOTE: This sheet is a summary. It may not cover all possible information. If you have questions about this medicine, talk to your doctor, pharmacist, or health care provider.   2019 Elsevier/Gold Standard (2017-04-10 16:57:08)

## 2018-02-27 ENCOUNTER — Telehealth: Payer: Self-pay | Admitting: *Deleted

## 2018-02-27 NOTE — Telephone Encounter (Signed)
-----   Message from Wyatt Portela, MD sent at 02/27/2018  3:28 PM EST ----- He can use melatonin otc. No Rx.   ----- Message ----- From: Randolm Idol, RN Sent: 02/27/2018   3:12 PM EST To: Wyatt Portela, MD  Patient calling to say he is having trouble sleeping. Has tried staying up late, not helping. Has tried tylenol PM and only dozes, then is awake again. Is there anything that dr Alen Blew could prescribe for insomnia? Dennis Patel

## 2018-02-27 NOTE — Telephone Encounter (Signed)
Per dr Alen Blew, for insomnia, try melatonin. OTC. Patient notified.

## 2018-03-15 ENCOUNTER — Telehealth: Payer: Self-pay | Admitting: Oncology

## 2018-03-15 ENCOUNTER — Inpatient Hospital Stay: Payer: Medicare Other

## 2018-03-15 ENCOUNTER — Inpatient Hospital Stay (HOSPITAL_BASED_OUTPATIENT_CLINIC_OR_DEPARTMENT_OTHER): Payer: Medicare Other | Admitting: Oncology

## 2018-03-15 VITALS — BP 135/71 | HR 73 | Temp 98.5°F | Resp 18 | Ht 70.0 in | Wt 232.6 lb

## 2018-03-15 DIAGNOSIS — M2550 Pain in unspecified joint: Secondary | ICD-10-CM

## 2018-03-15 DIAGNOSIS — C61 Malignant neoplasm of prostate: Secondary | ICD-10-CM | POA: Diagnosis not present

## 2018-03-15 DIAGNOSIS — Z7982 Long term (current) use of aspirin: Secondary | ICD-10-CM

## 2018-03-15 DIAGNOSIS — Z79899 Other long term (current) drug therapy: Secondary | ICD-10-CM

## 2018-03-15 DIAGNOSIS — R5383 Other fatigue: Secondary | ICD-10-CM | POA: Diagnosis not present

## 2018-03-15 DIAGNOSIS — G4700 Insomnia, unspecified: Secondary | ICD-10-CM

## 2018-03-15 DIAGNOSIS — Z191 Hormone sensitive malignancy status: Secondary | ICD-10-CM

## 2018-03-15 DIAGNOSIS — Z5111 Encounter for antineoplastic chemotherapy: Secondary | ICD-10-CM

## 2018-03-15 DIAGNOSIS — Z7689 Persons encountering health services in other specified circumstances: Secondary | ICD-10-CM

## 2018-03-15 DIAGNOSIS — Z95828 Presence of other vascular implants and grafts: Secondary | ICD-10-CM

## 2018-03-15 DIAGNOSIS — M791 Myalgia, unspecified site: Secondary | ICD-10-CM | POA: Diagnosis not present

## 2018-03-15 DIAGNOSIS — C7951 Secondary malignant neoplasm of bone: Secondary | ICD-10-CM

## 2018-03-15 DIAGNOSIS — G629 Polyneuropathy, unspecified: Secondary | ICD-10-CM

## 2018-03-15 DIAGNOSIS — R110 Nausea: Secondary | ICD-10-CM

## 2018-03-15 LAB — CBC WITH DIFFERENTIAL (CANCER CENTER ONLY)
Abs Immature Granulocytes: 0.07 10*3/uL (ref 0.00–0.07)
Basophils Absolute: 0.1 10*3/uL (ref 0.0–0.1)
Basophils Relative: 1 %
Eosinophils Absolute: 0 10*3/uL (ref 0.0–0.5)
Eosinophils Relative: 0 %
HCT: 33.7 % — ABNORMAL LOW (ref 39.0–52.0)
Hemoglobin: 10.9 g/dL — ABNORMAL LOW (ref 13.0–17.0)
IMMATURE GRANULOCYTES: 1 %
LYMPHS ABS: 1.4 10*3/uL (ref 0.7–4.0)
Lymphocytes Relative: 18 %
MCH: 30.3 pg (ref 26.0–34.0)
MCHC: 32.3 g/dL (ref 30.0–36.0)
MCV: 93.6 fL (ref 80.0–100.0)
Monocytes Absolute: 1.1 10*3/uL — ABNORMAL HIGH (ref 0.1–1.0)
Monocytes Relative: 14 %
Neutro Abs: 5.2 10*3/uL (ref 1.7–7.7)
Neutrophils Relative %: 66 %
Platelet Count: 210 10*3/uL (ref 150–400)
RBC: 3.6 MIL/uL — ABNORMAL LOW (ref 4.22–5.81)
RDW: 17.4 % — ABNORMAL HIGH (ref 11.5–15.5)
WBC Count: 7.8 10*3/uL (ref 4.0–10.5)
nRBC: 0.3 % — ABNORMAL HIGH (ref 0.0–0.2)

## 2018-03-15 LAB — CMP (CANCER CENTER ONLY)
ALBUMIN: 3.6 g/dL (ref 3.5–5.0)
ALT: 30 U/L (ref 0–44)
AST: 22 U/L (ref 15–41)
Alkaline Phosphatase: 92 U/L (ref 38–126)
Anion gap: 9 (ref 5–15)
BUN: 19 mg/dL (ref 8–23)
CO2: 24 mmol/L (ref 22–32)
CREATININE: 0.99 mg/dL (ref 0.61–1.24)
Calcium: 9 mg/dL (ref 8.9–10.3)
Chloride: 107 mmol/L (ref 98–111)
GFR, Est AFR Am: >60 mL/min (ref >60)
GFR, Estimated: >60 mL/min (ref >60)
Glucose, Bld: 189 mg/dL — ABNORMAL HIGH (ref 70–99)
Potassium: 3.7 mmol/L (ref 3.5–5.1)
Sodium: 140 mmol/L (ref 135–145)
TOTAL PROTEIN: 6 g/dL — AB (ref 6.5–8.1)
Total Bilirubin: 0.6 mg/dL (ref 0.3–1.2)

## 2018-03-15 MED ORDER — SODIUM CHLORIDE 0.9% FLUSH
10.0000 mL | INTRAVENOUS | Status: DC | PRN
  Administered 2018-03-15: 10 mL
  Filled 2018-03-15: qty 10

## 2018-03-15 MED ORDER — DEXAMETHASONE SODIUM PHOSPHATE 10 MG/ML IJ SOLN
10.0000 mg | Freq: Once | INTRAMUSCULAR | Status: AC
  Administered 2018-03-15: 10 mg via INTRAVENOUS

## 2018-03-15 MED ORDER — SODIUM CHLORIDE 0.9 % IV SOLN
75.0000 mg/m2 | Freq: Once | INTRAVENOUS | Status: AC
  Administered 2018-03-15: 170 mg via INTRAVENOUS
  Filled 2018-03-15: qty 17

## 2018-03-15 MED ORDER — GABAPENTIN 300 MG PO CAPS: 300.0000 mg | ORAL_CAPSULE | Freq: Every day | ORAL | 1 refills | Status: DC

## 2018-03-15 MED ORDER — DEXAMETHASONE SODIUM PHOSPHATE 10 MG/ML IJ SOLN
INTRAMUSCULAR | Status: AC
  Filled 2018-03-15: qty 1

## 2018-03-15 MED ORDER — HEPARIN SOD (PORK) LOCK FLUSH 100 UNIT/ML IV SOLN
500.0000 [IU] | Freq: Once | INTRAVENOUS | Status: AC | PRN
  Administered 2018-03-15: 500 [IU]
  Filled 2018-03-15: qty 5

## 2018-03-15 MED ORDER — SODIUM CHLORIDE 0.9 % IV SOLN
Freq: Once | INTRAVENOUS | Status: AC
  Administered 2018-03-15: 09:00:00 via INTRAVENOUS
  Filled 2018-03-15: qty 250

## 2018-03-15 MED ORDER — PROMETHAZINE HCL 25 MG PO TABS: 25.0000 mg | ORAL_TABLET | Freq: Four times a day (QID) | ORAL | 0 refills | Status: DC | PRN

## 2018-03-15 NOTE — Progress Notes (Signed)
Hematology and Oncology Follow Up Visit  Dennis Patel [id number] 07-May-1950 68 y.o. 03/15/2018 8:15 AM [name], [name], [name], [name], MD   Principle Diagnosis: 68 year old man with castration-sensitive prostate cancer with disease to the bone diagnosed in November 2019.  He presented with a Gleason score 4+4 = 8 and a PSA of 48 in November 2019.     Prior Therapy:  Prostate biopsy obtained on December 05, 2017 showed a Gleason score of 4+4 = 8.  Current therapy:  Androgen deprivation therapy under the care of Dr. Junious Silk in the form of Mills Koller started in December 2019.  Taxotere 75 mg/m every 3 weeks started on January 31, 2018.  He is here for cycle 3.  Interim History: Dennis Patel is here for a follow-up evaluation.  Since the last visit, he received the last cycle of chemotherapy without any major complications.  He did report neuropathy that lasted for a few days in his hands and responded to Neurontin.  He also had reported some mild insomnia and nausea although reported no vomiting.  He has taken Compazine which has exacerbated and nausea.  His appetite remain excellent performance status is unchanged.  He does report some fatigue and tiredness associated with this therapy.  Patient denied headaches, blurry vision, syncope or seizures.  Denies any fevers, chills or sweats.  Denied chest pain, palpitation, orthopnea or leg edema.  Denied cough, wheezing or hemoptysis.  Denied nausea, vomiting or abdominal pain.  Denies any constipation or diarrhea.  Denies any frequency urgency or hesitancy.  Denies any arthralgias or myalgias.  Denies any skin rashes or lesions.  Denies any bleeding or clotting tendency.  Denies any easy bruising.  Denies any hair or nail changes.  Denies any anxiety or depression.  Remaining review of system is negative.     Medications: I have reviewed the patient's current medications.  Current Outpatient Medications  Medication Sig Dispense Refill   . acetaminophen (TYLENOL) 325 MG tablet Take 650 mg by mouth every 6 (six) hours as needed.    Marland Kitchen amLODipine (NORVASC) 10 MG tablet Take 1 tablet (10 mg total) by mouth daily. 30 tablet 0  . aspirin EC 81 MG tablet Take 81 mg by mouth daily.    Marland Kitchen atorvastatin (LIPITOR) 10 MG tablet Take 1 tablet (10 mg total) by mouth daily. 30 tablet 11  . calcium-vitamin D (OSCAL WITH D) 250-125 MG-UNIT tablet Take 1 tablet by mouth daily.    . clopidogrel (PLAVIX) 75 MG tablet Take 1 tablet (75 mg total) by mouth daily. 30 tablet 11  . famotidine (PEPCID) 10 MG tablet Take 10 mg by mouth daily.    . finasteride (PROSCAR) 5 MG tablet Take 5 mg by mouth daily.    . hydrochlorothiazide (HYDRODIURIL) 12.5 MG tablet Take 12.5 mg by mouth daily.  12  . ibuprofen (ADVIL,MOTRIN) 200 MG tablet Take 200 mg by mouth every 6 (six) hours as needed for headache or moderate pain.     Marland Kitchen lidocaine-prilocaine (EMLA) cream Apply 1 application topically as needed. 30 g 0  . Multiple Vitamins-Minerals (ONE DAILY MULTIVITAMIN MEN PO) Take 1 tablet by mouth daily.    Marland Kitchen oxyCODONE-acetaminophen (PERCOCET) 5-325 MG tablet Take 1 tablet by mouth every 6 (six) hours as needed for severe pain (may cause constipation). 60 tablet 0  . predniSONE (DELTASONE) 20 MG tablet   0  . prochlorperazine (COMPAZINE) 10 MG tablet Take 1 tablet (10 mg total) by mouth every 6 (six) hours as needed  for nausea or vomiting. 90 tablet 0   No current facility-administered medications for this visit.    Facility-Administered Medications Ordered in Other Visits  Medication Dose Route Frequency Provider Last Rate Last Dose  . sodium chloride flush (NS) 0.9 % injection 10 mL  10 mL Intracatheter PRN Wyatt Portela, MD   10 mL at 03/15/18 0998     Allergies: No Known Allergies  Past Medical History, Surgical history, Social history, and Family History were reviewed and updated.   Physical Exam:  Blood pressure 135/71, pulse 73, temperature 98.5 F  (36.9 C), temperature source Oral, resp. rate 18, height 5\' 10"  (1.778 m), weight 232 lb 9.6 oz (105.5 kg), SpO2 99 %.   ECOG: 0   General appearance: Alert, awake without any distress. Head: Atraumatic without abnormalities Oropharynx: Without any thrush or ulcers. Eyes: No scleral icterus. Lymph nodes: No lymphadenopathy noted in the cervical, supraclavicular, or axillary nodes Heart:regular rate and rhythm, without any murmurs or gallops.   Lung: Clear to auscultation without any rhonchi, wheezes or dullness to percussion. Abdomin: Soft, nontender without any shifting dullness or ascites. Musculoskeletal: No clubbing or cyanosis. Neurological: No motor or sensory deficits. Skin: No rashes or lesions. Psychiatric: Mood and affect appeared normal.       Lab Results: Lab Results  Component Value Date   WBC 7.8 03/15/2018   HGB 10.9 (L) 03/15/2018   HCT 33.7 (L) 03/15/2018   MCV 93.6 03/15/2018   PLT 210 03/15/2018     Chemistry      Component Value Date/Time   NA 141 02/22/2018 0828   K 4.1 02/22/2018 0828   CL 108 02/22/2018 0828   CO2 24 02/22/2018 0828   BUN 16 02/22/2018 0828   CREATININE 0.99 02/22/2018 0828      Component Value Date/Time   CALCIUM 9.0 02/22/2018 0828   ALKPHOS 107 02/22/2018 0828   AST 29 02/22/2018 0828   ALT 46 (H) 02/22/2018 0828   BILITOT 0.6 02/22/2018 0828      Results for Dennis Patel, Dennis Patel (MRN [id number]) as of 03/15/2018 08:15  Ref. Range 01/31/2018 10:01 02/22/2018 08:28  Prostate Specific Ag, Serum Latest Ref Range: 0.0 - 4.0 ng/mL 26.5 (H) 31.0 (H)    Impression and Plan:   68 year old man with:  1.    Advanced prostate cancer with disease to the bone diagnosed in November 2019.  He has castration-sensitive disease at the time of diagnosis.  He is currently on Taxotere chemotherapy and has tolerated the first 2 cycles without any major issues.  Risks and benefits of continuing this therapy to complete 6 cycles were  reviewed.  These side effects include nausea, fatigue, myelosuppression, neuropathy and edema.  Alternative therapies were also reviewed including Zytiga, Xtandi among others.  These will be deferred he developed castration resistant disease.  He is agreeable to proceed with treatment today and the plan is to complete 6 cycles of therapy.  We will continue to monitor his PSA at this time.  2.  Androgen deprivation therapy: I recommended continuing this indefinitely.  He is currently receiving that under the care of Dr. Junious Silk.   3.  Bone directed therapy: I recommended calcium and vitamin D supplements and obtaining dental clearance prior to starting Xgeva.   4.  Bone pain: Manageable at this time and overall improved.  5.  IV access: Port-A-Cath remains in use without any issues.  6.  Antiemetics: Mild nausea reported at this time with Compazine has been  ineffective.  I will prescribe Phenergan for him instead.  7.  Neutropenia prophylaxis: This will be utilized after each cycle of therapy.  He is at risk of developing neutropenia and none reported at this time.  8.  Prognosis and goals of care: His disease remains incurable but aggressive therapy is warranted.  9.  Insomnia: Prescription for trazodone will be made available to him.  10.  Neuropathy: Prescription for Neurontin 300 mg to take if he develops worsening neuropathy after each cycle.  11.  Follow-up: We will be in 3 weeks for the next cycle of therapy.  25 minutes was spent with the patient face-to-face today.  More than 50% of time was dedicated to discussing the natural course of his disease, treatment options and managing toxicities related to therapy.   Zola Button, MD 2/27/20208:15 AM

## 2018-03-15 NOTE — Telephone Encounter (Signed)
Scheduled appt per 2/27 los. ° °Printed calendar and avs. °

## 2018-03-15 NOTE — Patient Instructions (Signed)
[facility] Discharge Instructions for Patients Receiving Chemotherapy  Today you received the following chemotherapy agents: Taxotere  To help prevent nausea and vomiting after your treatment, we encourage you to take your nausea medication as directed.    If you develop nausea and vomiting that is not controlled by your nausea medication, call the clinic.   BELOW ARE SYMPTOMS THAT SHOULD BE REPORTED IMMEDIATELY:  *FEVER GREATER THAN 100.5 F  *CHILLS WITH OR WITHOUT FEVER  NAUSEA AND VOMITING THAT IS NOT CONTROLLED WITH YOUR NAUSEA MEDICATION  *UNUSUAL SHORTNESS OF BREATH  *UNUSUAL BRUISING OR BLEEDING  TENDERNESS IN MOUTH AND THROAT WITH OR WITHOUT PRESENCE OF ULCERS  *URINARY PROBLEMS  *BOWEL PROBLEMS  UNUSUAL RASH Items with * indicate a potential emergency and should be followed up as soon as possible.  Feel free to call the clinic should you have any questions or concerns. The clinic phone number is [phone].  Please show the CHEMO ALERT CARD at check-in to the Emergency Department and triage nurse.   

## 2018-03-16 ENCOUNTER — Other Ambulatory Visit: Payer: Self-pay | Admitting: *Deleted

## 2018-03-16 ENCOUNTER — Telehealth: Payer: Self-pay | Admitting: *Deleted

## 2018-03-16 LAB — PROSTATE-SPECIFIC AG, SERUM (LABCORP): Prostate Specific Ag, Serum: 10.9 ng/mL — ABNORMAL HIGH (ref 0.0–4.0)

## 2018-03-16 MED ORDER — TRAZODONE HCL 50 MG PO TABS: 50.0000 mg | ORAL_TABLET | Freq: Every day | ORAL | 1 refills | Status: DC

## 2018-03-16 NOTE — Telephone Encounter (Signed)
-----   Message from Wyatt Portela, MD sent at 03/16/2018  8:04 AM EST ----- Please let him know his PSA is down.

## 2018-03-16 NOTE — Telephone Encounter (Signed)
Notified of message below

## 2018-03-17 ENCOUNTER — Inpatient Hospital Stay: Payer: Medicare Other

## 2018-03-17 VITALS — BP 124/70 | HR 82 | Temp 98.6°F | Resp 20

## 2018-03-17 DIAGNOSIS — M791 Myalgia, unspecified site: Secondary | ICD-10-CM | POA: Diagnosis not present

## 2018-03-17 DIAGNOSIS — Z7982 Long term (current) use of aspirin: Secondary | ICD-10-CM | POA: Diagnosis not present

## 2018-03-17 DIAGNOSIS — Z191 Hormone sensitive malignancy status: Secondary | ICD-10-CM | POA: Diagnosis not present

## 2018-03-17 DIAGNOSIS — G629 Polyneuropathy, unspecified: Secondary | ICD-10-CM | POA: Diagnosis not present

## 2018-03-17 DIAGNOSIS — G4700 Insomnia, unspecified: Secondary | ICD-10-CM | POA: Diagnosis not present

## 2018-03-17 DIAGNOSIS — Z7689 Persons encountering health services in other specified circumstances: Secondary | ICD-10-CM | POA: Diagnosis not present

## 2018-03-17 DIAGNOSIS — C7951 Secondary malignant neoplasm of bone: Secondary | ICD-10-CM | POA: Diagnosis not present

## 2018-03-17 DIAGNOSIS — Z79899 Other long term (current) drug therapy: Secondary | ICD-10-CM | POA: Diagnosis not present

## 2018-03-17 DIAGNOSIS — M2550 Pain in unspecified joint: Secondary | ICD-10-CM | POA: Diagnosis not present

## 2018-03-17 DIAGNOSIS — Z5111 Encounter for antineoplastic chemotherapy: Secondary | ICD-10-CM | POA: Diagnosis not present

## 2018-03-17 DIAGNOSIS — C61 Malignant neoplasm of prostate: Secondary | ICD-10-CM | POA: Diagnosis not present

## 2018-03-17 DIAGNOSIS — R5383 Other fatigue: Secondary | ICD-10-CM | POA: Diagnosis not present

## 2018-03-17 DIAGNOSIS — R110 Nausea: Secondary | ICD-10-CM | POA: Diagnosis not present

## 2018-03-17 MED ORDER — PEGFILGRASTIM-CBQV 6 MG/0.6ML ~~LOC~~ SOSY
PREFILLED_SYRINGE | SUBCUTANEOUS | Status: AC
  Filled 2018-03-17: qty 0.6

## 2018-03-17 MED ORDER — PEGFILGRASTIM-CBQV 6 MG/0.6ML ~~LOC~~ SOSY
6.0000 mg | PREFILLED_SYRINGE | Freq: Once | SUBCUTANEOUS | Status: AC
  Administered 2018-03-17: 6 mg via SUBCUTANEOUS

## 2018-03-17 NOTE — Patient Instructions (Signed)
Pegfilgrastim injection  What is this medicine?  PEGFILGRASTIM (PEG fil gra stim) is a long-acting granulocyte colony-stimulating factor that stimulates the growth of neutrophils, a type of white blood cell important in the body's fight against infection. It is used to reduce the incidence of fever and infection in patients with certain types of cancer who are receiving chemotherapy that affects the bone marrow, and to increase survival after being exposed to high doses of radiation.  This medicine may be used for other purposes; ask your health care provider or pharmacist if you have questions.  COMMON BRAND NAME(S): Fulphila, Neulasta, UDENYCA  What should I tell my health care provider before I take this medicine?  They need to know if you have any of these conditions:  -kidney disease  -latex allergy  -ongoing radiation therapy  -sickle cell disease  -skin reactions to acrylic adhesives (On-Body Injector only)  -an unusual or allergic reaction to pegfilgrastim, filgrastim, other medicines, foods, dyes, or preservatives  -pregnant or trying to get pregnant  -breast-feeding  How should I use this medicine?  This medicine is for injection under the skin. If you get this medicine at home, you will be taught how to prepare and give the pre-filled syringe or how to use the On-body Injector. Refer to the patient Instructions for Use for detailed instructions. Use exactly as directed. Tell your healthcare provider immediately if you suspect that the On-body Injector may not have performed as intended or if you suspect the use of the On-body Injector resulted in a missed or partial dose.  It is important that you put your used needles and syringes in a special sharps container. Do not put them in a trash can. If you do not have a sharps container, call your pharmacist or healthcare provider to get one.  Talk to your pediatrician regarding the use of this medicine in children. While this drug may be prescribed for  selected conditions, precautions do apply.  Overdosage: If you think you have taken too much of this medicine contact a poison control center or emergency room at once.  NOTE: This medicine is only for you. Do not share this medicine with others.  What if I miss a dose?  It is important not to miss your dose. Call your doctor or health care professional if you miss your dose. If you miss a dose due to an On-body Injector failure or leakage, a new dose should be administered as soon as possible using a single prefilled syringe for manual use.  What may interact with this medicine?  Interactions have not been studied.  Give your health care provider a list of all the medicines, herbs, non-prescription drugs, or dietary supplements you use. Also tell them if you smoke, drink alcohol, or use illegal drugs. Some items may interact with your medicine.  This list may not describe all possible interactions. Give your health care provider a list of all the medicines, herbs, non-prescription drugs, or dietary supplements you use. Also tell them if you smoke, drink alcohol, or use illegal drugs. Some items may interact with your medicine.  What should I watch for while using this medicine?  You may need blood work done while you are taking this medicine.  If you are going to need a MRI, CT scan, or other procedure, tell your doctor that you are using this medicine (On-Body Injector only).  What side effects may I notice from receiving this medicine?  Side effects that you should report to   your doctor or health care professional as soon as possible:  -allergic reactions like skin rash, itching or hives, swelling of the face, lips, or tongue  -back pain  -dizziness  -fever  -pain, redness, or irritation at site where injected  -pinpoint red spots on the skin  -red or dark-brown urine  -shortness of breath or breathing problems  -stomach or side pain, or pain at the shoulder  -swelling  -tiredness  -trouble passing urine or  change in the amount of urine  Side effects that usually do not require medical attention (report to your doctor or health care professional if they continue or are bothersome):  -bone pain  -muscle pain  This list may not describe all possible side effects. Call your doctor for medical advice about side effects. You may report side effects to FDA at 1-800-FDA-1088.  Where should I keep my medicine?  Keep out of the reach of children.  If you are using this medicine at home, you will be instructed on how to store it. Throw away any unused medicine after the expiration date on the label.  NOTE: This sheet is a summary. It may not cover all possible information. If you have questions about this medicine, talk to your doctor, pharmacist, or health care provider.   2019 Elsevier/Gold Standard (2017-04-10 16:57:08)

## 2018-03-19 ENCOUNTER — Telehealth: Payer: Self-pay | Admitting: Medical Oncology

## 2018-03-19 NOTE — Telephone Encounter (Signed)
Returned call to patient regarding trazadone Dr. Alen Blew e-scribed and that was not available when he went to pick up. He informed me that he spoke with Dr. Hazeline Junker nurse and it has been taken care of.

## 2018-03-26 DIAGNOSIS — Z85828 Personal history of other malignant neoplasm of skin: Secondary | ICD-10-CM | POA: Diagnosis not present

## 2018-03-26 DIAGNOSIS — L309 Dermatitis, unspecified: Secondary | ICD-10-CM | POA: Diagnosis not present

## 2018-03-26 DIAGNOSIS — Z85820 Personal history of malignant melanoma of skin: Secondary | ICD-10-CM | POA: Diagnosis not present

## 2018-03-26 DIAGNOSIS — L821 Other seborrheic keratosis: Secondary | ICD-10-CM | POA: Diagnosis not present

## 2018-04-05 ENCOUNTER — Other Ambulatory Visit: Payer: Self-pay

## 2018-04-05 ENCOUNTER — Inpatient Hospital Stay (HOSPITAL_BASED_OUTPATIENT_CLINIC_OR_DEPARTMENT_OTHER): Payer: Medicare Other | Admitting: Oncology

## 2018-04-05 ENCOUNTER — Inpatient Hospital Stay: Payer: Medicare Other

## 2018-04-05 ENCOUNTER — Inpatient Hospital Stay: Payer: Medicare Other | Attending: Oncology

## 2018-04-05 ENCOUNTER — Telehealth: Payer: Self-pay | Admitting: Oncology

## 2018-04-05 VITALS — BP 121/67 | HR 81 | Temp 98.8°F | Resp 18 | Ht 70.0 in | Wt 237.5 lb

## 2018-04-05 DIAGNOSIS — R5383 Other fatigue: Secondary | ICD-10-CM | POA: Insufficient documentation

## 2018-04-05 DIAGNOSIS — Z79899 Other long term (current) drug therapy: Secondary | ICD-10-CM | POA: Insufficient documentation

## 2018-04-05 DIAGNOSIS — Z7689 Persons encountering health services in other specified circumstances: Secondary | ICD-10-CM | POA: Insufficient documentation

## 2018-04-05 DIAGNOSIS — C61 Malignant neoplasm of prostate: Secondary | ICD-10-CM

## 2018-04-05 DIAGNOSIS — Z5111 Encounter for antineoplastic chemotherapy: Secondary | ICD-10-CM | POA: Diagnosis not present

## 2018-04-05 DIAGNOSIS — Z7982 Long term (current) use of aspirin: Secondary | ICD-10-CM | POA: Diagnosis not present

## 2018-04-05 DIAGNOSIS — G4700 Insomnia, unspecified: Secondary | ICD-10-CM | POA: Insufficient documentation

## 2018-04-05 DIAGNOSIS — Z9221 Personal history of antineoplastic chemotherapy: Secondary | ICD-10-CM | POA: Insufficient documentation

## 2018-04-05 DIAGNOSIS — Z95828 Presence of other vascular implants and grafts: Secondary | ICD-10-CM

## 2018-04-05 LAB — CMP (CANCER CENTER ONLY)
ALBUMIN: 3.4 g/dL — AB (ref 3.5–5.0)
ALT: 27 U/L (ref 0–44)
AST: 19 U/L (ref 15–41)
Alkaline Phosphatase: 79 U/L (ref 38–126)
Anion gap: 10 (ref 5–15)
BUN: 17 mg/dL (ref 8–23)
CO2: 23 mmol/L (ref 22–32)
Calcium: 8.3 mg/dL — ABNORMAL LOW (ref 8.9–10.3)
Chloride: 108 mmol/L (ref 98–111)
Creatinine: 0.97 mg/dL (ref 0.61–1.24)
GFR, Est AFR Am: >60 mL/min (ref >60)
GFR, Estimated: >60 mL/min (ref >60)
GLUCOSE: 167 mg/dL — AB (ref 70–99)
Potassium: 4 mmol/L (ref 3.5–5.1)
Sodium: 141 mmol/L (ref 135–145)
Total Bilirubin: 0.7 mg/dL (ref 0.3–1.2)
Total Protein: 5.8 g/dL — ABNORMAL LOW (ref 6.5–8.1)

## 2018-04-05 LAB — CBC WITH DIFFERENTIAL (CANCER CENTER ONLY)
Abs Immature Granulocytes: 0.1 10*3/uL — ABNORMAL HIGH (ref 0.00–0.07)
Basophils Absolute: 0.1 10*3/uL (ref 0.0–0.1)
Basophils Relative: 1 %
EOS PCT: 0 %
Eosinophils Absolute: 0 10*3/uL (ref 0.0–0.5)
HEMATOCRIT: 33.7 % — AB (ref 39.0–52.0)
Hemoglobin: 10.8 g/dL — ABNORMAL LOW (ref 13.0–17.0)
Immature Granulocytes: 1 %
Lymphocytes Relative: 15 %
Lymphs Abs: 1.1 10*3/uL (ref 0.7–4.0)
MCH: 31.6 pg (ref 26.0–34.0)
MCHC: 32 g/dL (ref 30.0–36.0)
MCV: 98.5 fL (ref 80.0–100.0)
Monocytes Absolute: 1.2 10*3/uL — ABNORMAL HIGH (ref 0.1–1.0)
Monocytes Relative: 16 %
Neutro Abs: 5.1 10*3/uL (ref 1.7–7.7)
Neutrophils Relative %: 67 %
Platelet Count: 213 10*3/uL (ref 150–400)
RBC: 3.42 MIL/uL — ABNORMAL LOW (ref 4.22–5.81)
RDW: 19.7 % — ABNORMAL HIGH (ref 11.5–15.5)
WBC Count: 7.5 10*3/uL (ref 4.0–10.5)
nRBC: 0 % (ref 0.0–0.2)

## 2018-04-05 MED ORDER — SODIUM CHLORIDE 0.9 % IV SOLN
Freq: Once | INTRAVENOUS | Status: AC
  Administered 2018-04-05: 10:00:00 via INTRAVENOUS
  Filled 2018-04-05: qty 250

## 2018-04-05 MED ORDER — HEPARIN SOD (PORK) LOCK FLUSH 100 UNIT/ML IV SOLN
500.0000 [IU] | Freq: Once | INTRAVENOUS | Status: AC | PRN
  Administered 2018-04-05: 500 [IU]
  Filled 2018-04-05: qty 5

## 2018-04-05 MED ORDER — SODIUM CHLORIDE 0.9% FLUSH
10.0000 mL | INTRAVENOUS | Status: DC | PRN
  Administered 2018-04-05: 10 mL
  Filled 2018-04-05: qty 10

## 2018-04-05 MED ORDER — DEXAMETHASONE SODIUM PHOSPHATE 10 MG/ML IJ SOLN
10.0000 mg | Freq: Once | INTRAMUSCULAR | Status: AC
  Administered 2018-04-05: 10 mg via INTRAVENOUS

## 2018-04-05 MED ORDER — SODIUM CHLORIDE 0.9 % IV SOLN
75.0000 mg/m2 | Freq: Once | INTRAVENOUS | Status: AC
  Administered 2018-04-05: 170 mg via INTRAVENOUS
  Filled 2018-04-05: qty 17

## 2018-04-05 MED ORDER — DEXAMETHASONE SODIUM PHOSPHATE 10 MG/ML IJ SOLN
INTRAMUSCULAR | Status: AC
  Filled 2018-04-05: qty 1

## 2018-04-05 NOTE — Patient Instructions (Signed)
[facility] Discharge Instructions for Patients Receiving Chemotherapy  Today you received the following chemotherapy agents: Taxotere  To help prevent nausea and vomiting after your treatment, we encourage you to take your nausea medication as directed.    If you develop nausea and vomiting that is not controlled by your nausea medication, call the clinic.   BELOW ARE SYMPTOMS THAT SHOULD BE REPORTED IMMEDIATELY:  *FEVER GREATER THAN 100.5 F  *CHILLS WITH OR WITHOUT FEVER  NAUSEA AND VOMITING THAT IS NOT CONTROLLED WITH YOUR NAUSEA MEDICATION  *UNUSUAL SHORTNESS OF BREATH  *UNUSUAL BRUISING OR BLEEDING  TENDERNESS IN MOUTH AND THROAT WITH OR WITHOUT PRESENCE OF ULCERS  *URINARY PROBLEMS  *BOWEL PROBLEMS  UNUSUAL RASH Items with * indicate a potential emergency and should be followed up as soon as possible.  Feel free to call the clinic should you have any questions or concerns. The clinic phone number is [phone].  Please show the CHEMO ALERT CARD at check-in to the Emergency Department and triage nurse.   

## 2018-04-05 NOTE — Telephone Encounter (Signed)
Scheduled appt per 3/19 los. ° °Printed calendar and avs. °

## 2018-04-05 NOTE — Progress Notes (Signed)
Hematology and Oncology Follow Up Visit  Dennis Patel [id number] 1950-05-06 68 y.o. 04/05/2018 8:55 AM [name], [name], [name], [name], MD   Principle Diagnosis: 68 year old man with advanced prostate cancer diagnosed in November 2019.  He presented with castration-sensitive disease to the bone, Gleason score 4+4 = 8 and a PSA of 48.   Prior Therapy:  Prostate biopsy obtained on December 05, 2017 showed a Gleason score of 4+4 = 8.  Current therapy:  Androgen deprivation therapy under the care of Dr. Junious Silk in the form of Mills Koller started in December 2019.  Taxotere 75 mg/m every 3 weeks started on January 31, 2018.  He is status post 3 cycles of therapy.  Interim History: [name] returns today for repeat evaluation.  Since the last visit, he reports no major changes in his health.  He continues to tolerate chemotherapy without any major complications.  He does report mild fatigue and tiredness as well as grade 1 neuropathy that has resolved.  He is ambulating without any major difficulties and his sensory neuropathy has been manageable with Neurontin.  Denies any fevers, chills or night sweats at this time.  He denies any recent hospitalizations or illnesses.   He denied any alteration mental status, neuropathy, confusion or dizziness.  Denies any headaches or lethargy.  Denies any night sweats, weight loss or changes in appetite.  Denied orthopnea, dyspnea on exertion or chest discomfort.  Denies shortness of breath, difficulty breathing hemoptysis or cough.  Denies any abdominal distention, nausea, early satiety or dyspepsia.  Denies any hematuria, frequency, dysuria or nocturia.  Denies any skin irritation, dryness or rash.  Denies any ecchymosis or petechiae.  Denies any lymphadenopathy or clotting.  Denies any heat or cold intolerance.  Denies any anxiety or depression.  Remaining review of system is negative.        Medications: I have reviewed the patient's current  medications.  Current Outpatient Medications  Medication Sig Dispense Refill  . acetaminophen (TYLENOL) 325 MG tablet Take 650 mg by mouth every 6 (six) hours as needed.    Marland Kitchen amLODipine (NORVASC) 10 MG tablet Take 1 tablet (10 mg total) by mouth daily. 30 tablet 0  . aspirin EC 81 MG tablet Take 81 mg by mouth daily.    Marland Kitchen atorvastatin (LIPITOR) 10 MG tablet Take 1 tablet (10 mg total) by mouth daily. 30 tablet 11  . calcium-vitamin D (OSCAL WITH D) 250-125 MG-UNIT tablet Take 1 tablet by mouth daily.    . clopidogrel (PLAVIX) 75 MG tablet Take 1 tablet (75 mg total) by mouth daily. 30 tablet 11  . famotidine (PEPCID) 10 MG tablet Take 10 mg by mouth daily.    Marland Kitchen gabapentin (NEURONTIN) 300 MG capsule Take 1 capsule (300 mg total) by mouth at bedtime. 30 capsule 1  . hydrochlorothiazide (HYDRODIURIL) 12.5 MG tablet Take 12.5 mg by mouth daily.  12  . lidocaine-prilocaine (EMLA) cream Apply 1 application topically as needed. 30 g 0  . Multiple Vitamins-Minerals (ONE DAILY MULTIVITAMIN MEN PO) Take 1 tablet by mouth daily.    Marland Kitchen oxyCODONE-acetaminophen (PERCOCET) 5-325 MG tablet Take 1 tablet by mouth every 6 (six) hours as needed for severe pain (may cause constipation). 60 tablet 0  . promethazine (PHENERGAN) 25 MG tablet Take 1 tablet (25 mg total) by mouth every 6 (six) hours as needed for nausea or vomiting. 30 tablet 0  . tamsulosin (FLOMAX) 0.4 MG CAPS capsule     . traZODone (DESYREL) 50 MG tablet Take  1 tablet (50 mg total) by mouth at bedtime. 30 tablet 1   No current facility-administered medications for this visit.    Facility-Administered Medications Ordered in Other Visits  Medication Dose Route Frequency Provider Last Rate Last Dose  . sodium chloride flush (NS) 0.9 % injection 10 mL  10 mL Intracatheter PRN Wyatt Portela, MD   10 mL at 04/05/18 0849     Allergies: No Known Allergies  Past Medical History, Surgical history, Social history, and Family History were reviewed and  updated.   Physical Exam:    Blood pressure 121/67, pulse 81, temperature 98.8 F (37.1 C), temperature source Oral, resp. rate 18, height 5\' 10"  (1.778 m), weight 237 lb 8 oz (107.7 kg), SpO2 99 %.    ECOG: 0   General appearance: Comfortable appearing without any discomfort Head: Normocephalic without any trauma Oropharynx: Mucous membranes are moist and pink without any thrush or ulcers. Eyes: Pupils are equal and round reactive to light. Lymph nodes: No cervical, supraclavicular, inguinal or axillary lymphadenopathy.   Heart:regular rate and rhythm.  S1 and S2 without leg edema. Lung: Clear without any rhonchi or wheezes.  No dullness to percussion. Abdomin: Soft, nontender, nondistended with good bowel sounds.  No hepatosplenomegaly. Musculoskeletal: No joint deformity or effusion.  Full range of motion noted. Neurological: No deficits noted on motor, sensory and deep tendon reflex exam. Skin: No petechial rash or dryness.  Appeared moist.        Lab Results: Lab Results  Component Value Date   WBC 7.8 03/15/2018   HGB 10.9 (L) 03/15/2018   HCT 33.7 (L) 03/15/2018   MCV 93.6 03/15/2018   PLT 210 03/15/2018     Chemistry      Component Value Date/Time   NA 140 03/15/2018 0748   K 3.7 03/15/2018 0748   CL 107 03/15/2018 0748   CO2 24 03/15/2018 0748   BUN 19 03/15/2018 0748   CREATININE 0.99 03/15/2018 0748      Component Value Date/Time   CALCIUM 9.0 03/15/2018 0748   ALKPHOS 92 03/15/2018 0748   AST 22 03/15/2018 0748   ALT 30 03/15/2018 0748   BILITOT 0.6 03/15/2018 0748      Results for OSBORNE, SERIO (MRN [id number]) as of 04/05/2018 08:56  Ref. Range 01/31/2018 10:01 02/22/2018 08:28 03/15/2018 07:48  Prostate Specific Ag, Serum Latest Ref Range: 0.0 - 4.0 ng/mL 26.5 (H) 31.0 (H) 10.9 (H)     Impression and Plan:   68 year old man with:  1.    Castration-sensitive advanced prostate cancer with disease to the bone.    He is currently  receiving Taxotere chemotherapy without any major complications.  He does have grade 1 sensory neuropathy but manageable at this time.  Risks and benefits of continuing this treatment complete 6 cycles were reviewed today and is agreeable to continue.  His PSA is continuing to respond with appropriate decline.  Continue to monitor his counts closely.    2.  Androgen deprivation therapy: He is currently receiving this under the care of Dr. Junious Silk which I recommended continuing.   3.  Bone directed therapy: He would be a candidate for Xgeva after dental clearance.  For now, he will continues to use calcium and vitamin D supplements.   4.  Bone pain: No issues reported since the last visit.  5.  IV access: Port-A-Cath has been accessed without any difficulties.  6.  Antiemetics: Nausea is improved at this time with Phenergan.  7.  Neutropenia prophylaxis: He will receive that after each cycle of therapy.  He is at high risk at this time for neutropenic sepsis.  8.  Prognosis and goals of care: Therapy remains palliative although aggressive measures are warranted at this time.  9.  Insomnia: Manageable at this time with trazodone.  10.  Neuropathy: He has been using Neurontin without any issues at this time.  11.  Follow-up: In 3 weeks for the next cycle of therapy.  25 minutes was spent with the patient face-to-face today.  More than 50% of time was spent on reviewing his disease status, treatment options and complications related to therapy.    Zola Button, MD 3/19/20208:55 AM

## 2018-04-06 ENCOUNTER — Telehealth: Payer: Self-pay

## 2018-04-06 ENCOUNTER — Other Ambulatory Visit: Payer: Self-pay | Admitting: Oncology

## 2018-04-06 LAB — PROSTATE-SPECIFIC AG, SERUM (LABCORP): PROSTATE SPECIFIC AG, SERUM: 7.5 ng/mL — AB (ref 0.0–4.0)

## 2018-04-06 NOTE — Telephone Encounter (Signed)
Contacted patient and made aware of PSA results. 

## 2018-04-06 NOTE — Telephone Encounter (Signed)
-----   Message from Wyatt Portela, MD sent at 04/06/2018  6:58 AM EDT ----- Please let him know his PSA is down.

## 2018-04-07 ENCOUNTER — Other Ambulatory Visit: Payer: Self-pay

## 2018-04-07 ENCOUNTER — Inpatient Hospital Stay: Payer: Medicare Other

## 2018-04-07 VITALS — BP 119/71 | HR 84 | Temp 98.2°F | Resp 19

## 2018-04-07 DIAGNOSIS — Z9221 Personal history of antineoplastic chemotherapy: Secondary | ICD-10-CM | POA: Diagnosis not present

## 2018-04-07 DIAGNOSIS — Z5111 Encounter for antineoplastic chemotherapy: Secondary | ICD-10-CM | POA: Diagnosis not present

## 2018-04-07 DIAGNOSIS — Z7689 Persons encountering health services in other specified circumstances: Secondary | ICD-10-CM | POA: Diagnosis not present

## 2018-04-07 DIAGNOSIS — C61 Malignant neoplasm of prostate: Secondary | ICD-10-CM | POA: Diagnosis not present

## 2018-04-07 DIAGNOSIS — R5383 Other fatigue: Secondary | ICD-10-CM | POA: Diagnosis not present

## 2018-04-07 DIAGNOSIS — G4700 Insomnia, unspecified: Secondary | ICD-10-CM | POA: Diagnosis not present

## 2018-04-07 DIAGNOSIS — Z79899 Other long term (current) drug therapy: Secondary | ICD-10-CM | POA: Diagnosis not present

## 2018-04-07 DIAGNOSIS — Z7982 Long term (current) use of aspirin: Secondary | ICD-10-CM | POA: Diagnosis not present

## 2018-04-07 MED ORDER — PEGFILGRASTIM-CBQV 6 MG/0.6ML ~~LOC~~ SOSY
6.0000 mg | PREFILLED_SYRINGE | Freq: Once | SUBCUTANEOUS | Status: AC
  Administered 2018-04-07: 6 mg via SUBCUTANEOUS

## 2018-04-07 MED ORDER — PEGFILGRASTIM-CBQV 6 MG/0.6ML ~~LOC~~ SOSY
PREFILLED_SYRINGE | SUBCUTANEOUS | Status: AC
  Filled 2018-04-07: qty 0.6

## 2018-04-07 NOTE — Patient Instructions (Signed)
Pegfilgrastim injection  What is this medicine?  PEGFILGRASTIM (PEG fil gra stim) is a long-acting granulocyte colony-stimulating factor that stimulates the growth of neutrophils, a type of white blood cell important in the body's fight against infection. It is used to reduce the incidence of fever and infection in patients with certain types of cancer who are receiving chemotherapy that affects the bone marrow, and to increase survival after being exposed to high doses of radiation.  This medicine may be used for other purposes; ask your health care provider or pharmacist if you have questions.  COMMON BRAND NAME(S): Fulphila, Neulasta, UDENYCA  What should I tell my health care provider before I take this medicine?  They need to know if you have any of these conditions:  -kidney disease  -latex allergy  -ongoing radiation therapy  -sickle cell disease  -skin reactions to acrylic adhesives (On-Body Injector only)  -an unusual or allergic reaction to pegfilgrastim, filgrastim, other medicines, foods, dyes, or preservatives  -pregnant or trying to get pregnant  -breast-feeding  How should I use this medicine?  This medicine is for injection under the skin. If you get this medicine at home, you will be taught how to prepare and give the pre-filled syringe or how to use the On-body Injector. Refer to the patient Instructions for Use for detailed instructions. Use exactly as directed. Tell your healthcare provider immediately if you suspect that the On-body Injector may not have performed as intended or if you suspect the use of the On-body Injector resulted in a missed or partial dose.  It is important that you put your used needles and syringes in a special sharps container. Do not put them in a trash can. If you do not have a sharps container, call your pharmacist or healthcare provider to get one.  Talk to your pediatrician regarding the use of this medicine in children. While this drug may be prescribed for  selected conditions, precautions do apply.  Overdosage: If you think you have taken too much of this medicine contact a poison control center or emergency room at once.  NOTE: This medicine is only for you. Do not share this medicine with others.  What if I miss a dose?  It is important not to miss your dose. Call your doctor or health care professional if you miss your dose. If you miss a dose due to an On-body Injector failure or leakage, a new dose should be administered as soon as possible using a single prefilled syringe for manual use.  What may interact with this medicine?  Interactions have not been studied.  Give your health care provider a list of all the medicines, herbs, non-prescription drugs, or dietary supplements you use. Also tell them if you smoke, drink alcohol, or use illegal drugs. Some items may interact with your medicine.  This list may not describe all possible interactions. Give your health care provider a list of all the medicines, herbs, non-prescription drugs, or dietary supplements you use. Also tell them if you smoke, drink alcohol, or use illegal drugs. Some items may interact with your medicine.  What should I watch for while using this medicine?  You may need blood work done while you are taking this medicine.  If you are going to need a MRI, CT scan, or other procedure, tell your doctor that you are using this medicine (On-Body Injector only).  What side effects may I notice from receiving this medicine?  Side effects that you should report to   your doctor or health care professional as soon as possible:  -allergic reactions like skin rash, itching or hives, swelling of the face, lips, or tongue  -back pain  -dizziness  -fever  -pain, redness, or irritation at site where injected  -pinpoint red spots on the skin  -red or dark-brown urine  -shortness of breath or breathing problems  -stomach or side pain, or pain at the shoulder  -swelling  -tiredness  -trouble passing urine or  change in the amount of urine  Side effects that usually do not require medical attention (report to your doctor or health care professional if they continue or are bothersome):  -bone pain  -muscle pain  This list may not describe all possible side effects. Call your doctor for medical advice about side effects. You may report side effects to FDA at 1-800-FDA-1088.  Where should I keep my medicine?  Keep out of the reach of children.  If you are using this medicine at home, you will be instructed on how to store it. Throw away any unused medicine after the expiration date on the label.  NOTE: This sheet is a summary. It may not cover all possible information. If you have questions about this medicine, talk to your doctor, pharmacist, or health care provider.   2019 Elsevier/Gold Standard (2017-04-10 16:57:08)

## 2018-04-11 ENCOUNTER — Other Ambulatory Visit: Payer: Self-pay

## 2018-04-11 ENCOUNTER — Ambulatory Visit (INDEPENDENT_AMBULATORY_CARE_PROVIDER_SITE_OTHER): Payer: Medicare Other | Admitting: Vascular Surgery

## 2018-04-11 ENCOUNTER — Encounter: Payer: Self-pay | Admitting: Vascular Surgery

## 2018-04-11 ENCOUNTER — Ambulatory Visit (HOSPITAL_COMMUNITY)
Admission: RE | Admit: 2018-04-11 | Discharge: 2018-04-11 | Disposition: A | Discharge status: Home / Self Care | Payer: Medicare Other | Source: Ambulatory Visit | Attending: Family | Admitting: Family

## 2018-04-11 ENCOUNTER — Ambulatory Visit (INDEPENDENT_AMBULATORY_CARE_PROVIDER_SITE_OTHER)
Admission: RE | Admit: 2018-04-11 | Discharge: 2018-04-11 | Disposition: A | Discharge status: Home / Self Care | Payer: Medicare Other | Source: Ambulatory Visit | Attending: Family | Admitting: Family

## 2018-04-11 VITALS — BP 103/65 | HR 89 | Temp 100.0°F | Resp 20 | Ht 70.0 in | Wt 226.0 lb

## 2018-04-11 DIAGNOSIS — I70219 Atherosclerosis of native arteries of extremities with intermittent claudication, unspecified extremity: Secondary | ICD-10-CM | POA: Diagnosis not present

## 2018-04-11 NOTE — Progress Notes (Signed)
Patient name: Dennis Patel MRN: [id number] DOB: [birth year] Sex: male  REASON FOR VISIT:   Follow-up of peripheral vascular disease.  HPI:   Dennis Patel is a pleasant 68 y.o. male who underwent angioplasty and stenting of the left common iliac artery on 03/17/2017.  He developed recurrent stenosis and on 09/08/2017 had angioplasty and stenting of a recurrent stenosis using an VBX stent.  Of note there was no significant inflow disease on the right.  He comes in for a routine follow-up visit.  Since I saw him last, he has bilateral lower extremity claudication which is more significant on the right side.  He experiences pain in his right calf which is brought on by ambulation and relieved with rest.  He can walk about 25 yards before experiencing symptoms.  He has minimal symptoms on the left side but some claudication in the left calf.  He denies any history of rest pain or nonhealing ulcers.  He does admit to some cramps.  He does have stage IV prostate cancer with mets to the bone and is has 2 more rounds of chemotherapy scheduled in the near future.  He is on aspirin, Plavix, and a statin.  Past Medical History:  Diagnosis Date  . Atherosclerosis   . BPH (benign prostatic hyperplasia)   . Cancer [facility])    prostate  . Cancer ([place])    mets to bone  . Claudication ([place])   . Elevated PSA   . Family history of adverse reaction to anesthesia    ' My Brother had an allergic reaction "  . GERD (gastroesophageal reflux disease)   . History of kidney stones   . Peripheral artery disease ([place])   . Peripheral vascular disease ([place])   . Pure hypercholesterolemia   . 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 Cancer Mother   . Diabetes Mother   . CAD Father   . Diabetes Sister   . CAD Son   . Diabetes Son   . Heart disease Son     SOCIAL HISTORY: Social History   Tobacco Use  . Smoking status: Former Smoker    Types:  Cigarettes    Last attempt to quit: 2005    Years since quitting: 15.2  . Smokeless tobacco: Never Used  Substance Use Topics  . Alcohol use: Never    Frequency: Never    No Known Allergies  Current Outpatient Medications  Medication Sig Dispense Refill  . acetaminophen (TYLENOL) 325 MG tablet Take 650 mg by mouth every 6 (six) hours as needed.    Marland Kitchen amLODipine (NORVASC) 10 MG tablet Take 1 tablet (10 mg total) by mouth daily. 30 tablet 0  . aspirin EC 81 MG tablet Take 81 mg by mouth daily.    Marland Kitchen atorvastatin (LIPITOR) 10 MG tablet Take 1 tablet (10 mg total) by mouth daily. 30 tablet 11  . calcium-vitamin D (OSCAL WITH D) 250-125 MG-UNIT tablet Take 1 tablet by mouth daily.    . clopidogrel (PLAVIX) 75 MG tablet Take 1 tablet (75 mg total) by mouth daily. 30 tablet 11  . famotidine (PEPCID) 10 MG tablet Take 10 mg by mouth daily.    Marland Kitchen gabapentin (NEURONTIN) 300 MG capsule TAKE 1 CAPSULE BY MOUTH EVERYDAY AT BEDTIME 90 capsule 1  . hydrochlorothiazide (HYDRODIURIL) 12.5 MG tablet Take 12.5 mg by mouth daily.  12  . lidocaine-prilocaine (EMLA) cream  Apply 1 application topically as needed. 30 g 0  . Multiple Vitamins-Minerals (ONE DAILY MULTIVITAMIN MEN PO) Take 1 tablet by mouth daily.    Marland Kitchen oxyCODONE-acetaminophen (PERCOCET) 5-325 MG tablet Take 1 tablet by mouth every 6 (six) hours as needed for severe pain (may cause constipation). 60 tablet 0  . promethazine (PHENERGAN) 25 MG tablet Take 1 tablet (25 mg total) by mouth every 6 (six) hours as needed for nausea or vomiting. 30 tablet 0  . tamsulosin (FLOMAX) 0.4 MG CAPS capsule     . traZODone (DESYREL) 50 MG tablet Take 1 tablet (50 mg total) by mouth at bedtime. 30 tablet 1   No current facility-administered medications for this visit.     REVIEW OF SYSTEMS:  [X]  denotes positive finding, [ ]  denotes negative finding Cardiac  Comments:  Chest pain or chest pressure:    Shortness of breath upon exertion: x   Short of breath when  lying flat:    Irregular heart rhythm:        Vascular    Pain in calf, thigh, or hip brought on by ambulation: x   Pain in feet at night that wakes you up from your sleep:     Blood clot in your veins:    Leg swelling:  x       Pulmonary    Oxygen at home:    Productive cough:     Wheezing:         Neurologic    Sudden weakness in arms or legs:     Sudden numbness in arms or legs:     Sudden onset of difficulty speaking or slurred speech:    Temporary loss of vision in one eye:     Problems with dizziness:         Gastrointestinal    Blood in stool:     Vomited blood:         Genitourinary    Burning when urinating:     Blood in urine:        Psychiatric    Major depression:         Hematologic    Bleeding problems:    Problems with blood clotting too easily:        Skin    Rashes or ulcers:        Constitutional    Fever or chills:     PHYSICAL EXAM:   Vitals:   04/11/18 0901  BP: 103/65  Pulse: 89  Resp: 20  Temp: 100 F (37.8 C)  SpO2: 98%  Weight: 226 lb (102.5 kg)  Height: 5\' 10"  (1.778 m)    GENERAL: The patient is a well-nourished male, in no acute distress. The vital signs are documented above. CARDIAC: There is a regular rate and rhythm.  VASCULAR: I do not detect carotid bruits. He has diminished femoral pulses.  I cannot palpate popliteal or pedal pulses. PULMONARY: There is good air exchange bilaterally without wheezing or rales. ABDOMEN: Soft and non-tender with normal pitched bowel sounds.  MUSCULOSKELETAL: There are no major deformities or cyanosis. NEUROLOGIC: No focal weakness or paresthesias are detected. SKIN: There are no ulcers or rashes noted. PSYCHIATRIC: The patient has a normal affect.  DATA:    ARTERIAL DOPPLER STUDY: I have independently interpreted the patient's arterial Doppler study today.  On the right side, there is a monophasic dorsalis pedis and posterior tibial signal.  ABI is 53%.  Toe pressure is 76 mmHg.   On  the left side there is a biphasic dorsalis pedis and posterior tibial signal.  ABI is 62%.  Toe pressure is 83 mmHg.  DUPLEX BILATERAL ILIAC ARTERIES: I have independently interpreted the duplex of the iliac arteries.  On the left side, which is the side that had the stent, there is a biphasic signal throughout the common iliac artery and external iliac artery.  There is no significant stenosis noted.  On the right side there is biphasic flow in the external iliac artery with no significant stenosis noted.  There is a greater than 75% stenosis in the right common femoral artery.  MEDICAL ISSUES:   PERIPHERAL VASCULAR DISEASE: The patient's left common iliac artery stent is widely patent.  He is on aspirin, Plavix, and a statin.  I encouraged him to stay as active as possible.  We discussed the importance of a structured walking program and also nutrition.  He does have a common femoral artery stenosis on the right which likely is contributing to his right lower extremity claudication.  I did review his previous arteriogram and he does have some plaque in the right common femoral artery.  I do not have good images of the proximal superficial femoral artery.  If his symptoms progress he may need a follow-up arteriogram in the future to plan our options for revascularization.  However, hopefully his symptoms will not progress significantly.  Currently he simply has claudication with no rest pain or critical limb ischemia.  Deitra Mayo [facility] [phone]

## 2018-04-20 ENCOUNTER — Encounter: Payer: Self-pay | Admitting: General Practice

## 2018-04-20 NOTE — Progress Notes (Signed)
[place] Team contacted patient to assess for food insecurity and other psychosocial needs during current COVID19 pandemic.  "Socially distancing, staying in the house, family checks on me every day, Im doing fine."    Patient/family expressed no needs at this time.  Support Team member encouraged patient to call if changes occur or they have any other questions/concerns.    Beverely Pace, [place]

## 2018-04-23 ENCOUNTER — Telehealth: Payer: Self-pay | Admitting: *Deleted

## 2018-04-23 ENCOUNTER — Other Ambulatory Visit: Payer: Self-pay | Admitting: *Deleted

## 2018-04-23 MED ORDER — FUROSEMIDE 20 MG PO TABS: 20.0000 mg | ORAL_TABLET | Freq: Every day | ORAL | 0 refills | Status: DC

## 2018-04-23 NOTE — Telephone Encounter (Signed)
Patient called with concerns about worsening Edema.  He states he spoke to you about it last time he saw you on 04/05/2018.  He said that he is unable to walk comfortably and use his hands without discomfort due to the swelling.  He weighted himself at home and has seen a 13 lb weight gain in the past couple of days.  He is currently receiving Taxotere.  He already takes HCTZ daily.  He said he had some left over Lasix from his wife and tried that and has some beneficial effect.  He also got a report the other day that his right femoral artery is 62% blocked and the left is 56% blocked.  However, his swelling isn't limited isn't limited to his lower extremities.    Would you be willing to send in Rx or do you want to have a visit with the patient and if so what type?  Per Dr. Alen Blew ok to Rx Lasix 20 mg #30.  Instruct patient to stop taking HCTZ.  Patient aware and will be able to give update when he is here this week for treatment.

## 2018-04-26 ENCOUNTER — Inpatient Hospital Stay (HOSPITAL_BASED_OUTPATIENT_CLINIC_OR_DEPARTMENT_OTHER): Payer: Medicare Other | Admitting: Oncology

## 2018-04-26 ENCOUNTER — Telehealth: Payer: Self-pay | Admitting: Oncology

## 2018-04-26 ENCOUNTER — Inpatient Hospital Stay: Payer: Medicare Other

## 2018-04-26 ENCOUNTER — Other Ambulatory Visit: Payer: Self-pay

## 2018-04-26 ENCOUNTER — Inpatient Hospital Stay: Payer: Medicare Other | Attending: Oncology

## 2018-04-26 VITALS — BP 115/61 | HR 86 | Temp 97.6°F | Resp 18 | Ht 70.0 in | Wt 243.3 lb

## 2018-04-26 DIAGNOSIS — G629 Polyneuropathy, unspecified: Secondary | ICD-10-CM | POA: Insufficient documentation

## 2018-04-26 DIAGNOSIS — G893 Neoplasm related pain (acute) (chronic): Secondary | ICD-10-CM | POA: Insufficient documentation

## 2018-04-26 DIAGNOSIS — Z79899 Other long term (current) drug therapy: Secondary | ICD-10-CM | POA: Diagnosis not present

## 2018-04-26 DIAGNOSIS — M79671 Pain in right foot: Secondary | ICD-10-CM | POA: Insufficient documentation

## 2018-04-26 DIAGNOSIS — C7951 Secondary malignant neoplasm of bone: Secondary | ICD-10-CM

## 2018-04-26 DIAGNOSIS — C61 Malignant neoplasm of prostate: Secondary | ICD-10-CM | POA: Diagnosis not present

## 2018-04-26 DIAGNOSIS — Z95828 Presence of other vascular implants and grafts: Secondary | ICD-10-CM

## 2018-04-26 DIAGNOSIS — M7989 Other specified soft tissue disorders: Secondary | ICD-10-CM | POA: Diagnosis not present

## 2018-04-26 DIAGNOSIS — G4700 Insomnia, unspecified: Secondary | ICD-10-CM | POA: Diagnosis not present

## 2018-04-26 DIAGNOSIS — R600 Localized edema: Secondary | ICD-10-CM

## 2018-04-26 DIAGNOSIS — Z7689 Persons encountering health services in other specified circumstances: Secondary | ICD-10-CM | POA: Diagnosis not present

## 2018-04-26 DIAGNOSIS — Z7982 Long term (current) use of aspirin: Secondary | ICD-10-CM | POA: Insufficient documentation

## 2018-04-26 DIAGNOSIS — Z191 Hormone sensitive malignancy status: Secondary | ICD-10-CM | POA: Diagnosis not present

## 2018-04-26 DIAGNOSIS — R110 Nausea: Secondary | ICD-10-CM

## 2018-04-26 DIAGNOSIS — Z5111 Encounter for antineoplastic chemotherapy: Secondary | ICD-10-CM | POA: Insufficient documentation

## 2018-04-26 LAB — CBC WITH DIFFERENTIAL (CANCER CENTER ONLY)
Abs Immature Granulocytes: 0.08 10*3/uL — ABNORMAL HIGH (ref 0.00–0.07)
Basophils Absolute: 0 10*3/uL (ref 0.0–0.1)
Basophils Relative: 1 %
Eosinophils Absolute: 0.2 10*3/uL (ref 0.0–0.5)
Eosinophils Relative: 3 %
HCT: 32.3 % — ABNORMAL LOW (ref 39.0–52.0)
Hemoglobin: 10.2 g/dL — ABNORMAL LOW (ref 13.0–17.0)
Immature Granulocytes: 1 %
Lymphocytes Relative: 12 %
Lymphs Abs: 0.9 10*3/uL (ref 0.7–4.0)
MCH: 31.7 pg (ref 26.0–34.0)
MCHC: 31.6 g/dL (ref 30.0–36.0)
MCV: 100.3 fL — ABNORMAL HIGH (ref 80.0–100.0)
Monocytes Absolute: 1.1 10*3/uL — ABNORMAL HIGH (ref 0.1–1.0)
Monocytes Relative: 15 %
Neutro Abs: 5 10*3/uL (ref 1.7–7.7)
Neutrophils Relative %: 68 %
Platelet Count: 202 10*3/uL (ref 150–400)
RBC: 3.22 MIL/uL — ABNORMAL LOW (ref 4.22–5.81)
RDW: 19.3 % — ABNORMAL HIGH (ref 11.5–15.5)
WBC Count: 7.3 10*3/uL (ref 4.0–10.5)
nRBC: 0 % (ref 0.0–0.2)

## 2018-04-26 LAB — CMP (CANCER CENTER ONLY)
ALT: 20 U/L (ref 0–44)
AST: 18 U/L (ref 15–41)
Albumin: 3.1 g/dL — ABNORMAL LOW (ref 3.5–5.0)
Alkaline Phosphatase: 65 U/L (ref 38–126)
Anion gap: 9 (ref 5–15)
BUN: 16 mg/dL (ref 8–23)
CO2: 24 mmol/L (ref 22–32)
Calcium: 8.4 mg/dL — ABNORMAL LOW (ref 8.9–10.3)
Chloride: 107 mmol/L (ref 98–111)
Creatinine: 0.99 mg/dL (ref 0.61–1.24)
GFR, Est AFR Am: >60 mL/min (ref >60)
GFR, Estimated: >60 mL/min (ref >60)
Glucose, Bld: 167 mg/dL — ABNORMAL HIGH (ref 70–99)
Potassium: 3.7 mmol/L (ref 3.5–5.1)
Sodium: 140 mmol/L (ref 135–145)
Total Bilirubin: 0.5 mg/dL (ref 0.3–1.2)
Total Protein: 5.6 g/dL — ABNORMAL LOW (ref 6.5–8.1)

## 2018-04-26 MED ORDER — SODIUM CHLORIDE 0.9% FLUSH
10.0000 mL | INTRAVENOUS | Status: DC | PRN
  Administered 2018-04-26: 10 mL
  Filled 2018-04-26: qty 10

## 2018-04-26 MED ORDER — PALONOSETRON HCL INJECTION 0.25 MG/5ML
INTRAVENOUS | Status: AC
  Filled 2018-04-26: qty 5

## 2018-04-26 MED ORDER — HEPARIN SOD (PORK) LOCK FLUSH 100 UNIT/ML IV SOLN
500.0000 [IU] | Freq: Once | INTRAVENOUS | Status: AC | PRN
  Administered 2018-04-26: 500 [IU]
  Filled 2018-04-26: qty 5

## 2018-04-26 MED ORDER — GABAPENTIN 300 MG PO CAPS: 300.0000 mg | ORAL_CAPSULE | Freq: Every day | ORAL | 1 refills | Status: DC

## 2018-04-26 MED ORDER — DEXAMETHASONE SODIUM PHOSPHATE 10 MG/ML IJ SOLN
INTRAMUSCULAR | Status: AC
  Filled 2018-04-26: qty 1

## 2018-04-26 MED ORDER — DEXAMETHASONE SODIUM PHOSPHATE 10 MG/ML IJ SOLN
10.0000 mg | Freq: Once | INTRAMUSCULAR | Status: AC
  Administered 2018-04-26: 10 mg via INTRAVENOUS

## 2018-04-26 MED ORDER — SODIUM CHLORIDE 0.9 % IV SOLN
Freq: Once | INTRAVENOUS | Status: AC
  Administered 2018-04-26: 11:00:00 via INTRAVENOUS
  Filled 2018-04-26: qty 250

## 2018-04-26 MED ORDER — SODIUM CHLORIDE 0.9 % IV SOLN
75.0000 mg/m2 | Freq: Once | INTRAVENOUS | Status: AC
  Administered 2018-04-26: 170 mg via INTRAVENOUS
  Filled 2018-04-26: qty 17

## 2018-04-26 MED ORDER — TRAZODONE HCL 50 MG PO TABS: 50.0000 mg | ORAL_TABLET | Freq: Every day | ORAL | 1 refills | Status: DC

## 2018-04-26 MED ORDER — PROMETHAZINE HCL 25 MG PO TABS: 25.0000 mg | ORAL_TABLET | Freq: Four times a day (QID) | ORAL | 3 refills | Status: DC | PRN

## 2018-04-26 NOTE — Patient Instructions (Signed)
[facility] Discharge Instructions for Patients Receiving Chemotherapy  Today you received the following chemotherapy agents: Taxotere.  To help prevent nausea and vomiting after your treatment, we encourage you to take your nausea medication as directed.    If you develop nausea and vomiting that is not controlled by your nausea medication, call the clinic.   BELOW ARE SYMPTOMS THAT SHOULD BE REPORTED IMMEDIATELY:  *FEVER GREATER THAN 100.5 F  *CHILLS WITH OR WITHOUT FEVER  NAUSEA AND VOMITING THAT IS NOT CONTROLLED WITH YOUR NAUSEA MEDICATION  *UNUSUAL SHORTNESS OF BREATH  *UNUSUAL BRUISING OR BLEEDING  TENDERNESS IN MOUTH AND THROAT WITH OR WITHOUT PRESENCE OF ULCERS  *URINARY PROBLEMS  *BOWEL PROBLEMS  UNUSUAL RASH Items with * indicate a potential emergency and should be followed up as soon as possible.  Feel free to call the clinic should you have any questions or concerns. The clinic phone number is [phone].  Please show the [place] at check-in to the Emergency Department and triage nurse.  This information is directly available on the CDC website: RunningShows.co.za.html    Source:CDC Reference to specific commercial products, manufacturers, companies, or trademarks does not constitute its endorsement or recommendation by the [place], [place], or Centers for [employer] and [employer].

## 2018-04-26 NOTE — Progress Notes (Signed)
Hematology and Oncology Follow Up Visit  MICHAELPAUL APO [id number] 1950-05-02 68 y.o. 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  He was found to have Gleason score 4+4 = 8 and a PSA of 48 in November 2019.  Prior Therapy:  Prostate biopsy obtained on December 05, 2017 showed a Gleason score of 4+4 = 8.  Current therapy:  Androgen deprivation therapy under the care of Dr. Junious Silk in the form of Mills Koller started in December 2019.  Taxotere 75 mg/m every 3 weeks started on January 31, 2018.  He is here for cycle 5 of therapy.  Interim History: Mr. Gratz is here for a follow-up visit.  Since the last visit, he reports no major changes in his health.  He has reported increase in his lower extremity edema and has been using Lasix instead of hydrochlorothiazide.  Swelling has not increased but has not regressed either.  He is to have peripheral neuropathy in his lower extremity which is chronic and unchanged.  He does report some mild nausea that is manageable with Phenergan.  He denies any vomiting or abdominal pain.  His nausea resolves after 5 days of therapy.  Continues to ambulate without any major difficulties.  Patient denied headaches, blurry vision, syncope or seizures.  Denies any fevers, chills or sweats.  Denied chest pain, palpitation, orthopnea or leg edema.  Denied cough, wheezing or hemoptysis.  Denied nausea, vomiting or abdominal pain.  Denies any constipation or diarrhea.  Denies any frequency urgency or hesitancy.  Denies any arthralgias or myalgias.  Denies any skin rashes or lesions.  Denies any bleeding or clotting tendency.  Denies any easy bruising.  Denies any hair or nail changes.  Denies any anxiety or depression.  Remaining review of system is negative.          Medications: I have reviewed the patient's current medications.   Current Outpatient Medications  Medication Sig Dispense Refill  . acetaminophen (TYLENOL) 325 MG tablet Take 650 mg by mouth every 6 (six) hours as needed.    Marland Kitchen amLODipine (NORVASC) 10 MG tablet Take 1 tablet (10 mg total) by mouth daily. 30 tablet 0  . aspirin EC 81 MG tablet Take 81 mg by mouth daily.    Marland Kitchen atorvastatin (LIPITOR) 10 MG tablet Take 1 tablet (10 mg total) by mouth daily. 30 tablet 11  . calcium-vitamin D (OSCAL WITH D) 250-125 MG-UNIT tablet Take 1 tablet by mouth daily.    . clopidogrel (PLAVIX) 75 MG tablet Take 1 tablet (75 mg total) by mouth daily. 30 tablet 11  . famotidine (PEPCID) 10 MG tablet Take 10 mg by mouth daily.    . furosemide (LASIX) 20 MG tablet Take 1 tablet (20 mg total) by mouth daily. 30 tablet 0  . gabapentin (NEURONTIN) 300 MG capsule TAKE 1 CAPSULE BY MOUTH EVERYDAY AT BEDTIME 90 capsule 1  . hydrochlorothiazide (HYDRODIURIL) 12.5 MG tablet Take 12.5 mg by mouth daily.  12  . lidocaine-prilocaine (EMLA) cream Apply 1 application topically as needed. 30 g 0  . Multiple Vitamins-Minerals (ONE DAILY MULTIVITAMIN MEN PO) Take 1 tablet by mouth daily.    Marland Kitchen oxyCODONE-acetaminophen (PERCOCET) 5-325 MG tablet Take 1 tablet by mouth every 6 (six) hours as needed for severe pain (may cause constipation). 60 tablet 0  . promethazine (PHENERGAN) 25 MG tablet Take 1 tablet (25 mg total) by mouth every 6 (six)  hours as needed for nausea or vomiting. 30 tablet 0  . tamsulosin (FLOMAX) 0.4 MG CAPS capsule     . traZODone (DESYREL) 50 MG tablet Take 1 tablet (50 mg total) by mouth at bedtime. 30 tablet 1   No current facility-administered medications for this visit.    Facility-Administered Medications Ordered in Other Visits  Medication Dose Route Frequency Provider Last Rate Last Dose  . sodium chloride flush (NS) 0.9 % injection 10 mL  10 mL Intracatheter PRN Wyatt Portela, MD   10 mL at 04/26/18 0973     Allergies: No Known Allergies  Past Medical History,  Surgical history, Social history, and Family History were reviewed and updated.   Physical Exam:    Blood pressure 115/61, pulse 86, temperature 97.6 F (36.4 C), temperature source Oral, resp. rate 18, height 5\' 10"  (1.778 m), weight 243 lb 4.8 oz (110.4 kg), SpO2 97 %.    ECOG: 0    General appearance: Alert, awake without any distress. Head: Atraumatic without abnormalities Oropharynx: Without any thrush or ulcers. Eyes: No scleral icterus. Lymph nodes: No lymphadenopathy noted in the cervical, supraclavicular, or axillary nodes Heart:regular rate and rhythm, without any murmurs or gallops.    1+ edema bilaterally at the level of shin. Lung: Clear to auscultation without any rhonchi, wheezes or dullness to percussion. Abdomin: Soft, nontender without any shifting dullness or ascites. Musculoskeletal: No clubbing or cyanosis. Neurological: No motor or sensory deficits. Skin: No rashes or lesions.        Lab Results: Lab Results  Component Value Date   WBC 7.5 04/05/2018   HGB 10.8 (L) 04/05/2018   HCT 33.7 (L) 04/05/2018   MCV 98.5 04/05/2018   PLT 213 04/05/2018     Chemistry      Component Value Date/Time   NA 141 04/05/2018 0814   K 4.0 04/05/2018 0814   CL 108 04/05/2018 0814   CO2 23 04/05/2018 0814   BUN 17 04/05/2018 0814   CREATININE 0.97 04/05/2018 0814      Component Value Date/Time   CALCIUM 8.3 (L) 04/05/2018 0814   ALKPHOS 79 04/05/2018 0814   AST 19 04/05/2018 0814   ALT 27 04/05/2018 0814   BILITOT 0.7 04/05/2018 0814      Results for RONNEL, ZUERCHER (MRN [id number]) as of 04/26/2018 09:07  Ref. Range 02/22/2018 08:28 03/15/2018 07:48 04/05/2018 08:14  Prostate Specific Ag, Serum Latest Ref Range: 0.0 - 4.0 ng/mL 31.0 (H) 10.9 (H) 7.5 (H)      Impression and Plan:   68 year old man with:  1.    Advanced prostate cancer with disease to the bone diagnosed in November 2019.  He has castration-sensitive disease at this time.  He  completed 4 cycles of Taxotere chemotherapy planned 6 cycles.  He is experiencing some side effects including edema, neuropathy and fatigue.  Risks and benefits of continuing chemotherapy at this time dose and schedule was discussed today.  Alternatively dose reduction or discontinuation could be considered.  After discussion today, he is agreeable to proceed without any dose reduction or delay.  The side effects appears to be mild and manageable and anticipate improvement after discontinuation of therapy.  Upon completing 6 cycles of therapy, we will obtain staging work-up including CT scan and a bone scan.  Different salvage therapies were reviewed today which could be a possibility in the future.  These would include Zytiga, Xtandi and possible Xofigo.    2.  Androgen deprivation therapy: I  recommended continuing androgen deprivation indefinitely.  His PSA continues to decline appropriately at this time.  He continues to receive that under the care of Dr. Junious Silk.   3.  Bone directed therapy: He is currently on calcium and vitamin D supplements which I recommended continuing.  Delton See will be considered after obtaining dental clearance.   4.  Bone pain: None reported at this time.  5.  IV access: Port-A-Cath remains in place without any recent issues or concerns.  6.  Antiemetics: Phenergan has been effective in treating his nausea.  Prescription has been refilled for him.  7.  Neutropenia prophylaxis: He will receive that after each cycle of therapy.  He is at high risk at this time for neutropenic sepsis.  8.  Prognosis and goals of care: His disease is incurable but therapy is indicated for palliative purposes given his excellent performance status.  9.  Insomnia: He is currently on trazodone which is helpful in aiding with sleep.  10.  Neuropathy: Remains on Neurontin which is effective at this time.  11.  Follow-up: In 3 weeks for the next cycle of chemotherapy.  25 minutes was  spent with the patient face-to-face today.  More than 50% of time was dedicated to addressing his treatment options, complications related to therapy and addressing future plan of care.   Zola Button, MD 4/9/20209:04 AM

## 2018-04-26 NOTE — Telephone Encounter (Signed)
No los per 4/9. °

## 2018-04-27 ENCOUNTER — Telehealth: Payer: Self-pay | Admitting: *Deleted

## 2018-04-27 LAB — PROSTATE-SPECIFIC AG, SERUM (LABCORP): Prostate Specific Ag, Serum: 6.3 ng/mL — ABNORMAL HIGH (ref 0.0–4.0)

## 2018-04-27 NOTE — Telephone Encounter (Signed)
-----   Message from Wyatt Portela, MD sent at 04/27/2018  6:56 AM EDT ----- Please let him know his PSA is down.

## 2018-04-27 NOTE — Telephone Encounter (Signed)
Notified patient of most recent PSA result per Dr. Alen Blew.

## 2018-04-28 ENCOUNTER — Inpatient Hospital Stay: Payer: Medicare Other

## 2018-04-28 VITALS — BP 134/75 | HR 92 | Temp 98.6°F | Resp 18

## 2018-04-28 DIAGNOSIS — G629 Polyneuropathy, unspecified: Secondary | ICD-10-CM | POA: Diagnosis not present

## 2018-04-28 DIAGNOSIS — M79671 Pain in right foot: Secondary | ICD-10-CM | POA: Diagnosis not present

## 2018-04-28 DIAGNOSIS — Z79899 Other long term (current) drug therapy: Secondary | ICD-10-CM | POA: Diagnosis not present

## 2018-04-28 DIAGNOSIS — Z7982 Long term (current) use of aspirin: Secondary | ICD-10-CM | POA: Diagnosis not present

## 2018-04-28 DIAGNOSIS — C7951 Secondary malignant neoplasm of bone: Secondary | ICD-10-CM | POA: Diagnosis not present

## 2018-04-28 DIAGNOSIS — G893 Neoplasm related pain (acute) (chronic): Secondary | ICD-10-CM | POA: Diagnosis not present

## 2018-04-28 DIAGNOSIS — C61 Malignant neoplasm of prostate: Secondary | ICD-10-CM

## 2018-04-28 DIAGNOSIS — M7989 Other specified soft tissue disorders: Secondary | ICD-10-CM | POA: Diagnosis not present

## 2018-04-28 DIAGNOSIS — Z7689 Persons encountering health services in other specified circumstances: Secondary | ICD-10-CM | POA: Diagnosis not present

## 2018-04-28 DIAGNOSIS — R600 Localized edema: Secondary | ICD-10-CM | POA: Diagnosis not present

## 2018-04-28 DIAGNOSIS — G4700 Insomnia, unspecified: Secondary | ICD-10-CM | POA: Diagnosis not present

## 2018-04-28 DIAGNOSIS — Z191 Hormone sensitive malignancy status: Secondary | ICD-10-CM | POA: Diagnosis not present

## 2018-04-28 DIAGNOSIS — R110 Nausea: Secondary | ICD-10-CM | POA: Diagnosis not present

## 2018-04-28 DIAGNOSIS — Z5111 Encounter for antineoplastic chemotherapy: Secondary | ICD-10-CM | POA: Diagnosis not present

## 2018-04-28 MED ORDER — PEGFILGRASTIM-CBQV 6 MG/0.6ML ~~LOC~~ SOSY
6.0000 mg | PREFILLED_SYRINGE | Freq: Once | SUBCUTANEOUS | Status: AC
  Administered 2018-04-28: 6 mg via SUBCUTANEOUS

## 2018-04-28 MED ORDER — PEGFILGRASTIM-CBQV 6 MG/0.6ML ~~LOC~~ SOSY
PREFILLED_SYRINGE | SUBCUTANEOUS | Status: AC
  Filled 2018-04-28: qty 0.6

## 2018-05-06 ENCOUNTER — Other Ambulatory Visit: Payer: Self-pay | Admitting: Oncology

## 2018-05-10 ENCOUNTER — Telehealth: Payer: Self-pay

## 2018-05-10 NOTE — Telephone Encounter (Signed)
Neulasta Onpro Patient Outreach Note  Patient was contacted on 05/10/2018 in regards to switching G-CSF therapy to Neulasta Onpro ([email or web address]). Patient was educated on the purpose of this proposed change in therapy due to COVID-19 pandemic. Patient was educated about Neulasta Onpro on-body injector and patient will be provided with an educational video while in infusion on their next scheduled date if changed to Neulasta Onpro.   [x]  Patient agrees to change in therapy. Begin process to change to Neulasta Onpro therapy.  []  Patient does not agree to change in therapy. No change to Neulasta Onpro at this time.    Thank You,  Kerri Perches  05/10/2018 12:43 PM

## 2018-05-11 ENCOUNTER — Other Ambulatory Visit: Payer: Self-pay | Admitting: Oncology

## 2018-05-11 MED ORDER — OXYCODONE-ACETAMINOPHEN 5-325 MG PO TABS: 1.0000 | ORAL_TABLET | Freq: Four times a day (QID) | ORAL | 0 refills | Status: DC | PRN

## 2018-05-17 ENCOUNTER — Inpatient Hospital Stay (HOSPITAL_BASED_OUTPATIENT_CLINIC_OR_DEPARTMENT_OTHER): Payer: Medicare Other | Admitting: Oncology

## 2018-05-17 ENCOUNTER — Inpatient Hospital Stay: Payer: Medicare Other

## 2018-05-17 ENCOUNTER — Other Ambulatory Visit: Payer: Self-pay

## 2018-05-17 VITALS — BP 93/59 | HR 84 | Temp 98.2°F | Resp 18 | Ht 70.0 in | Wt 241.7 lb

## 2018-05-17 DIAGNOSIS — R600 Localized edema: Secondary | ICD-10-CM

## 2018-05-17 DIAGNOSIS — G893 Neoplasm related pain (acute) (chronic): Secondary | ICD-10-CM

## 2018-05-17 DIAGNOSIS — Z7982 Long term (current) use of aspirin: Secondary | ICD-10-CM

## 2018-05-17 DIAGNOSIS — R110 Nausea: Secondary | ICD-10-CM

## 2018-05-17 DIAGNOSIS — Z95828 Presence of other vascular implants and grafts: Secondary | ICD-10-CM

## 2018-05-17 DIAGNOSIS — M7989 Other specified soft tissue disorders: Secondary | ICD-10-CM

## 2018-05-17 DIAGNOSIS — Z191 Hormone sensitive malignancy status: Secondary | ICD-10-CM

## 2018-05-17 DIAGNOSIS — M79671 Pain in right foot: Secondary | ICD-10-CM

## 2018-05-17 DIAGNOSIS — Z5111 Encounter for antineoplastic chemotherapy: Secondary | ICD-10-CM

## 2018-05-17 DIAGNOSIS — C61 Malignant neoplasm of prostate: Secondary | ICD-10-CM

## 2018-05-17 DIAGNOSIS — Z7689 Persons encountering health services in other specified circumstances: Secondary | ICD-10-CM

## 2018-05-17 DIAGNOSIS — C7951 Secondary malignant neoplasm of bone: Secondary | ICD-10-CM

## 2018-05-17 DIAGNOSIS — G4700 Insomnia, unspecified: Secondary | ICD-10-CM

## 2018-05-17 DIAGNOSIS — G629 Polyneuropathy, unspecified: Secondary | ICD-10-CM

## 2018-05-17 DIAGNOSIS — Z79899 Other long term (current) drug therapy: Secondary | ICD-10-CM

## 2018-05-17 LAB — CBC WITH DIFFERENTIAL (CANCER CENTER ONLY)
Abs Immature Granulocytes: 0.1 10*3/uL — ABNORMAL HIGH (ref 0.00–0.07)
Basophils Absolute: 0 10*3/uL (ref 0.0–0.1)
Basophils Relative: 0 %
Eosinophils Absolute: 0 10*3/uL (ref 0.0–0.5)
Eosinophils Relative: 0 %
HCT: 32.6 % — ABNORMAL LOW (ref 39.0–52.0)
Hemoglobin: 10.2 g/dL — ABNORMAL LOW (ref 13.0–17.0)
Immature Granulocytes: 1 %
Lymphocytes Relative: 10 %
Lymphs Abs: 1.1 10*3/uL (ref 0.7–4.0)
MCH: 31.5 pg (ref 26.0–34.0)
MCHC: 31.3 g/dL (ref 30.0–36.0)
MCV: 100.6 fL — ABNORMAL HIGH (ref 80.0–100.0)
Monocytes Absolute: 1.3 10*3/uL — ABNORMAL HIGH (ref 0.1–1.0)
Monocytes Relative: 12 %
Neutro Abs: 8.2 10*3/uL — ABNORMAL HIGH (ref 1.7–7.7)
Neutrophils Relative %: 77 %
Platelet Count: 232 10*3/uL (ref 150–400)
RBC: 3.24 MIL/uL — ABNORMAL LOW (ref 4.22–5.81)
RDW: 18.8 % — ABNORMAL HIGH (ref 11.5–15.5)
WBC Count: 10.7 10*3/uL — ABNORMAL HIGH (ref 4.0–10.5)
nRBC: 0 % (ref 0.0–0.2)

## 2018-05-17 LAB — CMP (CANCER CENTER ONLY)
ALT: 18 U/L (ref 0–44)
AST: 17 U/L (ref 15–41)
Albumin: 3 g/dL — ABNORMAL LOW (ref 3.5–5.0)
Alkaline Phosphatase: 70 U/L (ref 38–126)
Anion gap: 10 (ref 5–15)
BUN: 15 mg/dL (ref 8–23)
CO2: 24 mmol/L (ref 22–32)
Calcium: 8.2 mg/dL — ABNORMAL LOW (ref 8.9–10.3)
Chloride: 108 mmol/L (ref 98–111)
Creatinine: 0.92 mg/dL (ref 0.61–1.24)
GFR, Est AFR Am: >60 mL/min (ref >60)
GFR, Estimated: >60 mL/min (ref >60)
Glucose, Bld: 139 mg/dL — ABNORMAL HIGH (ref 70–99)
Potassium: 3.7 mmol/L (ref 3.5–5.1)
Sodium: 142 mmol/L (ref 135–145)
Total Bilirubin: 0.4 mg/dL (ref 0.3–1.2)
Total Protein: 5.6 g/dL — ABNORMAL LOW (ref 6.5–8.1)

## 2018-05-17 MED ORDER — DOXYCYCLINE HYCLATE 50 MG PO CAPS: 50.0000 mg | ORAL_CAPSULE | Freq: Two times a day (BID) | ORAL | 0 refills | Status: DC

## 2018-05-17 MED ORDER — SODIUM CHLORIDE 0.9% FLUSH
10.0000 mL | INTRAVENOUS | Status: DC | PRN
  Administered 2018-05-17: 09:00:00 10 mL
  Filled 2018-05-17: qty 10

## 2018-05-17 MED ORDER — OXYCODONE HCL 5 MG PO TABS: 5.0000 mg | ORAL_TABLET | ORAL | 0 refills | Status: DC | PRN

## 2018-05-17 MED ORDER — HEPARIN SOD (PORK) LOCK FLUSH 100 UNIT/ML IV SOLN
500.0000 [IU] | Freq: Once | INTRAVENOUS | Status: AC | PRN
  Administered 2018-05-17: 500 [IU]
  Filled 2018-05-17: qty 5

## 2018-05-17 MED ORDER — SODIUM CHLORIDE 0.9% FLUSH
10.0000 mL | INTRAVENOUS | Status: DC | PRN
  Administered 2018-05-17: 10 mL
  Filled 2018-05-17: qty 10

## 2018-05-17 NOTE — Progress Notes (Signed)
Hematology and Oncology Follow Up Visit  Dennis Patel [id number] 11-08-1950 68 y.o. 05/17/2018 9:08 AM [name], [name], [name], [name], MD   Principle Diagnosis: Dennis Patel with advanced prostate cancer with disease to the bone diagnosed in November 2019.  He has castration-sensitive disease with Gleason score 4+4 = 8 and a PSA of 48 at time of diagnosis.  Prior Therapy:  Prostate biopsy obtained on December 05, 2017 showed a Gleason score of 4+4 = 8.  Current therapy:  Androgen deprivation therapy under the care of Dr. Junious Silk in the form of Mills Koller started in December 2019.  Taxotere 75 mg/m every 3 weeks started on January 31, 2018.  He is here for cycle 6.  Interim History: Dennis Patel is here for repeat evaluation.  Since last visit, he reports a few complaints related to chemotherapy.  He continues to have swelling in his upper and lower extremities with worsening discoloration of his right foot.  He have also noticed worsening neuropathy as well as erythema noted.  He is having increased pain with difficulty ambulating on that right foot.  He denies any fevers, chills or sweats.  He denies any hospitalization.  His appetite remained about the same.   He denied any alteration mental status, neuropathy, confusion or dizziness.  Denies any headaches or lethargy.  Denies any night sweats, weight loss or changes in appetite.  Denied orthopnea, dyspnea on exertion or chest discomfort.  Denies shortness of breath, difficulty breathing hemoptysis or cough.  Denies any abdominal distention, nausea, early satiety or dyspepsia.  Denies any hematuria, frequency, dysuria or nocturia.  Denies any ecchymosis or petechiae.  Denies any lymphadenopathy or clotting.  Denies any heat or cold intolerance.  Denies any anxiety or depression.  Remaining review of system is negative.            Medications: I have reviewed the patient's current medications.  Current Outpatient  Medications  Medication Sig Dispense Refill  . acetaminophen (TYLENOL) 325 MG tablet Take 650 mg by mouth every 6 (six) hours as needed.    Marland Kitchen amLODipine (NORVASC) 10 MG tablet Take 1 tablet (10 mg total) by mouth daily. 30 tablet 0  . aspirin EC 81 MG tablet Take 81 mg by mouth daily.    Marland Kitchen atorvastatin (LIPITOR) 10 MG tablet Take 1 tablet (10 mg total) by mouth daily. 30 tablet 11  . calcium-vitamin D (OSCAL WITH D) 250-125 MG-UNIT tablet Take 1 tablet by mouth daily.    . clopidogrel (PLAVIX) 75 MG tablet Take 1 tablet (75 mg total) by mouth daily. 30 tablet 11  . famotidine (PEPCID) 10 MG tablet Take 10 mg by mouth daily.    . furosemide (LASIX) 20 MG tablet TAKE 1 TABLET BY MOUTH EVERY DAY 30 tablet 0  . gabapentin (NEURONTIN) 300 MG capsule Take 1 capsule (300 mg total) by mouth at bedtime. 90 capsule 1  . hydrochlorothiazide (HYDRODIURIL) 12.5 MG tablet Take 12.5 mg by mouth daily.  12  . lidocaine-prilocaine (EMLA) cream Apply 1 application topically as needed. 30 g 0  . Multiple Vitamins-Minerals (ONE DAILY MULTIVITAMIN MEN PO) Take 1 tablet by mouth daily.    Marland Kitchen oxyCODONE-acetaminophen (PERCOCET) 5-325 MG tablet Take 1 tablet by mouth every 6 (six) hours as needed for severe pain (may cause constipation). 60 tablet 0  . promethazine (PHENERGAN) 25 MG tablet Take 1 tablet (25 mg total) by mouth every 6 (six) hours as needed for nausea or vomiting. 30 tablet 3  . tamsulosin (  FLOMAX) 0.4 MG CAPS capsule     . traZODone (DESYREL) 50 MG tablet Take 1 tablet (50 mg total) by mouth at bedtime. 30 tablet 1   No current facility-administered medications for this visit.    Facility-Administered Medications Ordered in Other Visits  Medication Dose Route Frequency Provider Last Rate Last Dose  . sodium chloride flush (NS) 0.9 % injection 10 mL  10 mL Intracatheter PRN Wyatt Portela, MD   10 mL at 05/17/18 8144     Allergies: No Known Allergies  Past Medical History, Surgical history, Social  history, and Family History were reviewed and updated.   Physical Exam:    Blood pressure (!) 93/59, pulse 84, temperature 98.2 F (36.8 C), temperature source Oral, resp. rate 18, height 5\' 10"  (1.778 m), weight 241 lb 11.2 oz (109.6 kg), SpO2 98 %.     ECOG: 0    General appearance: Comfortable appearing without any discomfort Head: Normocephalic without any trauma Oropharynx: Mucous membranes are moist and pink without any thrush or ulcers. Eyes: Pupils are equal and round reactive to light. Lymph nodes: No cervical, supraclavicular, inguinal or axillary lymphadenopathy.   Heart:regular rate and rhythm.  S1 and S2 without leg edema. Lung: Clear without any rhonchi or wheezes.  No dullness to percussion. Abdomin: Soft, nontender, nondistended with good bowel sounds.  No hepatosplenomegaly. Musculoskeletal: Erythema noted on his right foot with discoloration and warmth noted.  He has good pulses in his right foot. Neurological: No deficits noted on motor, sensory and deep tendon reflex exam. Skin: No petechial rash or dryness.  Appeared moist.          Lab Results: Lab Results  Component Value Date   WBC 7.3 04/26/2018   HGB 10.2 (L) 04/26/2018   HCT 32.3 (L) 04/26/2018   MCV 100.3 (H) 04/26/2018   PLT 202 04/26/2018     Chemistry      Component Value Date/Time   NA 140 04/26/2018 0835   K 3.7 04/26/2018 0835   CL 107 04/26/2018 0835   CO2 24 04/26/2018 0835   BUN 16 04/26/2018 0835   CREATININE 0.99 04/26/2018 0835      Component Value Date/Time   CALCIUM 8.4 (L) 04/26/2018 0835   ALKPHOS 65 04/26/2018 0835   AST 18 04/26/2018 0835   ALT 20 04/26/2018 0835   BILITOT 0.5 04/26/2018 0835     Results for Dennis Patel, Dennis Patel (MRN [id number]) as of 05/17/2018 09:10  Ref. Range 04/05/2018 08:14 04/26/2018 08:35  Prostate Specific Ag, Serum Latest Ref Range: 0.0 - 4.0 ng/mL 7.5 (H) 6.3 (H)        Impression and Plan:   68 year old Patel with:  1.     Castration-sensitive prostate cancer with disease to the bone diagnosed in November 2019.   He is currently receiving Taxotere chemotherapy without any major complications.  Risks and benefits of proceeding with cycle 6 today was discussed.  These complications occluding nausea, vomiting, myelosuppression and potential neutropenic sepsis.   After discussion today, I elected to withhold Taxotere chemotherapy given the dermatological toxicity he is experiencing and worsening edema.  We will restage him with a CT scan and a bone scan in the immediate future.   2.  Androgen deprivation therapy: He is currently receiving Lupron under the care of Dr. Junious Silk I recommended continuing for the time being.   3.  Bone directed therapy: Delton See will be considered after obtaining dental clearance.  I recommended calcium and vitamin D supplements  for the time being..   4.  Bone pain: I will change him to oxycodone to decrease his risk of increased on oral intake.  5.  IV access: Port-A-Cath continues to be in use without any issues.  6.  Antiemetics: He has been using Phenergan with reasonable success at this time.  7.  Neutropenia prophylaxis: He continues to receive growth factor support after each cycle of therapy given his high risk of neutropenia.  8.  Prognosis and goals of care: Therapy remains palliative although aggressive therapy is warranted given his excellent performance status.  9.  Right foot erythema and blistering: This is related to Taxotere chemotherapy although superimposed infection is a possibility.  Discontinuation of Taxotere should help but I will add a course of antibiotic to treat for cellulitis utilizing doxycycline.  10.  Neuropathy: He is currently on Neurontin without any exacerbation noted.  11.  Follow-up: In 3 to 4 weeks to follow his progress.  25 minutes was spent with the patient face-to-face today.  More than 50% of time was spent on discussing his disease  status, laboratory review, answer questions about future plan of care and addressing complications to therapy.   Zola Button, MD 4/30/20209:08 AM

## 2018-05-17 NOTE — Patient Instructions (Signed)

## 2018-05-18 ENCOUNTER — Other Ambulatory Visit: Payer: Self-pay

## 2018-05-18 ENCOUNTER — Telehealth: Payer: Self-pay

## 2018-05-18 LAB — PROSTATE-SPECIFIC AG, SERUM (LABCORP): Prostate Specific Ag, Serum: 4.4 ng/mL — ABNORMAL HIGH (ref 0.0–4.0)

## 2018-05-18 MED ORDER — FUROSEMIDE 20 MG PO TABS: 40.0000 mg | ORAL_TABLET | Freq: Every day | ORAL | 0 refills | Status: DC

## 2018-05-18 NOTE — Telephone Encounter (Signed)
Contacted patient and made aware of PSA results. 

## 2018-05-18 NOTE — Telephone Encounter (Signed)
-----   Message from Wyatt Portela, MD sent at 05/18/2018  8:31 AM EDT ----- Please let him know his PSA is down.

## 2018-05-19 ENCOUNTER — Inpatient Hospital Stay: Payer: Medicare Other

## 2018-05-21 ENCOUNTER — Telehealth: Payer: Self-pay | Admitting: Oncology

## 2018-05-21 NOTE — Telephone Encounter (Signed)
Scheduled lab and f/u for CT scan. Called and spoke with patient. Confirmed dates and times

## 2018-05-25 ENCOUNTER — Telehealth: Payer: Self-pay

## 2018-05-25 NOTE — Telephone Encounter (Signed)
-----   Message from Wyatt Portela, MD sent at 05/25/2018 10:59 AM EDT ----- Keep MD visit as is. Thanks.  ----- Message ----- From: Tami Lin, RN Sent: 05/25/2018  10:30 AM EDT To: Wyatt Portela, MD  Patient has CT C/A/P on 05/30/18 and visit with you on 5/15. He is not able to have the whole body scan done until 06/05/18. Do you want to reschedule office visit to after 5/19 or go ahead as scheduled 5/15 to review CT results? Lanelle Bal

## 2018-05-25 NOTE — Telephone Encounter (Signed)
Patient aware of CT scan on 05/30/18 with lab prior and Whole Body Scan scheduled for 06/05/18 at 9:00 am with return at 12:00. Patient verbalized understanding of all appointments.

## 2018-05-30 ENCOUNTER — Ambulatory Visit (HOSPITAL_COMMUNITY)
Admission: RE | Admit: 2018-05-30 | Discharge: 2018-05-30 | Disposition: A | Discharge status: Home / Self Care | Payer: Medicare Other | Source: Ambulatory Visit | Attending: Oncology | Admitting: Oncology

## 2018-05-30 ENCOUNTER — Other Ambulatory Visit: Payer: Self-pay

## 2018-05-30 ENCOUNTER — Inpatient Hospital Stay: Payer: Medicare Other | Attending: Oncology

## 2018-05-30 DIAGNOSIS — C61 Malignant neoplasm of prostate: Secondary | ICD-10-CM

## 2018-05-30 DIAGNOSIS — Z79899 Other long term (current) drug therapy: Secondary | ICD-10-CM | POA: Diagnosis not present

## 2018-05-30 DIAGNOSIS — R609 Edema, unspecified: Secondary | ICD-10-CM | POA: Diagnosis not present

## 2018-05-30 DIAGNOSIS — Z7982 Long term (current) use of aspirin: Secondary | ICD-10-CM | POA: Diagnosis not present

## 2018-05-30 DIAGNOSIS — C7951 Secondary malignant neoplasm of bone: Secondary | ICD-10-CM | POA: Insufficient documentation

## 2018-05-30 DIAGNOSIS — G629 Polyneuropathy, unspecified: Secondary | ICD-10-CM | POA: Insufficient documentation

## 2018-05-30 DIAGNOSIS — Z191 Hormone sensitive malignancy status: Secondary | ICD-10-CM | POA: Insufficient documentation

## 2018-05-30 DIAGNOSIS — Z9221 Personal history of antineoplastic chemotherapy: Secondary | ICD-10-CM | POA: Insufficient documentation

## 2018-05-30 LAB — CBC WITH DIFFERENTIAL (CANCER CENTER ONLY)
Abs Immature Granulocytes: 0.03 10*3/uL (ref 0.00–0.07)
Basophils Absolute: 0.1 10*3/uL (ref 0.0–0.1)
Basophils Relative: 1 %
Eosinophils Absolute: 0.2 10*3/uL (ref 0.0–0.5)
Eosinophils Relative: 3 %
HCT: 40.5 % (ref 39.0–52.0)
Hemoglobin: 12.4 g/dL — ABNORMAL LOW (ref 13.0–17.0)
Immature Granulocytes: 1 %
Lymphocytes Relative: 16 %
Lymphs Abs: 1 10*3/uL (ref 0.7–4.0)
MCH: 30.8 pg (ref 26.0–34.0)
MCHC: 30.6 g/dL (ref 30.0–36.0)
MCV: 100.7 fL — ABNORMAL HIGH (ref 80.0–100.0)
Monocytes Absolute: 0.7 10*3/uL (ref 0.1–1.0)
Monocytes Relative: 11 %
Neutro Abs: 4.5 10*3/uL (ref 1.7–7.7)
Neutrophils Relative %: 68 %
Platelet Count: 299 10*3/uL (ref 150–400)
RBC: 4.02 MIL/uL — ABNORMAL LOW (ref 4.22–5.81)
RDW: 16.1 % — ABNORMAL HIGH (ref 11.5–15.5)
WBC Count: 6.4 10*3/uL (ref 4.0–10.5)
nRBC: 0 % (ref 0.0–0.2)

## 2018-05-30 LAB — CMP (CANCER CENTER ONLY)
ALT: 23 U/L (ref 0–44)
AST: 20 U/L (ref 15–41)
Albumin: 3.6 g/dL (ref 3.5–5.0)
Alkaline Phosphatase: 75 U/L (ref 38–126)
Anion gap: 8 (ref 5–15)
BUN: 12 mg/dL (ref 8–23)
CO2: 24 mmol/L (ref 22–32)
Calcium: 8.4 mg/dL — ABNORMAL LOW (ref 8.9–10.3)
Chloride: 108 mmol/L (ref 98–111)
Creatinine: 0.96 mg/dL (ref 0.61–1.24)
GFR, Est AFR Am: >60 mL/min (ref >60)
GFR, Estimated: >60 mL/min (ref >60)
Glucose, Bld: 150 mg/dL — ABNORMAL HIGH (ref 70–99)
Potassium: 4.3 mmol/L (ref 3.5–5.1)
Sodium: 140 mmol/L (ref 135–145)
Total Bilirubin: 0.7 mg/dL (ref 0.3–1.2)
Total Protein: 6.1 g/dL — ABNORMAL LOW (ref 6.5–8.1)

## 2018-05-30 MED ORDER — SODIUM CHLORIDE (PF) 0.9 % IJ SOLN
INTRAMUSCULAR | Status: AC
  Filled 2018-05-30: qty 50

## 2018-05-30 MED ORDER — IOHEXOL 300 MG/ML  SOLN
100.0000 mL | Freq: Once | INTRAMUSCULAR | Status: AC | PRN
  Administered 2018-05-30: 12:00:00 100 mL via INTRAVENOUS

## 2018-05-31 ENCOUNTER — Other Ambulatory Visit: Payer: Self-pay | Admitting: Oncology

## 2018-05-31 LAB — PROSTATE-SPECIFIC AG, SERUM (LABCORP): Prostate Specific Ag, Serum: 4.8 ng/mL — ABNORMAL HIGH (ref 0.0–4.0)

## 2018-06-01 ENCOUNTER — Inpatient Hospital Stay: Payer: Medicare Other | Admitting: Oncology

## 2018-06-01 ENCOUNTER — Telehealth: Payer: Self-pay

## 2018-06-01 ENCOUNTER — Other Ambulatory Visit: Payer: Self-pay

## 2018-06-01 ENCOUNTER — Telehealth: Payer: Self-pay | Admitting: Pharmacist

## 2018-06-01 VITALS — BP 140/63 | HR 64 | Temp 98.5°F | Resp 17 | Ht 70.0 in | Wt 233.8 lb

## 2018-06-01 DIAGNOSIS — Z191 Hormone sensitive malignancy status: Secondary | ICD-10-CM

## 2018-06-01 DIAGNOSIS — Z7982 Long term (current) use of aspirin: Secondary | ICD-10-CM

## 2018-06-01 DIAGNOSIS — G629 Polyneuropathy, unspecified: Secondary | ICD-10-CM | POA: Diagnosis not present

## 2018-06-01 DIAGNOSIS — C61 Malignant neoplasm of prostate: Secondary | ICD-10-CM | POA: Diagnosis not present

## 2018-06-01 DIAGNOSIS — Z9221 Personal history of antineoplastic chemotherapy: Secondary | ICD-10-CM | POA: Diagnosis not present

## 2018-06-01 DIAGNOSIS — C7951 Secondary malignant neoplasm of bone: Secondary | ICD-10-CM

## 2018-06-01 DIAGNOSIS — R609 Edema, unspecified: Secondary | ICD-10-CM

## 2018-06-01 DIAGNOSIS — Z79899 Other long term (current) drug therapy: Secondary | ICD-10-CM

## 2018-06-01 MED ORDER — ABIRATERONE ACETATE 250 MG PO TABS: 1000.0000 mg | ORAL_TABLET | Freq: Every day | ORAL | 0 refills | Status: DC

## 2018-06-01 MED ORDER — PREDNISONE 5 MG PO TABS: 5.0000 mg | ORAL_TABLET | Freq: Every day | ORAL | 3 refills | Status: DC

## 2018-06-01 NOTE — Telephone Encounter (Signed)
Oral Oncology Pharmacist Encounter  Received new prescription for Zytiga (abiraterone) for the treatment of high risk, metastatic, castration resistant prostate cancer in conjunction with prednisone and androgen deprivation therapy Mills Koller), planned duration until disease progression or unacceptable toxicity.  Original diagnosis of metastatic, castration sensitive disease in November 2019. Patient received docetaxel chemotherapy x5 cycles December 2019-April 2020. Cycle 6 was held due to complications associated with chemotherapy administration. PSA is slightly rising. Recent scans show progressive disease. Patient is under evaluation to initiate Zytiga at 1000 mg once daily with prednisone 5 mg once daily. Planned start date is 06/11/2018  Labs from 05/30/2018 assessed, okay for treatment initiation. BPs in EMR reviewed, most readings WNL, will continue to be monitored  Current medication list in Epic reviewed,no  DDIs with Zytiga identified.  Prescriptions for Zytiga and prednisone have been e-scribed to the [facility] for benefits analysis and approval by MD.  Oral Oncology Clinic will continue to follow for insurance authorization, copayment issues, initial counseling and start date.  Johny Drilling, PharmD, BCPS, BCOP  06/01/2018 9:08 AM Oral Oncology Clinic [phone]

## 2018-06-01 NOTE — Telephone Encounter (Signed)
Oral Oncology Patient Advocate Encounter  Received notification from [facility] that prior authorization for Dennis Patel is required.  PA submitted on CoverMyMeds Key [id number] Status is pending  Oral Oncology Clinic will continue to follow.  [name] Phone [phone] Fax [phone] 06/01/2018    10:58 AM

## 2018-06-01 NOTE — Telephone Encounter (Signed)
Oral Oncology Patient Advocate Encounter  Prior Authorization for Fabio Asa has been approved.    PA# [id number] Effective dates: 06/01/18 through 06/01/19  Oral Oncology Clinic will continue to follow.   [place] Patient [place] Phone [phone] Fax [phone] 06/01/2018    12:00 PM

## 2018-06-01 NOTE — Progress Notes (Signed)
Hematology and Oncology Follow Up Visit  Dennis Patel [id number] [birth year] 68 y.o. 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  He has predominantly disease to bone with Gleason score 4+4 = 8 and a PSA of 48 at time of diagnosis.  Prior Therapy:  Prostate biopsy obtained on December 05, 2017 showed a Gleason score of 4+4 = 8.  Taxotere 75 mg/m every 3 weeks started on January 31, 2018.  He completed 5 cycles of therapy in April 2020.  Current therapy:  Androgen deprivation therapy under the care of Dr. Junious Silk in the form of Mills Koller started in December 2019.    Interim History: Dennis Patel is here for a follow-up visit.  Since the last visit, he completed 5 cycles of chemotherapy with few complications.  Since discontinuation of chemotherapy he reports improvement in his overall health with resolution of nausea and fatigue.  His neuropathy is also improved.  His lower extremity edema is improving slowly and completed 2 weeks of antibiotics for his cellulitis.  No fevers, chills or sweats.  He is ambulating with the help of a cane without any recent falls or syncope.   He denied headaches, blurry vision, syncope or seizures.  Denies any fevers, chills or sweats.  Denied chest pain, palpitation, orthopnea or leg edema.  Denied cough, wheezing or hemoptysis.  Denied nausea, vomiting or abdominal pain.  Denies any constipation or diarrhea.  Denies any frequency urgency or hesitancy.  Denies any arthralgias or myalgias.  Denies any skin rashes or lesions.  Denies any bleeding or clotting tendency.  Denies any easy bruising.  Denies any hair or nail changes.  Denies any anxiety or depression.  Remaining review of system is negative.            Medications: I have reviewed the patient's current medications.  Current Outpatient Medications  Medication Sig  Dispense Refill  . acetaminophen (TYLENOL) 325 MG tablet Take 650 mg by mouth every 6 (six) hours as needed.    Marland Kitchen amLODipine (NORVASC) 10 MG tablet Take 1 tablet (10 mg total) by mouth daily. 30 tablet 0  . aspirin EC 81 MG tablet Take 81 mg by mouth daily.    Marland Kitchen atorvastatin (LIPITOR) 10 MG tablet Take 1 tablet (10 mg total) by mouth daily. 30 tablet 11  . calcium-vitamin D (OSCAL WITH D) 250-125 MG-UNIT tablet Take 1 tablet by mouth daily.    . clopidogrel (PLAVIX) 75 MG tablet Take 1 tablet (75 mg total) by mouth daily. 30 tablet 11  . doxycycline (VIBRAMYCIN) 50 MG capsule Take 1 capsule (50 mg total) by mouth 2 (two) times daily. 28 capsule 0  . famotidine (PEPCID) 10 MG tablet Take 10 mg by mouth daily.    . furosemide (LASIX) 20 MG tablet TAKE 1 TABLET BY MOUTH EVERY DAY 30 tablet 0  . gabapentin (NEURONTIN) 300 MG capsule Take 1 capsule (300 mg total) by mouth at bedtime. 90 capsule 1  . hydrochlorothiazide (HYDRODIURIL) 12.5 MG tablet Take 12.5 mg by mouth daily.  12  . lidocaine-prilocaine (EMLA) cream Apply 1 application topically as needed. 30 g 0  . Multiple Vitamins-Minerals (ONE DAILY MULTIVITAMIN MEN PO) Take 1 tablet by mouth daily.    Marland Kitchen oxyCODONE (OXY IR/ROXICODONE) 5 MG immediate release tablet Take 1 tablet (5 mg total) by mouth every 4 (four) hours as needed for severe pain. 90 tablet 0  . promethazine (  PHENERGAN) 25 MG tablet Take 1 tablet (25 mg total) by mouth every 6 (six) hours as needed for nausea or vomiting. 30 tablet 3  . tamsulosin (FLOMAX) 0.4 MG CAPS capsule     . traZODone (DESYREL) 50 MG tablet Take 1 tablet (50 mg total) by mouth at bedtime. 30 tablet 1   No current facility-administered medications for this visit.      Allergies: No Known Allergies  Past Medical History, Surgical history, Social history, and Family History were reviewed and updated.   Physical Exam:     Blood pressure 140/63, pulse 64, temperature 98.5 F (36.9 C), temperature  source Oral, resp. rate 17, height 5\' 10"  (1.778 m), weight 233 lb 12.8 oz (106.1 kg), SpO2 100 %.     ECOG: 0    General appearance: Alert, awake without any distress. Head: Atraumatic without abnormalities Oropharynx: Without any thrush or ulcers. Eyes: No scleral icterus. Lymph nodes: No lymphadenopathy noted in the cervical, supraclavicular, or axillary nodes Heart:regular rate and rhythm, without any murmurs or gallops.   Lung: Clear to auscultation without any rhonchi, wheezes or dullness to percussion. Abdomin: Soft, nontender without any shifting dullness or ascites. Musculoskeletal: No clubbing or cyanosis. Neurological: No motor or sensory deficits. Skin: Mild erythema noted on his right foot.  No ulcers or lesions.  Skin peeling noted.          Lab Results: Lab Results  Component Value Date   WBC 6.4 05/30/2018   HGB 12.4 (L) 05/30/2018   HCT 40.5 05/30/2018   MCV 100.7 (H) 05/30/2018   PLT 299 05/30/2018     Chemistry      Component Value Date/Time   NA 140 05/30/2018 0940   K 4.3 05/30/2018 0940   CL 108 05/30/2018 0940   CO2 24 05/30/2018 0940   BUN 12 05/30/2018 0940   CREATININE 0.96 05/30/2018 0940      Component Value Date/Time   CALCIUM 8.4 (L) 05/30/2018 0940   ALKPHOS 75 05/30/2018 0940   AST 20 05/30/2018 0940   ALT 23 05/30/2018 0940   BILITOT 0.7 05/30/2018 0940       Results for Dennis, Patel (MRN [id number]) as of 06/01/2018 07:47  Ref. Range 05/17/2018 08:46 05/30/2018 09:40  Prostate Specific Ag, Serum Latest Ref Range: 0.0 - 4.0 ng/mL 4.4 (H) 4.8 (H)    IMPRESSION: Progressive multifocal osseous metastases throughout the visualized axial and appendicular skeleton.  New inflammatory fibrosis in the lungs bilaterally, including an irregular nodular lesion at the right lung base, favoring organizing pneumonia (possibly related to drug toxicity) versus resolving atypical infection. Consider follow-up CT chest in 3  months.    Impression and Plan:   68 year old man with:  1.    Advanced prostate cancer diagnosed in November 2019.  He has castration-sensitive disease at the time of presentation.   He completed 5 cycles of Taxotere chemotherapy with a 6 cycles withheld due to complications including dermatological toxicity and neuropathy.  His PSA nadir was around 4.4 currently at 4.8.  His CT scan obtained on May 30, 2018 was personally reviewed and showed no visceral metastasis but suggestive of progressive multifocal metastatic disease in the bone.  Bone scan is currently pending.  Risks and benefits of starting additional therapy in the form of Zytiga or Gillermina Phy was discussed today.  Complication associated with these medications were reviewed.  After discussion today he is agreeable to proceed with Zytiga.  Complications specifically associated with Zytiga include fatigue, hypertension,  edema, hypokalemia and adrenal insufficiency.  The plan is to start at 1000 mg daily with prednisone 5 mg daily starting on Jun 11, 2018.  Plan is to give him a few more weeks of rest and recovery prior to start this therapy.   2.  Androgen deprivation therapy: I recommended continuing long-term androgen deprivation which she is currently receiving under the care of Dr. Junious Silk.   3.  Bone directed therapy: I recommended continuing calcium and vitamin D supplements.  Delton See has been given in the past under the care of Dr. Junious Silk which I recommended continuing.   4.  Bone pain: Very little noted and he has been using oxycodone much less.  5.  IV access: Port-A-Cath this will be flushed periodically with each visit.  6.  Nausea: Resolved at this time.   7.  Prognosis and goals of care: His disease is incurable but aggressive therapy is warranted given his excellent performance status.  9.  Right foot erythema and blistering: Improving slowly after completing 2-week course of doxycycline.  10.  Neuropathy:  Has been manageable with neuropathy and anticipate improvement after discontinuation of chemotherapy.  11.  Follow-up: In 6 weeks to follow his progress.  25 minutes was spent with the patient face-to-face today.  More than 50% of time was dedicated to reviewing his disease status, treatment options and complications related to these therapies.   Zola Button, MD 5/15/20208:16 AM

## 2018-06-04 ENCOUNTER — Telehealth: Payer: Self-pay | Admitting: Oncology

## 2018-06-04 ENCOUNTER — Telehealth: Payer: Self-pay

## 2018-06-04 NOTE — Telephone Encounter (Signed)
Called regarding schedule °

## 2018-06-04 NOTE — Telephone Encounter (Signed)
Oral Oncology Patient Advocate Encounter  Zytiga copay is [phone], this is not affordable for the patient.  I called and spoke to the patient about copay assistance options, he agreed to apply for manufacturer assistance. I completed the application for Wynetta Emery and [facility] in an effort to reduce patient's out of pocket expense for Zytiga to $0.    Application completed and faxed to [phone].   [employer] patient assistance phone number for follow up is [phone].   This encounter will be updated until final determination.  [place] Patient [place] Phone [phone] Fax [phone] 06/04/2018    11:20 AM

## 2018-06-04 NOTE — Telephone Encounter (Signed)
Oral Oncology Patient Advocate Encounter  [employer] sent me a notification that more information was needed to complete the application for Zytiga.  I called [employer] and they said that because the patient files a tax return we must submit that to them.   I called the patient and gave him this information. He emailed me the return and I will fax it when I get back in the office tomorrow.  This encounter will be updated until final determination.  [place] Patient [place] Phone [phone] Fax [phone] 06/04/2018   4:12 PM

## 2018-06-05 ENCOUNTER — Ambulatory Visit (HOSPITAL_COMMUNITY)
Admission: RE | Admit: 2018-06-05 | Discharge: 2018-06-05 | Disposition: A | Discharge status: Home / Self Care | Payer: Medicare Other | Source: Ambulatory Visit | Attending: Oncology | Admitting: Oncology

## 2018-06-05 ENCOUNTER — Other Ambulatory Visit: Payer: Self-pay

## 2018-06-05 DIAGNOSIS — C61 Malignant neoplasm of prostate: Secondary | ICD-10-CM

## 2018-06-05 DIAGNOSIS — C412 Malignant neoplasm of vertebral column: Secondary | ICD-10-CM | POA: Diagnosis not present

## 2018-06-05 MED ORDER — TECHNETIUM TC 99M MEDRONATE IV KIT
20.0000 | PACK | Freq: Once | INTRAVENOUS | Status: AC | PRN
  Administered 2018-06-05: 20 via INTRAVENOUS

## 2018-06-05 NOTE — Telephone Encounter (Signed)
Oral Oncology Patient Advocate Encounter  I called [employer] and followed up on the [place] patient assistance application. They did receive the tax return and it is now in processing. This could take 2-3 business days and we will receive a fax with final determination.  This encounter will be updated until final determination.  [place] Patient [place] Phone [phone] Fax [phone] 06/05/2018   2:04 PM

## 2018-06-05 NOTE — Telephone Encounter (Signed)
Oral Oncology Patient Advocate Encounter  I faxed the tax return to [employer] today.  This encounter will be updated until final determination.  [place] Patient [place] Phone [phone] Fax [phone] 06/05/2018   8:21 AM

## 2018-06-06 ENCOUNTER — Telehealth: Payer: Self-pay | Admitting: *Deleted

## 2018-06-06 NOTE — Telephone Encounter (Signed)
Notified of message below

## 2018-06-06 NOTE — Telephone Encounter (Signed)
-----   Message from Wyatt Portela, MD sent at 06/06/2018 10:32 AM EDT ----- Please let him know his bone scan is good. No new cancer.

## 2018-06-07 MED ORDER — PREDNISONE 5 MG PO TABS: 5.0000 mg | ORAL_TABLET | Freq: Every day | ORAL | 3 refills | Status: DC

## 2018-06-07 NOTE — Telephone Encounter (Signed)
Oral Oncology Patient Advocate Encounter  Received notification from [place] and [place] Patient Assistance program that patient has been successfully enrolled into their program to receive Zytiga from the manufacturer at $0 out of pocket until 08/05/18.   The patient will need to sign a medicare attestation form in order for the application to be approved through the end of the year.   The patient sent me an email last night and said that he has received a call from [place] but could not make out what the person was saying. I emailed him the information that I had, steps he needed to take to get his Zytiga shipped to him as well as how and when to call for a refill. I attached the medicare attestation to this email for him to sign and send back.   Patient knows to call or email with questions or concerns.   Oral Oncology Clinic will continue to follow.  [place] Patient [place] Phone [phone] Fax [phone] 06/07/2018    8:10 AM

## 2018-06-07 NOTE — Telephone Encounter (Signed)
Oral Chemotherapy Pharmacist Encounter   I spoke with patient for overview of: Zytiga (abiraterone) for the treatment of high risk, metastatic, castration resistant prostate cancer in conjunction with prednisone and androgen deprivation therapy Mills Koller), planned duration until disease progression or unacceptable toxicity.   Counseled patient on administration, dosing, side effects, monitoring, drug-food interactions, safe handling, storage, and disposal.  Patient will take Zytiga 250mg  tablets, 4 tablets (1000mg ) by mouth once daily on an empty stomach, 1 hour before or 2 hours after a meal.  Patient states he will take his Zyitga 1st thing in the morning and will wait at least 1 hour before eating.  Patient will take prednisone 5mg  tablet, 1 tablet by mouth one daily with breakfast.  Zytiga start date: TBD, pending medication acquisition  Adverse effects include but are not limited to: peripheral edema, GI upset, hypertension, hot flashes, fatigue, and arthralgias.  Patient complains of significant reduction in endurance since chemotherapy. He also has significant lower extremity edema, right greater than left, that has plateaued, but is improved since coming off of docetaxel.    Prednisone prescription originally sent to [facility]. It has now been sent to [facility] on [address]. Patient will obtain prednisone and knows to start prednisone on the same day as Zytiga start.  Reviewed with patient importance of keeping a medication schedule and plan for any missed doses.  Medication reconciliation performed and medication/allergy list updated.  Insurance authorization for Fabio Asa has been obtained. Copayment was prohibitively expensive. He has been enrolled to receive medication at $0 out of pocket cost from the manufacturer. He should be receiving his 1st shipment of Zytiga from patient assistance program pharmacy [facility]) in the next week or so.  All  questions answered.  Mr. Aderman voiced understanding and appreciation.   Patient knows to call the office with questions or concerns.   Johny Drilling, PharmD, BCPS, BCOP  06/07/2018 8:57 AM Oral Oncology Clinic [phone]

## 2018-06-07 NOTE — Telephone Encounter (Signed)
Oral Oncology Patient Advocate Encounter  I faxed the completed Medicare attestation form to [facility] and [place] today.  [place] Patient [place] Phone [phone] Fax [phone] 06/07/2018   9:24 AM

## 2018-06-11 ENCOUNTER — Other Ambulatory Visit: Payer: Self-pay | Admitting: Oncology

## 2018-06-12 NOTE — Telephone Encounter (Signed)
Oral Oncology Patient Advocate Encounter  I called Wynetta Emery and [employer] Emer[employer] to check on the [employer] form. They did receive it and the patient has been approved to receive his Zytiga from them until 01/17/19.  [place] Patient [place] Phone [phone] Fax [phone] 06/12/2018   2:25 PM

## 2018-06-12 NOTE — Telephone Encounter (Signed)
Oral Oncology Patient Advocate Encounter  I confirmed with the patient that he received his Zytiga Friday 5/22 and he started taking it on Monday, 5/25.  Dennis Patel is being filled through [employer] and [place] representative, [employer].  [place] Patient [place] Phone [phone] Fax [phone] 06/12/2018   2:16 PM

## 2018-06-12 NOTE — Telephone Encounter (Signed)
Oral Oncology Patient Advocate Encounter  I called Wynetta Emery and [employer]etta Emer[employer] to make sure they have received the medicare attestation form that I faxed to them on 5/21. The representative said she did not see it on file. I refaxed the medicare attestation form today, 5/26. I was advised to call back in an hour to see if it was received.  This encounter will continue to be updated until final determination.  [place] Patient [place] Phone [phone] Fax [phone] 06/12/2018   12:08 PM

## 2018-06-18 ENCOUNTER — Other Ambulatory Visit: Payer: Self-pay

## 2018-06-18 MED ORDER — ABIRATERONE ACETATE 250 MG PO TABS: 1000.0000 mg | ORAL_TABLET | Freq: Every day | ORAL | 0 refills | Status: DC

## 2018-06-25 ENCOUNTER — Other Ambulatory Visit: Payer: Self-pay | Admitting: Oncology

## 2018-06-25 MED ORDER — OXYCODONE HCL 5 MG PO TABS: 5.0000 mg | ORAL_TABLET | ORAL | 0 refills | Status: DC | PRN

## 2018-07-06 ENCOUNTER — Other Ambulatory Visit: Payer: Self-pay | Admitting: Oncology

## 2018-07-11 ENCOUNTER — Other Ambulatory Visit: Payer: Self-pay | Admitting: Oncology

## 2018-07-16 ENCOUNTER — Telehealth: Payer: Self-pay

## 2018-07-16 NOTE — Telephone Encounter (Signed)
Called and left voicemail regarding pre-screening questions for appt on 6/30 

## 2018-07-17 ENCOUNTER — Inpatient Hospital Stay: Payer: Medicare Other

## 2018-07-17 ENCOUNTER — Inpatient Hospital Stay: Payer: Medicare Other | Attending: Oncology

## 2018-07-17 ENCOUNTER — Other Ambulatory Visit: Payer: Self-pay

## 2018-07-17 DIAGNOSIS — C61 Malignant neoplasm of prostate: Secondary | ICD-10-CM | POA: Diagnosis not present

## 2018-07-17 DIAGNOSIS — Z95828 Presence of other vascular implants and grafts: Secondary | ICD-10-CM

## 2018-07-17 LAB — CMP (CANCER CENTER ONLY)
ALT: 31 U/L (ref 0–44)
AST: 21 U/L (ref 15–41)
Albumin: 4 g/dL (ref 3.5–5.0)
Alkaline Phosphatase: 82 U/L (ref 38–126)
Anion gap: 13 (ref 5–15)
BUN: 14 mg/dL (ref 8–23)
CO2: 27 mmol/L (ref 22–32)
Calcium: 9.3 mg/dL (ref 8.9–10.3)
Chloride: 102 mmol/L (ref 98–111)
Creatinine: 1.08 mg/dL (ref 0.61–1.24)
GFR, Est AFR Am: >60 mL/min (ref >60)
GFR, Estimated: >60 mL/min (ref >60)
Glucose, Bld: 193 mg/dL — ABNORMAL HIGH (ref 70–99)
Potassium: 3.4 mmol/L — ABNORMAL LOW (ref 3.5–5.1)
Sodium: 142 mmol/L (ref 135–145)
Total Bilirubin: 0.5 mg/dL (ref 0.3–1.2)
Total Protein: 6.6 g/dL (ref 6.5–8.1)

## 2018-07-17 LAB — CBC WITH DIFFERENTIAL (CANCER CENTER ONLY)
Abs Immature Granulocytes: 0.02 10*3/uL (ref 0.00–0.07)
Basophils Absolute: 0 10*3/uL (ref 0.0–0.1)
Basophils Relative: 0 %
Eosinophils Absolute: 0.1 10*3/uL (ref 0.0–0.5)
Eosinophils Relative: 1 %
HCT: 39.8 % (ref 39.0–52.0)
Hemoglobin: 13.4 g/dL (ref 13.0–17.0)
Immature Granulocytes: 0 %
Lymphocytes Relative: 15 %
Lymphs Abs: 1.1 10*3/uL (ref 0.7–4.0)
MCH: 29.8 pg (ref 26.0–34.0)
MCHC: 33.7 g/dL (ref 30.0–36.0)
MCV: 88.4 fL (ref 80.0–100.0)
Monocytes Absolute: 0.5 10*3/uL (ref 0.1–1.0)
Monocytes Relative: 7 %
Neutro Abs: 5.9 10*3/uL (ref 1.7–7.7)
Neutrophils Relative %: 77 %
Platelet Count: 237 10*3/uL (ref 150–400)
RBC: 4.5 MIL/uL (ref 4.22–5.81)
RDW: 12.7 % (ref 11.5–15.5)
WBC Count: 7.8 10*3/uL (ref 4.0–10.5)
nRBC: 0 % (ref 0.0–0.2)

## 2018-07-17 MED ORDER — HEPARIN SOD (PORK) LOCK FLUSH 100 UNIT/ML IV SOLN
500.0000 [IU] | Freq: Once | INTRAVENOUS | Status: AC | PRN
  Administered 2018-07-17: 500 [IU]
  Filled 2018-07-17: qty 5

## 2018-07-17 MED ORDER — SODIUM CHLORIDE 0.9% FLUSH
10.0000 mL | INTRAVENOUS | Status: DC | PRN
  Administered 2018-07-17: 10 mL
  Filled 2018-07-17: qty 10

## 2018-07-18 ENCOUNTER — Inpatient Hospital Stay: Payer: Medicare Other | Attending: Oncology | Admitting: Oncology

## 2018-07-18 ENCOUNTER — Other Ambulatory Visit: Payer: Self-pay

## 2018-07-18 ENCOUNTER — Telehealth: Payer: Self-pay | Admitting: Oncology

## 2018-07-18 VITALS — BP 131/57 | HR 74 | Temp 99.1°F | Resp 18 | Ht 70.0 in | Wt 226.3 lb

## 2018-07-18 DIAGNOSIS — C7951 Secondary malignant neoplasm of bone: Secondary | ICD-10-CM | POA: Diagnosis not present

## 2018-07-18 DIAGNOSIS — Z79818 Long term (current) use of other agents affecting estrogen receptors and estrogen levels: Secondary | ICD-10-CM | POA: Insufficient documentation

## 2018-07-18 DIAGNOSIS — C61 Malignant neoplasm of prostate: Secondary | ICD-10-CM | POA: Diagnosis not present

## 2018-07-18 DIAGNOSIS — Z7982 Long term (current) use of aspirin: Secondary | ICD-10-CM | POA: Insufficient documentation

## 2018-07-18 DIAGNOSIS — Z79899 Other long term (current) drug therapy: Secondary | ICD-10-CM | POA: Diagnosis not present

## 2018-07-18 LAB — PROSTATE-SPECIFIC AG, SERUM (LABCORP): Prostate Specific Ag, Serum: 1.5 ng/mL (ref 0.0–4.0)

## 2018-07-18 NOTE — Telephone Encounter (Signed)
Scheduled appt per 7/1 los. °Printed calendar and avs. °

## 2018-07-18 NOTE — Progress Notes (Signed)
Hematology and Oncology Follow Up Visit  Dennis Patel [id number] [birth year] 68 y.o. 07/18/2018 11:59 AM Dennis Patel, Dennis Patel, [name], [name], MD   Principle Diagnosis: 68 year old man with advanced prostate cancer with disease to the bone diagnosed in 2019.  He presented with castration-sensitive disease, Gleason score 4+4 = 8 and a PSA of 48.  He has developed castration-resistant disease in May 2020.  Prior Therapy:  Prostate biopsy obtained on December 05, 2017 showed a Gleason score of 4+4 = 8.  Taxotere 75 mg/m every 3 weeks started on January 31, 2018.  He completed 5 cycles of therapy in April 2020.  Current therapy:  Androgen deprivation therapy under the care of Dennis Patel in the form of Dennis Patel started in December 2019.  He will receive Lupron 45 mg every 6 months starting in July 2020.  Zytiga 1000 mg daily with prednisone 5 mg daily started in May 2020.   Interim History: Dennis Patel returns today for a follow-up.  Since the last visit, he started Zytiga with prednisone without any major complaints.  He denies any nausea, fatigue or worsening edema.  He denies any shortness of breath or difficulty breathing.  He denies any changes in his appetite.  His stamina is improving slowly although he is not quite back to baseline.  His right calf and leg still reports some tightness and stiffness in it.  He is ambulating without any difficulties although he does report some sensitivity and pain in his lower extremity especially at nighttime.  He does use oxycodone at times.  He denied any alteration mental status, neuropathy, confusion or dizziness.  Denies any headaches or lethargy.  Denies any night sweats, weight loss or changes in appetite.  Denied orthopnea, dyspnea on exertion or chest discomfort.  Denies shortness of breath, difficulty breathing hemoptysis or cough.  Denies any abdominal distention, nausea, early satiety or dyspepsia.  Denies any hematuria, frequency, dysuria  or nocturia.  Denies any skin irritation, dryness or rash.  Denies any ecchymosis or petechiae.  Denies any lymphadenopathy or clotting.  Denies any heat or cold intolerance.  Denies any anxiety or depression.  Remaining review of system is negative.                 Medications: I have reviewed the patient's current medications.  Current Outpatient Medications  Medication Sig Dispense Refill  . acetaminophen (TYLENOL) 325 MG tablet Take 650 mg by mouth every 6 (six) hours as needed.    Marland Kitchen amLODipine (NORVASC) 10 MG tablet Take 1 tablet (10 mg total) by mouth daily. 30 tablet 0  . aspirin EC 81 MG tablet Take 81 mg by mouth daily.    Marland Kitchen atorvastatin (LIPITOR) 10 MG tablet Take 1 tablet (10 mg total) by mouth daily. 30 tablet 11  . calcium-vitamin D (OSCAL WITH D) 250-125 MG-UNIT tablet Take 1 tablet by mouth daily.    . clopidogrel (PLAVIX) 75 MG tablet Take 1 tablet (75 mg total) by mouth daily. 30 tablet 11  . famotidine (PEPCID) 10 MG tablet Take 10 mg by mouth daily.    Marland Kitchen lidocaine-prilocaine (EMLA) cream Apply 1 application topically as needed. 30 g 0  . Multiple Vitamins-Minerals (ONE DAILY MULTIVITAMIN MEN PO) Take 1 tablet by mouth daily.    Marland Kitchen oxyCODONE (OXY IR/ROXICODONE) 5 MG immediate release tablet Take 1 tablet (5 mg total) by mouth every 4 (four) hours as needed for severe pain. 90 tablet 0  . predniSONE (DELTASONE) 5 MG tablet Take 1 tablet (  5 mg total) by mouth daily with breakfast. 90 tablet 3  . promethazine (PHENERGAN) 25 MG tablet Take 1 tablet (25 mg total) by mouth every 6 (six) hours as needed for nausea or vomiting. 30 tablet 3  . tamsulosin (FLOMAX) 0.4 MG CAPS capsule     . traZODone (DESYREL) 50 MG tablet TAKE 1 TABLET BY MOUTH EVERYDAY AT BEDTIME 90 tablet 1  . ZYTIGA 250 MG tablet TAKE 4 TABLETS BY MOUTH ON AN EMPTY STOMACH ONCE DAILY AS DIRECTED.  TAKE 1 HOUR BEFORE OR 2 HOURS AFTER A MEAL 120 tablet 0   No current facility-administered medications for  this visit.      Allergies: No Known Allergies  Past Medical History, Surgical history, Social history, and Family History were reviewed and updated.   Physical Exam:     Blood pressure (!) 131/57, pulse 74, temperature 99.1 F (37.3 C), temperature source Temporal, resp. rate 18, height 5\' 10"  (1.778 m), weight 226 lb 4.8 oz (102.6 kg), SpO2 97 %.     ECOG: 0    General appearance: Comfortable appearing without any discomfort Head: Normocephalic without any trauma Oropharynx: Mucous membranes are moist and pink without any thrush or ulcers. Eyes: Pupils are equal and round reactive to light. Lymph nodes: No cervical, supraclavicular, inguinal or axillary lymphadenopathy.   Heart:regular rate and rhythm.  S1 and S2.  No edema noted. Lung: Clear without any rhonchi or wheezes.  No dullness to percussion. Abdomin: Soft, nontender, nondistended with good bowel sounds.  No hepatosplenomegaly. Musculoskeletal: No joint deformity or effusion.  Full range of motion noted. Neurological: No deficits noted on motor, sensory and deep tendon reflex exam. Skin: Slight erythema noted in his right foot.           Lab Results: Lab Results  Component Value Date   WBC 7.8 07/17/2018   HGB 13.4 07/17/2018   HCT 39.8 07/17/2018   MCV 88.4 07/17/2018   PLT 237 07/17/2018     Chemistry      Component Value Date/Time   NA 142 07/17/2018 1028   K 3.4 (L) 07/17/2018 1028   CL 102 07/17/2018 1028   CO2 27 07/17/2018 1028   BUN 14 07/17/2018 1028   CREATININE 1.08 07/17/2018 1028      Component Value Date/Time   CALCIUM 9.3 07/17/2018 1028   ALKPHOS 82 07/17/2018 1028   AST 21 07/17/2018 1028   ALT 31 07/17/2018 1028   BILITOT 0.5 07/17/2018 1028      Results for Dennis Patel, Dennis Patel (MRN [id number]) as of 07/18/2018 11:44  Ref. Range 05/30/2018 09:40 07/17/2018 10:28  Prostate Specific Ag, Serum Latest Ref Range: 0.0 - 4.0 ng/mL 4.8 (H) 1.5       Impression and Plan:    68 year old man with:  1.    Castration-resistant prostate cancer active presented with advanced disease and bone involvement in 2019.  He is currently on Zytiga and prednisone and tolerated it without any major complaints.  Risks and benefits of continuing this approach was discussed today.  Complication associated with long-term treatment with Zytiga were reviewed which include edema, hypertension and adrenal insufficiency.  For the time being is agreeable to continue.   2.  Androgen deprivation therapy: He expressed desire to receive a Lupron at the cancer center.  Risks and benefits of Lupron long-term was discussed today.  These complications include weight gain, osteoporosis and sexual dysfunction.  He is willing to proceed and will start Lupron 45 mg every  6 months starting in July.   3.  Bone directed therapy: I recommended calcium and vitamin D supplements.  Resuming Delton See will be considered after obtaining dental clearance.   4.    Pain: Predominantly in his right foot related to complications of chemotherapy.  He uses oxycodone infrequently.  5.  IV access: Port-A-Cath will remain in place and flushed periodically.  6.  Prognosis and goals of care: Therapy remains palliative although aggressive measures are warranted given the next performance status.  7.  Right foot erythema and blistering: Cellulitis has resolved at this time.  Antibiotic has been discontinued.  8.  Follow-up: In August 2020 for repeat evaluation.   25 minutes was spent with the patient face-to-face today.  More than 50% of time was spent on reviewing his disease status, treatment options, complication related therapy and managing complications related to cancer and cancer treatment.   Zola Button, MD 7/1/202011:59 AM

## 2018-07-28 ENCOUNTER — Other Ambulatory Visit: Payer: Self-pay | Admitting: Oncology

## 2018-07-30 ENCOUNTER — Other Ambulatory Visit: Payer: Self-pay

## 2018-07-30 ENCOUNTER — Inpatient Hospital Stay: Payer: Medicare Other

## 2018-07-30 VITALS — BP 144/68 | HR 82 | Temp 98.1°F | Resp 18

## 2018-07-30 DIAGNOSIS — C61 Malignant neoplasm of prostate: Secondary | ICD-10-CM | POA: Diagnosis not present

## 2018-07-30 DIAGNOSIS — Z95828 Presence of other vascular implants and grafts: Secondary | ICD-10-CM

## 2018-07-30 MED ORDER — LEUPROLIDE ACETATE (4 MONTH) 30 MG IM KIT
45.0000 mg | PACK | Freq: Once | INTRAMUSCULAR | Status: AC
  Administered 2018-07-30: 11:00:00 45 mg via INTRAMUSCULAR
  Filled 2018-07-30: qty 60

## 2018-07-30 NOTE — Addendum Note (Signed)
Addended by: Carolynne Edouard B on: 07/30/2018 02:11 PM   Modules accepted: Orders

## 2018-08-08 ENCOUNTER — Other Ambulatory Visit: Payer: Self-pay | Admitting: Vascular Surgery

## 2018-08-11 ENCOUNTER — Other Ambulatory Visit: Payer: Self-pay | Admitting: Vascular Surgery

## 2018-08-13 ENCOUNTER — Telehealth: Payer: Self-pay | Admitting: *Deleted

## 2018-08-13 ENCOUNTER — Other Ambulatory Visit: Payer: Self-pay | Admitting: Oncology

## 2018-08-13 NOTE — Telephone Encounter (Signed)
Mr Mott left a message requesting a refill of oxycodone 5 mg

## 2018-08-14 ENCOUNTER — Other Ambulatory Visit: Payer: Self-pay | Admitting: Oncology

## 2018-08-14 MED ORDER — OXYCODONE HCL 5 MG PO TABS: 5.0000 mg | ORAL_TABLET | ORAL | 0 refills | Status: DC | PRN

## 2018-08-24 ENCOUNTER — Other Ambulatory Visit: Payer: Self-pay | Admitting: Vascular Surgery

## 2018-08-28 ENCOUNTER — Inpatient Hospital Stay: Payer: Medicare Other | Attending: Oncology

## 2018-08-28 ENCOUNTER — Other Ambulatory Visit: Payer: Self-pay

## 2018-08-28 DIAGNOSIS — Z192 Hormone resistant malignancy status: Secondary | ICD-10-CM | POA: Diagnosis not present

## 2018-08-28 DIAGNOSIS — Z79899 Other long term (current) drug therapy: Secondary | ICD-10-CM | POA: Insufficient documentation

## 2018-08-28 DIAGNOSIS — C61 Malignant neoplasm of prostate: Secondary | ICD-10-CM | POA: Diagnosis not present

## 2018-08-28 DIAGNOSIS — G629 Polyneuropathy, unspecified: Secondary | ICD-10-CM | POA: Insufficient documentation

## 2018-08-28 DIAGNOSIS — Z7982 Long term (current) use of aspirin: Secondary | ICD-10-CM | POA: Diagnosis not present

## 2018-08-28 DIAGNOSIS — R232 Flushing: Secondary | ICD-10-CM | POA: Diagnosis not present

## 2018-08-28 DIAGNOSIS — C7951 Secondary malignant neoplasm of bone: Secondary | ICD-10-CM | POA: Diagnosis not present

## 2018-08-28 LAB — CMP (CANCER CENTER ONLY)
ALT: 34 U/L (ref 0–44)
AST: 25 U/L (ref 15–41)
Albumin: 4.1 g/dL (ref 3.5–5.0)
Alkaline Phosphatase: 79 U/L (ref 38–126)
Anion gap: 10 (ref 5–15)
BUN: 16 mg/dL (ref 8–23)
CO2: 26 mmol/L (ref 22–32)
Calcium: 9.7 mg/dL (ref 8.9–10.3)
Chloride: 105 mmol/L (ref 98–111)
Creatinine: 0.84 mg/dL (ref 0.61–1.24)
GFR, Est AFR Am: >60 mL/min (ref >60)
GFR, Estimated: >60 mL/min (ref >60)
Glucose, Bld: 171 mg/dL — ABNORMAL HIGH (ref 70–99)
Potassium: 3.5 mmol/L (ref 3.5–5.1)
Sodium: 141 mmol/L (ref 135–145)
Total Bilirubin: 0.9 mg/dL (ref 0.3–1.2)
Total Protein: 6.9 g/dL (ref 6.5–8.1)

## 2018-08-28 LAB — CBC WITH DIFFERENTIAL (CANCER CENTER ONLY)
Abs Immature Granulocytes: 0.04 10*3/uL (ref 0.00–0.07)
Basophils Absolute: 0 10*3/uL (ref 0.0–0.1)
Basophils Relative: 0 %
Eosinophils Absolute: 0.1 10*3/uL (ref 0.0–0.5)
Eosinophils Relative: 1 %
HCT: 40.8 % (ref 39.0–52.0)
Hemoglobin: 14 g/dL (ref 13.0–17.0)
Immature Granulocytes: 0 %
Lymphocytes Relative: 14 %
Lymphs Abs: 1.3 10*3/uL (ref 0.7–4.0)
MCH: 29.5 pg (ref 26.0–34.0)
MCHC: 34.3 g/dL (ref 30.0–36.0)
MCV: 85.9 fL (ref 80.0–100.0)
Monocytes Absolute: 0.8 10*3/uL (ref 0.1–1.0)
Monocytes Relative: 9 %
Neutro Abs: 7 10*3/uL (ref 1.7–7.7)
Neutrophils Relative %: 76 %
Platelet Count: 230 10*3/uL (ref 150–400)
RBC: 4.75 MIL/uL (ref 4.22–5.81)
RDW: 12.6 % (ref 11.5–15.5)
WBC Count: 9.3 10*3/uL (ref 4.0–10.5)
nRBC: 0 % (ref 0.0–0.2)

## 2018-08-29 LAB — PROSTATE-SPECIFIC AG, SERUM (LABCORP): Prostate Specific Ag, Serum: 1.2 ng/mL (ref 0.0–4.0)

## 2018-08-30 ENCOUNTER — Other Ambulatory Visit: Payer: Self-pay

## 2018-08-30 ENCOUNTER — Inpatient Hospital Stay (HOSPITAL_BASED_OUTPATIENT_CLINIC_OR_DEPARTMENT_OTHER): Payer: Medicare Other | Admitting: Oncology

## 2018-08-30 VITALS — BP 143/70 | HR 77 | Temp 98.0°F | Resp 18 | Ht 70.0 in | Wt 226.4 lb

## 2018-08-30 DIAGNOSIS — Z192 Hormone resistant malignancy status: Secondary | ICD-10-CM | POA: Diagnosis not present

## 2018-08-30 DIAGNOSIS — R232 Flushing: Secondary | ICD-10-CM | POA: Diagnosis not present

## 2018-08-30 DIAGNOSIS — Z79899 Other long term (current) drug therapy: Secondary | ICD-10-CM | POA: Diagnosis not present

## 2018-08-30 DIAGNOSIS — C61 Malignant neoplasm of prostate: Secondary | ICD-10-CM

## 2018-08-30 DIAGNOSIS — C7951 Secondary malignant neoplasm of bone: Secondary | ICD-10-CM | POA: Diagnosis not present

## 2018-08-30 DIAGNOSIS — Z7982 Long term (current) use of aspirin: Secondary | ICD-10-CM | POA: Diagnosis not present

## 2018-08-30 DIAGNOSIS — G629 Polyneuropathy, unspecified: Secondary | ICD-10-CM | POA: Diagnosis not present

## 2018-08-30 NOTE — Progress Notes (Signed)
Hematology and Oncology Follow Up Visit  Dennis Patel [id number] December 16, 1950 68 y.o. 08/30/2018 3:47 PM Dennis Patel, [name], [name], [name], MD   Principle Diagnosis: 68 year old man with castration-resistant prostate cancer with disease to the bone diagnosed in 2019.  He he was found to have Gleason score 4+4 = 8 and a PSA of 48.    Prior Therapy:  Prostate biopsy obtained on December 05, 2017 showed a Gleason score of 4+4 = 8.  Taxotere 75 mg/m every 3 weeks started on January 31, 2018.  He completed 5 cycles of therapy in April 2020.  Current therapy:   Lupron 45 mg every 6 months starting in July 2020.  This will be repeated in 4 months.  Zytiga 1000 mg daily with prednisone 5 mg daily started in May 2020.   Interim History: Dennis Patel is here for a repeat evaluation.  Since the last visit, he continues to have issues with her neuropathy in his lower extremity as well as his fingertips.  He is able to ambulate although very limited distances and has to stop on multiple occasions.  He denies any abdominal pain or discomfort.  He denies any changes in his appetite.  Lower extremity edema has resolved.  He denied headaches, blurry vision, syncope or seizures.  Denies any fevers, chills or sweats.  Denied chest pain, palpitation, orthopnea or leg edema.  Denied cough, wheezing or hemoptysis.  Denied nausea, vomiting or abdominal pain.  Denies any constipation or diarrhea.  Denies any frequency urgency or hesitancy.  Denies any arthralgias or myalgias.  Denies any skin rashes or lesions.  Denies any bleeding or clotting tendency.  Denies any easy bruising.  Denies any hair or nail changes.  Denies any anxiety or depression.  Remaining review of system is negative.                   Medications: Updated today on review. Current Outpatient Medications  Medication Sig Dispense Refill  . acetaminophen (TYLENOL) 325 MG tablet Take 650 mg by mouth every 6 (six) hours as  needed.    Marland Kitchen amLODipine (NORVASC) 10 MG tablet Take 1 tablet (10 mg total) by mouth daily. 30 tablet 0  . aspirin EC 81 MG tablet Take 81 mg by mouth daily.    Marland Kitchen atorvastatin (LIPITOR) 10 MG tablet TAKE 1 TABLET BY MOUTH DAILY 90 tablet 3  . calcium-vitamin D (OSCAL WITH D) 250-125 MG-UNIT tablet Take 1 tablet by mouth daily.    . clopidogrel (PLAVIX) 75 MG tablet TAKE 1 TABLET BY MOUTH EVERY DAY 90 tablet 3  . famotidine (PEPCID) 10 MG tablet Take 10 mg by mouth daily.    . furosemide (LASIX) 20 MG tablet TAKE 2 TABLETS BY MOUTH EVERY DAY 60 tablet 0  . lidocaine-prilocaine (EMLA) cream Apply 1 application topically as needed. 30 g 0  . Multiple Vitamins-Minerals (ONE DAILY MULTIVITAMIN MEN PO) Take 1 tablet by mouth daily.    Marland Kitchen oxyCODONE (OXY IR/ROXICODONE) 5 MG immediate release tablet Take 1 tablet (5 mg total) by mouth every 4 (four) hours as needed for severe pain. 90 tablet 0  . predniSONE (DELTASONE) 5 MG tablet Take 1 tablet (5 mg total) by mouth daily with breakfast. 90 tablet 3  . promethazine (PHENERGAN) 25 MG tablet Take 1 tablet (25 mg total) by mouth every 6 (six) hours as needed for nausea or vomiting. 30 tablet 3  . tamsulosin (FLOMAX) 0.4 MG CAPS capsule     . traZODone (DESYREL)  50 MG tablet TAKE 1 TABLET BY MOUTH EVERYDAY AT BEDTIME 90 tablet 1  . ZYTIGA 250 MG tablet TAKE 4 TABLETS BY MOUTH ON AN EMPTY STOMACH ONCE DAILY AS DIRECTED.  TAKE 1 HOUR BEFORE OR 2 HOURS AFTER A MEAL 120 tablet 10   No current facility-administered medications for this visit.      Allergies: No Known Allergies  Past Medical History, Surgical history, Social history, and Family History without any changes on review.   Physical Exam:     Blood pressure (!) 143/70, pulse 77, temperature 98 F (36.7 C), temperature source Temporal, resp. rate 18, height 5\' 10"  (1.778 m), weight 226 lb 6 oz (102.7 kg), SpO2 98 %.     ECOG: 0    General appearance: Alert, awake without any  distress. Head: Atraumatic without abnormalities Oropharynx: Without any thrush or ulcers. Eyes: No scleral icterus. Lymph nodes: No lymphadenopathy noted in the cervical, supraclavicular, or axillary nodes Heart:regular rate and rhythm, without any murmurs or gallops.   Lung: Clear to auscultation without any rhonchi, wheezes or dullness to percussion. Abdomin: Soft, nontender without any shifting dullness or ascites. Musculoskeletal: No clubbing or cyanosis. Neurological: No motor or sensory deficits. Skin: No rashes or lesions. Psychiatric: Mood and affect appeared normal.            Lab Results: Lab Results  Component Value Date   WBC 9.3 08/28/2018   HGB 14.0 08/28/2018   HCT 40.8 08/28/2018   MCV 85.9 08/28/2018   PLT 230 08/28/2018     Chemistry      Component Value Date/Time   NA 141 08/28/2018 1058   K 3.5 08/28/2018 1058   CL 105 08/28/2018 1058   CO2 26 08/28/2018 1058   BUN 16 08/28/2018 1058   CREATININE 0.84 08/28/2018 1058      Component Value Date/Time   CALCIUM 9.7 08/28/2018 1058   ALKPHOS 79 08/28/2018 1058   AST 25 08/28/2018 1058   ALT 34 08/28/2018 1058   BILITOT 0.9 08/28/2018 1058       Results for Dennis Patel, Dennis Patel (MRN [id number]) as of 08/30/2018 15:51  Ref. Range 07/17/2018 10:28 08/28/2018 10:58  Prostate Specific Ag, Serum Latest Ref Range: 0.0 - 4.0 ng/mL 1.5 1.2       Impression and Plan:   68 year old man with:  1.    Advanced prostate cancer with disease to the bone diagnosed in 2019.  He has castration-resistant disease at this time.  He continues to tolerate Zytiga without any major complications.  His PSA continues to show reasonable response currently at 1.2 after decline from 4.8.  Potential long-term complication associated with this medication include edema, hypertension and hypokalemia.  Is agreeable to continue at this time.   2.  Androgen deprivation therapy: He is currently receiving Lupron 45 mg every 6  months.  Last injection was given in July 2020.  He has experienced some hot flashes but no other complications.  Long-term issues associated with this medication was reviewed which include osteoporosis as well as hyperlipidemia.   3.  Bone directed therapy: He will continue on calcium and vitamin D supplements and consider Xgeva after obtaining dental clearance.   4.    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  He uses oxycodone at nighttime  5.  IV access: Port-A-Cath will continue to be flushed periodically.  6.  Prognosis and goals of care: His disease is incurable although aggressive measures are  warranted given his excellent performance status.  7.  Follow-up: In 6 to 8 weeks for repeat evaluation.   25 minutes was spent with the patient face-to-face today.  More than 50% of time was dedicated to reviewing his disease status, reviewing laboratory data, complications of therapy and future plan of care.   Zola Button, MD 8/13/20203:47 PM

## 2018-08-31 ENCOUNTER — Telehealth: Payer: Self-pay | Admitting: Oncology

## 2018-08-31 NOTE — Telephone Encounter (Signed)
Called and spoke with patient. Confirmed Oct. appt  °

## 2018-10-03 ENCOUNTER — Other Ambulatory Visit: Payer: Self-pay | Admitting: Oncology

## 2018-10-03 MED ORDER — OXYCODONE HCL 5 MG PO TABS: 5.0000 mg | ORAL_TABLET | ORAL | 0 refills | Status: DC | PRN

## 2018-10-09 ENCOUNTER — Other Ambulatory Visit: Payer: Self-pay | Admitting: Oncology

## 2018-10-09 MED ORDER — TRAZODONE HCL 50 MG PO TABS: 50.0000 mg | ORAL_TABLET | Freq: Every evening | ORAL | 1 refills | Status: DC | PRN

## 2018-10-19 ENCOUNTER — Inpatient Hospital Stay: Payer: Medicare Other

## 2018-10-19 ENCOUNTER — Other Ambulatory Visit: Payer: Self-pay

## 2018-10-19 ENCOUNTER — Inpatient Hospital Stay: Payer: Medicare Other | Attending: Oncology

## 2018-10-19 DIAGNOSIS — C61 Malignant neoplasm of prostate: Secondary | ICD-10-CM | POA: Insufficient documentation

## 2018-10-19 DIAGNOSIS — Z79899 Other long term (current) drug therapy: Secondary | ICD-10-CM | POA: Insufficient documentation

## 2018-10-19 DIAGNOSIS — G629 Polyneuropathy, unspecified: Secondary | ICD-10-CM | POA: Insufficient documentation

## 2018-10-19 DIAGNOSIS — Z192 Hormone resistant malignancy status: Secondary | ICD-10-CM | POA: Insufficient documentation

## 2018-10-19 DIAGNOSIS — C7951 Secondary malignant neoplasm of bone: Secondary | ICD-10-CM | POA: Insufficient documentation

## 2018-10-19 DIAGNOSIS — Z95828 Presence of other vascular implants and grafts: Secondary | ICD-10-CM

## 2018-10-19 DIAGNOSIS — Z9221 Personal history of antineoplastic chemotherapy: Secondary | ICD-10-CM | POA: Diagnosis not present

## 2018-10-19 LAB — CBC WITH DIFFERENTIAL (CANCER CENTER ONLY)
Abs Immature Granulocytes: 0.04 10*3/uL (ref 0.00–0.07)
Basophils Absolute: 0 10*3/uL (ref 0.0–0.1)
Basophils Relative: 0 %
Eosinophils Absolute: 0.1 10*3/uL (ref 0.0–0.5)
Eosinophils Relative: 2 %
HCT: 38.6 % — ABNORMAL LOW (ref 39.0–52.0)
Hemoglobin: 13.3 g/dL (ref 13.0–17.0)
Immature Granulocytes: 1 %
Lymphocytes Relative: 16 %
Lymphs Abs: 1.2 10*3/uL (ref 0.7–4.0)
MCH: 30.3 pg (ref 26.0–34.0)
MCHC: 34.5 g/dL (ref 30.0–36.0)
MCV: 87.9 fL (ref 80.0–100.0)
Monocytes Absolute: 0.6 10*3/uL (ref 0.1–1.0)
Monocytes Relative: 9 %
Neutro Abs: 5.3 10*3/uL (ref 1.7–7.7)
Neutrophils Relative %: 72 %
Platelet Count: 223 10*3/uL (ref 150–400)
RBC: 4.39 MIL/uL (ref 4.22–5.81)
RDW: 12.9 % (ref 11.5–15.5)
WBC Count: 7.3 10*3/uL (ref 4.0–10.5)
nRBC: 0 % (ref 0.0–0.2)

## 2018-10-19 LAB — CMP (CANCER CENTER ONLY)
ALT: 37 U/L (ref 0–44)
AST: 20 U/L (ref 15–41)
Albumin: 4.1 g/dL (ref 3.5–5.0)
Alkaline Phosphatase: 83 U/L (ref 38–126)
Anion gap: 10 (ref 5–15)
BUN: 13 mg/dL (ref 8–23)
CO2: 25 mmol/L (ref 22–32)
Calcium: 10 mg/dL (ref 8.9–10.3)
Chloride: 106 mmol/L (ref 98–111)
Creatinine: 0.83 mg/dL (ref 0.61–1.24)
GFR, Est AFR Am: >60 mL/min (ref >60)
GFR, Estimated: >60 mL/min (ref >60)
Glucose, Bld: 165 mg/dL — ABNORMAL HIGH (ref 70–99)
Potassium: 3.6 mmol/L (ref 3.5–5.1)
Sodium: 141 mmol/L (ref 135–145)
Total Bilirubin: 0.7 mg/dL (ref 0.3–1.2)
Total Protein: 6.4 g/dL — ABNORMAL LOW (ref 6.5–8.1)

## 2018-10-19 MED ORDER — HEPARIN SOD (PORK) LOCK FLUSH 100 UNIT/ML IV SOLN
500.0000 [IU] | Freq: Once | INTRAVENOUS | Status: AC | PRN
  Administered 2018-10-19: 11:00:00 500 [IU]
  Filled 2018-10-19: qty 5

## 2018-10-19 MED ORDER — SODIUM CHLORIDE 0.9% FLUSH
10.0000 mL | INTRAVENOUS | Status: DC | PRN
  Administered 2018-10-19: 11:00:00 10 mL
  Filled 2018-10-19: qty 10

## 2018-10-20 LAB — PROSTATE-SPECIFIC AG, SERUM (LABCORP): Prostate Specific Ag, Serum: 1.2 ng/mL (ref 0.0–4.0)

## 2018-10-23 ENCOUNTER — Inpatient Hospital Stay: Payer: Medicare Other | Admitting: Oncology

## 2018-10-23 ENCOUNTER — Other Ambulatory Visit: Payer: Self-pay

## 2018-10-23 VITALS — BP 139/70 | HR 80 | Temp 98.3°F | Resp 18 | Ht 70.0 in | Wt 232.2 lb

## 2018-10-23 DIAGNOSIS — Z192 Hormone resistant malignancy status: Secondary | ICD-10-CM | POA: Diagnosis not present

## 2018-10-23 DIAGNOSIS — Z79899 Other long term (current) drug therapy: Secondary | ICD-10-CM | POA: Diagnosis not present

## 2018-10-23 DIAGNOSIS — C61 Malignant neoplasm of prostate: Secondary | ICD-10-CM | POA: Diagnosis not present

## 2018-10-23 DIAGNOSIS — Z9221 Personal history of antineoplastic chemotherapy: Secondary | ICD-10-CM | POA: Diagnosis not present

## 2018-10-23 DIAGNOSIS — G629 Polyneuropathy, unspecified: Secondary | ICD-10-CM | POA: Diagnosis not present

## 2018-10-23 DIAGNOSIS — C7951 Secondary malignant neoplasm of bone: Secondary | ICD-10-CM | POA: Diagnosis not present

## 2018-10-23 NOTE — Progress Notes (Signed)
Hematology and Oncology Follow Up Visit  ABHIRAM GOZA [id number] September 07, 1950 68 y.o. 10/23/2018 1:38 PM [name], [name], [name], [name], MD   Principle Diagnosis: 68 year old man with advanced prostate cancer with disease to the bone diagnosed in 2019.  He presented with Gleason score 4+4 = 8 and a PSA of 48 and subsequently developed castration resistant disease.  Prior Therapy:  Prostate biopsy obtained on December 05, 2017 showed a Gleason score of 4+4 = 8.  Taxotere 75 mg/m every 3 weeks started on January 31, 2018.  He completed 5 cycles of therapy in April 2020.  Current therapy:   Lupron 45 mg every 6 months starting in July 2020.  This will be repeated in 4 months.  Zytiga 1000 mg daily with prednisone 5 mg daily started in May 2020.   Interim History: Mr. Melian returns today for a follow-up.  Since the last visit, he reports no major changes in his health.  He continues to tolerate Zytiga without any complaints.  He denies any excessive fatigue, tiredness or nausea.  His appetite and performance status remain excellent.  He continues to have foot discomfort including decreased proprioception at times but still able to ambulate without any issues.  He is no longer taking oxycodone but does take Tylenol for neuropathic pain.  Patient denied any alteration mental status, neuropathy, confusion or dizziness.  Denies any headaches or lethargy.  Denies any night sweats, weight loss or changes in appetite.  Denied orthopnea, dyspnea on exertion or chest discomfort.  Denies shortness of breath, difficulty breathing hemoptysis or cough.  Denies any abdominal distention, nausea, early satiety or dyspepsia.  Denies any hematuria, frequency, dysuria or nocturia.  Denies any skin irritation, dryness or rash.  Denies any ecchymosis or petechiae.  Denies any lymphadenopathy or clotting.  Denies any heat or cold intolerance.  Denies any anxiety or depression.  Remaining review of system is  negative.                       Medications: Without any changes on review. Current Outpatient Medications  Medication Sig Dispense Refill  . acetaminophen (TYLENOL) 325 MG tablet Take 650 mg by mouth every 6 (six) hours as needed.    Marland Kitchen amLODipine (NORVASC) 10 MG tablet Take 1 tablet (10 mg total) by mouth daily. 30 tablet 0  . aspirin EC 81 MG tablet Take 81 mg by mouth daily.    Marland Kitchen atorvastatin (LIPITOR) 10 MG tablet TAKE 1 TABLET BY MOUTH DAILY 90 tablet 3  . calcium-vitamin D (OSCAL WITH D) 250-125 MG-UNIT tablet Take 1 tablet by mouth daily.    . clopidogrel (PLAVIX) 75 MG tablet TAKE 1 TABLET BY MOUTH EVERY DAY 90 tablet 3  . famotidine (PEPCID) 10 MG tablet Take 10 mg by mouth daily.    . furosemide (LASIX) 20 MG tablet TAKE 2 TABLETS BY MOUTH EVERY DAY 60 tablet 0  . lidocaine-prilocaine (EMLA) cream Apply 1 application topically as needed. 30 g 0  . Multiple Vitamins-Minerals (ONE DAILY MULTIVITAMIN MEN PO) Take 1 tablet by mouth daily.    Marland Kitchen oxyCODONE (OXY IR/ROXICODONE) 5 MG immediate release tablet Take 1 tablet (5 mg total) by mouth every 4 (four) hours as needed for severe pain. 90 tablet 0  . predniSONE (DELTASONE) 5 MG tablet Take 1 tablet (5 mg total) by mouth daily with breakfast. 90 tablet 3  . promethazine (PHENERGAN) 25 MG tablet Take 1 tablet (25 mg total) by mouth every 6 (  six) hours as needed for nausea or vomiting. 30 tablet 3  . tamsulosin (FLOMAX) 0.4 MG CAPS capsule     . traZODone (DESYREL) 50 MG tablet Take 1 tablet (50 mg total) by mouth at bedtime as needed for sleep. 90 tablet 1  . ZYTIGA 250 MG tablet TAKE 4 TABLETS BY MOUTH ON AN EMPTY STOMACH ONCE DAILY AS DIRECTED.  TAKE 1 HOUR BEFORE OR 2 HOURS AFTER A MEAL 120 tablet 10   No current facility-administered medications for this visit.      Allergies: No Known Allergies  Past Medical History, Surgical history, Social history, and Family History updated without any changes.  Physical  Exam:     Blood pressure 139/70, pulse 80, temperature 98.3 F (36.8 C), temperature source Temporal, resp. rate 18, height 5\' 10"  (1.778 m), weight 232 lb 3.2 oz (105.3 kg), SpO2 99 %.      ECOG: 0    General appearance: Comfortable appearing without any discomfort Head: Normocephalic without any trauma Oropharynx: Mucous membranes are moist and pink without any thrush or ulcers. Eyes: Pupils are equal and round reactive to light. Lymph nodes: No cervical, supraclavicular, inguinal or axillary lymphadenopathy.   Heart:regular rate and rhythm.  S1 and S2 without leg edema. Lung: Clear without any rhonchi or wheezes.  No dullness to percussion. Abdomin: Soft, nontender, nondistended with good bowel sounds.  No hepatosplenomegaly. Musculoskeletal: No joint deformity or effusion.  Full range of motion noted. Neurological: No deficits noted on motor, sensory and deep tendon reflex exam. Skin: No petechial rash or dryness.  Appeared moist.              Lab Results: Lab Results  Component Value Date   WBC 7.3 10/19/2018   HGB 13.3 10/19/2018   HCT 38.6 (L) 10/19/2018   MCV 87.9 10/19/2018   PLT 223 10/19/2018     Chemistry      Component Value Date/Time   NA 141 10/19/2018 1049   K 3.6 10/19/2018 1049   CL 106 10/19/2018 1049   CO2 25 10/19/2018 1049   BUN 13 10/19/2018 1049   CREATININE 0.83 10/19/2018 1049      Component Value Date/Time   CALCIUM 10.0 10/19/2018 1049   ALKPHOS 83 10/19/2018 1049   AST 20 10/19/2018 1049   ALT 37 10/19/2018 1049   BILITOT 0.7 10/19/2018 1049       Results for AUDEL, ANTONIEWICZ (MRN [id number]) as of 10/23/2018 12:46  Ref. Range 08/28/2018 10:58 10/19/2018 10:49  Prostate Specific Ag, Serum Latest Ref Range: 0.0 - 4.0 ng/mL 1.2 1.2        Impression and Plan:   68 year old man with:  1.    Castration hyper resistant prostate cancer with disease to the bone noted in 2019.   He continues to have excellent PSA  response on Zytiga that is currently stable at 1.2.  He reports no recent complications related to this medication.  Risks and benefits of continuing therapy long-term was discussed.  Alternative options were also reiterated including Xofigo and Jevtana.  At this time I recommended continuing the same dose and schedule.  He is agreeable at this time.   2.  Androgen deprivation therapy: Next injection will be in January 2020.  Long-term complications related to androgen deprivation including weight gain, hot flashes and osteoporosis were reiterated.  3.  Bone directed therapy: I recommended continuing calcium and vitamin D supplements.  Delton See will be considered he develops worsening bony metastatic disease after  dental clearance.   4.    Neuropathic pain: Related to previous chemotherapy exposure.  Manageable at this time continues to use Tylenol without using oxycodone.  5.  IV access: Port-A-Cath remains in place and will be flushed every 6 weeks.  6.  Prognosis and goals of care: Therapy remains palliative at this time given his disease is incurable.  7.  Follow-up: In 3 months for repeat evaluation.   25 minutes was spent with the patient face-to-face today.  More than 50% of time was spent on updating his disease status, treatment options, reviewing laboratory data and answering questions regarding future plan of care.   Zola Button, MD 10/6/20201:38 PM

## 2018-11-05 ENCOUNTER — Other Ambulatory Visit: Payer: Self-pay | Admitting: Oncology

## 2018-11-08 ENCOUNTER — Telehealth: Payer: Self-pay

## 2018-11-08 NOTE — Telephone Encounter (Signed)
Oral Oncology Patient Advocate Encounter  Zytiga patient assistance with Dennis Patel and Dennis Patel will expire 01/17/19.  I called the patient to talk about working on getting the renewal application signed and get his income info. The patient stated that he received a letter from [employer] that said his Fabio Asa has been automatically approved to be filled through them until 01/17/20.  We will reapply at the end of 2021 if necessary.  [place] [facility] [place] Phone [phone] Fax [phone] 11/08/2018   2:18 PM

## 2018-12-04 ENCOUNTER — Other Ambulatory Visit: Payer: Self-pay

## 2018-12-04 ENCOUNTER — Inpatient Hospital Stay: Payer: Medicare Other | Attending: Oncology

## 2018-12-04 DIAGNOSIS — Z452 Encounter for adjustment and management of vascular access device: Secondary | ICD-10-CM | POA: Insufficient documentation

## 2018-12-04 DIAGNOSIS — C61 Malignant neoplasm of prostate: Secondary | ICD-10-CM | POA: Diagnosis not present

## 2018-12-04 DIAGNOSIS — Z95828 Presence of other vascular implants and grafts: Secondary | ICD-10-CM

## 2018-12-04 MED ORDER — HEPARIN SOD (PORK) LOCK FLUSH 100 UNIT/ML IV SOLN
500.0000 [IU] | Freq: Once | INTRAVENOUS | Status: AC | PRN
  Administered 2018-12-04: 500 [IU]
  Filled 2018-12-04: qty 5

## 2018-12-04 MED ORDER — SODIUM CHLORIDE 0.9% FLUSH
10.0000 mL | INTRAVENOUS | Status: DC | PRN
  Administered 2018-12-04: 10 mL
  Filled 2018-12-04: qty 10

## 2019-01-01 DIAGNOSIS — Z0000 Encounter for general adult medical examination without abnormal findings: Secondary | ICD-10-CM | POA: Diagnosis not present

## 2019-01-01 DIAGNOSIS — E7800 Pure hypercholesterolemia, unspecified: Secondary | ICD-10-CM | POA: Diagnosis not present

## 2019-01-01 DIAGNOSIS — I70219 Atherosclerosis of native arteries of extremities with intermittent claudication, unspecified extremity: Secondary | ICD-10-CM | POA: Diagnosis not present

## 2019-01-01 DIAGNOSIS — I10 Essential (primary) hypertension: Secondary | ICD-10-CM | POA: Diagnosis not present

## 2019-01-03 DIAGNOSIS — E7800 Pure hypercholesterolemia, unspecified: Secondary | ICD-10-CM | POA: Diagnosis not present

## 2019-01-03 DIAGNOSIS — Z1211 Encounter for screening for malignant neoplasm of colon: Secondary | ICD-10-CM | POA: Diagnosis not present

## 2019-01-28 ENCOUNTER — Other Ambulatory Visit: Payer: Self-pay | Admitting: Emergency Medicine

## 2019-01-28 DIAGNOSIS — C61 Malignant neoplasm of prostate: Secondary | ICD-10-CM

## 2019-01-29 ENCOUNTER — Other Ambulatory Visit: Payer: Self-pay

## 2019-01-29 ENCOUNTER — Inpatient Hospital Stay: Payer: Medicare Other

## 2019-01-29 ENCOUNTER — Inpatient Hospital Stay: Payer: Medicare Other | Attending: Oncology

## 2019-01-29 DIAGNOSIS — Z7982 Long term (current) use of aspirin: Secondary | ICD-10-CM | POA: Insufficient documentation

## 2019-01-29 DIAGNOSIS — Z7952 Long term (current) use of systemic steroids: Secondary | ICD-10-CM | POA: Diagnosis not present

## 2019-01-29 DIAGNOSIS — Z79818 Long term (current) use of other agents affecting estrogen receptors and estrogen levels: Secondary | ICD-10-CM | POA: Insufficient documentation

## 2019-01-29 DIAGNOSIS — M79671 Pain in right foot: Secondary | ICD-10-CM | POA: Insufficient documentation

## 2019-01-29 DIAGNOSIS — I10 Essential (primary) hypertension: Secondary | ICD-10-CM | POA: Insufficient documentation

## 2019-01-29 DIAGNOSIS — C61 Malignant neoplasm of prostate: Secondary | ICD-10-CM | POA: Insufficient documentation

## 2019-01-29 DIAGNOSIS — G620 Drug-induced polyneuropathy: Secondary | ICD-10-CM | POA: Diagnosis not present

## 2019-01-29 DIAGNOSIS — Z79899 Other long term (current) drug therapy: Secondary | ICD-10-CM | POA: Diagnosis not present

## 2019-01-29 DIAGNOSIS — T451X5A Adverse effect of antineoplastic and immunosuppressive drugs, initial encounter: Secondary | ICD-10-CM | POA: Insufficient documentation

## 2019-01-29 DIAGNOSIS — C7951 Secondary malignant neoplasm of bone: Secondary | ICD-10-CM | POA: Diagnosis not present

## 2019-01-29 DIAGNOSIS — Z95828 Presence of other vascular implants and grafts: Secondary | ICD-10-CM

## 2019-01-29 LAB — CBC WITH DIFFERENTIAL (CANCER CENTER ONLY)
Abs Immature Granulocytes: 0.05 10*3/uL (ref 0.00–0.07)
Basophils Absolute: 0 10*3/uL (ref 0.0–0.1)
Basophils Relative: 0 %
Eosinophils Absolute: 0.2 10*3/uL (ref 0.0–0.5)
Eosinophils Relative: 2 %
HCT: 38.7 % — ABNORMAL LOW (ref 39.0–52.0)
Hemoglobin: 13.5 g/dL (ref 13.0–17.0)
Immature Granulocytes: 1 %
Lymphocytes Relative: 14 %
Lymphs Abs: 1.2 10*3/uL (ref 0.7–4.0)
MCH: 30.3 pg (ref 26.0–34.0)
MCHC: 34.9 g/dL (ref 30.0–36.0)
MCV: 87 fL (ref 80.0–100.0)
Monocytes Absolute: 0.8 10*3/uL (ref 0.1–1.0)
Monocytes Relative: 9 %
Neutro Abs: 6.4 10*3/uL (ref 1.7–7.7)
Neutrophils Relative %: 74 %
Platelet Count: 213 10*3/uL (ref 150–400)
RBC: 4.45 MIL/uL (ref 4.22–5.81)
RDW: 12.2 % (ref 11.5–15.5)
WBC Count: 8.7 10*3/uL (ref 4.0–10.5)
nRBC: 0 % (ref 0.0–0.2)

## 2019-01-29 LAB — CMP (CANCER CENTER ONLY)
ALT: 55 U/L — ABNORMAL HIGH (ref 0–44)
AST: 33 U/L (ref 15–41)
Albumin: 3.9 g/dL (ref 3.5–5.0)
Alkaline Phosphatase: 89 U/L (ref 38–126)
Anion gap: 12 (ref 5–15)
BUN: 12 mg/dL (ref 8–23)
CO2: 26 mmol/L (ref 22–32)
Calcium: 9.3 mg/dL (ref 8.9–10.3)
Chloride: 105 mmol/L (ref 98–111)
Creatinine: 0.82 mg/dL (ref 0.61–1.24)
GFR, Est AFR Am: >60 mL/min (ref >60)
GFR, Estimated: >60 mL/min (ref >60)
Glucose, Bld: 182 mg/dL — ABNORMAL HIGH (ref 70–99)
Potassium: 3.5 mmol/L (ref 3.5–5.1)
Sodium: 143 mmol/L (ref 135–145)
Total Bilirubin: 0.7 mg/dL (ref 0.3–1.2)
Total Protein: 6.1 g/dL — ABNORMAL LOW (ref 6.5–8.1)

## 2019-01-29 MED ORDER — SODIUM CHLORIDE 0.9% FLUSH
10.0000 mL | INTRAVENOUS | Status: DC | PRN
  Administered 2019-01-29: 10 mL
  Filled 2019-01-29: qty 10

## 2019-01-29 MED ORDER — HEPARIN SOD (PORK) LOCK FLUSH 100 UNIT/ML IV SOLN
500.0000 [IU] | Freq: Once | INTRAVENOUS | Status: AC | PRN
  Administered 2019-01-29: 09:00:00 500 [IU]
  Filled 2019-01-29: qty 5

## 2019-01-30 LAB — PROSTATE-SPECIFIC AG, SERUM (LABCORP): Prostate Specific Ag, Serum: 0.5 ng/mL (ref 0.0–4.0)

## 2019-02-01 ENCOUNTER — Telehealth: Payer: Self-pay | Admitting: Oncology

## 2019-02-01 ENCOUNTER — Other Ambulatory Visit: Payer: Self-pay

## 2019-02-01 ENCOUNTER — Inpatient Hospital Stay: Payer: Medicare Other

## 2019-02-01 ENCOUNTER — Inpatient Hospital Stay (HOSPITAL_BASED_OUTPATIENT_CLINIC_OR_DEPARTMENT_OTHER): Payer: Medicare Other | Admitting: Oncology

## 2019-02-01 VITALS — BP 151/67 | HR 63 | Temp 98.2°F | Resp 17 | Ht 70.0 in | Wt 233.6 lb

## 2019-02-01 DIAGNOSIS — T451X5A Adverse effect of antineoplastic and immunosuppressive drugs, initial encounter: Secondary | ICD-10-CM | POA: Diagnosis not present

## 2019-02-01 DIAGNOSIS — C7951 Secondary malignant neoplasm of bone: Secondary | ICD-10-CM | POA: Diagnosis not present

## 2019-02-01 DIAGNOSIS — C61 Malignant neoplasm of prostate: Secondary | ICD-10-CM

## 2019-02-01 DIAGNOSIS — G620 Drug-induced polyneuropathy: Secondary | ICD-10-CM | POA: Diagnosis not present

## 2019-02-01 DIAGNOSIS — Z7982 Long term (current) use of aspirin: Secondary | ICD-10-CM | POA: Diagnosis not present

## 2019-02-01 DIAGNOSIS — Z95828 Presence of other vascular implants and grafts: Secondary | ICD-10-CM

## 2019-02-01 DIAGNOSIS — M79671 Pain in right foot: Secondary | ICD-10-CM | POA: Diagnosis not present

## 2019-02-01 DIAGNOSIS — Z7952 Long term (current) use of systemic steroids: Secondary | ICD-10-CM | POA: Diagnosis not present

## 2019-02-01 DIAGNOSIS — I10 Essential (primary) hypertension: Secondary | ICD-10-CM | POA: Diagnosis not present

## 2019-02-01 DIAGNOSIS — Z79818 Long term (current) use of other agents affecting estrogen receptors and estrogen levels: Secondary | ICD-10-CM | POA: Diagnosis not present

## 2019-02-01 DIAGNOSIS — Z79899 Other long term (current) drug therapy: Secondary | ICD-10-CM | POA: Diagnosis not present

## 2019-02-01 MED ORDER — LEUPROLIDE ACETATE (4 MONTH) 30 MG IM KIT: 45.0000 mg | PACK | Freq: Once | INTRAMUSCULAR | Status: DC

## 2019-02-01 MED ORDER — LEUPROLIDE ACETATE (6 MONTH) 45 MG ~~LOC~~ KIT
45.0000 mg | PACK | Freq: Once | SUBCUTANEOUS | Status: AC
  Administered 2019-02-01: 45 mg via SUBCUTANEOUS
  Filled 2019-02-01: qty 45

## 2019-02-01 MED ORDER — ALTEPLASE 2 MG IJ SOLR
2.0000 mg | Freq: Once | INTRAMUSCULAR | Status: DC | PRN
  Filled 2019-02-01: qty 2

## 2019-02-01 NOTE — Telephone Encounter (Signed)
Scheduled appt per 1/15 los.  Sent a message to HIM pool to get a calendar mailed out. 

## 2019-02-01 NOTE — Progress Notes (Signed)
Hematology and Oncology Follow Up Visit  Dennis Patel [id number] 1950-10-04 69 y.o. 02/01/2019 8:17 AM Dennis Patel, [name], [name], [name], MD   Principle Diagnosis: 69 year old man with castration-resistant prostate cancer with disease to the bone noted in 2019.  He was found to have Gleason score 4+4 = 8 and a PSA of 48 and advanced disease at the time of diagnosis.  Prior Therapy:  Prostate biopsy obtained on December 05, 2017 showed a Gleason score of 4+4 = 8.  Taxotere 75 mg/m every 3 weeks started on January 31, 2018.  He completed 5 cycles of therapy in April 2020.  Current therapy:   Lupron 45 mg every 6 months starting in July 2020.  This will be repeated in 4 months.  Zytiga 1000 mg daily with prednisone 5 mg daily started in May 2020.   Interim History: Dennis Patel is here for return evaluation.  Since the last visit, he reports no major changes in his health.  He continues to tolerate Zytiga without any significant complaints.  Denies any nausea, fatigue or lower extremity edema.  He does report right foot pain which is chronic in nature and has not dramatically changed.  His performance status quality of life remains unchanged.                       Medications: Reviewed today. Current Outpatient Medications  Medication Sig Dispense Refill  . acetaminophen (TYLENOL) 325 MG tablet Take 650 mg by mouth every 6 (six) hours as needed.    Marland Kitchen amLODipine (NORVASC) 10 MG tablet Take 1 tablet (10 mg total) by mouth daily. 30 tablet 0  . aspirin EC 81 MG tablet Take 81 mg by mouth daily.    Marland Kitchen atorvastatin (LIPITOR) 10 MG tablet TAKE 1 TABLET BY MOUTH DAILY 90 tablet 3  . calcium-vitamin D (OSCAL WITH D) 250-125 MG-UNIT tablet Take 1 tablet by mouth daily.    . clopidogrel (PLAVIX) 75 MG tablet TAKE 1 TABLET BY MOUTH EVERY DAY 90 tablet 3  . famotidine (PEPCID) 10 MG tablet Take 10 mg by mouth daily.    . furosemide (LASIX) 20 MG tablet TAKE 2 TABLETS BY  MOUTH EVERY DAY 60 tablet 0  . gabapentin (NEURONTIN) 300 MG capsule TAKE 1 CAPSULE BY MOUTH EVERYDAY AT BEDTIME 90 capsule 1  . lidocaine-prilocaine (EMLA) cream Apply 1 application topically as needed. 30 g 0  . Multiple Vitamins-Minerals (ONE DAILY MULTIVITAMIN MEN PO) Take 1 tablet by mouth daily.    Marland Kitchen oxyCODONE (OXY IR/ROXICODONE) 5 MG immediate release tablet Take 1 tablet (5 mg total) by mouth every 4 (four) hours as needed for severe pain. 90 tablet 0  . predniSONE (DELTASONE) 5 MG tablet Take 1 tablet (5 mg total) by mouth daily with breakfast. 90 tablet 3  . promethazine (PHENERGAN) 25 MG tablet Take 1 tablet (25 mg total) by mouth every 6 (six) hours as needed for nausea or vomiting. 30 tablet 3  . tamsulosin (FLOMAX) 0.4 MG CAPS capsule     . traZODone (DESYREL) 50 MG tablet Take 1 tablet (50 mg total) by mouth at bedtime as needed for sleep. 90 tablet 1  . ZYTIGA 250 MG tablet TAKE 4 TABLETS BY MOUTH ON AN EMPTY STOMACH ONCE DAILY AS DIRECTED.  TAKE 1 HOUR BEFORE OR 2 HOURS AFTER A MEAL 120 tablet 10   No current facility-administered medications for this visit.     Allergies: No Known Allergies    Physical Exam:  Blood pressure (!) 151/67, pulse 63, temperature 98.2 F (36.8 C), temperature source Temporal, resp. rate 17, height 5\' 10"  (1.778 m), weight 233 lb 9.6 oz (106 kg), SpO2 97 %.       ECOG: 0    General appearance: Alert, awake without any distress. Head: Atraumatic without abnormalities Oropharynx: Without any thrush or ulcers. Eyes: No scleral icterus. Lymph nodes: No lymphadenopathy noted in the cervical, supraclavicular, or axillary nodes Heart:regular rate and rhythm, without any murmurs or gallops.   Lung: Clear to auscultation without any rhonchi, wheezes or dullness to percussion. Abdomin: Soft, nontender without any shifting dullness or ascites. Musculoskeletal: No clubbing or cyanosis. Neurological: No motor or sensory deficits. Skin:  No rashes or lesions. Psychiatric: Mood and affect appeared normal.              Lab Results: Lab Results  Component Value Date   WBC 8.7 01/29/2019   HGB 13.5 01/29/2019   HCT 38.7 (L) 01/29/2019   MCV 87.0 01/29/2019   PLT 213 01/29/2019     Chemistry      Component Value Date/Time   NA 143 01/29/2019 0842   K 3.5 01/29/2019 0842   CL 105 01/29/2019 0842   CO2 26 01/29/2019 0842   BUN 12 01/29/2019 0842   CREATININE 0.82 01/29/2019 0842      Component Value Date/Time   CALCIUM 9.3 01/29/2019 0842   ALKPHOS 89 01/29/2019 0842   AST 33 01/29/2019 0842   ALT 55 (H) 01/29/2019 0842   BILITOT 0.7 01/29/2019 0842        Results for Dennis Patel, Dennis Patel (MRN [id number]) as of 02/01/2019 08:18  Ref. Range 01/29/2019 08:42  Prostate Specific Ag, Serum Latest Ref Range: 0.0 - 4.0 ng/mL 0.5        Impression and Plan:   69 year old man with:  1.  Advanced prostate cancer with disease to the bone diagnosed in 2019.  He has Castration-resistant at this time.  He has tolerated Zytiga without any major complications and continues to experience excellent PSA response.  PSA is down to 0.5 at this time.  Risks and benefits of continuing this therapy long-term was reviewed.  Potential complications as well as alternative therapies were reiterated.  Long-term issues such as renal sufficiency, hypertension and edema were also reviewed.  Plan to repeat imaging studies with next visit.  He is agreeable to continue with this plan at this time.   2.  Androgen deprivation therapy: He will receive Eligard today and repeated in 4 months.  Long-term complications including osteoporosis and weight gain among others were reviewed.  3.  Bone directed therapy: He is currently on calcium and vitamin D which I recommended continuing for the time being.  Delton See has been deferred at this time.   4.  Neuropathic pain: Currently on Tylenol and periodically oxycodone.  This is related to  previous chemotherapy.  5.  IV access: Port-A-Cath currently in place and will be flushed periodically.  6.  Prognosis and goals of care: Therapy remains palliative at this time although aggressive measures are warranted.  7.  Follow-up: She will return in 4 months for repeat evaluation.   30 minutes was spent on this encounter.  Time was dedicated to reviewing laboratory data, disease status update, treatment options and addressing complications related to therapy.  Zola Button, MD 1/15/20218:17 AM

## 2019-02-01 NOTE — Patient Instructions (Signed)
Leuprolide injection What is this medicine? LEUPROLIDE (loo PROE lide) is a man-made hormone. It is used to treat the symptoms of prostate cancer. This medicine may also be used to treat children with early onset of puberty. It may be used for other hormonal conditions. This medicine may be used for other purposes; ask your health care provider or pharmacist if you have questions. COMMON BRAND NAME(S): Lupron What should I tell my health care provider before I take this medicine? They need to know if you have any of these conditions:  diabetes  heart disease or previous heart attack  high blood pressure  high cholesterol  pain or difficulty passing urine  spinal cord metastasis  stroke  tobacco smoker  an unusual or allergic reaction to leuprolide, benzyl alcohol, other medicines, foods, dyes, or preservatives  pregnant or trying to get pregnant  breast-feeding How should I use this medicine? This medicine is for injection under the skin or into a muscle. You will be taught how to prepare and give this medicine. Use exactly as directed. Take your medicine at regular intervals. Do not take your medicine more often than directed. It is important that you put your used needles and syringes in a special sharps container. Do not put them in a trash can. If you do not have a sharps container, call your pharmacist or healthcare provider to get one. A special MedGuide will be given to you by the pharmacist with each prescription and refill. Be sure to read this information carefully each time. Talk to your pediatrician regarding the use of this medicine in children. While this medicine may be prescribed for children as young as 8 years for selected conditions, precautions do apply. Overdosage: If you think you have taken too much of this medicine contact a poison control center or emergency room at once. NOTE: This medicine is only for you. Do not share this medicine with others. What if  I miss a dose? If you miss a dose, take it as soon as you can. If it is almost time for your next dose, take only that dose. Do not take double or extra doses. What may interact with this medicine? Do not take this medicine with any of the following medications:  chasteberry This medicine may also interact with the following medications:  herbal or dietary supplements, like black cohosh or DHEA  male hormones, like estrogens or progestins and birth control pills, patches, rings, or injections  male hormones, like testosterone This list may not describe all possible interactions. Give your health care provider a list of all the medicines, herbs, non-prescription drugs, or dietary supplements you use. Also tell them if you smoke, drink alcohol, or use illegal drugs. Some items may interact with your medicine. What should I watch for while using this medicine? Visit your doctor or health care professional for regular checks on your progress. During the first week, your symptoms may get worse, but then will improve as you continue your treatment. You may get hot flashes, increased bone pain, increased difficulty passing urine, or an aggravation of nerve symptoms. Discuss these effects with your doctor or health care professional, some of them may improve with continued use of this medicine. Male patients may experience a menstrual cycle or spotting during the first 2 months of therapy with this medicine. If this continues, contact your doctor or health care professional. This medicine may increase blood sugar. Ask your healthcare provider if changes in diet or medicines are needed if   you have diabetes. What side effects may I notice from receiving this medicine? Side effects that you should report to your doctor or health care professional as soon as possible:  allergic reactions like skin rash, itching or hives, swelling of the face, lips, or tongue  breathing problems  chest  pain  depression or memory disorders  pain in your legs or groin  pain at site where injected  severe headache  signs and symptoms of high blood sugar such as being more thirsty or hungry or having to urinate more than normal. You may also feel very tired or have blurry vision  swelling of the feet and legs  visual changes  vomiting Side effects that usually do not require medical attention (report to your doctor or health care professional if they continue or are bothersome):  breast swelling or tenderness  decrease in sex drive or performance  diarrhea  hot flashes  loss of appetite  muscle, joint, or bone pains  nausea  redness or irritation at site where injected  skin problems or acne This list may not describe all possible side effects. Call your doctor for medical advice about side effects. You may report side effects to FDA at 1-800-FDA-1088. Where should I keep my medicine? Keep out of the reach of children. Store below 25 degrees C (77 degrees F). Do not freeze. Protect from light. Do not use if it is not clear or if there are particles present. Throw away any unused medicine after the expiration date. NOTE: This sheet is a summary. It may not cover all possible information. If you have questions about this medicine, talk to your doctor, pharmacist, or health care provider.  2020 Elsevier/Gold Standard (2017-11-02 09:52:48)  

## 2019-02-01 NOTE — Progress Notes (Signed)
02/01/19  Confirmed dose of Eligard to be 45 mg and will be given every 6 months.  T.O. Dr Creta Levin, PharmD

## 2019-03-28 DIAGNOSIS — L820 Inflamed seborrheic keratosis: Secondary | ICD-10-CM | POA: Diagnosis not present

## 2019-03-28 DIAGNOSIS — L918 Other hypertrophic disorders of the skin: Secondary | ICD-10-CM | POA: Diagnosis not present

## 2019-03-28 DIAGNOSIS — D0421 Carcinoma in situ of skin of right ear and external auricular canal: Secondary | ICD-10-CM | POA: Diagnosis not present

## 2019-03-28 DIAGNOSIS — Z85828 Personal history of other malignant neoplasm of skin: Secondary | ICD-10-CM | POA: Diagnosis not present

## 2019-03-28 DIAGNOSIS — L570 Actinic keratosis: Secondary | ICD-10-CM | POA: Diagnosis not present

## 2019-03-28 DIAGNOSIS — C4442 Squamous cell carcinoma of skin of scalp and neck: Secondary | ICD-10-CM | POA: Diagnosis not present

## 2019-04-15 ENCOUNTER — Other Ambulatory Visit: Payer: Self-pay | Admitting: Oncology

## 2019-05-02 ENCOUNTER — Other Ambulatory Visit: Payer: Self-pay | Admitting: Oncology

## 2019-05-02 DIAGNOSIS — C61 Malignant neoplasm of prostate: Secondary | ICD-10-CM

## 2019-05-07 ENCOUNTER — Other Ambulatory Visit: Payer: Self-pay | Admitting: Oncology

## 2019-05-15 ENCOUNTER — Other Ambulatory Visit: Payer: Self-pay | Admitting: Oncology

## 2019-05-30 ENCOUNTER — Other Ambulatory Visit: Payer: Self-pay | Admitting: Oncology

## 2019-05-30 ENCOUNTER — Other Ambulatory Visit: Payer: Self-pay

## 2019-05-30 ENCOUNTER — Ambulatory Visit (HOSPITAL_COMMUNITY)
Admission: RE | Admit: 2019-05-30 | Discharge: 2019-05-30 | Disposition: A | Discharge status: Home / Self Care | Payer: Medicare Other | Source: Ambulatory Visit | Attending: Oncology | Admitting: Oncology

## 2019-05-30 ENCOUNTER — Inpatient Hospital Stay: Payer: Medicare Other | Attending: Oncology

## 2019-05-30 ENCOUNTER — Encounter (HOSPITAL_COMMUNITY)
Admission: RE | Admit: 2019-05-30 | Discharge: 2019-05-30 | Disposition: A | Discharge status: Home / Self Care | Payer: Medicare Other | Source: Ambulatory Visit | Attending: Oncology | Admitting: Oncology

## 2019-05-30 DIAGNOSIS — Z8546 Personal history of malignant neoplasm of prostate: Secondary | ICD-10-CM | POA: Diagnosis not present

## 2019-05-30 DIAGNOSIS — M792 Neuralgia and neuritis, unspecified: Secondary | ICD-10-CM | POA: Insufficient documentation

## 2019-05-30 DIAGNOSIS — M25473 Effusion, unspecified ankle: Secondary | ICD-10-CM | POA: Insufficient documentation

## 2019-05-30 DIAGNOSIS — Z79899 Other long term (current) drug therapy: Secondary | ICD-10-CM | POA: Insufficient documentation

## 2019-05-30 DIAGNOSIS — C7951 Secondary malignant neoplasm of bone: Secondary | ICD-10-CM | POA: Diagnosis not present

## 2019-05-30 DIAGNOSIS — C61 Malignant neoplasm of prostate: Secondary | ICD-10-CM | POA: Insufficient documentation

## 2019-05-30 DIAGNOSIS — M7989 Other specified soft tissue disorders: Secondary | ICD-10-CM | POA: Insufficient documentation

## 2019-05-30 DIAGNOSIS — I700 Atherosclerosis of aorta: Secondary | ICD-10-CM | POA: Insufficient documentation

## 2019-05-30 DIAGNOSIS — I2510 Atherosclerotic heart disease of native coronary artery without angina pectoris: Secondary | ICD-10-CM | POA: Insufficient documentation

## 2019-05-30 DIAGNOSIS — K5730 Diverticulosis of large intestine without perforation or abscess without bleeding: Secondary | ICD-10-CM | POA: Insufficient documentation

## 2019-05-30 DIAGNOSIS — Z7982 Long term (current) use of aspirin: Secondary | ICD-10-CM | POA: Diagnosis not present

## 2019-05-30 LAB — CBC WITH DIFFERENTIAL (CANCER CENTER ONLY)
Abs Immature Granulocytes: 0.04 10*3/uL (ref 0.00–0.07)
Basophils Absolute: 0 10*3/uL (ref 0.0–0.1)
Basophils Relative: 0 %
Eosinophils Absolute: 0.2 10*3/uL (ref 0.0–0.5)
Eosinophils Relative: 2 %
HCT: 40.7 % (ref 39.0–52.0)
Hemoglobin: 14.2 g/dL (ref 13.0–17.0)
Immature Granulocytes: 1 %
Lymphocytes Relative: 19 %
Lymphs Abs: 1.7 10*3/uL (ref 0.7–4.0)
MCH: 30.7 pg (ref 26.0–34.0)
MCHC: 34.9 g/dL (ref 30.0–36.0)
MCV: 87.9 fL (ref 80.0–100.0)
Monocytes Absolute: 1 10*3/uL (ref 0.1–1.0)
Monocytes Relative: 11 %
Neutro Abs: 5.8 10*3/uL (ref 1.7–7.7)
Neutrophils Relative %: 67 %
Platelet Count: 249 10*3/uL (ref 150–400)
RBC: 4.63 MIL/uL (ref 4.22–5.81)
RDW: 12.1 % (ref 11.5–15.5)
WBC Count: 8.7 10*3/uL (ref 4.0–10.5)
nRBC: 0 % (ref 0.0–0.2)

## 2019-05-30 LAB — CMP (CANCER CENTER ONLY)
ALT: 32 U/L (ref 0–44)
AST: 25 U/L (ref 15–41)
Albumin: 4 g/dL (ref 3.5–5.0)
Alkaline Phosphatase: 105 U/L (ref 38–126)
Anion gap: 12 (ref 5–15)
BUN: 16 mg/dL (ref 8–23)
CO2: 26 mmol/L (ref 22–32)
Calcium: 9.5 mg/dL (ref 8.9–10.3)
Chloride: 105 mmol/L (ref 98–111)
Creatinine: 1.05 mg/dL (ref 0.61–1.24)
GFR, Est AFR Am: >60 mL/min (ref >60)
GFR, Estimated: >60 mL/min (ref >60)
Glucose, Bld: 142 mg/dL — ABNORMAL HIGH (ref 70–99)
Potassium: 3.4 mmol/L — ABNORMAL LOW (ref 3.5–5.1)
Sodium: 143 mmol/L (ref 135–145)
Total Bilirubin: 0.5 mg/dL (ref 0.3–1.2)
Total Protein: 6.5 g/dL (ref 6.5–8.1)

## 2019-05-30 MED ORDER — SODIUM CHLORIDE (PF) 0.9 % IJ SOLN
INTRAMUSCULAR | Status: AC
  Filled 2019-05-30: qty 50

## 2019-05-30 MED ORDER — IOHEXOL 300 MG/ML  SOLN
100.0000 mL | Freq: Once | INTRAMUSCULAR | Status: AC | PRN
  Administered 2019-05-30: 100 mL via INTRAVENOUS

## 2019-05-30 MED ORDER — TECHNETIUM TC 99M MEDRONATE IV KIT
17.7000 | PACK | Freq: Once | INTRAVENOUS | Status: AC
  Administered 2019-05-30: 17.7 via INTRAVENOUS

## 2019-05-31 ENCOUNTER — Other Ambulatory Visit: Payer: Medicare Other

## 2019-05-31 LAB — PROSTATE-SPECIFIC AG, SERUM (LABCORP): Prostate Specific Ag, Serum: 0.4 ng/mL (ref 0.0–4.0)

## 2019-06-04 ENCOUNTER — Other Ambulatory Visit: Payer: Self-pay

## 2019-06-04 ENCOUNTER — Inpatient Hospital Stay: Payer: Medicare Other | Admitting: Oncology

## 2019-06-04 ENCOUNTER — Inpatient Hospital Stay: Payer: Medicare Other

## 2019-06-04 VITALS — BP 138/67 | HR 56 | Temp 98.0°F | Resp 17 | Ht 70.0 in | Wt 210.1 lb

## 2019-06-04 DIAGNOSIS — I700 Atherosclerosis of aorta: Secondary | ICD-10-CM | POA: Diagnosis not present

## 2019-06-04 DIAGNOSIS — C61 Malignant neoplasm of prostate: Secondary | ICD-10-CM | POA: Diagnosis not present

## 2019-06-04 DIAGNOSIS — M25473 Effusion, unspecified ankle: Secondary | ICD-10-CM | POA: Diagnosis not present

## 2019-06-04 DIAGNOSIS — M7989 Other specified soft tissue disorders: Secondary | ICD-10-CM | POA: Diagnosis not present

## 2019-06-04 DIAGNOSIS — I2510 Atherosclerotic heart disease of native coronary artery without angina pectoris: Secondary | ICD-10-CM | POA: Diagnosis not present

## 2019-06-04 DIAGNOSIS — K5730 Diverticulosis of large intestine without perforation or abscess without bleeding: Secondary | ICD-10-CM | POA: Diagnosis not present

## 2019-06-04 DIAGNOSIS — M792 Neuralgia and neuritis, unspecified: Secondary | ICD-10-CM | POA: Diagnosis not present

## 2019-06-04 DIAGNOSIS — Z79899 Other long term (current) drug therapy: Secondary | ICD-10-CM | POA: Diagnosis not present

## 2019-06-04 DIAGNOSIS — C7951 Secondary malignant neoplasm of bone: Secondary | ICD-10-CM | POA: Diagnosis not present

## 2019-06-04 DIAGNOSIS — Z7982 Long term (current) use of aspirin: Secondary | ICD-10-CM | POA: Diagnosis not present

## 2019-06-04 NOTE — Progress Notes (Signed)
Hematology and Oncology Follow Up Visit  Dennis Patel [id number] 08/07/1950 69 y.o. 06/04/2019 9:12 AM Dennis Patel, Dennis, [name], Dennis Patel, Dennis Patel   Principle Diagnosis: 69 year old man with advanced prostate cancer with disease to the bone presented with Gleason score 4+4 = 8 and a PSA of 48 and advanced disease in 2019.  He has castration hyper resistant disease.  Prior Therapy:  Prostate biopsy obtained on December 05, 2017 showed a Gleason score of 4+4 = 8.  Taxotere 75 mg/m every 3 weeks started on January 31, 2018.  He completed 5 cycles of therapy in April 2020.  Current therapy:   Lupron 45 mg every 6 months starting in July 2020.  He received Eligard in January 2021.  Zytiga 1000 mg daily with prednisone 5 mg daily started in May 2020.   Interim History: Dennis Patel returns today for a follow-up visit.  Since the last visit, he reports feeling well without any complaints.  He continues to tolerate Zytiga and has not reported any new complaints.  He denies any nausea vomiting or abdominal pain.  He denies excessive fatigue or tiredness.  He denies any bone pain or pathological fractures.  He does report some ankle swelling slightly but overall improved quality of life.  He has intentionally lost weight close to 25 pounds.                       Medications: Updated on review. Current Outpatient Medications  Medication Sig Dispense Refill  . acetaminophen (TYLENOL) 325 MG tablet Take 650 mg by mouth every 6 (six) hours as needed.    Marland Kitchen amLODipine (NORVASC) 10 MG tablet Take 1 tablet (10 mg total) by mouth daily. 30 tablet 0  . aspirin EC 81 MG tablet Take 81 mg by mouth daily.    Marland Kitchen atorvastatin (LIPITOR) 10 MG tablet TAKE 1 TABLET BY MOUTH DAILY 90 tablet 3  . calcium-vitamin D (OSCAL WITH D) 250-125 MG-UNIT tablet Take 1 tablet by mouth daily.    . clopidogrel (PLAVIX) 75 MG tablet TAKE 1 TABLET BY MOUTH EVERY DAY 90 tablet 3  . famotidine (PEPCID) 10 MG  tablet Take 10 mg by mouth daily.    . furosemide (LASIX) 20 MG tablet TAKE 2 TABLETS BY MOUTH EVERY DAY 60 tablet 0  . gabapentin (NEURONTIN) 300 MG capsule TAKE 1 CAPSULE BY MOUTH EVERYDAY AT BEDTIME 90 capsule 1  . hydrochlorothiazide (HYDRODIURIL) 12.5 MG tablet Take 12.5 mg by mouth every morning.    . lidocaine-prilocaine (EMLA) cream Apply 1 application topically as needed. 30 g 0  . Multiple Vitamins-Minerals (ONE DAILY MULTIVITAMIN MEN PO) Take 1 tablet by mouth daily.    Marland Kitchen oxyCODONE (OXY IR/ROXICODONE) 5 MG immediate release tablet Take 1 tablet (5 mg total) by mouth every 4 (four) hours as needed for severe pain. (Patient not taking: Reported on 02/01/2019) 90 tablet 0  . predniSONE (DELTASONE) 5 MG tablet TAKE 1 TABLET BY MOUTH EVERY DAY WITH BREAKFAST 90 tablet 3  . promethazine (PHENERGAN) 25 MG tablet Take 1 tablet (25 mg total) by mouth every 6 (six) hours as needed for nausea or vomiting. (Patient not taking: Reported on 02/01/2019) 30 tablet 3  . tamsulosin (FLOMAX) 0.4 MG CAPS capsule     . traZODone (DESYREL) 50 MG tablet TAKE 1 TABLET (50 MG TOTAL) BY MOUTH AT BEDTIME AS NEEDED FOR SLEEP. 90 tablet 1  . ZYTIGA 250 MG tablet TAKE 4 TABLETS BY MOUTH ONCE DAILY AS DIRECTED.  TAKE 1 HOUR BEFORE OR 2 HOURS AFTER A MEAL 120 tablet 9   No current facility-administered medications for this visit.     Allergies: No Known Allergies    Physical Exam:          ECOG: 0    General appearance: Comfortable appearing without any discomfort Head: Normocephalic without any trauma Oropharynx: Mucous membranes are moist and pink without any thrush or ulcers. Eyes: Pupils are equal and round reactive to light. Lymph nodes: No cervical, supraclavicular, inguinal or axillary lymphadenopathy.   Heart:regular rate and rhythm.  S1 and S2 without leg edema. Lung: Clear without any rhonchi or wheezes.  No dullness to percussion. Abdomin: Soft, nontender, nondistended with good bowel  sounds.  No hepatosplenomegaly. Musculoskeletal: No joint deformity or effusion.  Full range of motion noted. Neurological: No deficits noted on motor, sensory and deep tendon reflex exam. Skin: No petechial rash or dryness.  Appeared moist.                 Lab Results: Lab Results  Component Value Date   WBC 8.7 05/30/2019   HGB 14.2 05/30/2019   HCT 40.7 05/30/2019   MCV 87.9 05/30/2019   PLT 249 05/30/2019     Chemistry      Component Value Date/Time   NA 143 05/30/2019 0739   K 3.4 (L) 05/30/2019 0739   CL 105 05/30/2019 0739   CO2 26 05/30/2019 0739   BUN 16 05/30/2019 0739   CREATININE 1.05 05/30/2019 0739      Component Value Date/Time   CALCIUM 9.5 05/30/2019 0739   ALKPHOS 105 05/30/2019 0739   AST 25 05/30/2019 0739   ALT 32 05/30/2019 0739   BILITOT 0.5 05/30/2019 0739      Results for Dennis Patel, Dennis Patel (MRN [id number]) as of 06/04/2019 09:12  Ref. Range 01/29/2019 08:42 05/30/2019 07:39  Prostate Specific Ag, Serum Latest Ref Range: 0.0 - 4.0 ng/mL 0.5 0.4    IMPRESSION: Interval decrease in degree of uptake of tracer at multiple sites of previously identified metastases as well as resolution of additional sites at T2, RIGHT femur and RIGHT costovertebral junctions in the midthoracic spine.  Overall findings represent interval response to therapy.  No new osseous metastatic lesions are identified scintigraphically.  IMPRESSION: 1. Diffuse osseous metastatic disease grossly similar to the prior examination. No extra skeletal metastatic disease noted in the chest, abdomen or pelvis. 2. The appearance of the lungs suggests interstitial lung disease, with a spectrum of findings considered indeterminate for usual interstitial pneumonia (UIP), overall stable compared to the prior study. At this time, this is considered most likely to represent nonspecific interstitial pneumonia (NSIP). 3. Colonic diverticulosis without evidence of acute  diverticulitis at this time. 4. Aortic atherosclerosis, in addition to left main and 3 vessel coronary artery disease. Assessment for potential risk factor modification, dietary therapy or pharmacologic therapy may be warranted, if clinically indicated. 5. There are calcifications of the aortic valve. Echocardiographic correlation for evaluation of potential valvular dysfunction may be warranted if clinically indicated.     Impression and Plan:   69 year old man with:  1.  Castration-resistant prostate cancer with disease to the bone since 2019.   His disease status was updated today including reviewing laboratory testing, CT scan and bone scan.  His PSA continues to show decline with imaging studies showed response to therapy overall with reduction in his disease metastasis.  He continues to tolerate Zytiga without any complaints.  Risks and benefits of  continuing this treatment long-term were reviewed.  Potential complications including hypertension, will hypokalemia among others were reiterated.  He is agreeable to continue at this time.   2.  Androgen deprivation therapy: He continues to tolerate Eligard reasonably well and will receive it every 6 months.  Next injection will be in July 2021.  Long-term complications including weight gain and hot flashes among others.  3.  Bone directed therapy: I recommended calcium and vitamin D supplements.  Delton See has been deferred for the time being.   4.  Neuropathic pain: Related to chemotherapy and remains manageable at this time.   5.  IV access: Port-A-Cath will continue to be flushed periodically.  6.  Prognosis and goals of care: His disease is incurable at this time although aggressive measures are warranted given his excellent performance status.  7.  Follow-up: He will continue to return every 2 months for Port-A-Cath flush and in 4 months for Dennis Patel follow-up.   30 minutes were dedicated to this encounter.  Time was spent on  updating his disease status, reviewing imaging studies and addressing complications related to therapy and future plan of care.  Dennis Button, Dennis Patel 5/18/20219:12 AM

## 2019-06-05 ENCOUNTER — Telehealth: Payer: Self-pay | Admitting: Oncology

## 2019-06-05 NOTE — Telephone Encounter (Signed)
Scheduled appt per 5/18 los.,  Was not able to reach pt, a calendar will be mailed out.

## 2019-07-29 ENCOUNTER — Other Ambulatory Visit: Payer: Self-pay | Admitting: Vascular Surgery

## 2019-08-05 ENCOUNTER — Other Ambulatory Visit: Payer: Self-pay

## 2019-08-05 ENCOUNTER — Inpatient Hospital Stay: Payer: Medicare Other

## 2019-08-05 ENCOUNTER — Inpatient Hospital Stay: Payer: Medicare Other | Attending: Oncology

## 2019-08-05 VITALS — BP 135/60 | HR 60 | Resp 18

## 2019-08-05 DIAGNOSIS — C61 Malignant neoplasm of prostate: Secondary | ICD-10-CM | POA: Diagnosis not present

## 2019-08-05 DIAGNOSIS — Z95828 Presence of other vascular implants and grafts: Secondary | ICD-10-CM

## 2019-08-05 MED ORDER — LEUPROLIDE ACETATE (6 MONTH) 45 MG ~~LOC~~ KIT
45.0000 mg | PACK | Freq: Once | SUBCUTANEOUS | Status: AC
  Administered 2019-08-05: 45 mg via SUBCUTANEOUS
  Filled 2019-08-05: qty 45

## 2019-08-05 MED ORDER — SODIUM CHLORIDE 0.9% FLUSH
10.0000 mL | INTRAVENOUS | Status: DC | PRN
  Administered 2019-08-05: 10 mL
  Filled 2019-08-05: qty 10

## 2019-08-05 MED ORDER — HEPARIN SOD (PORK) LOCK FLUSH 100 UNIT/ML IV SOLN
500.0000 [IU] | Freq: Once | INTRAVENOUS | Status: AC | PRN
  Administered 2019-08-05: 500 [IU]
  Filled 2019-08-05: qty 5

## 2019-08-05 NOTE — Patient Instructions (Signed)

## 2019-08-16 ENCOUNTER — Other Ambulatory Visit: Payer: Self-pay | Admitting: Vascular Surgery

## 2019-09-30 DIAGNOSIS — L821 Other seborrheic keratosis: Secondary | ICD-10-CM | POA: Diagnosis not present

## 2019-09-30 DIAGNOSIS — L570 Actinic keratosis: Secondary | ICD-10-CM | POA: Diagnosis not present

## 2019-09-30 DIAGNOSIS — Z85828 Personal history of other malignant neoplasm of skin: Secondary | ICD-10-CM | POA: Diagnosis not present

## 2019-09-30 DIAGNOSIS — D225 Melanocytic nevi of trunk: Secondary | ICD-10-CM | POA: Diagnosis not present

## 2019-10-01 ENCOUNTER — Other Ambulatory Visit: Payer: Self-pay

## 2019-10-01 ENCOUNTER — Inpatient Hospital Stay: Payer: Medicare Other

## 2019-10-01 ENCOUNTER — Inpatient Hospital Stay: Payer: Medicare Other | Attending: Oncology

## 2019-10-01 DIAGNOSIS — Z7982 Long term (current) use of aspirin: Secondary | ICD-10-CM | POA: Diagnosis not present

## 2019-10-01 DIAGNOSIS — Z452 Encounter for adjustment and management of vascular access device: Secondary | ICD-10-CM | POA: Insufficient documentation

## 2019-10-01 DIAGNOSIS — Z79899 Other long term (current) drug therapy: Secondary | ICD-10-CM | POA: Insufficient documentation

## 2019-10-01 DIAGNOSIS — E876 Hypokalemia: Secondary | ICD-10-CM | POA: Diagnosis not present

## 2019-10-01 DIAGNOSIS — C61 Malignant neoplasm of prostate: Secondary | ICD-10-CM

## 2019-10-01 DIAGNOSIS — Z95828 Presence of other vascular implants and grafts: Secondary | ICD-10-CM

## 2019-10-01 DIAGNOSIS — Z7952 Long term (current) use of systemic steroids: Secondary | ICD-10-CM | POA: Insufficient documentation

## 2019-10-01 LAB — CMP (CANCER CENTER ONLY)
ALT: 21 U/L (ref 0–44)
AST: 17 U/L (ref 15–41)
Albumin: 4.1 g/dL (ref 3.5–5.0)
Alkaline Phosphatase: 87 U/L (ref 38–126)
Anion gap: 8 (ref 5–15)
BUN: 14 mg/dL (ref 8–23)
CO2: 24 mmol/L (ref 22–32)
Calcium: 9.6 mg/dL (ref 8.9–10.3)
Chloride: 106 mmol/L (ref 98–111)
Creatinine: 0.85 mg/dL (ref 0.61–1.24)
GFR, Est AFR Am: >60 mL/min (ref >60)
GFR, Estimated: >60 mL/min (ref >60)
Glucose, Bld: 137 mg/dL — ABNORMAL HIGH (ref 70–99)
Potassium: 3.3 mmol/L — ABNORMAL LOW (ref 3.5–5.1)
Sodium: 138 mmol/L (ref 135–145)
Total Bilirubin: 0.6 mg/dL (ref 0.3–1.2)
Total Protein: 6.5 g/dL (ref 6.5–8.1)

## 2019-10-01 LAB — CBC WITH DIFFERENTIAL (CANCER CENTER ONLY)
Abs Immature Granulocytes: 0.03 10*3/uL (ref 0.00–0.07)
Basophils Absolute: 0 10*3/uL (ref 0.0–0.1)
Basophils Relative: 0 %
Eosinophils Absolute: 0.2 10*3/uL (ref 0.0–0.5)
Eosinophils Relative: 2 %
HCT: 38.9 % — ABNORMAL LOW (ref 39.0–52.0)
Hemoglobin: 13.6 g/dL (ref 13.0–17.0)
Immature Granulocytes: 0 %
Lymphocytes Relative: 13 %
Lymphs Abs: 1.3 10*3/uL (ref 0.7–4.0)
MCH: 31.1 pg (ref 26.0–34.0)
MCHC: 35 g/dL (ref 30.0–36.0)
MCV: 89 fL (ref 80.0–100.0)
Monocytes Absolute: 0.8 10*3/uL (ref 0.1–1.0)
Monocytes Relative: 8 %
Neutro Abs: 7.6 10*3/uL (ref 1.7–7.7)
Neutrophils Relative %: 77 %
Platelet Count: 222 10*3/uL (ref 150–400)
RBC: 4.37 MIL/uL (ref 4.22–5.81)
RDW: 11.9 % (ref 11.5–15.5)
WBC Count: 10 10*3/uL (ref 4.0–10.5)
nRBC: 0 % (ref 0.0–0.2)

## 2019-10-01 MED ORDER — SODIUM CHLORIDE 0.9% FLUSH
10.0000 mL | INTRAVENOUS | Status: DC | PRN
  Administered 2019-10-01: 10 mL
  Filled 2019-10-01: qty 10

## 2019-10-01 MED ORDER — HEPARIN SOD (PORK) LOCK FLUSH 100 UNIT/ML IV SOLN
500.0000 [IU] | Freq: Once | INTRAVENOUS | Status: DC
  Filled 2019-10-01: qty 5

## 2019-10-01 MED ORDER — SODIUM CHLORIDE 0.9% FLUSH
10.0000 mL | INTRAVENOUS | Status: DC | PRN
  Filled 2019-10-01: qty 10

## 2019-10-01 MED ORDER — HEPARIN SOD (PORK) LOCK FLUSH 100 UNIT/ML IV SOLN
500.0000 [IU] | Freq: Once | INTRAVENOUS | Status: AC | PRN
  Administered 2019-10-01: 500 [IU]
  Filled 2019-10-01: qty 5

## 2019-10-01 NOTE — Patient Instructions (Signed)

## 2019-10-02 LAB — PROSTATE-SPECIFIC AG, SERUM (LABCORP): Prostate Specific Ag, Serum: 0.4 ng/mL (ref 0.0–4.0)

## 2019-10-03 ENCOUNTER — Other Ambulatory Visit: Payer: Self-pay

## 2019-10-03 ENCOUNTER — Inpatient Hospital Stay (HOSPITAL_BASED_OUTPATIENT_CLINIC_OR_DEPARTMENT_OTHER): Payer: Medicare Other | Admitting: Oncology

## 2019-10-03 ENCOUNTER — Telehealth: Payer: Self-pay | Admitting: Oncology

## 2019-10-03 VITALS — BP 138/66 | HR 55 | Temp 97.8°F | Resp 18 | Ht 70.0 in | Wt 196.9 lb

## 2019-10-03 DIAGNOSIS — C61 Malignant neoplasm of prostate: Secondary | ICD-10-CM | POA: Diagnosis not present

## 2019-10-03 DIAGNOSIS — E876 Hypokalemia: Secondary | ICD-10-CM | POA: Diagnosis not present

## 2019-10-03 DIAGNOSIS — Z452 Encounter for adjustment and management of vascular access device: Secondary | ICD-10-CM | POA: Diagnosis not present

## 2019-10-03 DIAGNOSIS — Z79899 Other long term (current) drug therapy: Secondary | ICD-10-CM | POA: Diagnosis not present

## 2019-10-03 DIAGNOSIS — Z7982 Long term (current) use of aspirin: Secondary | ICD-10-CM | POA: Diagnosis not present

## 2019-10-03 DIAGNOSIS — Z7952 Long term (current) use of systemic steroids: Secondary | ICD-10-CM | POA: Diagnosis not present

## 2019-10-03 NOTE — Progress Notes (Signed)
Hematology and Oncology Follow Up Visit  Dennis Patel [id number] 1950/11/25 69 y.o. 10/03/2019 8:54 AM Dennis Patel, [name], [name], [name], MD   Principle Diagnosis: 69 year old man with castration-resistant prostate cancer diagnosed in 2019.  He presented with Gleason score 4+4 = 8 and a PSA of 48 with bone involvement at the time of diagnosis.  Prior Therapy:  Prostate biopsy obtained on December 05, 2017 showed a Gleason score of 4+4 = 8.  Taxotere 75 mg/m every 3 weeks started on January 31, 2018.  He completed 5 cycles of therapy in April 2020.  Current therapy:   Lupron 45 mg every 6 months starting in July 2020.  He received Eligard in January 2021.  Zytiga 1000 mg daily with prednisone 5 mg daily started in May 2020.   Interim History: Dennis Patel returns today for a follow-up visit.  Since last visit, he reports no major changes in his health without any major complications with Zytiga.  He denies any nausea, fatigue or shortness of breath.  He has intentionally lost close to 25 pounds.  Performance status and quality of life remained excellent.  He denies any worsening edema or bone pain.                      Medications: Unchanged on review. Current Outpatient Medications  Medication Sig Dispense Refill  . acetaminophen (TYLENOL) 325 MG tablet Take 650 mg by mouth every 6 (six) hours as needed.    Marland Kitchen amLODipine (NORVASC) 10 MG tablet Take 1 tablet (10 mg total) by mouth daily. 30 tablet 0  . aspirin EC 81 MG tablet Take 81 mg by mouth daily.    Marland Kitchen atorvastatin (LIPITOR) 10 MG tablet TAKE 1 TABLET BY MOUTH DAILY 90 tablet 3  . calcium-vitamin D (OSCAL WITH D) 250-125 MG-UNIT tablet Take 1 tablet by mouth daily.    . clopidogrel (PLAVIX) 75 MG tablet TAKE 1 TABLET BY MOUTH EVERY DAY 90 tablet 3  . famotidine (PEPCID) 10 MG tablet Take 10 mg by mouth daily.    . furosemide (LASIX) 20 MG tablet TAKE 2 TABLETS BY MOUTH EVERY DAY 60 tablet 0  . gabapentin  (NEURONTIN) 300 MG capsule TAKE 1 CAPSULE BY MOUTH EVERYDAY AT BEDTIME 90 capsule 1  . hydrochlorothiazide (HYDRODIURIL) 12.5 MG tablet Take 12.5 mg by mouth every morning.    . lidocaine-prilocaine (EMLA) cream Apply 1 application topically as needed. 30 g 0  . Multiple Vitamins-Minerals (ONE DAILY MULTIVITAMIN MEN PO) Take 1 tablet by mouth daily.    Marland Kitchen oxyCODONE (OXY IR/ROXICODONE) 5 MG immediate release tablet Take 1 tablet (5 mg total) by mouth every 4 (four) hours as needed for severe pain. (Patient not taking: Reported on 02/01/2019) 90 tablet 0  . predniSONE (DELTASONE) 5 MG tablet TAKE 1 TABLET BY MOUTH EVERY DAY WITH BREAKFAST 90 tablet 3  . promethazine (PHENERGAN) 25 MG tablet Take 1 tablet (25 mg total) by mouth every 6 (six) hours as needed for nausea or vomiting. (Patient not taking: Reported on 02/01/2019) 30 tablet 3  . tamsulosin (FLOMAX) 0.4 MG CAPS capsule     . traZODone (DESYREL) 50 MG tablet TAKE 1 TABLET (50 MG TOTAL) BY MOUTH AT BEDTIME AS NEEDED FOR SLEEP. 90 tablet 1  . ZYTIGA 250 MG tablet TAKE 4 TABLETS BY MOUTH ONCE DAILY AS DIRECTED.  TAKE 1 HOUR BEFORE OR 2 HOURS AFTER A MEAL 120 tablet 9   No current facility-administered medications for this visit.  Allergies: No Known Allergies    Physical Exam:      Blood pressure 138/66, pulse (!) 55, temperature 97.8 F (36.6 C), temperature source Tympanic, resp. rate 18, height 5\' 10"  (1.778 m), weight 196 lb 14.4 oz (89.3 kg), SpO2 100 %.      ECOG: 0   General appearance: Alert, awake without any distress. Head: Atraumatic without abnormalities Oropharynx: Without any thrush or ulcers. Eyes: No scleral icterus. Lymph nodes: No lymphadenopathy noted in the cervical, supraclavicular, or axillary nodes Heart:regular rate and rhythm, without any murmurs or gallops.   Lung: Clear to auscultation without any rhonchi, wheezes or dullness to percussion. Abdomin: Soft, nontender without any shifting dullness  or ascites. Musculoskeletal: No clubbing or cyanosis. Neurological: No motor or sensory deficits. Skin: No rashes or lesions.                Lab Results: Lab Results  Component Value Date   WBC 10.0 10/01/2019   HGB 13.6 10/01/2019   HCT 38.9 (L) 10/01/2019   MCV 89.0 10/01/2019   PLT 222 10/01/2019     Chemistry      Component Value Date/Time   NA 138 10/01/2019 0943   K 3.3 (L) 10/01/2019 0943   CL 106 10/01/2019 0943   CO2 24 10/01/2019 0943   BUN 14 10/01/2019 0943   CREATININE 0.85 10/01/2019 0943      Component Value Date/Time   CALCIUM 9.6 10/01/2019 0943   ALKPHOS 87 10/01/2019 0943   AST 17 10/01/2019 0943   ALT 21 10/01/2019 0943   BILITOT 0.6 10/01/2019 0943     Results for Dennis Patel (MRN [id number]) as of 10/03/2019 08:56  Ref. Range 10/19/2018 10:49 01/29/2019 08:42 05/30/2019 07:39 10/01/2019 09:43  Prostate Specific Ag, Serum Latest Ref Range: 0.0 - 4.0 ng/mL 1.2 0.5 0.4 0.4     Impression and Plan:   69 year old man with:  1.  Advanced prostate cancer with disease to the bone diagnosed in 2019.  He has castration-resistant at this time.  He continues to be on Zytiga with excellent PSA response as well as radiographic evidence of disease control.  Risks and benefits of continuing this treatment were discussed today.  Alternative treatment options were also reiterated including retreatment with systemic chemotherapy utilizing Jevtana.  He is agreeable to continue at this time.  2.  Androgen deprivation therapy: He received Eligard in July 2021 and will be repeated in 6 months from that date.  Complications including weight gain, hot flashes among others were reviewed.  3.  Bone directed therapy: He is on calcium and vitamin D supplements I recommended for the time being.  Delton See has been deferred at this time.   4.  Neuropathic pain: Pain has resolved at this time.   5.  IV access: Port-A-Cath currently in use will be flushed  periodically.  6.  Prognosis and goals of care: Therapy remains palliative although aggressive measures are warranted given his excellent performance status.  7.  Hypokalemia: Related to Zytiga.  I recommended increasing his potassium rich diet and will continue to monitor in the future.  8.  Follow-up: He will have MD follow-up with Eligard in 70-month.  He will continue to have Port-A-Cath flush every 2 months.  30 minutes were spent on this visit.  The time was dedicated to reviewing his disease status, discussing treatment options and future plan of care review.  Zola Button, MD 9/16/20218:54 AM

## 2019-10-03 NOTE — Telephone Encounter (Signed)
Scheduled per los. Gave avs and calendar  

## 2019-10-12 ENCOUNTER — Other Ambulatory Visit: Payer: Self-pay | Admitting: Oncology

## 2019-10-16 DIAGNOSIS — C61 Malignant neoplasm of prostate: Secondary | ICD-10-CM | POA: Diagnosis not present

## 2019-10-16 DIAGNOSIS — I999 Unspecified disorder of circulatory system: Secondary | ICD-10-CM | POA: Diagnosis not present

## 2019-10-16 DIAGNOSIS — R195 Other fecal abnormalities: Secondary | ICD-10-CM | POA: Diagnosis not present

## 2019-10-16 DIAGNOSIS — C7951 Secondary malignant neoplasm of bone: Secondary | ICD-10-CM | POA: Diagnosis not present

## 2019-12-03 ENCOUNTER — Inpatient Hospital Stay: Payer: Medicare Other | Attending: Oncology

## 2019-12-03 ENCOUNTER — Other Ambulatory Visit: Payer: Self-pay

## 2019-12-03 DIAGNOSIS — C61 Malignant neoplasm of prostate: Secondary | ICD-10-CM | POA: Diagnosis not present

## 2019-12-03 DIAGNOSIS — Z95828 Presence of other vascular implants and grafts: Secondary | ICD-10-CM

## 2019-12-03 DIAGNOSIS — Z452 Encounter for adjustment and management of vascular access device: Secondary | ICD-10-CM | POA: Insufficient documentation

## 2019-12-03 MED ORDER — HEPARIN SOD (PORK) LOCK FLUSH 100 UNIT/ML IV SOLN
500.0000 [IU] | Freq: Once | INTRAVENOUS | Status: AC | PRN
  Administered 2019-12-03: 500 [IU]
  Filled 2019-12-03: qty 5

## 2019-12-03 MED ORDER — SODIUM CHLORIDE 0.9% FLUSH
10.0000 mL | INTRAVENOUS | Status: DC | PRN
  Administered 2019-12-03: 10 mL
  Filled 2019-12-03: qty 10

## 2019-12-03 NOTE — Patient Instructions (Signed)

## 2019-12-24 ENCOUNTER — Other Ambulatory Visit: Payer: Self-pay | Admitting: Oncology

## 2020-01-11 IMAGING — MR MR PROSTATE WO/W CM
56 series · 56 of 56 positions shown · IV contrast (with contrast)
Comparison: None.

CLINICAL DATA: Elevated PSA level of 22.4. A prostate biopsy on
03/08/2017 was negative.

EXAM:
MR PROSTATE WITHOUT AND WITH CONTRAST
TECHNIQUE: Multiplanar multisequence MRI images were obtained of the pelvis
centered about the prostate. Pre and post contrast images were
obtained.
CONTRAST:  20mL MULTIHANCE GADOBENATE DIMEGLUMINE 529 MG/ML IV SOLN

[Series 4: T1 · axial · 8.0mm · 1.06mm/px · 1 of 28 slices shown (1 of 2)]
[im 1/28]
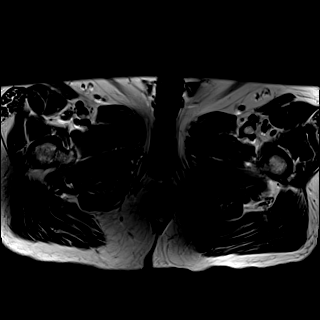

[Series 5: bSSFP fat-sat · axial · 8.0mm · 0.74mm/px · 1 of 28 slices shown]
[im 1/28]
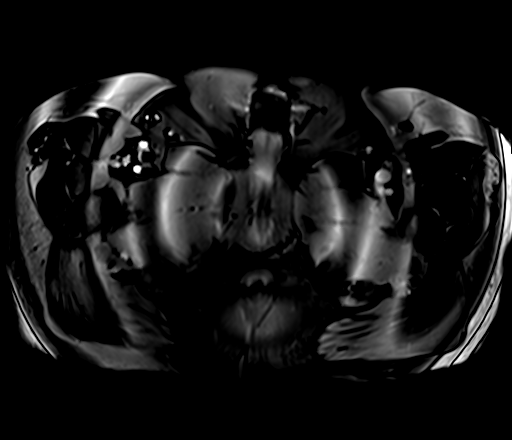

[Series 6: T2 · sagittal · 3.5mm · 0.56mm/px · 1 of 39 slices shown (1 of 5)]
[im 1/39]
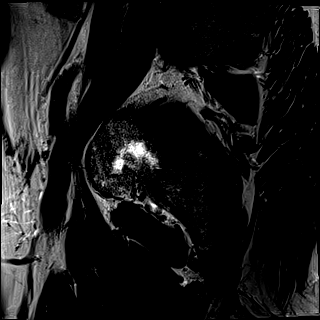

[Series 7: T1 · axial · 3.0mm · 0.31mm/px · 1 of 24 slices shown (2 of 2)]
[im 1/24]
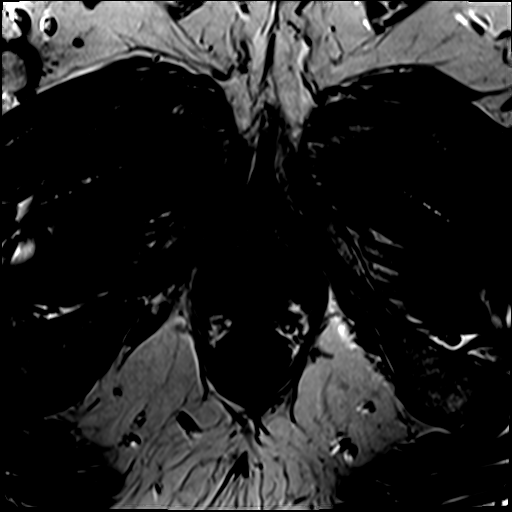

[Series 8: T2 · axial · 3.5mm · 0.56mm/px · 1 of 23 slices shown (2 of 5)]
[im 1/23]
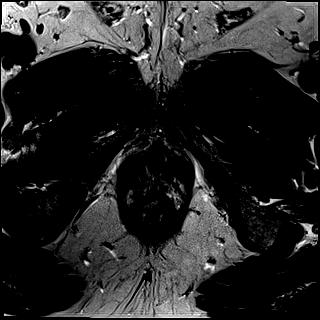

[Series 9: T2 · axial · 3.5mm · 0.56mm/px · 1 of 23 slices shown (3 of 5)]
[im 1/23]
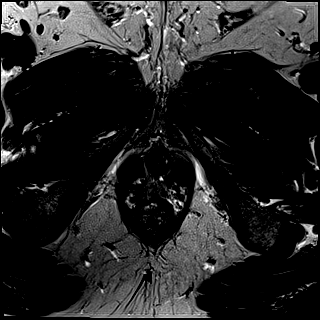

[Series 10: T2 · axial · 1.0mm · 1.04mm/px · 1 of 80 slices shown (4 of 5)]
[im 1/80]
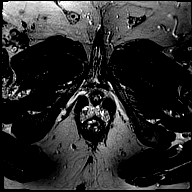

[Series 12: T2 · coronal · 3.5mm · 0.56mm/px · 1 of 23 slices shown (5 of 5)]
[im 1/23]
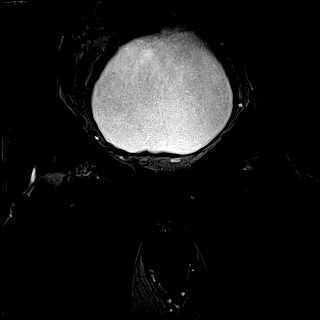

[Series 13: DWI · axial · 3.5mm · 1.56mm/px · 1 of 60 slices shown (1 of 2)]
[im 1/60]
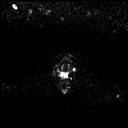

[Series 14: DWI · axial · 3.5mm · 1.56mm/px · 1 of 20 slices shown (2 of 2)]
[im 1/20]
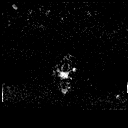

[Series 15: pre t1_twist_tra_dyn_ttc=6.4s · axial · non-contrast · 3.5mm · 0.83mm/px · 1 of 24 slices shown]
[im 1/24]
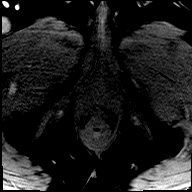

[Series 16: post t1_twist_tra_dyn-copy center · axial · 3.5mm · 0.83mm/px · 1 of 24 slices shown (1 of 23)]
[im 1/24]
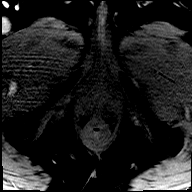

[Series 17: post t1_twist_tra_dyn-copy center · axial · 3.5mm · 0.83mm/px · 1 of 24 slices shown (2 of 23)]
[im 1/24]
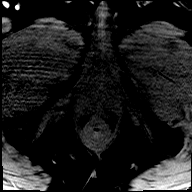

[Series 18: post t1_twist_tra_dyn-copy cent_sub_ttc=28.4s · axial · 3.5mm · 0.83mm/px · 1 of 23 slices shown]
[im 1/23]
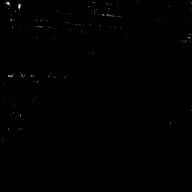

[Series 19: post t1_twist_tra_dyn-copy center · axial · 3.5mm · 0.83mm/px · 1 of 24 slices shown (3 of 23)]
[im 1/24]
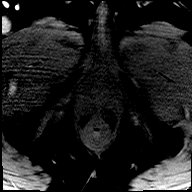

[Series 20: post t1_twist_tra_dyn-copy cent_sub_ttc=41.2s · axial · 3.5mm · 0.83mm/px · 1 of 23 slices shown]
[im 1/23]
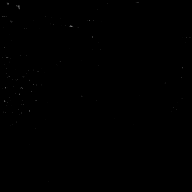

[Series 21: post t1_twist_tra_dyn-copy center · axial · 3.5mm · 0.83mm/px · 1 of 24 slices shown (4 of 23)]
[im 1/24]
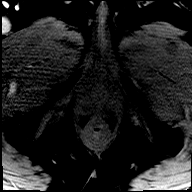

[Series 22: post t1_twist_tra_dyn-copy cent_sub_ttc=54.0s · axial · 3.5mm · 0.83mm/px · 1 of 23 slices shown]
[im 1/23]
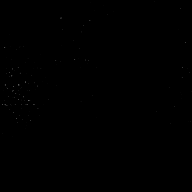

[Series 23: post t1_twist_tra_dyn-copy center · axial · 3.5mm · 0.83mm/px · 1 of 24 slices shown (5 of 23)]
[im 1/24]
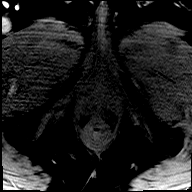

[Series 24: post t1_twist_tra_dyn-copy cent_sub_ttc=66.8s · axial · 3.5mm · 0.83mm/px · 1 of 24 slices shown]
[im 1/24]
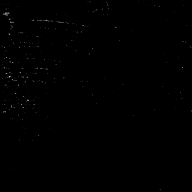

[Series 25: post t1_twist_tra_dyn-copy center · axial · 3.5mm · 0.83mm/px · 1 of 24 slices shown (6 of 23)]
[im 1/24]
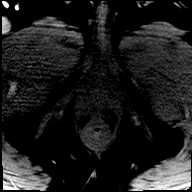

[Series 26: post t1_twist_tra_dyn-copy cent_sub_ttc=79.6s · axial · 3.5mm · 0.83mm/px · 1 of 24 slices shown]
[im 1/24]
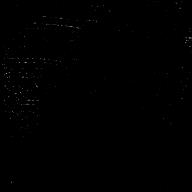

[Series 27: post t1_twist_tra_dyn-copy center · axial · 3.5mm · 0.83mm/px · 1 of 24 slices shown (7 of 23)]
[im 1/24]
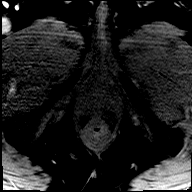

[Series 28: post t1_twist_tra_dyn-copy cent_sub_ttc=92.4s · axial · 3.5mm · 0.83mm/px · 1 of 24 slices shown]
[im 1/24]
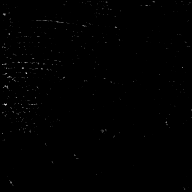

[Series 29: post t1_twist_tra_dyn-copy center · axial · 3.5mm · 0.83mm/px · 1 of 24 slices shown (8 of 23)]
[im 1/24]
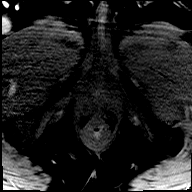

[Series 30: post t1_twist_tra_dyn-copy cent_sub_ttc=105.2s · axial · 3.5mm · 0.83mm/px · 1 of 24 slices shown]
[im 1/24]
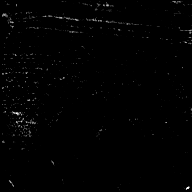

[Series 31: post t1_twist_tra_dyn-copy center · axial · 3.5mm · 0.83mm/px · 1 of 24 slices shown (9 of 23)]
[im 1/24]
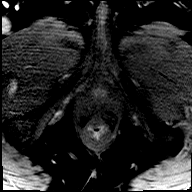

[Series 32: post t1_twist_tra_dyn-copy cent_sub_ttc=117.9s · axial · 3.5mm · 0.83mm/px · 1 of 24 slices shown]
[im 1/24]
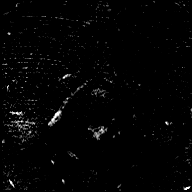

[Series 33: post t1_twist_tra_dyn-copy center · axial · 3.5mm · 0.83mm/px · 1 of 24 slices shown (10 of 23)]
[im 1/24]
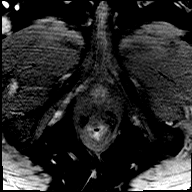

[Series 34: post t1_twist_tra_dyn-copy cent_sub_ttc=130.7s · axial · 3.5mm · 0.83mm/px · 1 of 24 slices shown]
[im 1/24]
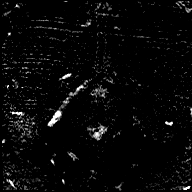

[Series 35: post t1_twist_tra_dyn-copy center · axial · 3.5mm · 0.83mm/px · 1 of 24 slices shown (11 of 23)]
[im 1/24]
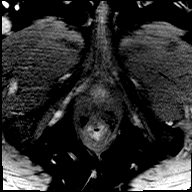

[Series 36: post t1_twist_tra_dyn-copy cent_sub_ttc=143.5s · axial · 3.5mm · 0.83mm/px · 1 of 24 slices shown]
[im 1/24]
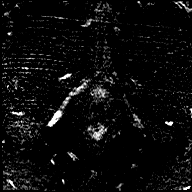

[Series 37: post t1_twist_tra_dyn-copy center · axial · 3.5mm · 0.83mm/px · 1 of 24 slices shown (12 of 23)]
[im 1/24]
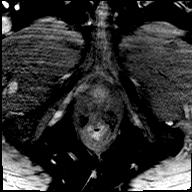

[Series 38: post t1_twist_tra_dyn-copy cent_sub_ttc=156.3s · axial · 3.5mm · 0.83mm/px · 1 of 24 slices shown]
[im 1/24]
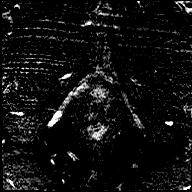

[Series 39: post t1_twist_tra_dyn-copy center · axial · 3.5mm · 0.83mm/px · 1 of 24 slices shown (13 of 23)]
[im 1/24]
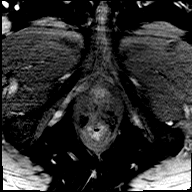

[Series 40: post t1_twist_tra_dyn-copy cent_sub_ttc=169.1s · axial · 3.5mm · 0.83mm/px · 1 of 24 slices shown]
[im 1/24]
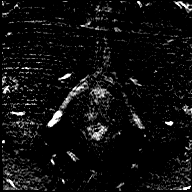

[Series 41: post t1_twist_tra_dyn-copy center · axial · 3.5mm · 0.83mm/px · 1 of 24 slices shown (14 of 23)]
[im 1/24]
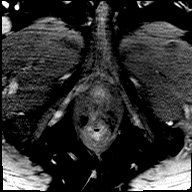

[Series 42: post t1_twist_tra_dyn-copy cent_sub_ttc=181.9s · axial · 3.5mm · 0.83mm/px · 1 of 24 slices shown]
[im 1/24]
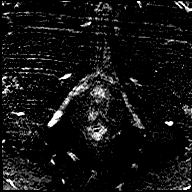

[Series 43: post t1_twist_tra_dyn-copy center · axial · 3.5mm · 0.83mm/px · 1 of 24 slices shown (15 of 23)]
[im 1/24]
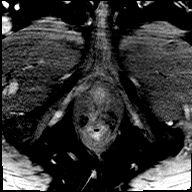

[Series 44: post t1_twist_tra_dyn-copy cent_sub_ttc=194.7s · axial · 3.5mm · 0.83mm/px · 1 of 24 slices shown]
[im 1/24]
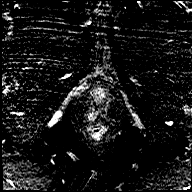

[Series 45: post t1_twist_tra_dyn-copy center · axial · 3.5mm · 0.83mm/px · 1 of 24 slices shown (16 of 23)]
[im 1/24]
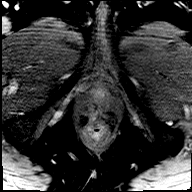

[Series 46: post t1_twist_tra_dyn-copy cent_sub_ttc=207.5s · axial · 3.5mm · 0.83mm/px · 1 of 24 slices shown]
[im 1/24]
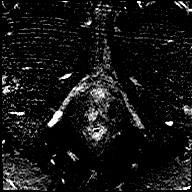

[Series 47: post t1_twist_tra_dyn-copy center · axial · 3.5mm · 0.83mm/px · 1 of 24 slices shown (17 of 23)]
[im 1/24]
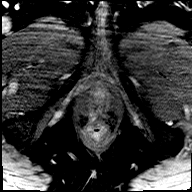

[Series 48: post t1_twist_tra_dyn-copy cent_sub_ttc=220.3s · axial · 3.5mm · 0.83mm/px · 1 of 24 slices shown]
[im 1/24]
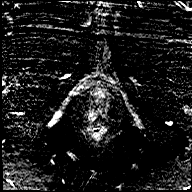

[Series 49: post t1_twist_tra_dyn-copy center · axial · 3.5mm · 0.83mm/px · 1 of 24 slices shown (18 of 23)]
[im 1/24]
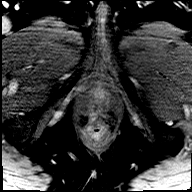

[Series 50: post t1_twist_tra_dyn-copy cent_sub_ttc=233.1s · axial · 3.5mm · 0.83mm/px · 1 of 24 slices shown]
[im 1/24]
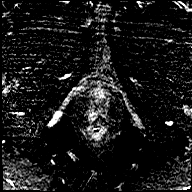

[Series 51: post t1_twist_tra_dyn-copy center · axial · 3.5mm · 0.83mm/px · 1 of 24 slices shown (19 of 23)]
[im 1/24]
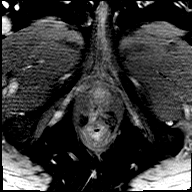

[Series 52: post t1_twist_tra_dyn-copy cent_sub_ttc=245.9s · axial · 3.5mm · 0.83mm/px · 1 of 24 slices shown]
[im 1/24]
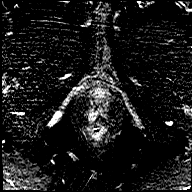

[Series 53: post t1_twist_tra_dyn-copy center · axial · 3.5mm · 0.83mm/px · 1 of 24 slices shown (20 of 23)]
[im 1/24]
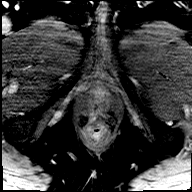

[Series 54: post t1_twist_tra_dyn-copy cent_sub_ttc=258.6s · axial · 3.5mm · 0.83mm/px · 1 of 24 slices shown]
[im 1/24]
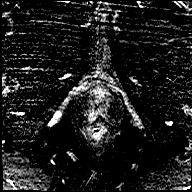

[Series 55: post t1_twist_tra_dyn-copy center · axial · 3.5mm · 0.83mm/px · 1 of 24 slices shown (21 of 23)]
[im 1/24]
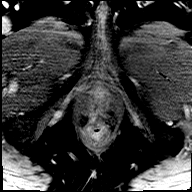

[Series 56: post t1_twist_tra_dyn-copy cent_sub_ttc=271.4s · axial · 3.5mm · 0.83mm/px · 1 of 24 slices shown]
[im 1/24]
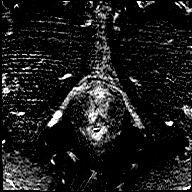

[Series 57: post t1_twist_tra_dyn-copy center · axial · 3.5mm · 0.83mm/px · 1 of 24 slices shown (22 of 23)]
[im 1/24]
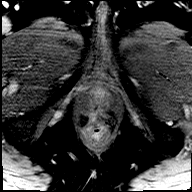

[Series 58: post t1_twist_tra_dyn-copy cent_sub_ttc=284.2s · axial · 3.5mm · 0.83mm/px · 1 of 24 slices shown]
[im 1/24]
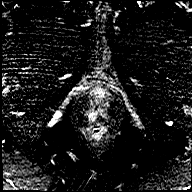

[Series 59: post t1_twist_tra_dyn-copy center · axial · 3.5mm · 0.83mm/px · 1 of 24 slices shown (23 of 23)]
[im 1/24]
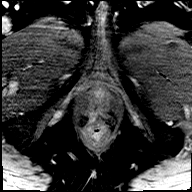

[Series 60: post t1_twist_tra_dyn-copy cent_sub_ttc=297.0s · axial · 3.5mm · 0.83mm/px · 1 of 24 slices shown]
[im 1/24]
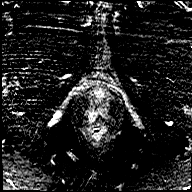

[56 of 56 positions shown; findings below may reference images not displayed]

FINDINGS: Prostate: A small PI-RADS category 4 lesion in the right base
posterior peripheral zone has hypointense T2 and ADC map activity,
isointense high B level diffusion-weighted imaging, and early focal
contrast enhancement. This lesion measures 0.53 cubic cm (1.8 by
by 0.8 cm).

A prominent median lobe along the prostate base indents the bladder
base and demonstrates early enhancement and some mild increase in
high B value diffusion weighted activity, but appears as a
circumscribed heterogeneous encapsulated nodule on the T2 weighted
images and accordingly is PI-RADS category 2.

Volume: 3D volumetric analysis: 127.12 cubic cm (6.4 by 5.9 by
cm).

Transcapsular spread:  Absent

Seminal vesicle involvement: Absent

Neurovascular bundle involvement: Absent

Pelvic adenopathy: Absent

Bone metastasis: Absent

Other findings: Sigmoid colon diverticulosis. Small posterior
bladder cellules.
IMPRESSION: 1. Small PI-RADS category 4 lesion in the right posterior base
peripheral zone, measuring 0.53 cubic cm. Targeting data for this
lesion was sent to [facility].
2. Nodularity along the median lobe with early enhancement and some
increase in B value, but appearing as a circumscribed heterogeneous
in capsulated nodule on T2 weighted images and accordingly
classified as PI-RADS category 2.
3. Prostatomegaly, prostate volume approximately 127 cubic cm.
4. Sigmoid colon diverticulosis.

## 2020-01-18 HISTORY — PX: COLONOSCOPY: SHX174

## 2020-01-27 DIAGNOSIS — Z0000 Encounter for general adult medical examination without abnormal findings: Secondary | ICD-10-CM | POA: Diagnosis not present

## 2020-01-27 DIAGNOSIS — I70219 Atherosclerosis of native arteries of extremities with intermittent claudication, unspecified extremity: Secondary | ICD-10-CM | POA: Diagnosis not present

## 2020-01-27 DIAGNOSIS — I10 Essential (primary) hypertension: Secondary | ICD-10-CM | POA: Diagnosis not present

## 2020-01-27 DIAGNOSIS — E7800 Pure hypercholesterolemia, unspecified: Secondary | ICD-10-CM | POA: Diagnosis not present

## 2020-02-03 ENCOUNTER — Other Ambulatory Visit: Payer: Self-pay | Admitting: Oncology

## 2020-02-03 DIAGNOSIS — C61 Malignant neoplasm of prostate: Secondary | ICD-10-CM

## 2020-02-04 ENCOUNTER — Other Ambulatory Visit: Payer: Self-pay

## 2020-02-04 ENCOUNTER — Inpatient Hospital Stay: Payer: Medicare Other

## 2020-02-04 ENCOUNTER — Inpatient Hospital Stay: Payer: Medicare Other | Attending: Oncology

## 2020-02-04 DIAGNOSIS — C7951 Secondary malignant neoplasm of bone: Secondary | ICD-10-CM | POA: Insufficient documentation

## 2020-02-04 DIAGNOSIS — Z79818 Long term (current) use of other agents affecting estrogen receptors and estrogen levels: Secondary | ICD-10-CM | POA: Insufficient documentation

## 2020-02-04 DIAGNOSIS — C61 Malignant neoplasm of prostate: Secondary | ICD-10-CM | POA: Diagnosis not present

## 2020-02-04 DIAGNOSIS — Z95828 Presence of other vascular implants and grafts: Secondary | ICD-10-CM

## 2020-02-04 LAB — CBC WITH DIFFERENTIAL (CANCER CENTER ONLY)
Abs Immature Granulocytes: 0.05 10*3/uL (ref 0.00–0.07)
Basophils Absolute: 0 10*3/uL (ref 0.0–0.1)
Basophils Relative: 0 %
Eosinophils Absolute: 0.1 10*3/uL (ref 0.0–0.5)
Eosinophils Relative: 1 %
HCT: 40.5 % (ref 39.0–52.0)
Hemoglobin: 14.1 g/dL (ref 13.0–17.0)
Immature Granulocytes: 1 %
Lymphocytes Relative: 12 %
Lymphs Abs: 1.2 10*3/uL (ref 0.7–4.0)
MCH: 31.3 pg (ref 26.0–34.0)
MCHC: 34.8 g/dL (ref 30.0–36.0)
MCV: 90 fL (ref 80.0–100.0)
Monocytes Absolute: 0.6 10*3/uL (ref 0.1–1.0)
Monocytes Relative: 5 %
Neutro Abs: 8.5 10*3/uL — ABNORMAL HIGH (ref 1.7–7.7)
Neutrophils Relative %: 81 %
Platelet Count: 217 10*3/uL (ref 150–400)
RBC: 4.5 MIL/uL (ref 4.22–5.81)
RDW: 12.1 % (ref 11.5–15.5)
WBC Count: 10.5 10*3/uL (ref 4.0–10.5)
nRBC: 0 % (ref 0.0–0.2)

## 2020-02-04 LAB — CMP (CANCER CENTER ONLY)
ALT: 30 U/L (ref 0–44)
AST: 26 U/L (ref 15–41)
Albumin: 4.2 g/dL (ref 3.5–5.0)
Alkaline Phosphatase: 79 U/L (ref 38–126)
Anion gap: 10 (ref 5–15)
BUN: 15 mg/dL (ref 8–23)
CO2: 26 mmol/L (ref 22–32)
Calcium: 9.4 mg/dL (ref 8.9–10.3)
Chloride: 105 mmol/L (ref 98–111)
Creatinine: 0.99 mg/dL (ref 0.61–1.24)
GFR, Estimated: >60 mL/min (ref >60)
Glucose, Bld: 140 mg/dL — ABNORMAL HIGH (ref 70–99)
Potassium: 3.5 mmol/L (ref 3.5–5.1)
Sodium: 141 mmol/L (ref 135–145)
Total Bilirubin: 0.9 mg/dL (ref 0.3–1.2)
Total Protein: 6.6 g/dL (ref 6.5–8.1)

## 2020-02-04 MED ORDER — SODIUM CHLORIDE 0.9% FLUSH
10.0000 mL | INTRAVENOUS | Status: DC | PRN
  Administered 2020-02-04: 10 mL
  Filled 2020-02-04: qty 10

## 2020-02-04 MED ORDER — HEPARIN SOD (PORK) LOCK FLUSH 100 UNIT/ML IV SOLN
500.0000 [IU] | Freq: Once | INTRAVENOUS | Status: AC | PRN
  Administered 2020-02-04: 500 [IU]
  Filled 2020-02-04: qty 5

## 2020-02-05 LAB — PROSTATE-SPECIFIC AG, SERUM (LABCORP): Prostate Specific Ag, Serum: 0.3 ng/mL (ref 0.0–4.0)

## 2020-02-06 ENCOUNTER — Inpatient Hospital Stay: Payer: Medicare Other

## 2020-02-06 ENCOUNTER — Inpatient Hospital Stay: Payer: Medicare Other | Admitting: Oncology

## 2020-02-06 ENCOUNTER — Other Ambulatory Visit: Payer: Self-pay

## 2020-02-06 VITALS — BP 150/64 | HR 69 | Temp 98.1°F | Resp 20 | Ht 70.0 in | Wt 202.0 lb

## 2020-02-06 DIAGNOSIS — Z95828 Presence of other vascular implants and grafts: Secondary | ICD-10-CM

## 2020-02-06 DIAGNOSIS — C61 Malignant neoplasm of prostate: Secondary | ICD-10-CM

## 2020-02-06 DIAGNOSIS — Z79818 Long term (current) use of other agents affecting estrogen receptors and estrogen levels: Secondary | ICD-10-CM | POA: Diagnosis not present

## 2020-02-06 DIAGNOSIS — C7951 Secondary malignant neoplasm of bone: Secondary | ICD-10-CM | POA: Diagnosis not present

## 2020-02-06 MED ORDER — GABAPENTIN 300 MG PO CAPS: 300.0000 mg | ORAL_CAPSULE | Freq: Three times a day (TID) | ORAL | 3 refills | Status: DC

## 2020-02-06 MED ORDER — LEUPROLIDE ACETATE (6 MONTH) 45 MG ~~LOC~~ KIT
45.0000 mg | PACK | Freq: Once | SUBCUTANEOUS | Status: AC
Start: 2020-02-06 — End: 2020-02-06
  Administered 2020-02-06: 45 mg via SUBCUTANEOUS
  Filled 2020-02-06: qty 45

## 2020-02-06 NOTE — Patient Instructions (Signed)

## 2020-02-06 NOTE — Progress Notes (Signed)
Hematology and Oncology Follow Up Visit  Dennis Patel [id number] 12/05/1950 70 y.o. 02/06/2020 9:34 AM Dennis Patel, L.Dennis Patel, [name], [name], MD   Principle Diagnosis: 70 year old man with advanced prostate cancer with disease to the bone diagnosed in 2019.  He has castration-resistant after presenting with Gleason score 4+4 = 8 and a PSA of 48 at that time.  Prior Therapy:  Prostate biopsy obtained on December 05, 2017 showed a Gleason score of 4+4 = 8.  Taxotere 75 mg/m every 3 weeks started on January 31, 2018.  He completed 5 cycles of therapy in April 2020.  Current therapy:   Lupron 45 mg every 6 months starting in July 2020.  He is due for Eligard today and repeated in 6 months.  Zytiga 1000 mg daily with prednisone 5 mg daily started in May 2020.   Interim History: Dennis Patel is here for repeat follow-up.  Since the last visit, he reports no major changes in his health.  He denies any recent hospitalization or illnesses.  He does report increased fatigue and tiredness associated with Zytiga.  He does report worsening neuropathy that is managed by Neurontin.  His performance status and quality of life remained stable continues to attempt activities of daily living.  He denies any nausea, vomiting or worsening edema.                      Medications: Updated on review. Current Outpatient Medications  Medication Sig Dispense Refill  . acetaminophen (TYLENOL) 325 MG tablet Take 650 mg by mouth every 6 (six) hours as needed.    Marland Kitchen amLODipine (NORVASC) 10 MG tablet Take 1 tablet (10 mg total) by mouth daily. 30 tablet 0  . aspirin EC 81 MG tablet Take 81 mg by mouth daily.    Marland Kitchen atorvastatin (LIPITOR) 10 MG tablet TAKE 1 TABLET BY MOUTH DAILY 90 tablet 3  . calcium-vitamin D (OSCAL WITH D) 250-125 MG-UNIT tablet Take 1 tablet by mouth daily.    . clopidogrel (PLAVIX) 75 MG tablet TAKE 1 TABLET BY MOUTH EVERY DAY 90 tablet 3  . famotidine (PEPCID) 10 MG tablet  Take 10 mg by mouth daily.    . furosemide (LASIX) 20 MG tablet TAKE 2 TABLETS BY MOUTH EVERY DAY 60 tablet 0  . gabapentin (NEURONTIN) 300 MG capsule TAKE 1 CAPSULE BY MOUTH EVERYDAY AT BEDTIME 90 capsule 1  . hydrochlorothiazide (HYDRODIURIL) 12.5 MG tablet Take 12.5 mg by mouth every morning.    . lidocaine-prilocaine (EMLA) cream Apply 1 application topically as needed. 30 g 0  . Multiple Vitamins-Minerals (ONE DAILY MULTIVITAMIN MEN PO) Take 1 tablet by mouth daily.    Marland Kitchen oxyCODONE (OXY IR/ROXICODONE) 5 MG immediate release tablet Take 1 tablet (5 mg total) by mouth every 4 (four) hours as needed for severe pain. 90 tablet 0  . predniSONE (DELTASONE) 5 MG tablet TAKE 1 TABLET BY MOUTH EVERY DAY WITH BREAKFAST 90 tablet 3  . promethazine (PHENERGAN) 25 MG tablet Take 1 tablet (25 mg total) by mouth every 6 (six) hours as needed for nausea or vomiting. 30 tablet 3  . tamsulosin (FLOMAX) 0.4 MG CAPS capsule     . traZODone (DESYREL) 50 MG tablet TAKE 1 TABLET (50 MG TOTAL) BY MOUTH AT BEDTIME AS NEEDED FOR SLEEP. 90 tablet 1  . ZYTIGA 250 MG tablet TAKE 4 TABLETS BY MOUTH ONCE DAILY AS DIRECTED.  TAKE 1 HOUR BEFORE OR 2 HOURS AFTER A MEAL 120 tablet 9  No current facility-administered medications for this visit.     Allergies: No Known Allergies    Physical Exam:      Blood pressure (!) 150/64, pulse 69, temperature 98.1 F (36.7 C), temperature source Tympanic, resp. rate 20, height 5\' 10"  (1.778 m), weight 202 lb (91.6 kg), SpO2 99 %.       ECOG: 0    General appearance: Comfortable appearing without any discomfort Head: Normocephalic without any trauma Oropharynx: Mucous membranes are moist and pink without any thrush or ulcers. Eyes: Pupils are equal and round reactive to light. Lymph nodes: No cervical, supraclavicular, inguinal or axillary lymphadenopathy.   Heart:regular rate and rhythm.  S1 and S2 without leg edema. Lung: Clear without any rhonchi or wheezes.  No  dullness to percussion. Abdomin: Soft, nontender, nondistended with good bowel sounds.  No hepatosplenomegaly. Musculoskeletal: No joint deformity or effusion.  Full range of motion noted. Neurological: No deficits noted on motor, sensory and deep tendon reflex exam. Skin: No petechial rash or dryness.  Appeared moist.                  Lab Results: Lab Results  Component Value Date   WBC 10.5 02/04/2020   HGB 14.1 02/04/2020   HCT 40.5 02/04/2020   MCV 90.0 02/04/2020   PLT 217 02/04/2020     Chemistry      Component Value Date/Time   NA 141 02/04/2020 1101   K 3.5 02/04/2020 1101   CL 105 02/04/2020 1101   CO2 26 02/04/2020 1101   BUN 15 02/04/2020 1101   CREATININE 0.99 02/04/2020 1101      Component Value Date/Time   CALCIUM 9.4 02/04/2020 1101   ALKPHOS 79 02/04/2020 1101   AST 26 02/04/2020 1101   ALT 30 02/04/2020 1101   BILITOT 0.9 02/04/2020 1101     Results for Dennis Patel (MRN [id number]) as of 02/06/2020 09:36  Ref. Range 10/01/2019 09:43 02/04/2020 11:01  Prostate Specific Ag, Serum Latest Ref Range: 0.0 - 4.0 ng/mL 0.4 0.3      Impression and Plan:   70 year old man with:  1.  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    The natural course of his disease was reviewed and treatment options were updated.  He is currently on Zytiga with excellent PSA response currently.  Risks and benefits of continuing this treatment long-term were discussed.  Potential complications including hypertension, hypokalemia, renal sufficiency among others were reviewed.  He is agreeable to continue at this time.  No need for dose reduction after review.  The plan is to update his staging scans before the next visit.  2.  Androgen deprivation therapy: He will receive Eligard today and repeated in 6 months.  Long-term complication of bleeding hypertension, weight gain and osteopenia.   3.  Bone directed  therapy: I recommended continuing calcium and vitamin D supplements with Delton See will be implemented if he has worsening bone metastasis and obtaining dental clearance.   4.  Neuropathic pain: Currently on Neurontin which has managed his pain although he does feel Neurontin and wanes during the day.  We will increase the dose to 300 mg 3 times a day.   5.  IV access: Port-A-Cath continues to be in place and flushed periodically.  6.  Prognosis and goals of care: His disease is incurable although aggressive measures are warranted.  He has excellent performance status.  7.  Hypokalemia: Resolved at this time.  His hypokalemia is related to [facility] we will continue to monitor.  8.  Follow-up: In 3 months for repeat evaluation.  30 minutes were dedicated to this encounter.  Time was spent on reviewing his disease status, reviewing laboratory data and future plan of care discussion.  Zola Button, MD 1/20/20229:34 AM

## 2020-03-02 DIAGNOSIS — Z01812 Encounter for preprocedural laboratory examination: Secondary | ICD-10-CM | POA: Diagnosis not present

## 2020-03-04 ENCOUNTER — Telehealth: Payer: Self-pay | Admitting: Oncology

## 2020-03-04 DIAGNOSIS — D124 Benign neoplasm of descending colon: Secondary | ICD-10-CM | POA: Diagnosis not present

## 2020-03-04 DIAGNOSIS — K648 Other hemorrhoids: Secondary | ICD-10-CM | POA: Diagnosis not present

## 2020-03-04 DIAGNOSIS — R195 Other fecal abnormalities: Secondary | ICD-10-CM | POA: Diagnosis not present

## 2020-03-04 DIAGNOSIS — K5730 Diverticulosis of large intestine without perforation or abscess without bleeding: Secondary | ICD-10-CM | POA: Diagnosis not present

## 2020-03-04 NOTE — Telephone Encounter (Signed)
Called pt per 2/15 sch msg - left message for pt to call back to reschedule appt.

## 2020-03-09 DIAGNOSIS — D124 Benign neoplasm of descending colon: Secondary | ICD-10-CM | POA: Diagnosis not present

## 2020-03-20 ENCOUNTER — Inpatient Hospital Stay: Payer: Medicare Other | Attending: Oncology

## 2020-03-20 ENCOUNTER — Other Ambulatory Visit: Payer: Self-pay

## 2020-03-20 DIAGNOSIS — Z452 Encounter for adjustment and management of vascular access device: Secondary | ICD-10-CM | POA: Diagnosis not present

## 2020-03-20 DIAGNOSIS — Z95828 Presence of other vascular implants and grafts: Secondary | ICD-10-CM

## 2020-03-20 DIAGNOSIS — C61 Malignant neoplasm of prostate: Secondary | ICD-10-CM | POA: Diagnosis not present

## 2020-03-20 MED ORDER — SODIUM CHLORIDE 0.9% FLUSH
10.0000 mL | INTRAVENOUS | Status: DC | PRN
  Administered 2020-03-20: 10 mL
  Filled 2020-03-20: qty 10

## 2020-03-20 MED ORDER — HEPARIN SOD (PORK) LOCK FLUSH 100 UNIT/ML IV SOLN
500.0000 [IU] | Freq: Once | INTRAVENOUS | Status: AC | PRN
  Administered 2020-03-20: 500 [IU]
  Filled 2020-03-20: qty 5

## 2020-03-20 NOTE — Patient Instructions (Signed)
Implanted Port Insertion, Care After This sheet gives you information about how to care for yourself after your procedure. Your health care provider may also give you more specific instructions. If you have problems or questions, contact your health care provider. What can I expect after the procedure? After the procedure, it is common to have:  Discomfort at the port insertion site.  Bruising on the skin over the port. This should improve over 3-4 days. Follow these instructions at home: Port care  After your port is placed, you will get a manufacturer's information card. The card has information about your port. Keep this card with you at all times.  Take care of the port as told by your health care provider. Ask your health care provider if you or a family member can get training for taking care of the port at home. A home health care nurse may also take care of the port.  Make sure to remember what type of port you have. Incision care  Follow instructions from your health care provider about how to take care of your port insertion site. Make sure you: ? Wash your hands with soap and water before and after you change your bandage (dressing). If soap and water are not available, use hand sanitizer. ? Change your dressing as told by your health care provider. ? Leave stitches (sutures), skin glue, or adhesive strips in place. These skin closures may need to stay in place for 2 weeks or longer. If adhesive strip edges start to loosen and curl up, you may trim the loose edges. Do not remove adhesive strips completely unless your health care provider tells you to do that.  Check your port insertion site every day for signs of infection. Check for: ? Redness, swelling, or pain. ? Fluid or blood. ? Warmth. ? Pus or a bad smell.      Activity  Return to your normal activities as told by your health care provider. Ask your health care provider what activities are safe for you.  Do not  lift anything that is heavier than 10 lb (4.5 kg), or the limit that you are told, until your health care provider says that it is safe. General instructions  Take over-the-counter and prescription medicines only as told by your health care provider.  Do not take baths, swim, or use a hot tub until your health care provider approves. Ask your health care provider if you may take showers. You may only be allowed to take sponge baths.  Do not drive for 24 hours if you were given a sedative during your procedure.  Wear a medical alert bracelet in case of an emergency. This will tell any health care providers that you have a port.  Keep all follow-up visits as told by your health care provider. This is important. Contact a health care provider if:  You cannot flush your port with saline as directed, or you cannot draw blood from the port.  You have a fever or chills.  You have redness, swelling, or pain around your port insertion site.  You have fluid or blood coming from your port insertion site.  Your port insertion site feels warm to the touch.  You have pus or a bad smell coming from the port insertion site. Get help right away if:  You have chest pain or shortness of breath.  You have bleeding from your port that you cannot control. Summary  Take care of the port as told by your   health care provider. Keep the manufacturer's information card with you at all times.  Change your dressing as told by your health care provider.  Contact a health care provider if you have a fever or chills or if you have redness, swelling, or pain around your port insertion site.  Keep all follow-up visits as told by your health care provider. This information is not intended to replace advice given to you by your health care provider. Make sure you discuss any questions you have with your health care provider. Document Revised: 08/01/2017 Document Reviewed: 08/01/2017 Elsevier Patient Education   2021 [employer]  

## 2020-03-30 DIAGNOSIS — L821 Other seborrheic keratosis: Secondary | ICD-10-CM | POA: Diagnosis not present

## 2020-03-30 DIAGNOSIS — D225 Melanocytic nevi of trunk: Secondary | ICD-10-CM | POA: Diagnosis not present

## 2020-03-30 DIAGNOSIS — L570 Actinic keratosis: Secondary | ICD-10-CM | POA: Diagnosis not present

## 2020-03-30 DIAGNOSIS — Z85828 Personal history of other malignant neoplasm of skin: Secondary | ICD-10-CM | POA: Diagnosis not present

## 2020-04-09 ENCOUNTER — Other Ambulatory Visit: Payer: Self-pay | Admitting: Oncology

## 2020-05-07 ENCOUNTER — Inpatient Hospital Stay: Payer: Medicare Other

## 2020-05-07 ENCOUNTER — Inpatient Hospital Stay: Payer: Medicare Other | Attending: Oncology

## 2020-05-07 ENCOUNTER — Other Ambulatory Visit: Payer: Self-pay

## 2020-05-07 DIAGNOSIS — G629 Polyneuropathy, unspecified: Secondary | ICD-10-CM | POA: Diagnosis not present

## 2020-05-07 DIAGNOSIS — Z192 Hormone resistant malignancy status: Secondary | ICD-10-CM | POA: Insufficient documentation

## 2020-05-07 DIAGNOSIS — C61 Malignant neoplasm of prostate: Secondary | ICD-10-CM | POA: Insufficient documentation

## 2020-05-07 DIAGNOSIS — Z79899 Other long term (current) drug therapy: Secondary | ICD-10-CM | POA: Insufficient documentation

## 2020-05-07 DIAGNOSIS — Z95828 Presence of other vascular implants and grafts: Secondary | ICD-10-CM

## 2020-05-07 DIAGNOSIS — E876 Hypokalemia: Secondary | ICD-10-CM | POA: Insufficient documentation

## 2020-05-07 DIAGNOSIS — Z7982 Long term (current) use of aspirin: Secondary | ICD-10-CM | POA: Insufficient documentation

## 2020-05-07 LAB — CBC WITH DIFFERENTIAL (CANCER CENTER ONLY)
Abs Immature Granulocytes: 0.03 10*3/uL (ref 0.00–0.07)
Basophils Absolute: 0 10*3/uL (ref 0.0–0.1)
Basophils Relative: 0 %
Eosinophils Absolute: 0.1 10*3/uL (ref 0.0–0.5)
Eosinophils Relative: 1 %
HCT: 42.3 % (ref 39.0–52.0)
Hemoglobin: 14.6 g/dL (ref 13.0–17.0)
Immature Granulocytes: 0 %
Lymphocytes Relative: 14 %
Lymphs Abs: 1.4 10*3/uL (ref 0.7–4.0)
MCH: 31 pg (ref 26.0–34.0)
MCHC: 34.5 g/dL (ref 30.0–36.0)
MCV: 89.8 fL (ref 80.0–100.0)
Monocytes Absolute: 0.6 10*3/uL (ref 0.1–1.0)
Monocytes Relative: 6 %
Neutro Abs: 8 10*3/uL — ABNORMAL HIGH (ref 1.7–7.7)
Neutrophils Relative %: 79 %
Platelet Count: 216 10*3/uL (ref 150–400)
RBC: 4.71 MIL/uL (ref 4.22–5.81)
RDW: 12.2 % (ref 11.5–15.5)
WBC Count: 10.1 10*3/uL (ref 4.0–10.5)
nRBC: 0 % (ref 0.0–0.2)

## 2020-05-07 LAB — CMP (CANCER CENTER ONLY)
ALT: 25 U/L (ref 0–44)
AST: 19 U/L (ref 15–41)
Albumin: 4.1 g/dL (ref 3.5–5.0)
Alkaline Phosphatase: 95 U/L (ref 38–126)
Anion gap: 12 (ref 5–15)
BUN: 12 mg/dL (ref 8–23)
CO2: 26 mmol/L (ref 22–32)
Calcium: 9.7 mg/dL (ref 8.9–10.3)
Chloride: 106 mmol/L (ref 98–111)
Creatinine: 0.86 mg/dL (ref 0.61–1.24)
GFR, Estimated: >60 mL/min (ref >60)
Glucose, Bld: 136 mg/dL — ABNORMAL HIGH (ref 70–99)
Potassium: 3.3 mmol/L — ABNORMAL LOW (ref 3.5–5.1)
Sodium: 144 mmol/L (ref 135–145)
Total Bilirubin: 0.9 mg/dL (ref 0.3–1.2)
Total Protein: 6.5 g/dL (ref 6.5–8.1)

## 2020-05-07 MED ORDER — SODIUM CHLORIDE 0.9% FLUSH
10.0000 mL | INTRAVENOUS | Status: DC | PRN
  Administered 2020-05-07: 10 mL
  Filled 2020-05-07: qty 10

## 2020-05-07 MED ORDER — HEPARIN SOD (PORK) LOCK FLUSH 100 UNIT/ML IV SOLN
500.0000 [IU] | Freq: Once | INTRAVENOUS | Status: AC | PRN
  Administered 2020-05-07: 500 [IU]
  Filled 2020-05-07: qty 5

## 2020-05-07 NOTE — Patient Instructions (Signed)
Implanted Port Insertion, Care After This sheet gives you information about how to care for yourself after your procedure. Your health care provider may also give you more specific instructions. If you have problems or questions, contact your health care provider. What can I expect after the procedure? After the procedure, it is common to have:  Discomfort at the port insertion site.  Bruising on the skin over the port. This should improve over 3-4 days. Follow these instructions at home: Port care  After your port is placed, you will get a manufacturer's information card. The card has information about your port. Keep this card with you at all times.  Take care of the port as told by your health care provider. Ask your health care provider if you or a family member can get training for taking care of the port at home. A home health care nurse may also take care of the port.  Make sure to remember what type of port you have. Incision care  Follow instructions from your health care provider about how to take care of your port insertion site. Make sure you: ? Wash your hands with soap and water before and after you change your bandage (dressing). If soap and water are not available, use hand sanitizer. ? Change your dressing as told by your health care provider. ? Leave stitches (sutures), skin glue, or adhesive strips in place. These skin closures may need to stay in place for 2 weeks or longer. If adhesive strip edges start to loosen and curl up, you may trim the loose edges. Do not remove adhesive strips completely unless your health care provider tells you to do that.  Check your port insertion site every day for signs of infection. Check for: ? Redness, swelling, or pain. ? Fluid or blood. ? Warmth. ? Pus or a bad smell.      Activity  Return to your normal activities as told by your health care provider. Ask your health care provider what activities are safe for you.  Do not  lift anything that is heavier than 10 lb (4.5 kg), or the limit that you are told, until your health care provider says that it is safe. General instructions  Take over-the-counter and prescription medicines only as told by your health care provider.  Do not take baths, swim, or use a hot tub until your health care provider approves. Ask your health care provider if you may take showers. You may only be allowed to take sponge baths.  Do not drive for 24 hours if you were given a sedative during your procedure.  Wear a medical alert bracelet in case of an emergency. This will tell any health care providers that you have a port.  Keep all follow-up visits as told by your health care provider. This is important. Contact a health care provider if:  You cannot flush your port with saline as directed, or you cannot draw blood from the port.  You have a fever or chills.  You have redness, swelling, or pain around your port insertion site.  You have fluid or blood coming from your port insertion site.  Your port insertion site feels warm to the touch.  You have pus or a bad smell coming from the port insertion site. Get help right away if:  You have chest pain or shortness of breath.  You have bleeding from your port that you cannot control. Summary  Take care of the port as told by your   health care provider. Keep the manufacturer's information card with you at all times.  Change your dressing as told by your health care provider.  Contact a health care provider if you have a fever or chills or if you have redness, swelling, or pain around your port insertion site.  Keep all follow-up visits as told by your health care provider. This information is not intended to replace advice given to you by your health care provider. Make sure you discuss any questions you have with your health care provider. Document Revised: 08/01/2017 Document Reviewed: 08/01/2017 Elsevier Patient Education   2021 [employer]  

## 2020-05-08 LAB — PROSTATE-SPECIFIC AG, SERUM (LABCORP): Prostate Specific Ag, Serum: 0.4 ng/mL (ref 0.0–4.0)

## 2020-05-13 ENCOUNTER — Ambulatory Visit (HOSPITAL_COMMUNITY)
Admission: RE | Admit: 2020-05-13 | Discharge: 2020-05-13 | Disposition: A | Discharge status: Home / Self Care | Payer: Medicare Other | Source: Ambulatory Visit | Attending: Oncology | Admitting: Oncology

## 2020-05-13 ENCOUNTER — Encounter (HOSPITAL_COMMUNITY): Payer: Self-pay

## 2020-05-13 ENCOUNTER — Other Ambulatory Visit: Payer: Self-pay

## 2020-05-13 DIAGNOSIS — K429 Umbilical hernia without obstruction or gangrene: Secondary | ICD-10-CM | POA: Diagnosis not present

## 2020-05-13 DIAGNOSIS — C61 Malignant neoplasm of prostate: Secondary | ICD-10-CM

## 2020-05-13 DIAGNOSIS — Z95828 Presence of other vascular implants and grafts: Secondary | ICD-10-CM

## 2020-05-13 DIAGNOSIS — N400 Enlarged prostate without lower urinary tract symptoms: Secondary | ICD-10-CM | POA: Diagnosis not present

## 2020-05-13 DIAGNOSIS — G35 Multiple sclerosis: Secondary | ICD-10-CM | POA: Diagnosis not present

## 2020-05-13 DIAGNOSIS — C7951 Secondary malignant neoplasm of bone: Secondary | ICD-10-CM | POA: Diagnosis not present

## 2020-05-13 MED ORDER — TECHNETIUM TC 99M MEDRONATE IV KIT
20.0000 | PACK | Freq: Once | INTRAVENOUS | Status: AC | PRN
  Administered 2020-05-13: 20 via INTRAVENOUS

## 2020-05-13 MED ORDER — IOHEXOL 300 MG/ML  SOLN
100.0000 mL | Freq: Once | INTRAMUSCULAR | Status: AC | PRN
  Administered 2020-05-13: 100 mL via INTRAVENOUS

## 2020-05-14 ENCOUNTER — Encounter (HOSPITAL_COMMUNITY): Payer: Self-pay

## 2020-05-14 ENCOUNTER — Inpatient Hospital Stay: Payer: Medicare Other | Admitting: Oncology

## 2020-05-14 VITALS — BP 156/59 | HR 64 | Resp 20 | Wt 204.4 lb

## 2020-05-14 DIAGNOSIS — Z79899 Other long term (current) drug therapy: Secondary | ICD-10-CM | POA: Diagnosis not present

## 2020-05-14 DIAGNOSIS — E876 Hypokalemia: Secondary | ICD-10-CM | POA: Diagnosis not present

## 2020-05-14 DIAGNOSIS — C61 Malignant neoplasm of prostate: Secondary | ICD-10-CM

## 2020-05-14 DIAGNOSIS — Z192 Hormone resistant malignancy status: Secondary | ICD-10-CM | POA: Diagnosis not present

## 2020-05-14 DIAGNOSIS — G629 Polyneuropathy, unspecified: Secondary | ICD-10-CM | POA: Diagnosis not present

## 2020-05-14 DIAGNOSIS — Z7982 Long term (current) use of aspirin: Secondary | ICD-10-CM | POA: Diagnosis not present

## 2020-05-14 NOTE — Progress Notes (Signed)
Hematology and Oncology Follow Up Visit  Dennis Patel [id number] 1950/03/16 70 y.o. 05/14/2020 8:36 AM Dennis Patel, Dennis, [name], [name], MD   Principle Diagnosis: 70 year old man with castration-resistant advanced prostate cancer with disease to the bone diagnosed in 2019.  He presented with Gleason score 4+4 = 8 and a PSA of 48.  Prior Therapy:  Prostate biopsy obtained on December 05, 2017 showed a Gleason score of 4+4 = 8.  Taxotere 75 mg/m every 3 weeks started on January 31, 2018.  He completed 5 cycles of therapy in April 2020.  Current therapy:   Lupron 45 mg every 6 months starting in July 2020.  He is due for Eligard today and repeated in 6 months.  Zytiga 1000 mg daily with prednisone 5 mg daily started in May 2020.   Interim History: Dennis Patel returns today for repeat evaluation.  Since the last visit, he reports no major changes in his health.  He continues to have residual neuropathy that is managed by gabapentin.  He denies any nausea, fatigue or worsening edema.  He continues to tolerate Zytiga without any new complaints.                      Medications: Unchanged on review. Current Outpatient Medications  Medication Sig Dispense Refill  . acetaminophen (TYLENOL) 325 MG tablet Take 650 mg by mouth every 6 (six) hours as needed.    Marland Kitchen amLODipine (NORVASC) 10 MG tablet Take 1 tablet (10 mg total) by mouth daily. 30 tablet 0  . aspirin EC 81 MG tablet Take 81 mg by mouth daily.    Marland Kitchen atorvastatin (LIPITOR) 10 MG tablet TAKE 1 TABLET BY MOUTH DAILY 90 tablet 3  . calcium-vitamin D (OSCAL WITH D) 250-125 MG-UNIT tablet Take 1 tablet by mouth daily.    . clopidogrel (PLAVIX) 75 MG tablet TAKE 1 TABLET BY MOUTH EVERY DAY 90 tablet 3  . famotidine (PEPCID) 10 MG tablet Take 10 mg by mouth daily.    . furosemide (LASIX) 20 MG tablet TAKE 2 TABLETS BY MOUTH EVERY DAY 60 tablet 0  . gabapentin (NEURONTIN) 300 MG capsule Take 1 capsule (300 mg total)  by mouth 3 (three) times daily. 90 capsule 3  . hydrochlorothiazide (HYDRODIURIL) 12.5 MG tablet Take 12.5 mg by mouth every morning.    . lidocaine-prilocaine (EMLA) cream Apply 1 application topically as needed. 30 g 0  . Multiple Vitamins-Minerals (ONE DAILY MULTIVITAMIN MEN PO) Take 1 tablet by mouth daily.    . predniSONE (DELTASONE) 5 MG tablet TAKE 1 TABLET BY MOUTH EVERY DAY WITH BREAKFAST 90 tablet 3  . promethazine (PHENERGAN) 25 MG tablet Take 1 tablet (25 mg total) by mouth every 6 (six) hours as needed for nausea or vomiting. 30 tablet 3  . tamsulosin (FLOMAX) 0.4 MG CAPS capsule     . traZODone (DESYREL) 50 MG tablet TAKE 1 TABLET (50 MG TOTAL) BY MOUTH AT BEDTIME AS NEEDED FOR SLEEP. 90 tablet 1  . ZYTIGA 250 MG tablet TAKE 4 TABLETS BY MOUTH ONCE DAILY AS DIRECTED.  TAKE 1 HOUR BEFORE OR 2 HOURS AFTER A MEAL 120 tablet 9   No current facility-administered medications for this visit.     Allergies: No Known Allergies    Physical Exam:       Blood pressure (!) 156/59, pulse 64, resp. rate 20, weight 204 lb 7 oz (92.7 kg), SpO2 100 %.       ECOG: 0  General appearance: Alert, awake without any distress. Head: Atraumatic without abnormalities Oropharynx: Without any thrush or ulcers. Eyes: No scleral icterus. Lymph nodes: No lymphadenopathy noted in the cervical, supraclavicular, or axillary nodes Heart:regular rate and rhythm, without any murmurs or gallops.   Lung: Clear to auscultation without any rhonchi, wheezes or dullness to percussion. Abdomin: Soft, nontender without any shifting dullness or ascites. Musculoskeletal: No clubbing or cyanosis. Neurological: No motor or sensory deficits. Skin: No rashes or lesions.                   Lab Results: Lab Results  Component Value Date   WBC 10.1 05/07/2020   HGB 14.6 05/07/2020   HCT 42.3 05/07/2020   MCV 89.8 05/07/2020   PLT 216 05/07/2020     Chemistry      Component Value  Date/Time   NA 144 05/07/2020 1139   K 3.3 (L) 05/07/2020 1139   CL 106 05/07/2020 1139   CO2 26 05/07/2020 1139   BUN 12 05/07/2020 1139   CREATININE 0.86 05/07/2020 1139      Component Value Date/Time   CALCIUM 9.7 05/07/2020 1139   ALKPHOS 95 05/07/2020 1139   AST 19 05/07/2020 1139   ALT 25 05/07/2020 1139   BILITOT 0.9 05/07/2020 1139       Results for Dennis Patel, Dennis Patel (MRN [id number]) as of 05/14/2020 08:35  Ref. Range 02/04/2020 11:01 05/07/2020 11:39  Prostate Specific Ag, Serum Latest Ref Range: 0.0 - 4.0 ng/mL 0.3 0.4     Impression and Plan:   70 year old man with:  1.  Advanced prostate cancer with disease to the bone diagnosed in 2019.  He has castration-resistant at this time.  His disease status was updated at this time and treatment options were reviewed.  He is PSA continues to be under reasonable control currently at 0.4.  CT scan and bone scan obtained on April 27 were personally reviewed and imaging studies were discussed with the patient although the final report is not available.  At this time, I recommended continuing Zytiga without any additional treatment at this time.  Alternative options such as Xofigo, PARP inhibitor as well as a lutetium could also be considered in the future.  He is agreeable to continue at this time.  2.  Androgen deprivation therapy: Next Eligard will be in July 2022.  Complications including weight gain, hot flashes and others were reviewed.   3.  Bone directed therapy: He is on calcium and vitamin D supplements and Delton See will be utilized if he has worsening disease progression.   4.  Neuropathic pain: He remains on Neurontin at 300mg  3 times a day.  This is managed well at this time without any need for any dose adjustment.   5.  IV access: Port-A-Cath continues to be in place and will be flushed periodically.  6.  Prognosis and goals of care: Therapy is palliative although aggressive measures are warranted given his  reasonable performance status.  7.  Hypokalemia: Related to Zytiga and overall stable.  We will continue to monitor.  8.  Follow-up: In 6 weeks for Port-A-Cath flush and in 3 months for repeat evaluation.  30 minutes were spent on this visit.  The time was dedicated to reviewing imaging studies, discussing treatment options and outlining future plan of care.  Zola Button, MD 4/28/20228:36 AM

## 2020-05-31 ENCOUNTER — Other Ambulatory Visit: Payer: Self-pay | Admitting: Oncology

## 2020-05-31 DIAGNOSIS — C61 Malignant neoplasm of prostate: Secondary | ICD-10-CM

## 2020-06-02 ENCOUNTER — Other Ambulatory Visit: Payer: Self-pay | Admitting: *Deleted

## 2020-06-02 DIAGNOSIS — C61 Malignant neoplasm of prostate: Secondary | ICD-10-CM

## 2020-06-02 MED ORDER — ABIRATERONE ACETATE 250 MG PO TABS: 1000.0000 mg | ORAL_TABLET | Freq: Every day | ORAL | 9 refills | Status: DC

## 2020-06-02 NOTE — Telephone Encounter (Signed)
Per patient message - Dennis Patel has not received a new prescription for Zytiga.  Contacted [facility] -  rep Estill Bamberg states pharmacy never received prescription for Zytiga on 05/29/20.  (In chart: E-Prescribing Status: Receipt confirmed by pharmacy (05/30/2019 9:33 AM EDT). Dr. Alen Blew informed. Prescription (re)sent to [facility] per Dr. Hazeline Junker instruction. Notified patient that new prescription sent today 06/02/20 - he verbalized understanding.

## 2020-06-25 ENCOUNTER — Other Ambulatory Visit: Payer: Self-pay

## 2020-06-25 ENCOUNTER — Inpatient Hospital Stay: Payer: Medicare Other | Attending: Oncology

## 2020-06-25 DIAGNOSIS — Z95828 Presence of other vascular implants and grafts: Secondary | ICD-10-CM

## 2020-06-25 DIAGNOSIS — C61 Malignant neoplasm of prostate: Secondary | ICD-10-CM | POA: Diagnosis not present

## 2020-06-25 LAB — CBC WITH DIFFERENTIAL (CANCER CENTER ONLY)
Abs Immature Granulocytes: 0.05 10*3/uL (ref 0.00–0.07)
Basophils Absolute: 0 10*3/uL (ref 0.0–0.1)
Basophils Relative: 0 %
Eosinophils Absolute: 0.1 10*3/uL (ref 0.0–0.5)
Eosinophils Relative: 1 %
HCT: 42.1 % (ref 39.0–52.0)
Hemoglobin: 15 g/dL (ref 13.0–17.0)
Immature Granulocytes: 0 %
Lymphocytes Relative: 10 %
Lymphs Abs: 1.2 10*3/uL (ref 0.7–4.0)
MCH: 31.9 pg (ref 26.0–34.0)
MCHC: 35.6 g/dL (ref 30.0–36.0)
MCV: 89.6 fL (ref 80.0–100.0)
Monocytes Absolute: 0.7 10*3/uL (ref 0.1–1.0)
Monocytes Relative: 6 %
Neutro Abs: 9.6 10*3/uL — ABNORMAL HIGH (ref 1.7–7.7)
Neutrophils Relative %: 83 %
Platelet Count: 198 10*3/uL (ref 150–400)
RBC: 4.7 MIL/uL (ref 4.22–5.81)
RDW: 12.3 % (ref 11.5–15.5)
WBC Count: 11.6 10*3/uL — ABNORMAL HIGH (ref 4.0–10.5)
nRBC: 0 % (ref 0.0–0.2)

## 2020-06-25 LAB — CMP (CANCER CENTER ONLY)
ALT: 24 U/L (ref 0–44)
AST: 16 U/L (ref 15–41)
Albumin: 4 g/dL (ref 3.5–5.0)
Alkaline Phosphatase: 84 U/L (ref 38–126)
Anion gap: 11 (ref 5–15)
BUN: 14 mg/dL (ref 8–23)
CO2: 25 mmol/L (ref 22–32)
Calcium: 9.5 mg/dL (ref 8.9–10.3)
Chloride: 107 mmol/L (ref 98–111)
Creatinine: 0.96 mg/dL (ref 0.61–1.24)
GFR, Estimated: >60 mL/min (ref >60)
Glucose, Bld: 136 mg/dL — ABNORMAL HIGH (ref 70–99)
Potassium: 4.2 mmol/L (ref 3.5–5.1)
Sodium: 143 mmol/L (ref 135–145)
Total Bilirubin: 1 mg/dL (ref 0.3–1.2)
Total Protein: 6.5 g/dL (ref 6.5–8.1)

## 2020-06-25 MED ORDER — HEPARIN SOD (PORK) LOCK FLUSH 100 UNIT/ML IV SOLN
500.0000 [IU] | Freq: Once | INTRAVENOUS | Status: AC | PRN
  Administered 2020-06-25: 500 [IU]
  Filled 2020-06-25: qty 5

## 2020-06-25 MED ORDER — SODIUM CHLORIDE 0.9% FLUSH
10.0000 mL | INTRAVENOUS | Status: DC | PRN
  Administered 2020-06-25: 10 mL
  Filled 2020-06-25: qty 10

## 2020-06-26 LAB — PROSTATE-SPECIFIC AG, SERUM (LABCORP): Prostate Specific Ag, Serum: 0.4 ng/mL (ref 0.0–4.0)

## 2020-07-14 ENCOUNTER — Other Ambulatory Visit: Payer: Self-pay | Admitting: Vascular Surgery

## 2020-07-16 NOTE — Telephone Encounter (Signed)
Please make appointment.

## 2020-08-04 ENCOUNTER — Other Ambulatory Visit: Payer: Self-pay | Admitting: Vascular Surgery

## 2020-08-06 ENCOUNTER — Other Ambulatory Visit: Payer: Self-pay | Admitting: *Deleted

## 2020-08-06 DIAGNOSIS — I739 Peripheral vascular disease, unspecified: Secondary | ICD-10-CM

## 2020-08-06 MED ORDER — ATORVASTATIN CALCIUM 10 MG PO TABS: 10.0000 mg | ORAL_TABLET | Freq: Every day | ORAL | 3 refills | Status: DC

## 2020-08-12 ENCOUNTER — Inpatient Hospital Stay: Payer: Medicare Other

## 2020-08-12 ENCOUNTER — Ambulatory Visit: Payer: Medicare Other | Admitting: Oncology

## 2020-08-12 ENCOUNTER — Other Ambulatory Visit: Payer: Medicare Other

## 2020-08-12 ENCOUNTER — Other Ambulatory Visit: Payer: Self-pay

## 2020-08-12 ENCOUNTER — Inpatient Hospital Stay: Payer: Medicare Other | Attending: Oncology

## 2020-08-12 VITALS — BP 151/69 | HR 64 | Temp 98.6°F | Resp 18

## 2020-08-12 DIAGNOSIS — Z95828 Presence of other vascular implants and grafts: Secondary | ICD-10-CM

## 2020-08-12 DIAGNOSIS — C61 Malignant neoplasm of prostate: Secondary | ICD-10-CM | POA: Diagnosis not present

## 2020-08-12 LAB — CBC WITH DIFFERENTIAL (CANCER CENTER ONLY)
Abs Immature Granulocytes: 0.04 10*3/uL (ref 0.00–0.07)
Basophils Absolute: 0 10*3/uL (ref 0.0–0.1)
Basophils Relative: 0 %
Eosinophils Absolute: 0.1 10*3/uL (ref 0.0–0.5)
Eosinophils Relative: 1 %
HCT: 42.8 % (ref 39.0–52.0)
Hemoglobin: 15.1 g/dL (ref 13.0–17.0)
Immature Granulocytes: 0 %
Lymphocytes Relative: 15 %
Lymphs Abs: 1.6 10*3/uL (ref 0.7–4.0)
MCH: 31.6 pg (ref 26.0–34.0)
MCHC: 35.3 g/dL (ref 30.0–36.0)
MCV: 89.5 fL (ref 80.0–100.0)
Monocytes Absolute: 0.7 10*3/uL (ref 0.1–1.0)
Monocytes Relative: 6 %
Neutro Abs: 8.4 10*3/uL — ABNORMAL HIGH (ref 1.7–7.7)
Neutrophils Relative %: 78 %
Platelet Count: 195 10*3/uL (ref 150–400)
RBC: 4.78 MIL/uL (ref 4.22–5.81)
RDW: 11.9 % (ref 11.5–15.5)
WBC Count: 10.8 10*3/uL — ABNORMAL HIGH (ref 4.0–10.5)
nRBC: 0 % (ref 0.0–0.2)

## 2020-08-12 LAB — CMP (CANCER CENTER ONLY)
ALT: 21 U/L (ref 0–44)
AST: 18 U/L (ref 15–41)
Albumin: 4.1 g/dL (ref 3.5–5.0)
Alkaline Phosphatase: 80 U/L (ref 38–126)
Anion gap: 10 (ref 5–15)
BUN: 13 mg/dL (ref 8–23)
CO2: 26 mmol/L (ref 22–32)
Calcium: 9.9 mg/dL (ref 8.9–10.3)
Chloride: 105 mmol/L (ref 98–111)
Creatinine: 0.93 mg/dL (ref 0.61–1.24)
GFR, Estimated: >60 mL/min (ref >60)
Glucose, Bld: 127 mg/dL — ABNORMAL HIGH (ref 70–99)
Potassium: 3.9 mmol/L (ref 3.5–5.1)
Sodium: 141 mmol/L (ref 135–145)
Total Bilirubin: 1 mg/dL (ref 0.3–1.2)
Total Protein: 6.7 g/dL (ref 6.5–8.1)

## 2020-08-12 MED ORDER — SODIUM CHLORIDE 0.9% FLUSH
10.0000 mL | INTRAVENOUS | Status: DC | PRN
  Administered 2020-08-12: 10 mL
  Filled 2020-08-12: qty 10

## 2020-08-12 MED ORDER — LEUPROLIDE ACETATE (6 MONTH) 45 MG ~~LOC~~ KIT
45.0000 mg | PACK | Freq: Once | SUBCUTANEOUS | Status: AC
  Administered 2020-08-12: 45 mg via SUBCUTANEOUS
  Filled 2020-08-12: qty 45

## 2020-08-12 MED ORDER — HEPARIN SOD (PORK) LOCK FLUSH 100 UNIT/ML IV SOLN
500.0000 [IU] | Freq: Once | INTRAVENOUS | Status: AC | PRN
  Administered 2020-08-12: 500 [IU]
  Filled 2020-08-12: qty 5

## 2020-08-12 NOTE — Patient Instructions (Signed)
Leuprolide depot injection What is this medication? LEUPROLIDE (loo PROE lide) is a man-made protein that acts like a natural hormone in the body. It decreases testosterone in men and decreases estrogen in women. In men, this medicine is used to treat advanced prostate cancer. In women, some forms of this medicine may be used to treat endometriosis, uterinefibroids, or other male hormone-related problems. This medicine may be used for other purposes; ask your health care provider orpharmacist if you have questions. COMMON BRAND NAME(S): Eligard, Fensolv, Lupron Depot, Lupron Depot-Ped, Viadur What should I tell my care team before I take this medication? They need to know if you have any of these conditions: diabetes heart disease or previous heart attack high blood pressure high cholesterol mental illness osteoporosis pain or difficulty passing urine seizures spinal cord metastasis stroke suicidal thoughts, plans, or attempt; a previous suicide attempt by you or a family member tobacco smoker unusual vaginal bleeding (women) an unusual or allergic reaction to leuprolide, benzyl alcohol, other medicines, foods, dyes, or preservatives pregnant or trying to get pregnant breast-feeding How should I use this medication? This medicine is for injection into a muscle or for injection under the skin. It is given by a health care professional in a hospital or clinic setting. The specific product will determine how it will be given to you. Make sure youunderstand which product you receive and how often you will receive it. Talk to your pediatrician regarding the use of this medicine in children.Special care may be needed. Overdosage: If you think you have taken too much of this medicine contact apoison control center or emergency room at once. NOTE: This medicine is only for you. Do not share this medicine with others. What if I miss a dose? It is important not to miss a dose. Call your doctor  or health careprofessional if you are unable to keep an appointment. Depot injections: Depot injections are given either once-monthly, every 12 weeks, every 16 weeks, or every 24 weeks depending on the product you are prescribed. The product you are prescribed will be based on if you are male orfemale, and your condition. Make sure you understand your product and dosing. What may interact with this medication? Do not take this medicine with any of the following medications: chasteberry cisapride dronedarone pimozide thioridazine This medicine may also interact with the following medications: herbal or dietary supplements, like black cohosh or DHEA male hormones, like estrogens or progestins and birth control pills, patches, rings, or injections male hormones, like testosterone other medicines that prolong the QT interval (abnormal heart rhythm) This list may not describe all possible interactions. Give your health care provider a list of all the medicines, herbs, non-prescription drugs, or dietary supplements you use. Also tell them if you smoke, drink alcohol, or use illegaldrugs. Some items may interact with your medicine. What should I watch for while using this medication? Visit your doctor or health care professional for regular checks on your progress. During the first weeks of treatment, your symptoms may get worse, but then will improve as you continue your treatment. You may get hot flashes, increased bone pain, increased difficulty passing urine, or an aggravation of nerve symptoms. Discuss these effects with your doctor or health careprofessional, some of them may improve with continued use of this medicine. Male patients may experience a menstrual cycle or spotting during the first months of therapy with this medicine. If this continues, contact your doctor orhealth care professional. This medicine may increase blood sugar. Ask  your healthcare provider if changesin diet or medicines  are needed if you have diabetes. What side effects may I notice from receiving this medication? Side effects that you should report to your doctor or health care professionalas soon as possible: allergic reactions like skin rash, itching or hives, swelling of the face, lips, or tongue breathing problems chest pain depression or memory disorders pain in your legs or groin pain at site where injected or implanted seizures severe headache signs and symptoms of high blood sugar such as being more thirsty or hungry or having to urinate more than normal. You may also feel very tired or have blurry vision swelling of the feet and legs suicidal thoughts or other mood changes visual changes vomiting Side effects that usually do not require medical attention (report to yourdoctor or health care professional if they continue or are bothersome): breast swelling or tenderness decrease in sex drive or performance diarrhea hot flashes loss of appetite muscle, joint, or bone pains nausea redness or irritation at site where injected or implanted skin problems or acne This list may not describe all possible side effects. Call your doctor for medical advice about side effects. You may report side effects to FDA at1-800-FDA-1088. Where should I keep my medication? This drug is given in a hospital or clinic and will not be stored at home. NOTE: This sheet is a summary. It may not cover all possible information. If you have questions about this medicine, talk to your doctor, pharmacist, orhealth care provider.  2022 Elsevier/Gold Standard (2018-12-05 10:35:13)

## 2020-08-13 LAB — PROSTATE-SPECIFIC AG, SERUM (LABCORP): Prostate Specific Ag, Serum: 0.4 ng/mL (ref 0.0–4.0)

## 2020-08-14 ENCOUNTER — Ambulatory Visit: Payer: Medicare Other

## 2020-08-17 ENCOUNTER — Other Ambulatory Visit: Payer: Self-pay

## 2020-08-17 DIAGNOSIS — I739 Peripheral vascular disease, unspecified: Secondary | ICD-10-CM

## 2020-08-25 ENCOUNTER — Other Ambulatory Visit: Payer: Self-pay

## 2020-08-25 ENCOUNTER — Inpatient Hospital Stay: Payer: Medicare Other | Attending: Oncology | Admitting: Oncology

## 2020-08-25 VITALS — BP 133/62 | HR 69 | Temp 98.8°F | Resp 18 | Ht 70.0 in | Wt 201.3 lb

## 2020-08-25 DIAGNOSIS — Z95828 Presence of other vascular implants and grafts: Secondary | ICD-10-CM | POA: Diagnosis not present

## 2020-08-25 DIAGNOSIS — C61 Malignant neoplasm of prostate: Secondary | ICD-10-CM | POA: Insufficient documentation

## 2020-08-25 DIAGNOSIS — G629 Polyneuropathy, unspecified: Secondary | ICD-10-CM | POA: Insufficient documentation

## 2020-08-25 DIAGNOSIS — Z192 Hormone resistant malignancy status: Secondary | ICD-10-CM | POA: Diagnosis not present

## 2020-08-25 DIAGNOSIS — Z7982 Long term (current) use of aspirin: Secondary | ICD-10-CM | POA: Diagnosis not present

## 2020-08-25 DIAGNOSIS — Z79899 Other long term (current) drug therapy: Secondary | ICD-10-CM | POA: Diagnosis not present

## 2020-08-25 DIAGNOSIS — E876 Hypokalemia: Secondary | ICD-10-CM | POA: Insufficient documentation

## 2020-08-25 MED ORDER — PREDNISONE 5 MG PO TABS: 5.0000 mg | ORAL_TABLET | Freq: Every day | ORAL | 3 refills | Status: DC

## 2020-08-25 NOTE — Progress Notes (Signed)
Hematology and Oncology Follow Up Visit  [name] 07-Jan-1951 70 y.o. 08/25/2020 1:13 PM [name], [name], [name], [name], MD   Principle Diagnosis: 70 year old man with prostate cancer diagnosed in 2019.  He presented with advanced bone disease, Gleason score 4+4 = 8 and a PSA of 48 and currently has castration-resistant disease.  Prior Therapy:  Prostate biopsy obtained on December 05, 2017 showed a Gleason score of 4+4 = 8.  Taxotere 75 mg/m every 3 weeks started on January 31, 2018.  He completed 5 cycles of therapy in April 2020.  Current therapy:   Lupron 45 mg every 6 months starting in July 2020.  Last injection given in August 12, 2020.  Zytiga 1000 mg daily with prednisone 5 mg daily started in May 2020.   Interim History: Mr. [name] is here for a follow-up visit.  Since last visit, he reports feeling well without any major complaints.  He denies any nausea, vomiting or abdominal pain.  He denies any complications related to [facility] including increased fatigue, edema or dizziness.  He does report peripheral neuropathy which is manageable with Neurontin which he takes twice a day.  Neuropathy is not interfering with his function at this time.                      Medications: Updated on review. Current Outpatient Medications  Medication Sig Dispense Refill   abiraterone acetate (ZYTIGA) 250 MG tablet Take 4 tablets (1,000 mg total) by mouth daily. Take on an empty stomach 1 hour before or 2 hours after a meal 120 tablet 9   acetaminophen (TYLENOL) 325 MG tablet Take 650 mg by mouth every 6 (six) hours as needed.     amLODipine (NORVASC) 10 MG tablet Take 1 tablet (10 mg total) by mouth daily. 30 tablet 0   aspirin EC 81 MG tablet Take 81 mg by mouth daily.     atorvastatin (LIPITOR) 10 MG tablet Take 1 tablet (10 mg total) by mouth daily. 90 tablet 3   calcium-vitamin D (OSCAL WITH D) 250-125 MG-UNIT tablet Take 1 tablet by mouth daily.      clopidogrel (PLAVIX) 75 MG tablet TAKE 1 TABLET BY MOUTH EVERY DAY 90 tablet 3   famotidine (PEPCID) 10 MG tablet Take 10 mg by mouth daily.     furosemide (LASIX) 20 MG tablet TAKE 2 TABLETS BY MOUTH EVERY DAY 60 tablet 0   gabapentin (NEURONTIN) 300 MG capsule TAKE 1 CAPSULE BY MOUTH THREE TIMES A DAY 90 capsule 3   hydrochlorothiazide (HYDRODIURIL) 12.5 MG tablet Take 12.5 mg by mouth every morning.     lidocaine-prilocaine (EMLA) cream Apply 1 application topically as needed. 30 g 0   Multiple Vitamins-Minerals (ONE DAILY MULTIVITAMIN MEN PO) Take 1 tablet by mouth daily.     predniSONE (DELTASONE) 5 MG tablet TAKE 1 TABLET BY MOUTH EVERY DAY WITH BREAKFAST 90 tablet 3   promethazine (PHENERGAN) 25 MG tablet Take 1 tablet (25 mg total) by mouth every 6 (six) hours as needed for nausea or vomiting. 30 tablet 3   tamsulosin (FLOMAX) 0.4 MG CAPS capsule      traZODone (DESYREL) 50 MG tablet TAKE 1 TABLET (50 MG TOTAL) BY MOUTH AT BEDTIME AS NEEDED FOR SLEEP. 90 tablet 1   No current facility-administered medications for this visit.     Allergies: No Known Allergies    Physical Exam:         Blood pressure 133/62, pulse 69,  temperature 98.8 F (37.1 C), temperature source Oral, resp. rate 18, height '5\' 10"'$  (1.778 m), weight 201 lb 4.8 oz (91.3 kg), SpO2 100 %.      ECOG: 0   General appearance: Comfortable appearing without any discomfort Head: Normocephalic without any trauma Oropharynx: Mucous membranes are moist and pink without any thrush or ulcers. Eyes: Pupils are equal and round reactive to light. Lymph nodes: No cervical, supraclavicular, inguinal or axillary lymphadenopathy.   Heart:regular rate and rhythm.  S1 and S2 without leg edema. Lung: Clear without any rhonchi or wheezes.  No dullness to percussion. Abdomin: Soft, nontender, nondistended with good bowel sounds.  No hepatosplenomegaly. Musculoskeletal: No joint deformity or effusion.  Full range of  motion noted. Neurological: No deficits noted on motor, sensory and deep tendon reflex exam. Skin: No petechial rash or dryness.  Appeared moist.                    Lab Results: Lab Results  Component Value Date   WBC 10.8 (H) 08/12/2020   HGB 15.1 08/12/2020   HCT 42.8 08/12/2020   MCV 89.5 08/12/2020   PLT 195 08/12/2020     Chemistry      Component Value Date/Time   NA 141 08/12/2020 1345   K 3.9 08/12/2020 1345   CL 105 08/12/2020 1345   CO2 26 08/12/2020 1345   BUN 13 08/12/2020 1345   CREATININE 0.93 08/12/2020 1345      Component Value Date/Time   CALCIUM 9.9 08/12/2020 1345   ALKPHOS 80 08/12/2020 1345   AST 18 08/12/2020 1345   ALT 21 08/12/2020 1345   BILITOT 1.0 08/12/2020 1345       Results for Dennis Patel, MOSSEY (MRN [id number]) as of 08/25/2020 13:12  Ref. Range 06/25/2020 11:05 08/12/2020 13:45  Prostate Specific Ag, Serum Latest Ref Range: 0.0 - 4.0 ng/mL 0.4 0.4      Impression and Plan:   70 year old man with:   1.  Castration-resistant advanced prostate cancer with disease to the bone diagnosed in 2019.    He is currently on Zytiga with overall reasonable disease response and PSA control.  Laboratory data on 08/12/2020 were personally reviewed and continues to show a PSA of 0.4 which is unchanged.  Risks and benefits of continuing Zytiga were discussed.  Different salvage therapy options were also reviewed.  These including Jevtana chemotherapy, lutetium   2.  Androgen deprivation therapy: Long-term complication occluding weight gain, hot flashes among others were reviewed.  Next Eligard will be in January 2023 and every 6 months.   3.  Bone directed therapy: I recommended calcium and vitamin D supplements for the time being.  Delton See option has been deferred.   4.  Neuropathic pain: Manageable and has decreased his Neurontin dose to twice a day.   5.  IV access: Port-A-Cath remains in place and will be flushed periodically.  6.   Prognosis and goals of care: His disease is incurable although aggressive measures are warranted given his reasonable performance status.  7.  Hypokalemia: His potassium is within normal range and no additional replacement is needed.  This will be continued to be monitored on Zytiga.    8.  Follow-up: Every 6 weeks for Port-A-Cath flush and MD follow-up in 3 months.  30 minutes were dedicated to this visit.  Time spent on reviewing laboratory data, disease status update, management choices for the future and answering questions regarding complications related to cancer and cancer therapy.  Zola Button, MD 8/9/20221:13 PM

## 2020-08-26 ENCOUNTER — Encounter (HOSPITAL_COMMUNITY): Payer: Medicare Other

## 2020-08-27 ENCOUNTER — Ambulatory Visit (HOSPITAL_COMMUNITY)
Admission: RE | Admit: 2020-08-27 | Discharge: 2020-08-27 | Disposition: A | Discharge status: Home / Self Care | Payer: Medicare Other | Source: Ambulatory Visit | Attending: Vascular Surgery | Admitting: Vascular Surgery

## 2020-08-27 ENCOUNTER — Ambulatory Visit (INDEPENDENT_AMBULATORY_CARE_PROVIDER_SITE_OTHER)
Admission: RE | Admit: 2020-08-27 | Discharge: 2020-08-27 | Disposition: A | Discharge status: Home / Self Care | Payer: Medicare Other | Source: Ambulatory Visit | Attending: Vascular Surgery | Admitting: Vascular Surgery

## 2020-08-27 ENCOUNTER — Other Ambulatory Visit: Payer: Self-pay

## 2020-08-27 DIAGNOSIS — I739 Peripheral vascular disease, unspecified: Secondary | ICD-10-CM

## 2020-09-02 ENCOUNTER — Other Ambulatory Visit: Payer: Self-pay

## 2020-09-02 ENCOUNTER — Ambulatory Visit (INDEPENDENT_AMBULATORY_CARE_PROVIDER_SITE_OTHER): Payer: Medicare Other | Admitting: Vascular Surgery

## 2020-09-02 ENCOUNTER — Encounter: Payer: Self-pay | Admitting: Vascular Surgery

## 2020-09-02 VITALS — BP 153/78 | HR 61 | Temp 98.2°F | Resp 20 | Ht 70.0 in | Wt 197.0 lb

## 2020-09-02 DIAGNOSIS — I739 Peripheral vascular disease, unspecified: Secondary | ICD-10-CM | POA: Diagnosis not present

## 2020-09-02 NOTE — Progress Notes (Signed)
REASON FOR VISIT:   Follow-up of peripheral vascular disease  MEDICAL ISSUES:   PERIPHERAL VASCULAR DISEASE: The patient has stable claudication and can walk 100 yards before experiencing symptoms.  He is not a smoker.  He is on Plavix and a statin.  His stent is open with some mildly elevated velocities suggesting a greater than 50% stenosis in his left common iliac artery stent.  He also has some disease in his right external iliac artery.  As his symptoms are stable I would not recommend an aggressive work-up for this unless his symptoms progressed significantly.  I have ordered follow-up ABIs and an aortoiliac duplex in 1 year and I will see him back at that time.  We again discussed the importance of exercise and nutrition.   HPI:   Dennis Patel is a pleasant 70 y.o. male who underwent angioplasty and stenting of a left common iliac artery stenosis on 09/08/2017.  This was a VBX stent.  I last saw the patient on 04/11/2018.  At that time he was doing well.  His left common iliac artery stent was widely patent.  He was on aspirin Plavix and a statin.  He did have a stenosis in the common femoral artery on the right which was contributing to his right lower extremity claudication.  Since I saw him last he does admit to some right leg claudication symptoms.  The symptoms are brought on by ambulation and relieved with rest.  He can walk 100 yards before experiencing symptoms.  His symptoms have been stable.  He denies any history of rest pain.  He denies any history of nonhealing wounds.  He has a history of prostate cancer and was undergoing chemotherapy because of metastatic disease to the bone.  He is finished his chemotherapy.  His prognosis is improved and has been told that his life expectancy is now 7 years instead of 3 years.  This is obviously excellent news.  He does not smoke cigarettes but does smoke an occasional cigar.  He quit cigarettes in 2005.  He is on Plavix and a statin.   His aspirin was stopped by his primary care physician.  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 Take on an empty stomach 1 hour before or 2 hours after a meal 120 tablet 9   acetaminophen (TYLENOL) 325 MG tablet Take 650 mg by mouth every 6 (six) hours as needed.     amLODipine (NORVASC) 10 MG tablet Take 1 tablet (10 mg total) by mouth daily. 30 tablet 0   atorvastatin (LIPITOR) 10 MG tablet Take 1 tablet (10 mg total) by mouth daily. 90 tablet 3   calcium-vitamin D (OSCAL WITH D) 250-125 MG-UNIT tablet Take 1 tablet by mouth daily.     clopidogrel (PLAVIX) 75 MG tablet TAKE 1 TABLET BY MOUTH EVERY DAY 90 tablet 3   furosemide (LASIX) 20 MG tablet TAKE 2 TABLETS BY MOUTH EVERY DAY 60 tablet 0   gabapentin (NEURONTIN) 300 MG capsule TAKE 1 CAPSULE BY MOUTH THREE TIMES A DAY 90 capsule 3   hydrochlorothiazide (HYDRODIURIL) 12.5 MG tablet Take 12.5 mg by mouth every  morning.     Multiple Vitamins-Minerals (ONE DAILY MULTIVITAMIN MEN PO) Take 1 tablet by mouth daily.     predniSONE (DELTASONE) 5 MG tablet Take 1 tablet (5 mg total) by mouth daily with breakfast. 90 tablet 3   aspirin EC 81 MG tablet Take 81 mg by mouth daily. (Patient not taking: Reported on 09/02/2020)     tamsulosin (FLOMAX) 0.4 MG CAPS capsule  (Patient not taking: Reported on 09/02/2020)     No current facility-administered medications for this visit.    REVIEW OF SYSTEMS:  '[X]'$  denotes positive finding, '[ ]'$  denotes negative finding Cardiac  Comments:  Chest pain or chest pressure:    Shortness of breath upon exertion:    Short of breath when lying flat:    Irregular heart rhythm:        Vascular    Pain in calf, thigh, or hip brought on by ambulation: x   Pain in feet at night that wakes you up from your sleep:     Blood clot in your veins:    Leg swelling:         Pulmonary    Oxygen at home:    Productive cough:     Wheezing:         Neurologic    Sudden weakness in arms or legs:     Sudden numbness in arms or legs:     Sudden onset of difficulty speaking or slurred speech:    Temporary loss of vision in one eye:     Problems with dizziness:         Gastrointestinal    Blood in stool:     Vomited blood:         Genitourinary    Burning when urinating:     Blood in urine:        Psychiatric    Major depression:         Hematologic    Bleeding problems:    Problems with blood clotting too easily:        Skin    Rashes or ulcers:        Constitutional    Fever or chills:     PHYSICAL EXAM:   Vitals:   09/02/20 0814  BP: (!) 153/78  Pulse: 61  Resp: 20  Temp: 98.2 F (36.8 C)  SpO2: 96%  Weight: 197 lb (89.4 kg)  Height: '5\' 10"'$  (1.778 m)    GENERAL: The patient is a well-nourished male, in no acute distress. The  vital signs are documented above. CARDIAC: There is a regular rate and rhythm.  VASCULAR: I do not detect carotid bruits. His  femoral pulses difficult to palpate. I cannot palpate pedal pulses.  Both feet are warm and well-perfused.  He has no significant lower extremity swelling. PULMONARY: There is good air exchange bilaterally without wheezing or rales. ABDOMEN: Soft and non-tender with normal pitched bowel sounds.  MUSCULOSKELETAL: There are no major deformities or cyanosis. NEUROLOGIC: No focal weakness or paresthesias are detected. SKIN: There are no ulcers or rashes noted. PSYCHIATRIC: The patient has a normal affect.  DATA:    AORTOILIAC DUPLEX: I reviewed his aortoiliac duplex that was done on 1122.  This showed some elevated velocities in the distal stent in the left common iliac artery suggesting a greater than 50% stenosis.  Peak systolic velocity was 123456 cm/s.  There was also a greater than 50% stenosis in the right external iliac artery.  ARTERIAL DOPPLER STUDY: I reviewed the Doppler study from 08/27/2020.  On the right side there was a monophasic dorsalis pedis and posterior tibial signal.  ABI was 48%.  Toe pressure was 46 mmHg.  On the left side there was a biphasic posterior tibial signal with a monophasic dorsalis pedis signal.  ABI was 69%.  Toe pressure was 74 mmHg.  Deitra Mayo [facility] [phone]

## 2020-09-04 ENCOUNTER — Telehealth: Payer: Self-pay | Admitting: Oncology

## 2020-09-04 NOTE — Telephone Encounter (Signed)
Scheduled per 08/09 los, patient has been called and notified. 

## 2020-10-01 DIAGNOSIS — Z85820 Personal history of malignant melanoma of skin: Secondary | ICD-10-CM | POA: Diagnosis not present

## 2020-10-01 DIAGNOSIS — L821 Other seborrheic keratosis: Secondary | ICD-10-CM | POA: Diagnosis not present

## 2020-10-01 DIAGNOSIS — L570 Actinic keratosis: Secondary | ICD-10-CM | POA: Diagnosis not present

## 2020-10-01 DIAGNOSIS — Z85828 Personal history of other malignant neoplasm of skin: Secondary | ICD-10-CM | POA: Diagnosis not present

## 2020-10-07 ENCOUNTER — Inpatient Hospital Stay: Payer: Medicare Other | Attending: Oncology

## 2020-10-07 ENCOUNTER — Other Ambulatory Visit: Payer: Self-pay

## 2020-10-07 DIAGNOSIS — Z452 Encounter for adjustment and management of vascular access device: Secondary | ICD-10-CM | POA: Diagnosis not present

## 2020-10-07 DIAGNOSIS — C61 Malignant neoplasm of prostate: Secondary | ICD-10-CM | POA: Insufficient documentation

## 2020-10-07 DIAGNOSIS — Z95828 Presence of other vascular implants and grafts: Secondary | ICD-10-CM

## 2020-10-07 MED ORDER — SODIUM CHLORIDE 0.9% FLUSH
10.0000 mL | INTRAVENOUS | Status: DC | PRN
  Administered 2020-10-07: 10 mL

## 2020-10-07 MED ORDER — HEPARIN SOD (PORK) LOCK FLUSH 100 UNIT/ML IV SOLN
500.0000 [IU] | Freq: Once | INTRAVENOUS | Status: AC | PRN
  Administered 2020-10-07: 500 [IU]

## 2020-10-10 ENCOUNTER — Other Ambulatory Visit: Payer: Self-pay | Admitting: Oncology

## 2020-10-12 ENCOUNTER — Encounter: Payer: Self-pay | Admitting: Oncology

## 2020-10-15 ENCOUNTER — Encounter: Payer: Self-pay | Admitting: Oncology

## 2020-10-31 ENCOUNTER — Other Ambulatory Visit: Payer: Self-pay | Admitting: Oncology

## 2020-11-02 ENCOUNTER — Encounter: Payer: Self-pay | Admitting: Oncology

## 2020-11-04 ENCOUNTER — Telehealth: Payer: Self-pay

## 2020-11-04 ENCOUNTER — Encounter: Payer: Self-pay | Admitting: Oncology

## 2020-11-04 ENCOUNTER — Other Ambulatory Visit (HOSPITAL_COMMUNITY): Payer: Self-pay

## 2020-11-04 NOTE — Telephone Encounter (Signed)
Oral Oncology Patient Advocate Encounter  Was successful in securing patient a $8000 grant from [employer] to provide copayment coverage for Zytiga.  This will keep the out of pocket expense at $0.     [employer] ID: [id number]  I have spoken with the patient.   The billing information is as follows and has been shared with [place]: [id number] PCN: [id number] Member ID: [id number] Group ID: [id number] Dates of Eligibility: 10/05/20 through 10/04/21  Fund:  [place] Patient [place] Phone [phone] Fax [phone] 11/04/2020 1:13 PM

## 2020-11-25 ENCOUNTER — Inpatient Hospital Stay: Payer: Medicare Other

## 2020-11-25 ENCOUNTER — Other Ambulatory Visit: Payer: Self-pay

## 2020-11-25 ENCOUNTER — Inpatient Hospital Stay: Payer: Medicare Other | Attending: Oncology

## 2020-11-25 DIAGNOSIS — Z7982 Long term (current) use of aspirin: Secondary | ICD-10-CM | POA: Insufficient documentation

## 2020-11-25 DIAGNOSIS — C7951 Secondary malignant neoplasm of bone: Secondary | ICD-10-CM | POA: Insufficient documentation

## 2020-11-25 DIAGNOSIS — E876 Hypokalemia: Secondary | ICD-10-CM | POA: Insufficient documentation

## 2020-11-25 DIAGNOSIS — C61 Malignant neoplasm of prostate: Secondary | ICD-10-CM | POA: Diagnosis not present

## 2020-11-25 DIAGNOSIS — Z79899 Other long term (current) drug therapy: Secondary | ICD-10-CM | POA: Insufficient documentation

## 2020-11-25 DIAGNOSIS — Z192 Hormone resistant malignancy status: Secondary | ICD-10-CM | POA: Diagnosis not present

## 2020-11-25 DIAGNOSIS — Z95828 Presence of other vascular implants and grafts: Secondary | ICD-10-CM

## 2020-11-25 DIAGNOSIS — M792 Neuralgia and neuritis, unspecified: Secondary | ICD-10-CM | POA: Diagnosis not present

## 2020-11-25 LAB — CBC WITH DIFFERENTIAL (CANCER CENTER ONLY)
Abs Immature Granulocytes: 0.05 10*3/uL (ref 0.00–0.07)
Basophils Absolute: 0 10*3/uL (ref 0.0–0.1)
Basophils Relative: 0 %
Eosinophils Absolute: 0.1 10*3/uL (ref 0.0–0.5)
Eosinophils Relative: 1 %
HCT: 41.8 % (ref 39.0–52.0)
Hemoglobin: 15.1 g/dL (ref 13.0–17.0)
Immature Granulocytes: 0 %
Lymphocytes Relative: 14 %
Lymphs Abs: 1.7 10*3/uL (ref 0.7–4.0)
MCH: 31.9 pg (ref 26.0–34.0)
MCHC: 36.1 g/dL — ABNORMAL HIGH (ref 30.0–36.0)
MCV: 88.2 fL (ref 80.0–100.0)
Monocytes Absolute: 0.7 10*3/uL (ref 0.1–1.0)
Monocytes Relative: 5 %
Neutro Abs: 9.9 10*3/uL — ABNORMAL HIGH (ref 1.7–7.7)
Neutrophils Relative %: 80 %
Platelet Count: 210 10*3/uL (ref 150–400)
RBC: 4.74 MIL/uL (ref 4.22–5.81)
RDW: 11.9 % (ref 11.5–15.5)
WBC Count: 12.4 10*3/uL — ABNORMAL HIGH (ref 4.0–10.5)
nRBC: 0 % (ref 0.0–0.2)

## 2020-11-25 LAB — CMP (CANCER CENTER ONLY)
ALT: 38 U/L (ref 0–44)
AST: 21 U/L (ref 15–41)
Albumin: 4.3 g/dL (ref 3.5–5.0)
Alkaline Phosphatase: 85 U/L (ref 38–126)
Anion gap: 10 (ref 5–15)
BUN: 15 mg/dL (ref 8–23)
CO2: 25 mmol/L (ref 22–32)
Calcium: 9.4 mg/dL (ref 8.9–10.3)
Chloride: 106 mmol/L (ref 98–111)
Creatinine: 0.9 mg/dL (ref 0.61–1.24)
GFR, Estimated: >60 mL/min (ref >60)
Glucose, Bld: 144 mg/dL — ABNORMAL HIGH (ref 70–99)
Potassium: 4.1 mmol/L (ref 3.5–5.1)
Sodium: 141 mmol/L (ref 135–145)
Total Bilirubin: 0.7 mg/dL (ref 0.3–1.2)
Total Protein: 6.8 g/dL (ref 6.5–8.1)

## 2020-11-25 MED ORDER — HEPARIN SOD (PORK) LOCK FLUSH 100 UNIT/ML IV SOLN
500.0000 [IU] | Freq: Once | INTRAVENOUS | Status: AC | PRN
  Administered 2020-11-25: 500 [IU]

## 2020-11-25 MED ORDER — SODIUM CHLORIDE 0.9% FLUSH
10.0000 mL | INTRAVENOUS | Status: DC | PRN
  Administered 2020-11-25: 10 mL

## 2020-11-26 LAB — PROSTATE-SPECIFIC AG, SERUM (LABCORP): Prostate Specific Ag, Serum: 0.2 ng/mL (ref 0.0–4.0)

## 2020-12-02 ENCOUNTER — Other Ambulatory Visit: Payer: Self-pay

## 2020-12-02 ENCOUNTER — Inpatient Hospital Stay: Payer: Medicare Other | Admitting: Oncology

## 2020-12-02 VITALS — BP 126/62 | HR 65 | Temp 97.9°F | Resp 17 | Ht 70.0 in | Wt 203.2 lb

## 2020-12-02 DIAGNOSIS — Z7982 Long term (current) use of aspirin: Secondary | ICD-10-CM | POA: Diagnosis not present

## 2020-12-02 DIAGNOSIS — E876 Hypokalemia: Secondary | ICD-10-CM | POA: Diagnosis not present

## 2020-12-02 DIAGNOSIS — C61 Malignant neoplasm of prostate: Secondary | ICD-10-CM | POA: Diagnosis not present

## 2020-12-02 DIAGNOSIS — M792 Neuralgia and neuritis, unspecified: Secondary | ICD-10-CM | POA: Diagnosis not present

## 2020-12-02 DIAGNOSIS — C7951 Secondary malignant neoplasm of bone: Secondary | ICD-10-CM | POA: Diagnosis not present

## 2020-12-02 DIAGNOSIS — Z79899 Other long term (current) drug therapy: Secondary | ICD-10-CM | POA: Diagnosis not present

## 2020-12-02 DIAGNOSIS — Z192 Hormone resistant malignancy status: Secondary | ICD-10-CM | POA: Diagnosis not present

## 2020-12-02 NOTE — Progress Notes (Signed)
Hematology and Oncology Follow Up Visit  Dennis Patel [id number] 03/08/1950 70 y.o. 12/02/2020 9:08 AM Dennis Patel, [name], [name], MD   Principle Diagnosis: 70 year old man with castration-resistant advanced prostate cancer with disease to the bone diagnosed in 2019.  He was found to have Gleason score 4+4 = 8 and a PSA of 48 at the time of diagnosis.  Prior Therapy:  Prostate biopsy obtained on December 05, 2017 showed a Gleason score of 4+4 = 8.  Taxotere 75 mg/m every 3 weeks started on January 31, 2018.  He completed 5 cycles of therapy in April 2020.  Current therapy:   Lupron 45 mg every 6 months starting in July 2020.  Last injection given in August 12, 2020.  Zytiga 1000 mg daily with prednisone 5 mg daily started in May 2020.   Interim History: Dennis Patel is here for return follow-up.  Since the last visit, he reports no major changes in his health.  He continues to tolerate Zytiga without any recent complaints.  He denies any nausea, vomiting or abdominal pain.  He denies any worsening fatigue or tiredness.  He continues to have sensory neuropathy in his lower extremities and not interfering with function.  He remains active and able to drive without any decline in ability to do so.                      Medications: Reviewed without changes. Current Outpatient Medications  Medication Sig Dispense Refill   abiraterone acetate (ZYTIGA) 250 MG tablet Take 4 tablets (1,000 mg total) by mouth daily. Take on an empty stomach 1 hour before or 2 hours after a meal 120 tablet 9   acetaminophen (TYLENOL) 325 MG tablet Take 650 mg by mouth every 6 (six) hours as needed.     amLODipine (NORVASC) 10 MG tablet Take 1 tablet (10 mg total) by mouth daily. 30 tablet 0   aspirin EC 81 MG tablet Take 81 mg by mouth daily. (Patient not taking: Reported on 09/02/2020)     atorvastatin (LIPITOR) 10 MG tablet Take 1 tablet (10 mg total) by mouth daily. 90 tablet 3    calcium-vitamin D (OSCAL WITH D) 250-125 MG-UNIT tablet Take 1 tablet by mouth daily.     clopidogrel (PLAVIX) 75 MG tablet TAKE 1 TABLET BY MOUTH EVERY DAY 90 tablet 3   furosemide (LASIX) 20 MG tablet TAKE 2 TABLETS BY MOUTH EVERY DAY 60 tablet 0   gabapentin (NEURONTIN) 300 MG capsule TAKE 1 CAPSULE BY MOUTH THREE TIMES A DAY 90 capsule 3   hydrochlorothiazide (HYDRODIURIL) 12.5 MG tablet Take 12.5 mg by mouth every morning.     Multiple Vitamins-Minerals (ONE DAILY MULTIVITAMIN MEN PO) Take 1 tablet by mouth daily.     predniSONE (DELTASONE) 5 MG tablet Take 1 tablet (5 mg total) by mouth daily with breakfast. 90 tablet 3   tamsulosin (FLOMAX) 0.4 MG CAPS capsule  (Patient not taking: Reported on 09/02/2020)     traZODone (DESYREL) 50 MG tablet TAKE 1 TABLET BY MOUTH AT BEDTIME AS NEEDED FOR SLEEP. 90 tablet 1   No current facility-administered medications for this visit.     Allergies: No Known Allergies    Physical Exam:          Blood pressure 126/62, pulse 65, temperature 97.9 F (36.6 C), temperature source Temporal, resp. rate 17, height 5\' 10"  (1.778 m), weight 203 lb 3.2 oz (92.2 kg), SpO2 100 %.  ECOG: 0    General appearance: Alert, awake without any distress. Head: Atraumatic without abnormalities Oropharynx: Without any thrush or ulcers. Eyes: No scleral icterus. Lymph nodes: No lymphadenopathy noted in the cervical, supraclavicular, or axillary nodes Heart:regular rate and rhythm, without any murmurs or gallops.   Lung: Clear to auscultation without any rhonchi, wheezes or dullness to percussion. Abdomin: Soft, nontender without any shifting dullness or ascites. Musculoskeletal: No clubbing or cyanosis. Neurological: No motor or sensory deficits. Skin: No rashes or lesions.                   Lab Results: Lab Results  Component Value Date   WBC 12.4 (H) 11/25/2020   HGB 15.1 11/25/2020   HCT 41.8 11/25/2020   MCV 88.2  11/25/2020   PLT 210 11/25/2020     Chemistry      Component Value Date/Time   NA 141 11/25/2020 1122   K 4.1 11/25/2020 1122   CL 106 11/25/2020 1122   CO2 25 11/25/2020 1122   BUN 15 11/25/2020 1122   CREATININE 0.90 11/25/2020 1122      Component Value Date/Time   CALCIUM 9.4 11/25/2020 1122   ALKPHOS 85 11/25/2020 1122   AST 21 11/25/2020 1122   ALT 38 11/25/2020 1122   BILITOT 0.7 11/25/2020 1122       Results for Dennis Patel (MRN [id number]) as of 12/02/2020 09:09  Ref. Range 08/12/2020 13:45 11/25/2020 11:22  Prostate Specific Ag, Serum Latest Ref Range: 0.0 - 4.0 ng/mL 0.4 0.2       Impression and Plan:   70 year old man with:   1.  Advanced prostate cancer with disease to the bone diagnosed in 2019.  He has castration-resistant disease at this time.  His disease status was updated at this time and treatment choices were reviewed.  He continues to have excellent PSA response which is down to 0.2.  The natural course of this disease and treatment choices were discussed.  Risks and benefits of continuing Zytiga were also reviewed including hypertension, edema and adrenal insufficiency.  Alternative treatment options including systemic chemotherapy were reiterated.  This will be deferred for the time being.   2.  Androgen deprivation therapy: He is on Eligard which will be continued indefinitely.  Next injection will be at the end of January 2022.   3.  Bone directed therapy: I recommended calcium and vitamin D supplements for the time being.  Delton See option has been deferred.   4.  Neuropathic pain: He continues to be on Neurontin at this time.   5.  IV access: Port-A-Cath will continue to be in place and flushed periodically.  6.  Prognosis and goals of care: Therapy remains palliative although aggressive measures are warranted.   7.  Hypokalemia: Resolved at this time with the normalization of his potassium.    8.  Follow-up: He will return in 2 months  for follow-up and Eligard injection.  30 minutes were spent on this encounter.  Time was dedicated to reviewing laboratory data, disease status update and outlining future plan of care  Zola Button, MD 11/16/20229:08 AM

## 2021-01-27 DIAGNOSIS — Z0000 Encounter for general adult medical examination without abnormal findings: Secondary | ICD-10-CM | POA: Diagnosis not present

## 2021-01-27 DIAGNOSIS — I10 Essential (primary) hypertension: Secondary | ICD-10-CM | POA: Diagnosis not present

## 2021-01-27 DIAGNOSIS — I70219 Atherosclerosis of native arteries of extremities with intermittent claudication, unspecified extremity: Secondary | ICD-10-CM | POA: Diagnosis not present

## 2021-01-27 DIAGNOSIS — R7309 Other abnormal glucose: Secondary | ICD-10-CM | POA: Diagnosis not present

## 2021-01-27 DIAGNOSIS — E7800 Pure hypercholesterolemia, unspecified: Secondary | ICD-10-CM | POA: Diagnosis not present

## 2021-02-09 ENCOUNTER — Inpatient Hospital Stay: Payer: Medicare Other | Attending: Oncology

## 2021-02-09 ENCOUNTER — Other Ambulatory Visit: Payer: Self-pay

## 2021-02-09 DIAGNOSIS — E876 Hypokalemia: Secondary | ICD-10-CM | POA: Insufficient documentation

## 2021-02-09 DIAGNOSIS — R519 Headache, unspecified: Secondary | ICD-10-CM | POA: Diagnosis not present

## 2021-02-09 DIAGNOSIS — M792 Neuralgia and neuritis, unspecified: Secondary | ICD-10-CM | POA: Diagnosis not present

## 2021-02-09 DIAGNOSIS — Z5111 Encounter for antineoplastic chemotherapy: Secondary | ICD-10-CM | POA: Insufficient documentation

## 2021-02-09 DIAGNOSIS — G629 Polyneuropathy, unspecified: Secondary | ICD-10-CM | POA: Insufficient documentation

## 2021-02-09 DIAGNOSIS — C61 Malignant neoplasm of prostate: Secondary | ICD-10-CM | POA: Insufficient documentation

## 2021-02-09 DIAGNOSIS — Z79899 Other long term (current) drug therapy: Secondary | ICD-10-CM | POA: Diagnosis not present

## 2021-02-09 DIAGNOSIS — R110 Nausea: Secondary | ICD-10-CM | POA: Diagnosis not present

## 2021-02-09 DIAGNOSIS — I10 Essential (primary) hypertension: Secondary | ICD-10-CM | POA: Insufficient documentation

## 2021-02-09 DIAGNOSIS — R5383 Other fatigue: Secondary | ICD-10-CM | POA: Insufficient documentation

## 2021-02-09 DIAGNOSIS — Z95828 Presence of other vascular implants and grafts: Secondary | ICD-10-CM

## 2021-02-09 LAB — CBC WITH DIFFERENTIAL (CANCER CENTER ONLY)
Abs Immature Granulocytes: 0.06 10*3/uL (ref 0.00–0.07)
Basophils Absolute: 0 10*3/uL (ref 0.0–0.1)
Basophils Relative: 0 %
Eosinophils Absolute: 0.1 10*3/uL (ref 0.0–0.5)
Eosinophils Relative: 0 %
HCT: 41.5 % (ref 39.0–52.0)
Hemoglobin: 14.6 g/dL (ref 13.0–17.0)
Immature Granulocytes: 1 %
Lymphocytes Relative: 13 %
Lymphs Abs: 1.7 10*3/uL (ref 0.7–4.0)
MCH: 31.2 pg (ref 26.0–34.0)
MCHC: 35.2 g/dL (ref 30.0–36.0)
MCV: 88.7 fL (ref 80.0–100.0)
Monocytes Absolute: 0.7 10*3/uL (ref 0.1–1.0)
Monocytes Relative: 6 %
Neutro Abs: 10.3 10*3/uL — ABNORMAL HIGH (ref 1.7–7.7)
Neutrophils Relative %: 80 %
Platelet Count: 227 10*3/uL (ref 150–400)
RBC: 4.68 MIL/uL (ref 4.22–5.81)
RDW: 12 % (ref 11.5–15.5)
WBC Count: 12.9 10*3/uL — ABNORMAL HIGH (ref 4.0–10.5)
nRBC: 0 % (ref 0.0–0.2)

## 2021-02-09 LAB — CMP (CANCER CENTER ONLY)
ALT: 36 U/L (ref 0–44)
AST: 22 U/L (ref 15–41)
Albumin: 4.4 g/dL (ref 3.5–5.0)
Alkaline Phosphatase: 81 U/L (ref 38–126)
Anion gap: 8 (ref 5–15)
BUN: 15 mg/dL (ref 8–23)
CO2: 26 mmol/L (ref 22–32)
Calcium: 9.9 mg/dL (ref 8.9–10.3)
Chloride: 104 mmol/L (ref 98–111)
Creatinine: 0.9 mg/dL (ref 0.61–1.24)
GFR, Estimated: >60 mL/min (ref >60)
Glucose, Bld: 136 mg/dL — ABNORMAL HIGH (ref 70–99)
Potassium: 3.8 mmol/L (ref 3.5–5.1)
Sodium: 138 mmol/L (ref 135–145)
Total Bilirubin: 0.8 mg/dL (ref 0.3–1.2)
Total Protein: 6.8 g/dL (ref 6.5–8.1)

## 2021-02-09 MED ORDER — SODIUM CHLORIDE 0.9% FLUSH
10.0000 mL | INTRAVENOUS | Status: DC | PRN
  Administered 2021-02-09: 12:00:00 10 mL

## 2021-02-09 MED ORDER — HEPARIN SOD (PORK) LOCK FLUSH 100 UNIT/ML IV SOLN
500.0000 [IU] | Freq: Once | INTRAVENOUS | Status: AC | PRN
  Administered 2021-02-09: 12:00:00 500 [IU]

## 2021-02-10 LAB — PROSTATE-SPECIFIC AG, SERUM (LABCORP): Prostate Specific Ag, Serum: 0.2 ng/mL (ref 0.0–4.0)

## 2021-02-16 ENCOUNTER — Inpatient Hospital Stay: Payer: Medicare Other | Admitting: Oncology

## 2021-02-16 ENCOUNTER — Telehealth: Payer: Self-pay

## 2021-02-16 ENCOUNTER — Inpatient Hospital Stay: Payer: Medicare Other

## 2021-02-16 ENCOUNTER — Other Ambulatory Visit (HOSPITAL_COMMUNITY): Payer: Self-pay

## 2021-02-16 ENCOUNTER — Other Ambulatory Visit: Payer: Self-pay | Admitting: Oncology

## 2021-02-16 ENCOUNTER — Other Ambulatory Visit: Payer: Self-pay

## 2021-02-16 VITALS — BP 136/67 | HR 69 | Temp 97.9°F | Resp 17 | Ht 70.0 in | Wt 201.6 lb

## 2021-02-16 DIAGNOSIS — Z5111 Encounter for antineoplastic chemotherapy: Secondary | ICD-10-CM | POA: Diagnosis not present

## 2021-02-16 DIAGNOSIS — R5383 Other fatigue: Secondary | ICD-10-CM | POA: Diagnosis not present

## 2021-02-16 DIAGNOSIS — G629 Polyneuropathy, unspecified: Secondary | ICD-10-CM | POA: Diagnosis not present

## 2021-02-16 DIAGNOSIS — M792 Neuralgia and neuritis, unspecified: Secondary | ICD-10-CM | POA: Diagnosis not present

## 2021-02-16 DIAGNOSIS — C61 Malignant neoplasm of prostate: Secondary | ICD-10-CM

## 2021-02-16 DIAGNOSIS — E876 Hypokalemia: Secondary | ICD-10-CM | POA: Diagnosis not present

## 2021-02-16 DIAGNOSIS — Z95828 Presence of other vascular implants and grafts: Secondary | ICD-10-CM

## 2021-02-16 DIAGNOSIS — R110 Nausea: Secondary | ICD-10-CM | POA: Diagnosis not present

## 2021-02-16 DIAGNOSIS — R519 Headache, unspecified: Secondary | ICD-10-CM | POA: Diagnosis not present

## 2021-02-16 DIAGNOSIS — Z79899 Other long term (current) drug therapy: Secondary | ICD-10-CM | POA: Diagnosis not present

## 2021-02-16 DIAGNOSIS — I10 Essential (primary) hypertension: Secondary | ICD-10-CM | POA: Diagnosis not present

## 2021-02-16 MED ORDER — ABIRATERONE ACETATE 250 MG PO TABS
1000.0000 mg | ORAL_TABLET | Freq: Every day | ORAL | 9 refills | Status: DC
  Filled 2021-02-16 – 2021-02-19 (×2): qty 120, 30d supply, fill #0
  Filled 2021-03-17: qty 120, 30d supply, fill #1
  Filled 2021-04-12: qty 120, 30d supply, fill #2
  Filled 2021-05-13: qty 120, 30d supply, fill #3
  Filled 2021-06-10: qty 120, 30d supply, fill #4
  Filled 2021-07-06: qty 120, 30d supply, fill #5
  Filled 2021-08-09: qty 120, 30d supply, fill #6
  Filled 2021-09-07: qty 120, 30d supply, fill #7
  Filled 2021-10-05: qty 120, 30d supply, fill #8
  Filled 2021-11-05: qty 120, 30d supply, fill #9

## 2021-02-16 MED ORDER — LEUPROLIDE ACETATE (6 MONTH) 45 MG ~~LOC~~ KIT
45.0000 mg | PACK | Freq: Once | SUBCUTANEOUS | Status: AC
  Administered 2021-02-16: 45 mg via SUBCUTANEOUS
  Filled 2021-02-16: qty 45

## 2021-02-16 NOTE — Telephone Encounter (Signed)
Oral Oncology Patient Advocate Encounter   Received notification from [facility] that prior authorization for Dennis Patel is required.   PA submitted on CoverMyMeds Key [id number] Status is pending   Oral Oncology Clinic will continue to follow.  [place] [facility] [place] Phone [phone] Fax [phone] 02/16/2021 10:57 AM

## 2021-02-16 NOTE — Telephone Encounter (Signed)
Oral Oncology Patient Advocate Encounter  Prior Authorization for Dennis Patel has been approved.    PA# [id number] Effective dates: 02/16/21 through 02/16/22  Patients co-pay is $624.86  Oral Oncology Clinic will continue to follow.   [place] Patient Dennis Patel Phone [phone] Fax [phone] 02/16/2021 11:15 AM

## 2021-02-16 NOTE — Progress Notes (Signed)
Hematology and Oncology Follow Up Visit  Dennis Patel [id number] 1950/09/22 71 y.o. 02/16/2021 9:13 AM [name], [name], [name], [name], MD   Principle Diagnosis: 71 year old man with advanced prostate cancer with disease to the bone diagnosed in 2019.  He has castration-resistant after presenting with Gleason score 4+4 = 8 and a PSA of 48.  Prior Therapy:  Prostate biopsy obtained on December 05, 2017 showed a Gleason score of 4+4 = 8.  Taxotere 75 mg/m every 3 weeks started on January 31, 2018.  He completed 5 cycles of therapy in April 2020.  Current therapy:   Lupron 45 mg every 6 months starting in July 2020.  He will receive next injection on February 16, 2021.  Zytiga 1000 mg daily with prednisone 5 mg daily started in May 2020.   Interim History: Mr. Dennis Patel returns today for a follow-up visit.  Since the last visit, he reports feeling well without any major complaints.  He continues to tolerate Zytiga without any issues.  He does report some fatigue nausea and occasional headaches but has improved and has been tolerable overall.  He continues to have lower extremity painful neuropathy more on the right leg and foot than the left.  The symptoms are manageable with Neurontin 3 times a day.  Otherwise he is doing well without any major complaints.                      Medications: Updated on review. Current Outpatient Medications  Medication Sig Dispense Refill   abiraterone acetate (ZYTIGA) 250 MG tablet Take 4 tablets (1,000 mg total) by mouth daily. Take on an empty stomach 1 hour before or 2 hours after a meal 120 tablet 9   acetaminophen (TYLENOL) 325 MG tablet Take 650 mg by mouth every 6 (six) hours as needed.     amLODipine (NORVASC) 10 MG tablet Take 1 tablet (10 mg total) by mouth daily. 30 tablet 0   aspirin EC 81 MG tablet Take 81 mg by mouth daily. (Patient not taking: Reported on 09/02/2020)     atorvastatin (LIPITOR) 10 MG tablet Take 1  tablet (10 mg total) by mouth daily. 90 tablet 3   calcium-vitamin D (OSCAL WITH D) 250-125 MG-UNIT tablet Take 1 tablet by mouth daily.     clopidogrel (PLAVIX) 75 MG tablet TAKE 1 TABLET BY MOUTH EVERY DAY 90 tablet 3   furosemide (LASIX) 20 MG tablet TAKE 2 TABLETS BY MOUTH EVERY DAY 60 tablet 0   gabapentin (NEURONTIN) 300 MG capsule TAKE 1 CAPSULE BY MOUTH THREE TIMES A DAY 90 capsule 3   hydrochlorothiazide (HYDRODIURIL) 12.5 MG tablet Take 12.5 mg by mouth every morning.     Multiple Vitamins-Minerals (ONE DAILY MULTIVITAMIN MEN PO) Take 1 tablet by mouth daily.     predniSONE (DELTASONE) 5 MG tablet Take 1 tablet (5 mg total) by mouth daily with breakfast. 90 tablet 3   tamsulosin (FLOMAX) 0.4 MG CAPS capsule  (Patient not taking: Reported on 09/02/2020)     traZODone (DESYREL) 50 MG tablet TAKE 1 TABLET BY MOUTH AT BEDTIME AS NEEDED FOR SLEEP. 90 tablet 1   No current facility-administered medications for this visit.     Allergies: No Known Allergies    Physical Exam:         Blood pressure 136/67, pulse 69, temperature 97.9 F (36.6 C), temperature source Temporal, resp. rate 17, height 5\' 10"  (1.778 m), weight 201 lb 9.6 oz (91.4 kg), SpO2 100 %.  ECOG: 0     General appearance: Comfortable appearing without any discomfort Head: Normocephalic without any trauma Oropharynx: Mucous membranes are moist and pink without any thrush or ulcers. Eyes: Pupils are equal and round reactive to light. Lymph nodes: No cervical, supraclavicular, inguinal or axillary lymphadenopathy.   Heart:regular rate and rhythm.  S1 and S2 without leg edema. Lung: Clear without any rhonchi or wheezes.  No dullness to percussion. Abdomin: Soft, nontender, nondistended with good bowel sounds.  No hepatosplenomegaly. Musculoskeletal: No joint deformity or effusion.  Full range of motion noted. Neurological: No deficits noted on motor, sensory and deep tendon reflex exam. Skin: No  petechial rash or dryness.  Appeared moist.  Psychiatric: Mood and affect appeared appropriate.                    Lab Results: Lab Results  Component Value Date   WBC 12.9 (H) 02/09/2021   HGB 14.6 02/09/2021   HCT 41.5 02/09/2021   MCV 88.7 02/09/2021   PLT 227 02/09/2021     Chemistry      Component Value Date/Time   NA 138 02/09/2021 1225   K 3.8 02/09/2021 1225   CL 104 02/09/2021 1225   CO2 26 02/09/2021 1225   BUN 15 02/09/2021 1225   CREATININE 0.90 02/09/2021 1225      Component Value Date/Time   CALCIUM 9.9 02/09/2021 1225   ALKPHOS 81 02/09/2021 1225   AST 22 02/09/2021 1225   ALT 36 02/09/2021 1225   BILITOT 0.8 02/09/2021 1225             Impression and Plan:   71 year old man with:   1.  Castration-resistant advanced prostate cancer with disease to the bone diagnosed in 2019.    He remains on Zytiga with excellent PSA response and reasonable tolerance.  Risks and benefits of continuing this treatment were reviewed at this time.  Alternative treatment options were discussed including Jevtana chemotherapy.  Long-term complication occluding hypertension and adrenal sufficiency were reiterated.  He is agreeable to continue at this time.  Dose reduction will be considered if he has more toxicities.   2.  Androgen deprivation therapy: He will receive Eligard today and repeated in 6 months.  Complications including weight gain, hot flashes were reiterated.   3.  Bone directed therapy: Delton See has been discussed and deferred based on patient's preference.  He is on calcium and vitamin D supplements.   4.  Neuropathic pain: He is currently on Neurontin and overall pain is manageable.  Neuropathy is related to Taxotere chemotherapy.   5.  IV access: Port-A-Cath continues to be in use and flushed periodically.  6.  Prognosis and goals of care: His disease is incurable although aggressive measures are warranted given his reasonable  performance status and excellent response.   7.  Hypokalemia: Related to Zytiga and continue to replace as needed.  His potassium on February 09, 2021 was normal.    8.  Follow-up: In 2 months for repeat follow-up.  30 minutes were spent on this visit.  The time was dedicated to reviewing laboratory data, disease status update and outlining future plan of care discussion.  Zola Button, MD 1/31/20239:13 AM

## 2021-02-19 ENCOUNTER — Other Ambulatory Visit (HOSPITAL_COMMUNITY): Payer: Self-pay

## 2021-03-03 ENCOUNTER — Other Ambulatory Visit (HOSPITAL_COMMUNITY): Payer: Self-pay

## 2021-03-04 ENCOUNTER — Other Ambulatory Visit (HOSPITAL_COMMUNITY): Payer: Self-pay

## 2021-03-10 ENCOUNTER — Other Ambulatory Visit (HOSPITAL_COMMUNITY): Payer: Self-pay

## 2021-03-17 ENCOUNTER — Other Ambulatory Visit (HOSPITAL_COMMUNITY): Payer: Self-pay

## 2021-03-18 ENCOUNTER — Other Ambulatory Visit: Payer: Self-pay | Admitting: *Deleted

## 2021-03-19 ENCOUNTER — Other Ambulatory Visit (HOSPITAL_COMMUNITY): Payer: Self-pay

## 2021-04-08 ENCOUNTER — Other Ambulatory Visit (HOSPITAL_COMMUNITY): Payer: Self-pay

## 2021-04-12 ENCOUNTER — Other Ambulatory Visit (HOSPITAL_COMMUNITY): Payer: Self-pay

## 2021-04-15 ENCOUNTER — Other Ambulatory Visit: Payer: Self-pay

## 2021-04-15 ENCOUNTER — Inpatient Hospital Stay: Payer: Medicare Other | Attending: Oncology

## 2021-04-15 DIAGNOSIS — C61 Malignant neoplasm of prostate: Secondary | ICD-10-CM | POA: Diagnosis not present

## 2021-04-15 DIAGNOSIS — Z95828 Presence of other vascular implants and grafts: Secondary | ICD-10-CM

## 2021-04-15 LAB — CBC WITH DIFFERENTIAL (CANCER CENTER ONLY)
Abs Immature Granulocytes: 0.03 10*3/uL (ref 0.00–0.07)
Basophils Absolute: 0 10*3/uL (ref 0.0–0.1)
Basophils Relative: 0 %
Eosinophils Absolute: 0.1 10*3/uL (ref 0.0–0.5)
Eosinophils Relative: 1 %
HCT: 40.3 % (ref 39.0–52.0)
Hemoglobin: 14.4 g/dL (ref 13.0–17.0)
Immature Granulocytes: 0 %
Lymphocytes Relative: 15 %
Lymphs Abs: 1.4 10*3/uL (ref 0.7–4.0)
MCH: 31.2 pg (ref 26.0–34.0)
MCHC: 35.7 g/dL (ref 30.0–36.0)
MCV: 87.2 fL (ref 80.0–100.0)
Monocytes Absolute: 0.7 10*3/uL (ref 0.1–1.0)
Monocytes Relative: 8 %
Neutro Abs: 7 10*3/uL (ref 1.7–7.7)
Neutrophils Relative %: 76 %
Platelet Count: 207 10*3/uL (ref 150–400)
RBC: 4.62 MIL/uL (ref 4.22–5.81)
RDW: 12.1 % (ref 11.5–15.5)
WBC Count: 9.2 10*3/uL (ref 4.0–10.5)
nRBC: 0 % (ref 0.0–0.2)

## 2021-04-15 LAB — CMP (CANCER CENTER ONLY)
ALT: 26 U/L (ref 0–44)
AST: 16 U/L (ref 15–41)
Albumin: 4.2 g/dL (ref 3.5–5.0)
Alkaline Phosphatase: 84 U/L (ref 38–126)
Anion gap: 8 (ref 5–15)
BUN: 17 mg/dL (ref 8–23)
CO2: 27 mmol/L (ref 22–32)
Calcium: 9.4 mg/dL (ref 8.9–10.3)
Chloride: 107 mmol/L (ref 98–111)
Creatinine: 0.86 mg/dL (ref 0.61–1.24)
GFR, Estimated: >60 mL/min (ref >60)
Glucose, Bld: 163 mg/dL — ABNORMAL HIGH (ref 70–99)
Potassium: 3.2 mmol/L — ABNORMAL LOW (ref 3.5–5.1)
Sodium: 142 mmol/L (ref 135–145)
Total Bilirubin: 0.8 mg/dL (ref 0.3–1.2)
Total Protein: 6.4 g/dL — ABNORMAL LOW (ref 6.5–8.1)

## 2021-04-15 MED ORDER — ALTEPLASE 2 MG IJ SOLR: 2.0000 mg | Freq: Once | INTRAMUSCULAR | Status: DC | PRN

## 2021-04-15 MED ORDER — HEPARIN SOD (PORK) LOCK FLUSH 100 UNIT/ML IV SOLN
500.0000 [IU] | Freq: Once | INTRAVENOUS | Status: AC | PRN
  Administered 2021-04-15: 500 [IU]

## 2021-04-15 MED ORDER — SODIUM CHLORIDE 0.9% FLUSH
10.0000 mL | INTRAVENOUS | Status: DC | PRN
  Administered 2021-04-15: 10 mL

## 2021-04-16 LAB — PROSTATE-SPECIFIC AG, SERUM (LABCORP): Prostate Specific Ag, Serum: 0.2 ng/mL (ref 0.0–4.0)

## 2021-04-21 ENCOUNTER — Other Ambulatory Visit (HOSPITAL_COMMUNITY): Payer: Self-pay

## 2021-04-23 ENCOUNTER — Other Ambulatory Visit: Payer: Self-pay

## 2021-04-23 ENCOUNTER — Inpatient Hospital Stay: Payer: Medicare Other | Attending: Oncology | Admitting: Oncology

## 2021-04-23 VITALS — BP 149/65 | HR 87 | Temp 98.0°F | Resp 16 | Wt 203.0 lb

## 2021-04-23 DIAGNOSIS — G629 Polyneuropathy, unspecified: Secondary | ICD-10-CM | POA: Insufficient documentation

## 2021-04-23 DIAGNOSIS — C61 Malignant neoplasm of prostate: Secondary | ICD-10-CM | POA: Insufficient documentation

## 2021-04-23 DIAGNOSIS — Z9221 Personal history of antineoplastic chemotherapy: Secondary | ICD-10-CM | POA: Diagnosis not present

## 2021-04-23 DIAGNOSIS — Z7982 Long term (current) use of aspirin: Secondary | ICD-10-CM | POA: Insufficient documentation

## 2021-04-23 DIAGNOSIS — Z95828 Presence of other vascular implants and grafts: Secondary | ICD-10-CM

## 2021-04-23 DIAGNOSIS — I10 Essential (primary) hypertension: Secondary | ICD-10-CM | POA: Insufficient documentation

## 2021-04-23 DIAGNOSIS — E2740 Unspecified adrenocortical insufficiency: Secondary | ICD-10-CM | POA: Insufficient documentation

## 2021-04-23 DIAGNOSIS — E876 Hypokalemia: Secondary | ICD-10-CM | POA: Diagnosis not present

## 2021-04-23 DIAGNOSIS — Z7952 Long term (current) use of systemic steroids: Secondary | ICD-10-CM | POA: Diagnosis not present

## 2021-04-23 DIAGNOSIS — C7951 Secondary malignant neoplasm of bone: Secondary | ICD-10-CM | POA: Insufficient documentation

## 2021-04-23 DIAGNOSIS — R609 Edema, unspecified: Secondary | ICD-10-CM | POA: Insufficient documentation

## 2021-04-23 DIAGNOSIS — Z79899 Other long term (current) drug therapy: Secondary | ICD-10-CM | POA: Insufficient documentation

## 2021-04-23 MED ORDER — TRAZODONE HCL 50 MG PO TABS: 50.0000 mg | ORAL_TABLET | Freq: Every evening | ORAL | 1 refills | Status: DC | PRN

## 2021-04-23 MED ORDER — GABAPENTIN 300 MG PO CAPS: ORAL_CAPSULE | ORAL | 3 refills | Status: DC

## 2021-04-23 NOTE — Progress Notes (Signed)
Hematology and Oncology Follow Up Visit ? ?JERMIE HIPPE ?[id number] ?Jan 13, 1951 72 y.o. ?04/23/2021 8:02 AM ?[name], [name], [name], [name], MD  ? 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  He presented with with Gleason score 4+4 = 8 and a PSA of 48 at the time of diagnosis. ? ?Prior Therapy: ? ?Prostate biopsy obtained on December 05, 2017 showed a Gleason score of 4+4 = 8. ? ?Taxotere 75 mg/m? every 3 weeks started on January 31, 2018.  He completed 5 cycles of therapy in April 2020. ? ?Current therapy: ? ? Lupron 45 mg every 6 months starting in July 2020.  This will be repeated in July 2023. ? ?Zytiga 1000 mg daily with prednisone 5 mg daily started in May 2020. ? ? ?Interim History: Mr. Gladu is here for repeat evaluation.  Since the last visit, he reports no major changes in his health.  He continues to tolerate Zytiga without any major complaints.  He denies any nausea, vomiting or abdominal pain.  He denies any worsening edema.  Continues to have neuropathy which is manageable at this time with Neurontin. ? ? ? ? ? ? ? ? ? ? ? ? ? ? ? ? ? ? ? ? ? ?Medications: Reviewed without changes. ?Current Outpatient Medications  ?Medication Sig Dispense Refill  ? abiraterone acetate (ZYTIGA) 250 MG tablet Take 4 tablets (1,000 mg total) by mouth daily. Take on an empty stomach 1 hour before or 2 hours after a meal 120 tablet 9  ? acetaminophen (TYLENOL) 325 MG tablet Take 650 mg by mouth every 6 (six) hours as needed.    ? amLODipine (NORVASC) 10 MG tablet Take 1 tablet (10 mg total) by mouth daily. 30 tablet 0  ? aspirin EC 81 MG tablet Take 81 mg by mouth daily. (Patient not taking: Reported on 09/02/2020)    ? atorvastatin (LIPITOR) 10 MG tablet Take 1 tablet (10 mg total) by mouth daily. 90 tablet 3  ? calcium-vitamin D (OSCAL WITH D) 250-125 MG-UNIT tablet Take 1 tablet by mouth daily.    ? clopidogrel (PLAVIX) 75 MG  tablet TAKE 1 TABLET BY MOUTH EVERY DAY 90 tablet 3  ? furosemide (LASIX) 20 MG tablet TAKE 2 TABLETS BY MOUTH EVERY DAY 60 tablet 0  ? gabapentin (NEURONTIN) 300 MG capsule TAKE 1 CAPSULE BY MOUTH THREE TIMES A DAY 90 capsule 3  ? hydrochlorothiazide (HYDRODIURIL) 12.5 MG tablet Take 12.5 mg by mouth every morning.    ? Multiple Vitamins-Minerals (ONE DAILY MULTIVITAMIN MEN PO) Take 1 tablet by mouth daily.    ? predniSONE (DELTASONE) 5 MG tablet Take 1 tablet (5 mg total) by mouth daily with breakfast. 90 tablet 3  ? tamsulosin (FLOMAX) 0.4 MG CAPS capsule  (Patient not taking: Reported on 09/02/2020)    ? traZODone (DESYREL) 50 MG tablet TAKE 1 TABLET BY MOUTH AT BEDTIME AS NEEDED FOR SLEEP. 90 tablet 1  ? ?No current facility-administered medications for this visit.  ? ? ? ?Allergies: No Known Allergies ? ? ? ?Physical Exam: ? ? ? Blood pressure (!) 149/65, pulse 87, temperature 98 ?F (36.7 ?C), resp. rate 16, weight 203 lb 0.2 oz (92.1 kg), SpO2 100 %. ? ? ? ? ? ? ? ? ? ? ? ? ? ?ECOG: 0 ? ? ?General appearance: Alert, awake without any distress. ?Head: Atraumatic without abnormalities ?Oropharynx: Without any thrush or ulcers. ?Eyes: No scleral icterus. ?Lymph nodes:  No lymphadenopathy noted in the cervical, supraclavicular, or axillary nodes ?Heart:regular rate and rhythm, without any murmurs or gallops.   ?Lung: Clear to auscultation without any rhonchi, wheezes or dullness to percussion. ?Abdomin: Soft, nontender without any shifting dullness or ascites. ?Musculoskeletal: No clubbing or cyanosis. ?Neurological: No motor or sensory deficits. ?Skin: No rashes or lesions. ? ? ? ? ? ? ? ? ? ? ? ? ? ? ? ? ? ? ? ?Lab Results: ?Lab Results  ?Component Value Date  ? WBC 9.2 04/15/2021  ? HGB 14.4 04/15/2021  ? HCT 40.3 04/15/2021  ? MCV 87.2 04/15/2021  ? PLT 207 04/15/2021  ? ?  Chemistry   ?   ?Component Value Date/Time  ? NA 142 04/15/2021 1117  ? K 3.2 (L) 04/15/2021 1117  ? CL 107 04/15/2021 1117  ? CO2 27  04/15/2021 1117  ? BUN 17 04/15/2021 1117  ? CREATININE 0.86 04/15/2021 1117  ?    ?Component Value Date/Time  ? CALCIUM 9.4 04/15/2021 1117  ? ALKPHOS 84 04/15/2021 1117  ? AST 16 04/15/2021 1117  ? ALT 26 04/15/2021 1117  ? BILITOT 0.8 04/15/2021 1117  ?  ? ? ? ? ? ? ? ? ? ?Impression and Plan: ?  ?71 year old man with: ?  ?1.  Advanced prostate cancer with disease to the bone diagnosed in 2019.  He has castration-resistant disease at this time..   ? ?He has tolerated Zytiga without any major complications.  His PSA continues to be low.  Risks and benefits of continuing this treatment were discussed at this time.  Complications that include hypertension, edema and adrenal insufficiency were reiterated.  Alternative treatment options including salvage chemotherapy, PARP inhibitor among other options.  He is agreeable to continue at this time. ?  ?2.  Androgen deprivation therapy: Next Eligard will be given in July 2023. ? ? ?3.  Bone directed therapy: I recommended calcium and vitamin D supplements.  Delton See has been deferred for the time being. ? ? ?4.  Neuropathic pain: Related to Taxotere chemotherapy.  He is currently on Neurontin. ? ? ?5.  IV access: Port-A-Cath remains in place and will be flushed periodically. ? ?6.  Prognosis and goals of care: Therapy remains palliative although aggressive measures are warranted despite incurable disease. ? ? ?7.  Hypokalemia: His potassium has fluctuated close to normal range.  I recommended continued supplementation and potassium rich diet. ? ? ? 8.  Follow-up: He will return in 2 months for repeat evaluation. ? ?30 minutes were spent on this encounter.  The time was dedicated to reviewing laboratory data, disease status update and outlining future plan of care discussion. ? ?Zola Button, MD ?4/7/20238:02 AM ?

## 2021-05-12 ENCOUNTER — Other Ambulatory Visit (HOSPITAL_COMMUNITY): Payer: Self-pay

## 2021-05-13 ENCOUNTER — Other Ambulatory Visit (HOSPITAL_COMMUNITY): Payer: Self-pay

## 2021-05-20 ENCOUNTER — Other Ambulatory Visit (HOSPITAL_COMMUNITY): Payer: Self-pay

## 2021-06-10 ENCOUNTER — Other Ambulatory Visit (HOSPITAL_COMMUNITY): Payer: Self-pay

## 2021-06-17 ENCOUNTER — Other Ambulatory Visit (HOSPITAL_COMMUNITY): Payer: Self-pay

## 2021-06-28 ENCOUNTER — Other Ambulatory Visit: Payer: Self-pay

## 2021-06-28 ENCOUNTER — Inpatient Hospital Stay: Payer: Medicare Other | Attending: Oncology

## 2021-06-28 DIAGNOSIS — Z95828 Presence of other vascular implants and grafts: Secondary | ICD-10-CM

## 2021-06-28 DIAGNOSIS — C61 Malignant neoplasm of prostate: Secondary | ICD-10-CM | POA: Diagnosis present

## 2021-06-28 DIAGNOSIS — C7951 Secondary malignant neoplasm of bone: Secondary | ICD-10-CM | POA: Insufficient documentation

## 2021-06-28 DIAGNOSIS — M792 Neuralgia and neuritis, unspecified: Secondary | ICD-10-CM | POA: Diagnosis not present

## 2021-06-28 DIAGNOSIS — E876 Hypokalemia: Secondary | ICD-10-CM | POA: Insufficient documentation

## 2021-06-28 DIAGNOSIS — Z79899 Other long term (current) drug therapy: Secondary | ICD-10-CM | POA: Insufficient documentation

## 2021-06-28 LAB — CBC WITH DIFFERENTIAL (CANCER CENTER ONLY)
Abs Immature Granulocytes: 0.03 10*3/uL (ref 0.00–0.07)
Basophils Absolute: 0 10*3/uL (ref 0.0–0.1)
Basophils Relative: 0 %
Eosinophils Absolute: 0 10*3/uL (ref 0.0–0.5)
Eosinophils Relative: 0 %
HCT: 39.7 % (ref 39.0–52.0)
Hemoglobin: 14.3 g/dL (ref 13.0–17.0)
Immature Granulocytes: 0 %
Lymphocytes Relative: 17 %
Lymphs Abs: 1.6 10*3/uL (ref 0.7–4.0)
MCH: 31.8 pg (ref 26.0–34.0)
MCHC: 36 g/dL (ref 30.0–36.0)
MCV: 88.2 fL (ref 80.0–100.0)
Monocytes Absolute: 0.5 10*3/uL (ref 0.1–1.0)
Monocytes Relative: 6 %
Neutro Abs: 7 10*3/uL (ref 1.7–7.7)
Neutrophils Relative %: 77 %
Platelet Count: 198 10*3/uL (ref 150–400)
RBC: 4.5 MIL/uL (ref 4.22–5.81)
RDW: 11.9 % (ref 11.5–15.5)
WBC Count: 9.2 10*3/uL (ref 4.0–10.5)
nRBC: 0 % (ref 0.0–0.2)

## 2021-06-28 LAB — CMP (CANCER CENTER ONLY)
ALT: 30 U/L (ref 0–44)
AST: 26 U/L (ref 15–41)
Albumin: 4.3 g/dL (ref 3.5–5.0)
Alkaline Phosphatase: 76 U/L (ref 38–126)
Anion gap: 7 (ref 5–15)
BUN: 15 mg/dL (ref 8–23)
CO2: 27 mmol/L (ref 22–32)
Calcium: 9.7 mg/dL (ref 8.9–10.3)
Chloride: 106 mmol/L (ref 98–111)
Creatinine: 1.04 mg/dL (ref 0.61–1.24)
GFR, Estimated: >60 mL/min (ref >60)
Glucose, Bld: 155 mg/dL — ABNORMAL HIGH (ref 70–99)
Potassium: 3.7 mmol/L (ref 3.5–5.1)
Sodium: 140 mmol/L (ref 135–145)
Total Bilirubin: 0.9 mg/dL (ref 0.3–1.2)
Total Protein: 6.6 g/dL (ref 6.5–8.1)

## 2021-06-28 MED ORDER — HEPARIN SOD (PORK) LOCK FLUSH 100 UNIT/ML IV SOLN
500.0000 [IU] | Freq: Once | INTRAVENOUS | Status: AC | PRN
  Administered 2021-06-28: 500 [IU]

## 2021-06-28 MED ORDER — SODIUM CHLORIDE 0.9% FLUSH
10.0000 mL | INTRAVENOUS | Status: DC | PRN
  Administered 2021-06-28: 10 mL

## 2021-06-29 LAB — PROSTATE-SPECIFIC AG, SERUM (LABCORP): Prostate Specific Ag, Serum: 0.2 ng/mL (ref 0.0–4.0)

## 2021-06-30 ENCOUNTER — Other Ambulatory Visit: Payer: Self-pay

## 2021-06-30 ENCOUNTER — Inpatient Hospital Stay: Payer: Medicare Other | Admitting: Oncology

## 2021-06-30 VITALS — BP 124/66 | HR 63 | Temp 97.0°F | Resp 16 | Wt 199.1 lb

## 2021-06-30 DIAGNOSIS — E876 Hypokalemia: Secondary | ICD-10-CM | POA: Diagnosis not present

## 2021-06-30 DIAGNOSIS — C7951 Secondary malignant neoplasm of bone: Secondary | ICD-10-CM | POA: Diagnosis not present

## 2021-06-30 DIAGNOSIS — Z95828 Presence of other vascular implants and grafts: Secondary | ICD-10-CM | POA: Diagnosis not present

## 2021-06-30 DIAGNOSIS — C61 Malignant neoplasm of prostate: Secondary | ICD-10-CM | POA: Diagnosis not present

## 2021-06-30 DIAGNOSIS — Z79899 Other long term (current) drug therapy: Secondary | ICD-10-CM | POA: Diagnosis not present

## 2021-06-30 DIAGNOSIS — M792 Neuralgia and neuritis, unspecified: Secondary | ICD-10-CM | POA: Diagnosis not present

## 2021-06-30 NOTE — Progress Notes (Signed)
Hematology and Oncology Follow Up Visit  Dennis Patel [id number] May 26, 1950 71 y.o. 06/30/2021 1:22 PM [name], [name], [name], [name], MD   Principle Diagnosis: 71 year old man with advanced prostate cancer diagnosed in 2019.  He has castration-resistant with disease to the bone presented with with Gleason score 4+4 = 8 and a PSA of 48.   Prior Therapy:  Prostate biopsy obtained on December 05, 2017 showed a Gleason score of 4+4 = 8.  Taxotere 75 mg/m every 3 weeks started on January 31, 2018.  He completed 5 cycles of therapy in April 2020.  Current therapy:   Lupron 45 mg every 6 months starting in July 2020.  This will be repeated in July 2023.  Zytiga 1000 mg daily with prednisone 5 mg daily started in May 2020.   Interim History: Dennis Patel returns today for a follow-up visit.  Since last visit, he reports no major changes in his health.  He denies any recent hospitalizations or illnesses.  Continues to tolerate Zytiga without any issues.  He denies any nausea, vomiting or abdominal pain.  He denies any hospitalizations or illnesses.  He does report residual neuropathy from chemotherapy which is manageable with Neurontin 300 mg twice a day.                     Medications: Updated on review. Current Outpatient Medications  Medication Sig Dispense Refill   abiraterone acetate (ZYTIGA) 250 MG tablet Take 4 tablets (1,000 mg total) by mouth daily. Take on an empty stomach 1 hour before or 2 hours after a meal 120 tablet 9   acetaminophen (TYLENOL) 325 MG tablet Take 650 mg by mouth every 6 (six) hours as needed.     amLODipine (NORVASC) 10 MG tablet Take 1 tablet (10 mg total) by mouth daily. 30 tablet 0   aspirin EC 81 MG tablet Take 81 mg by mouth daily. (Patient not taking: Reported on 09/02/2020)     atorvastatin (LIPITOR) 10 MG tablet Take 1 tablet (10 mg total) by mouth daily. 90 tablet 3   calcium-vitamin D (OSCAL WITH D) 250-125 MG-UNIT tablet  Take 1 tablet by mouth daily.     clopidogrel (PLAVIX) 75 MG tablet TAKE 1 TABLET BY MOUTH EVERY DAY 90 tablet 3   furosemide (LASIX) 20 MG tablet TAKE 2 TABLETS BY MOUTH EVERY DAY 60 tablet 0   gabapentin (NEURONTIN) 300 MG capsule TAKE 1 CAPSULE BY MOUTH THREE TIMES A DAY 90 capsule 3   hydrochlorothiazide (HYDRODIURIL) 12.5 MG tablet Take 12.5 mg by mouth every morning.     Multiple Vitamins-Minerals (ONE DAILY MULTIVITAMIN MEN PO) Take 1 tablet by mouth daily.     predniSONE (DELTASONE) 5 MG tablet Take 1 tablet (5 mg total) by mouth daily with breakfast. 90 tablet 3   tamsulosin (FLOMAX) 0.4 MG CAPS capsule  (Patient not taking: Reported on 09/02/2020)     traZODone (DESYREL) 50 MG tablet Take 1 tablet (50 mg total) by mouth at bedtime as needed. for sleep 90 tablet 1   No current facility-administered medications for this visit.     Allergies: No Known Allergies    Physical Exam:       Blood pressure 124/66, pulse 63, temperature (!) 97 F (36.1 C), temperature source Tympanic, resp. rate 16, weight 199 lb 2 oz (90.3 kg), SpO2 96 %.            ECOG: 0    General appearance: Comfortable appearing without any discomfort  Head: Normocephalic without any trauma Oropharynx: Mucous membranes are moist and pink without any thrush or ulcers. Eyes: Pupils are equal and round reactive to light. Lymph nodes: No cervical, supraclavicular, inguinal or axillary lymphadenopathy.   Heart:regular rate and rhythm.  S1 and S2 without leg edema. Lung: Clear without any rhonchi or wheezes.  No dullness to percussion. Abdomin: Soft, nontender, nondistended with good bowel sounds.  No hepatosplenomegaly. Musculoskeletal: No joint deformity or effusion.  Full range of motion noted. Neurological: No deficits noted on motor, sensory and deep tendon reflex exam. Skin: No petechial rash or dryness.  Appeared moist.                      Lab Results: Lab Results   Component Value Date   WBC 9.2 06/28/2021   HGB 14.3 06/28/2021   HCT 39.7 06/28/2021   MCV 88.2 06/28/2021   PLT 198 06/28/2021     Chemistry      Component Value Date/Time   NA 140 06/28/2021 1234   K 3.7 06/28/2021 1234   CL 106 06/28/2021 1234   CO2 27 06/28/2021 1234   BUN 15 06/28/2021 1234   CREATININE 1.04 06/28/2021 1234      Component Value Date/Time   CALCIUM 9.7 06/28/2021 1234   ALKPHOS 76 06/28/2021 1234   AST 26 06/28/2021 1234   ALT 30 06/28/2021 1234   BILITOT 0.9 06/28/2021 1234         Latest Reference Range & Units 02/09/21 12:25 04/15/21 11:17 06/28/21 12:34  Prostate Specific Ag, Serum 0.0 - 4.0 ng/mL 0.2 0.2 0.2       Impression and Plan:   71 year old man with:   1.  Castration-resistant advanced prostate cancer with disease to the bone diagnosed in 2019.     He is currently on Zytiga with excellent PSA response and tolerance.  Risks and benefits of continuing this treatment were reviewed.  Complications that include hypertension, edema and adrenal sufficiency were reiterated.  He is agreeable to continue.  Because of his therapy options will be deferred at this time.   2.  Androgen deprivation therapy: He is currently on Eligard and will continue indefinitely every 6 months.  Next injection will be after August 16, 2021.   3.  Bone directed therapy: He is currently on calcium and vitamin D supplements.   4.  Neuropathic pain: He continues to be on Neurontin.  This is related to Taxotere chemotherapy.  No adjustment of the dosing is needed.   5.  IV access: Port-A-Cath currently in place and will be flushed periodically.  6.  Prognosis and goals of care: His disease is incurable although aggressive measures are warranted.   7.  Hypokalemia: His potassium is within normal range and does not require any supplementation.    8.  Follow-up: In 2 months for repeat follow-up.  30 minutes were dedicated to this visit.  The time was spent  on reviewing laboratory data, disease status update and outlining future plan of care discussion.  Zola Button, MD 6/14/20231:22 PM

## 2021-07-06 ENCOUNTER — Other Ambulatory Visit (HOSPITAL_COMMUNITY): Payer: Self-pay

## 2021-07-19 ENCOUNTER — Other Ambulatory Visit (HOSPITAL_COMMUNITY): Payer: Self-pay

## 2021-08-04 ENCOUNTER — Other Ambulatory Visit: Payer: Self-pay | Admitting: Vascular Surgery

## 2021-08-04 DIAGNOSIS — I739 Peripheral vascular disease, unspecified: Secondary | ICD-10-CM

## 2021-08-08 ENCOUNTER — Other Ambulatory Visit: Payer: Self-pay | Admitting: Oncology

## 2021-08-08 DIAGNOSIS — C61 Malignant neoplasm of prostate: Secondary | ICD-10-CM

## 2021-08-09 ENCOUNTER — Other Ambulatory Visit (HOSPITAL_COMMUNITY): Payer: Self-pay

## 2021-08-09 ENCOUNTER — Other Ambulatory Visit: Payer: Self-pay | Admitting: *Deleted

## 2021-08-09 DIAGNOSIS — C61 Malignant neoplasm of prostate: Secondary | ICD-10-CM

## 2021-08-09 MED ORDER — PREDNISONE 5 MG PO TABS: 5.0000 mg | ORAL_TABLET | Freq: Every day | ORAL | 3 refills | Status: DC

## 2021-08-10 NOTE — Telephone Encounter (Signed)
Refilled yesterday. Tristyn Demarest M Amariz Flamenco, RN  

## 2021-08-11 ENCOUNTER — Other Ambulatory Visit (HOSPITAL_COMMUNITY): Payer: Self-pay

## 2021-08-18 ENCOUNTER — Other Ambulatory Visit: Payer: Self-pay

## 2021-08-18 ENCOUNTER — Inpatient Hospital Stay: Payer: Medicare Other | Attending: Oncology

## 2021-08-18 DIAGNOSIS — Z7982 Long term (current) use of aspirin: Secondary | ICD-10-CM | POA: Insufficient documentation

## 2021-08-18 DIAGNOSIS — J069 Acute upper respiratory infection, unspecified: Secondary | ICD-10-CM | POA: Diagnosis not present

## 2021-08-18 DIAGNOSIS — G629 Polyneuropathy, unspecified: Secondary | ICD-10-CM | POA: Insufficient documentation

## 2021-08-18 DIAGNOSIS — Z79899 Other long term (current) drug therapy: Secondary | ICD-10-CM | POA: Insufficient documentation

## 2021-08-18 DIAGNOSIS — Z7902 Long term (current) use of antithrombotics/antiplatelets: Secondary | ICD-10-CM | POA: Diagnosis not present

## 2021-08-18 DIAGNOSIS — Z95828 Presence of other vascular implants and grafts: Secondary | ICD-10-CM

## 2021-08-18 DIAGNOSIS — Z7952 Long term (current) use of systemic steroids: Secondary | ICD-10-CM | POA: Insufficient documentation

## 2021-08-18 DIAGNOSIS — C7951 Secondary malignant neoplasm of bone: Secondary | ICD-10-CM | POA: Insufficient documentation

## 2021-08-18 DIAGNOSIS — I10 Essential (primary) hypertension: Secondary | ICD-10-CM | POA: Diagnosis not present

## 2021-08-18 DIAGNOSIS — Z192 Hormone resistant malignancy status: Secondary | ICD-10-CM | POA: Insufficient documentation

## 2021-08-18 DIAGNOSIS — Z79818 Long term (current) use of other agents affecting estrogen receptors and estrogen levels: Secondary | ICD-10-CM | POA: Insufficient documentation

## 2021-08-18 DIAGNOSIS — J40 Bronchitis, not specified as acute or chronic: Secondary | ICD-10-CM | POA: Insufficient documentation

## 2021-08-18 DIAGNOSIS — C61 Malignant neoplasm of prostate: Secondary | ICD-10-CM | POA: Diagnosis not present

## 2021-08-18 LAB — CBC WITH DIFFERENTIAL (CANCER CENTER ONLY)
Abs Immature Granulocytes: 0.04 10*3/uL (ref 0.00–0.07)
Basophils Absolute: 0 10*3/uL (ref 0.0–0.1)
Basophils Relative: 0 %
Eosinophils Absolute: 0 10*3/uL (ref 0.0–0.5)
Eosinophils Relative: 0 %
HCT: 41.2 % (ref 39.0–52.0)
Hemoglobin: 14.6 g/dL (ref 13.0–17.0)
Immature Granulocytes: 0 %
Lymphocytes Relative: 7 %
Lymphs Abs: 0.9 10*3/uL (ref 0.7–4.0)
MCH: 31.7 pg (ref 26.0–34.0)
MCHC: 35.4 g/dL (ref 30.0–36.0)
MCV: 89.4 fL (ref 80.0–100.0)
Monocytes Absolute: 1 10*3/uL (ref 0.1–1.0)
Monocytes Relative: 8 %
Neutro Abs: 10.2 10*3/uL — ABNORMAL HIGH (ref 1.7–7.7)
Neutrophils Relative %: 85 %
Platelet Count: 178 10*3/uL (ref 150–400)
RBC: 4.61 MIL/uL (ref 4.22–5.81)
RDW: 12.3 % (ref 11.5–15.5)
WBC Count: 12.1 10*3/uL — ABNORMAL HIGH (ref 4.0–10.5)
nRBC: 0 % (ref 0.0–0.2)

## 2021-08-18 LAB — CMP (CANCER CENTER ONLY)
ALT: 30 U/L (ref 0–44)
AST: 19 U/L (ref 15–41)
Albumin: 4.2 g/dL (ref 3.5–5.0)
Alkaline Phosphatase: 94 U/L (ref 38–126)
Anion gap: 5 (ref 5–15)
BUN: 16 mg/dL (ref 8–23)
CO2: 28 mmol/L (ref 22–32)
Calcium: 9.1 mg/dL (ref 8.9–10.3)
Chloride: 107 mmol/L (ref 98–111)
Creatinine: 0.93 mg/dL (ref 0.61–1.24)
GFR, Estimated: >60 mL/min (ref >60)
Glucose, Bld: 166 mg/dL — ABNORMAL HIGH (ref 70–99)
Potassium: 3.7 mmol/L (ref 3.5–5.1)
Sodium: 140 mmol/L (ref 135–145)
Total Bilirubin: 0.8 mg/dL (ref 0.3–1.2)
Total Protein: 6.6 g/dL (ref 6.5–8.1)

## 2021-08-18 MED ORDER — HEPARIN SOD (PORK) LOCK FLUSH 100 UNIT/ML IV SOLN
500.0000 [IU] | Freq: Once | INTRAVENOUS | Status: AC | PRN
  Administered 2021-08-18: 500 [IU]

## 2021-08-18 MED ORDER — SODIUM CHLORIDE 0.9% FLUSH
10.0000 mL | INTRAVENOUS | Status: DC | PRN
  Administered 2021-08-18: 10 mL

## 2021-08-20 LAB — PROSTATE-SPECIFIC AG, SERUM (LABCORP): Prostate Specific Ag, Serum: 0.2 ng/mL (ref 0.0–4.0)

## 2021-08-25 ENCOUNTER — Inpatient Hospital Stay: Payer: Medicare Other | Admitting: Oncology

## 2021-08-25 ENCOUNTER — Other Ambulatory Visit: Payer: Self-pay

## 2021-08-25 ENCOUNTER — Inpatient Hospital Stay: Payer: Medicare Other

## 2021-08-25 VITALS — BP 146/65 | HR 69 | Temp 97.5°F | Resp 18 | Wt 198.1 lb

## 2021-08-25 DIAGNOSIS — C61 Malignant neoplasm of prostate: Secondary | ICD-10-CM | POA: Diagnosis not present

## 2021-08-25 DIAGNOSIS — Z192 Hormone resistant malignancy status: Secondary | ICD-10-CM | POA: Diagnosis not present

## 2021-08-25 DIAGNOSIS — Z95828 Presence of other vascular implants and grafts: Secondary | ICD-10-CM | POA: Diagnosis not present

## 2021-08-25 DIAGNOSIS — Z7952 Long term (current) use of systemic steroids: Secondary | ICD-10-CM | POA: Diagnosis not present

## 2021-08-25 DIAGNOSIS — Z79818 Long term (current) use of other agents affecting estrogen receptors and estrogen levels: Secondary | ICD-10-CM | POA: Diagnosis not present

## 2021-08-25 DIAGNOSIS — I10 Essential (primary) hypertension: Secondary | ICD-10-CM | POA: Diagnosis not present

## 2021-08-25 DIAGNOSIS — C7951 Secondary malignant neoplasm of bone: Secondary | ICD-10-CM | POA: Diagnosis not present

## 2021-08-25 DIAGNOSIS — Z7902 Long term (current) use of antithrombotics/antiplatelets: Secondary | ICD-10-CM | POA: Diagnosis not present

## 2021-08-25 DIAGNOSIS — J069 Acute upper respiratory infection, unspecified: Secondary | ICD-10-CM | POA: Diagnosis not present

## 2021-08-25 DIAGNOSIS — Z7982 Long term (current) use of aspirin: Secondary | ICD-10-CM | POA: Diagnosis not present

## 2021-08-25 DIAGNOSIS — J40 Bronchitis, not specified as acute or chronic: Secondary | ICD-10-CM | POA: Diagnosis not present

## 2021-08-25 DIAGNOSIS — G629 Polyneuropathy, unspecified: Secondary | ICD-10-CM | POA: Diagnosis not present

## 2021-08-25 DIAGNOSIS — Z79899 Other long term (current) drug therapy: Secondary | ICD-10-CM | POA: Diagnosis not present

## 2021-08-25 MED ORDER — AZITHROMYCIN 250 MG PO TABS: ORAL_TABLET | ORAL | 0 refills | Status: DC

## 2021-08-25 MED ORDER — ALTEPLASE 2 MG IJ SOLR: 2.0000 mg | Freq: Once | INTRAMUSCULAR | Status: DC | PRN

## 2021-08-25 MED ORDER — LEUPROLIDE ACETATE (6 MONTH) 45 MG ~~LOC~~ KIT
45.0000 mg | PACK | Freq: Once | SUBCUTANEOUS | Status: AC
  Administered 2021-08-25: 45 mg via SUBCUTANEOUS
  Filled 2021-08-25: qty 45

## 2021-08-25 NOTE — Addendum Note (Signed)
Addended by: Wyatt Portela on: 08/25/2021 12:59 PM   Modules accepted: Orders

## 2021-08-25 NOTE — Progress Notes (Signed)
Hematology and Oncology Follow Up Visit  Dennis Patel [id number] 1950-08-26 71 y.o. 08/25/2021 12:23 PM [name], [name], [name], [name], MD   Principle Diagnosis: 71 year old man with castration-resistant advanced prostate cancer with disease to the bone diagnosed in 2019.  He presented with Gleason score 4+4 = 8 and a PSA of 48 and advanced disease at the time of diagnosis.  Prior Therapy:  Prostate biopsy obtained on December 05, 2017 showed a Gleason score of 4+4 = 8.  Taxotere 75 mg/m every 3 weeks started on January 31, 2018.  He completed 5 cycles of therapy in April 2020.  Current therapy:   Lupron 45 mg every 6 months starting in July 2020.  He will receive that on August 25, 2021.  Zytiga 1000 mg daily with prednisone 5 mg daily started in May 2020.   Interim History: Dennis Patel returns today for a follow-up.  Since last visit, he reports no major changes in his health.  He developed a respiratory illness including sinus congestion and subsequently productive cough in the last 10 days.  He denies any fevers or chills but does report some arthralgias.  COVID test was negative.  He continues to tolerate Zytiga without any complaints at this time.  He denies any lower extremity edema or excessive fatigue.                    Medications: Reviewed without changes. Current Outpatient Medications  Medication Sig Dispense Refill   abiraterone acetate (ZYTIGA) 250 MG tablet Take 4 tablets (1,000 mg total) by mouth daily. Take on an empty stomach 1 hour before or 2 hours after a meal 120 tablet 9   acetaminophen (TYLENOL) 325 MG tablet Take 650 mg by mouth every 6 (six) hours as needed.     amLODipine (NORVASC) 10 MG tablet Take 1 tablet (10 mg total) by mouth daily. 30 tablet 0   aspirin EC 81 MG tablet Take 81 mg by mouth daily. (Patient not taking: Reported on 09/02/2020)     atorvastatin (LIPITOR) 10 MG tablet TAKE 1 TABLET BY MOUTH EVERY DAY 90 tablet 3    calcium-vitamin D (OSCAL WITH D) 250-125 MG-UNIT tablet Take 1 tablet by mouth daily.     clopidogrel (PLAVIX) 75 MG tablet TAKE 1 TABLET BY MOUTH EVERY DAY 90 tablet 3   furosemide (LASIX) 20 MG tablet TAKE 2 TABLETS BY MOUTH EVERY DAY 60 tablet 0   gabapentin (NEURONTIN) 300 MG capsule TAKE 1 CAPSULE BY MOUTH THREE TIMES A DAY 90 capsule 3   hydrochlorothiazide (HYDRODIURIL) 12.5 MG tablet Take 12.5 mg by mouth every morning.     Multiple Vitamins-Minerals (ONE DAILY MULTIVITAMIN MEN PO) Take 1 tablet by mouth daily.     predniSONE (DELTASONE) 5 MG tablet Take 1 tablet (5 mg total) by mouth daily with breakfast. 90 tablet 3   tamsulosin (FLOMAX) 0.4 MG CAPS capsule  (Patient not taking: Reported on 09/02/2020)     traZODone (DESYREL) 50 MG tablet Take 1 tablet (50 mg total) by mouth at bedtime as needed. for sleep 90 tablet 1   No current facility-administered medications for this visit.     Allergies: No Known Allergies    Physical Exam:         Blood pressure (!) 146/65, pulse 69, temperature (!) 97.5 F (36.4 C), resp. rate 18, weight 198 lb 1.6 oz (89.9 kg), SpO2 94 %.           ECOG: 0  General appearance: Alert, awake without any distress. Head: Atraumatic without abnormalities Oropharynx: Without any thrush or ulcers. Eyes: No scleral icterus. Lymph nodes: No lymphadenopathy noted in the cervical, supraclavicular, or axillary nodes Heart:regular rate and rhythm, without any murmurs or gallops.   Lung: Clear to auscultation without any rhonchi, wheezes or dullness to percussion. Abdomin: Soft, nontender without any shifting dullness or ascites. Musculoskeletal: No clubbing or cyanosis. Neurological: No motor or sensory deficits. Skin: No rashes or lesions.                     Lab Results: Lab Results  Component Value Date   WBC 12.1 (H) 08/18/2021   HGB 14.6 08/18/2021   HCT 41.2 08/18/2021   MCV 89.4 08/18/2021   PLT 178  08/18/2021     Chemistry      Component Value Date/Time   NA 140 08/18/2021 1321   K 3.7 08/18/2021 1321   CL 107 08/18/2021 1321   CO2 28 08/18/2021 1321   BUN 16 08/18/2021 1321   CREATININE 0.93 08/18/2021 1321      Component Value Date/Time   CALCIUM 9.1 08/18/2021 1321   ALKPHOS 94 08/18/2021 1321   AST 19 08/18/2021 1321   ALT 30 08/18/2021 1321   BILITOT 0.8 08/18/2021 1321         Latest Reference Range & Units 08/18/21 13:21  Prostate Specific Ag, Serum 0.0 - 4.0 ng/mL 0.2      Impression and Plan:   71 year old man with:   1.  Advanced prostate cancer with disease to the bone diagnosed in 2019.  He has castration-resistant at this time.  Risks and benefits of continuing Zytiga at this time were discussed.  Complications that include hypertension, weight gain, and others were reiterated.  He has tolerated very well with excellent PSA response and would like to continue.  Alternative treatment options including chemotherapy as well as Pluvicto would be considered.  He is agreeable to continue at this time.   2.  Androgen deprivation therapy: He will receive Eligard today and repeated in 6 months.  Complications that include weight gain hot flashes among others were reiterated.   3.  Bone directed therapy: Recommended calcium and vitamin D supplements with potential use for Xgeva if worsening bone metastasis detected.   4.  Neuropathic pain: He remains on Neurontin without any complications.   5.  IV access: Port-A-Cath will continue to be flushed periodically.  6.  Prognosis and goals of care: Therapy remains positive though aggressive measures are warranted.   7.  Hypokalemia: Resolved at this time without potassium is 3.7.  He continues to be on potassium supplements and potassium rich diet.  8.  Upper respiratory tract infection with bronchitis: Prescription for Z-Pak will be given to him.    9.  Follow-up: He will return in 3 months for a  follow-up.  30 minutes were spent on this visit.  The time was dedicated to reviewing laboratory data, disease status update and outlining future plan of care discussion.  Zola Button, MD 8/9/202312:23 PM

## 2021-08-26 ENCOUNTER — Telehealth: Payer: Self-pay | Admitting: Oncology

## 2021-08-26 NOTE — Telephone Encounter (Signed)
Scheduled per 8/9 los, pt has been called and confirmed

## 2021-08-31 ENCOUNTER — Telehealth: Payer: Self-pay | Admitting: *Deleted

## 2021-08-31 ENCOUNTER — Other Ambulatory Visit: Payer: Self-pay | Admitting: Family Medicine

## 2021-08-31 ENCOUNTER — Ambulatory Visit
Admission: RE | Admit: 2021-08-31 | Discharge: 2021-08-31 | Disposition: A | Discharge status: Home / Self Care | Payer: Medicare Other | Source: Ambulatory Visit | Attending: Family Medicine | Admitting: Family Medicine

## 2021-08-31 DIAGNOSIS — J22 Unspecified acute lower respiratory infection: Secondary | ICD-10-CM

## 2021-08-31 DIAGNOSIS — C61 Malignant neoplasm of prostate: Secondary | ICD-10-CM

## 2021-08-31 DIAGNOSIS — R079 Chest pain, unspecified: Secondary | ICD-10-CM | POA: Diagnosis not present

## 2021-08-31 DIAGNOSIS — R059 Cough, unspecified: Secondary | ICD-10-CM | POA: Diagnosis not present

## 2021-08-31 DIAGNOSIS — R062 Wheezing: Secondary | ICD-10-CM | POA: Diagnosis not present

## 2021-08-31 DIAGNOSIS — J988 Other specified respiratory disorders: Secondary | ICD-10-CM | POA: Diagnosis not present

## 2021-08-31 NOTE — Telephone Encounter (Signed)
Returned PC to patient, informed him of Dr. Hazeline Junker advice - if his sx's continue or get worse, he needs to see his PCP.  Patient verbalizes understanding.

## 2021-08-31 NOTE — Telephone Encounter (Signed)
-----   Message from Wyatt Portela, MD sent at 08/31/2021 10:58 AM EDT ----- No. If his symptoms get worse, he needs to see PCP ----- Message ----- From: Rolene Course, RN Sent: 08/31/2021  10:52 AM EDT To: Wyatt Portela, MD  This patient called, he finished his Z-pack prescribed by you on Sunday, but says his cough & chest congestion continue.  He is asking if he needs a refill on the Z-pack.  Please advise.

## 2021-08-31 NOTE — Progress Notes (Signed)
RN spoke with patient, patient is interested in genetic's due to his history of metastatic prostate cancer.   Referral placed.  Pt aware of next steps.  No further needs at this time.

## 2021-09-06 ENCOUNTER — Other Ambulatory Visit: Payer: Self-pay

## 2021-09-07 ENCOUNTER — Other Ambulatory Visit (HOSPITAL_COMMUNITY): Payer: Self-pay

## 2021-09-09 ENCOUNTER — Other Ambulatory Visit (HOSPITAL_COMMUNITY): Payer: Self-pay

## 2021-10-05 ENCOUNTER — Other Ambulatory Visit (HOSPITAL_COMMUNITY): Payer: Self-pay

## 2021-10-06 DIAGNOSIS — L821 Other seborrheic keratosis: Secondary | ICD-10-CM | POA: Diagnosis not present

## 2021-10-06 DIAGNOSIS — Z85828 Personal history of other malignant neoplasm of skin: Secondary | ICD-10-CM | POA: Diagnosis not present

## 2021-10-06 DIAGNOSIS — D044 Carcinoma in situ of skin of scalp and neck: Secondary | ICD-10-CM | POA: Diagnosis not present

## 2021-10-06 DIAGNOSIS — D692 Other nonthrombocytopenic purpura: Secondary | ICD-10-CM | POA: Diagnosis not present

## 2021-10-06 DIAGNOSIS — C44329 Squamous cell carcinoma of skin of other parts of face: Secondary | ICD-10-CM | POA: Diagnosis not present

## 2021-10-06 DIAGNOSIS — D225 Melanocytic nevi of trunk: Secondary | ICD-10-CM | POA: Diagnosis not present

## 2021-10-06 DIAGNOSIS — C44311 Basal cell carcinoma of skin of nose: Secondary | ICD-10-CM | POA: Diagnosis not present

## 2021-10-07 ENCOUNTER — Inpatient Hospital Stay: Payer: Medicare Other | Attending: Oncology

## 2021-10-07 DIAGNOSIS — C61 Malignant neoplasm of prostate: Secondary | ICD-10-CM | POA: Insufficient documentation

## 2021-10-07 DIAGNOSIS — Z452 Encounter for adjustment and management of vascular access device: Secondary | ICD-10-CM | POA: Diagnosis not present

## 2021-10-07 DIAGNOSIS — Z95828 Presence of other vascular implants and grafts: Secondary | ICD-10-CM

## 2021-10-07 MED ORDER — HEPARIN SOD (PORK) LOCK FLUSH 100 UNIT/ML IV SOLN
500.0000 [IU] | Freq: Once | INTRAVENOUS | Status: AC | PRN
  Administered 2021-10-07: 500 [IU]

## 2021-10-07 MED ORDER — SODIUM CHLORIDE 0.9% FLUSH
10.0000 mL | INTRAVENOUS | Status: DC | PRN
  Administered 2021-10-07: 10 mL

## 2021-10-07 NOTE — Progress Notes (Signed)
Patient came in for port flush, port flushed nicely but no blood return. He knows next visit to prepare for a longer appointment for cathflo if this is needed.

## 2021-10-11 ENCOUNTER — Encounter: Payer: Self-pay | Admitting: Oncology

## 2021-10-11 ENCOUNTER — Other Ambulatory Visit (HOSPITAL_COMMUNITY): Payer: Self-pay

## 2021-10-21 ENCOUNTER — Other Ambulatory Visit: Payer: Medicare Other

## 2021-10-21 ENCOUNTER — Encounter: Payer: Medicare Other | Admitting: Genetic Counselor

## 2021-10-23 ENCOUNTER — Other Ambulatory Visit: Payer: Self-pay | Admitting: Oncology

## 2021-10-25 ENCOUNTER — Encounter: Payer: Self-pay | Admitting: Oncology

## 2021-11-02 ENCOUNTER — Encounter: Payer: Self-pay | Admitting: Genetic Counselor

## 2021-11-02 ENCOUNTER — Other Ambulatory Visit: Payer: Self-pay

## 2021-11-02 ENCOUNTER — Inpatient Hospital Stay: Payer: Medicare Other

## 2021-11-02 ENCOUNTER — Inpatient Hospital Stay: Payer: Medicare Other | Attending: Oncology | Admitting: Genetic Counselor

## 2021-11-02 ENCOUNTER — Other Ambulatory Visit: Payer: Self-pay | Admitting: Genetic Counselor

## 2021-11-02 ENCOUNTER — Other Ambulatory Visit (HOSPITAL_COMMUNITY): Payer: Self-pay

## 2021-11-02 DIAGNOSIS — Z1379 Encounter for other screening for genetic and chromosomal anomalies: Secondary | ICD-10-CM

## 2021-11-02 DIAGNOSIS — Z8041 Family history of malignant neoplasm of ovary: Secondary | ICD-10-CM

## 2021-11-02 DIAGNOSIS — C61 Malignant neoplasm of prostate: Secondary | ICD-10-CM

## 2021-11-02 DIAGNOSIS — Z8546 Personal history of malignant neoplasm of prostate: Secondary | ICD-10-CM | POA: Diagnosis not present

## 2021-11-02 LAB — GENETIC SCREENING ORDER

## 2021-11-02 NOTE — Progress Notes (Signed)
REFERRING PROVIDER: Zola Button, MD  PRIMARY PROVIDER:  [name], Carlean Jews.[name], MD  PRIMARY REASON FOR VISIT:  1. Malignant neoplasm of prostate ([place])   2. Family history of ovarian cancer     HISTORY OF PRESENT ILLNESS:   Mr. Stocks, a 71 y.o. male, was seen for a [facility] cancer genetics consultation at the request of Dr. Alen Blew due to a personal and family history of cancer.  Mr. Lambert presents to clinic today to discuss the possibility of a hereditary predisposition to cancer, to discuss genetic testing, and to further clarify his future cancer risks, as well as potential cancer risks for family members.   In 2019, at the age of 71, Mr. Baskette was diagnosed with prostate cancer. He currently has metastatic prostate cancer. Mr. Kagel also reports a history of skin cancer including melanoma.   CANCER HISTORY:  Oncology History  Malignant neoplasm of prostate ([place])  12/29/2017 Initial Diagnosis   Malignant neoplasm of prostate ([place])   01/31/2018 - 04/28/2018 Chemotherapy   The patient had pegfilgrastim-cbqv (UDENYCA) injection 6 mg, 6 mg, Subcutaneous, Once, 5 of 6 cycles Administration: 6 mg (02/02/2018), 6 mg (02/24/2018), 6 mg (03/17/2018), 6 mg (04/07/2018), 6 mg (04/28/2018) DOCEtaxel (TAXOTERE) 170 mg in sodium chloride 0.9 % 250 mL chemo infusion, 75 mg/m2 = 170 mg, Intravenous,  Once, 5 of 6 cycles Administration: 170 mg (01/31/2018), 170 mg (02/22/2018), 170 mg (03/15/2018), 170 mg (04/05/2018), 170 mg (04/26/2018)  for chemotherapy treatment.    08/25/2020 Cancer Staging   Staging form: Prostate, AJCC 8th Edition - Clinical: Stage IVB (cTX, cNX, pM1b, Grade Group: 4) - Signed by Wyatt Portela, MD on 08/25/2020 Gleason score: 8 Histologic grading system: 5 grade system      Past Medical History:  Diagnosis Date   Atherosclerosis    BPH (benign prostatic hyperplasia)    Cancer (HCC)    Claudication (HCC)    Elevated PSA    Family history of adverse reaction to anesthesia     ' My Brother had an allergic reaction "   GERD (gastroesophageal reflux disease)    History of kidney stones    Peripheral artery disease ([place])    Peripheral vascular disease ([place])    prostate ca with bone mets dx'd 11/2017   Pure hypercholesterolemia    Stress reaction    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    Past Surgical History:  Procedure Laterality Date   ABDOMINAL ANGIOGRAM N/A 03/17/2011   Procedure: ABDOMINAL ANGIOGRAM;  Surgeon: Jettie Booze, MD;  Location: [facility] CATH LAB;  Service: Cardiovascular;  Laterality: N/A;   ABDOMINAL AORTOGRAM W/LOWER EXTREMITY N/A 03/17/2017   Procedure: ABDOMINAL AORTOGRAM W/LOWER EXTREMITY;  Surgeon: Angelia Mould, MD;  Location: [facility] CV LAB;  Service: Cardiovascular;  Laterality: N/A;   ABDOMINAL AORTOGRAM W/LOWER EXTREMITY N/A 09/08/2017   Procedure: ABDOMINAL AORTOGRAM W/LOWER EXTREMITY;  Surgeon: Angelia Mould, MD;  Location: [facility];  Service: Cardiovascular;  Laterality: N/A;   ILIAC ARTERY STENT     IR IMAGING GUIDED PORT INSERTION  01/30/2018   LOWER EXTREMITY ANGIOGRAM N/A 03/17/2011   Procedure: LOWER EXTREMITY ANGIOGRAM;  Surgeon: Jettie Booze, MD;  Location: [facility] CATH LAB;  Service: Cardiovascular;  Laterality: N/A;   PERIPHERAL VASCULAR INTERVENTION Left 09/08/2017   Procedure: PERIPHERAL VASCULAR INTERVENTION;  Surgeon: Angelia Mould, MD;  Location: [facility] LAB;  Service: Cardiovascular;  Laterality: Left;  Common iliac    Social History   Socioeconomic History  Marital status: Married    Spouse name: Not on file   Number of children: Not on file   Years of education: Not on file   Highest education level: Not on file  Occupational History   Not on file  Tobacco Use   Smoking status: Former    Types: Cigarettes    Quit date: 2005    Years since quitting: 18.8   Smokeless tobacco: Never  Vaping Use   Vaping Use: Never used  Substance and Sexual Activity   Alcohol use: Never    Drug use: Never   Sexual activity: Not on file  Other Topics Concern   Not on file  Social History Narrative   ** Merged History Encounter **       ** Merged History Encounter **       Social Determinants of Health   Financial Resource Strain: Not on file  Food Insecurity: Not on file  Transportation Needs: Not on file  Physical Activity: Not on file  Stress: Not on file  Social Connections: Not on file     FAMILY HISTORY:  We obtained a detailed, 4-generation family history.  Significant diagnoses are listed below: Family History  Problem Relation Age of Onset   Ovarian cancer Mother 81   Diabetes Mother    CAD Father    Diabetes Sister    Lymphoma Brother 49   Skin cancer Brother        basal and squamous cell   Ovarian cancer Maternal Grandmother    CAD Son    Diabetes Son    Heart disease Son    Ovarian cancer Maternal Great-grandmother      Mr. Touchette's brother was diagnosed with lymphoma in his 77s and has a history of non-melanoma skin cancer. His mother was diagnosed with ovarian cancer at age 34 and died at age 41. His maternal grandmother, 2 maternal great aunt's (grandmother's sisters), and maternal great grandmother (grandmother's mother) all had a history of ovarian cancer, they are all deceased. Mr. Trompeter is unaware of previous family history of genetic testing for hereditary cancer risks. There is no reported Ashkenazi Jewish ancestry.   GENETIC COUNSELING ASSESSMENT: Mr. Roughton is a 71 y.o. male with a personal and family history of cancer which is somewhat suggestive of a hereditary predisposition to cancer given his history of metastatic prostate cancer and family history of ovarian cancer. We, therefore, discussed and recommended the following at today's visit.   DISCUSSION: We discussed that 5 - 10% of cancer is hereditary, with most cases of prostate and ovarian cancer associated with BRCA1/2.  There are other genes that can be associated with  hereditary prostate and ovarian cancer syndromes.  We discussed that testing is beneficial for several reasons including knowing how to follow individuals after completing their treatment, identifying whether potential treatment options would be beneficial, and understanding if other family members could be at risk for cancer and allowing them to undergo genetic testing.   We reviewed the characteristics, features and inheritance patterns of hereditary cancer syndromes. We also discussed genetic testing, including the appropriate family members to test, the process of testing, insurance coverage and turn-around-time for results. We discussed the implications of a negative, positive, carrier and/or variant of uncertain significant result.   Mr. Yeldell  was offered a common hereditary cancer panel (47 genes) and an expanded pan-cancer panel (77 genes). Mr. Nusz was informed of the benefits and limitations of each panel, including that expanded pan-cancer panels contain  genes that do not have clear management guidelines at this point in time.  We also discussed that as the number of genes included on a panel increases, the chances of variants of uncertain significance increases. After considering the benefits and limitations of each gene panel, Mr. Hlavacek elected to have [facility] CancerNext-Expanded Panel.  The CancerNext-Expanded gene panel offered by [facility] and includes sequencing, rearrangement, and RNA analysis for the following 77 genes: AIP, ALK, APC, ATM, AXIN2, BAP1, BARD1, BLM, BMPR1A, BRCA1, BRCA2, BRIP1, CDC73, CDH1, CDK4, CDKN1B, CDKN2A, CHEK2, CTNNA1, DICER1, FANCC, FH, FLCN, GALNT12, KIF1B, LZTR1, MAX, MEN1, MET, MLH1, MSH2, MSH3, MSH6, MUTYH, NBN, NF1, NF2, NTHL1, PALB2, PHOX2B, PMS2, POT1, PRKAR1A, PTCH1, PTEN, RAD51C, RAD51D, RB1, RECQL, RET, SDHA, SDHAF2, SDHB, SDHC, SDHD, SMAD4, SMARCA4, SMARCB1, SMARCE1, STK11, SUFU, TMEM127, TP53, TSC1, TSC2, VHL and XRCC2 (sequencing and  deletion/duplication); EGFR, EGLN1, HOXB13, KIT, MITF, PDGFRA, POLD1, and POLE (sequencing only); EPCAM and GREM1 (deletion/duplication only).   Based on Mr. Simpson personal and family history of cancer, he meets medical criteria for genetic testing. Despite that he meets criteria, he may still have an out of pocket cost. We discussed that if his out of pocket cost for testing is over $100, the laboratory will call and confirm whether he wants to proceed with testing.  If the out of pocket cost of testing is less than $100 he will be billed by the genetic testing laboratory.   PLAN: After considering the risks, benefits, and limitations, Mr. Dunaj provided informed consent to pursue genetic testing and the blood sample was sent to [facility] for analysis of the CancerNext-Expanded Panel. Results should be available within approximately 2-3 weeks' time, at which point they will be disclosed by telephone to Mr. Cropper, as will any additional recommendations warranted by these results. Mr. Wenzlick will receive a summary of his genetic counseling visit and a copy of his results once available. This information will also be available in Epic.   Mr. Stigger questions were answered to his satisfaction today. Our contact information was provided should additional questions or concerns arise. Thank you for the referral and allowing [place] to share in the care of your patient.   Lucille Passy, MS, [facility] Genetic Counselor [email or web address] (P) [phone]  The patient was seen for a total of 25 minutes in face-to-face genetic counseling. The patient was seen alone.  Drs. Lindi Adie and/or Burr Medico were available to discuss this case as needed.   _______________________________________________________________________ For Office Staff:  Number of people involved in session: 1 Was an Intern/ student involved with case: no

## 2021-11-05 ENCOUNTER — Other Ambulatory Visit (HOSPITAL_COMMUNITY): Payer: Self-pay

## 2021-11-09 DIAGNOSIS — C44311 Basal cell carcinoma of skin of nose: Secondary | ICD-10-CM | POA: Diagnosis not present

## 2021-11-09 DIAGNOSIS — Z85828 Personal history of other malignant neoplasm of skin: Secondary | ICD-10-CM | POA: Diagnosis not present

## 2021-11-17 ENCOUNTER — Ambulatory Visit: Payer: Self-pay | Admitting: Genetic Counselor

## 2021-11-17 DIAGNOSIS — Z1379 Encounter for other screening for genetic and chromosomal anomalies: Secondary | ICD-10-CM

## 2021-11-17 IMAGING — NM NM BONE WHOLE BODY
2 series · 2 of 2 positions shown · non-contrast
Comparison: 06/05/2018

Correlation: CT chest abdomen pelvis 05/30/2019

CLINICAL DATA: Prostate cancer, staging

EXAM:
NUCLEAR MEDICINE WHOLE BODY BONE SCAN
TECHNIQUE: Whole body anterior and posterior images were obtained approximately
3 hours after intravenous injection of radiopharmaceutical.
RADIOPHARMACEUTICALS:  17.7 mCi 8echnetium-XXm MDP IV

[Series 1: whole body · 2.66mm/px · 1 of 1 slices shown (1 of 2)]
[im 1/1]
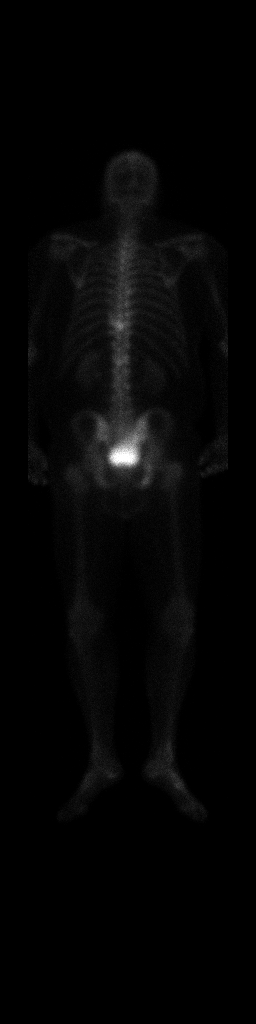

[Series 1: whole body · 2.66mm/px · 1 of 1 slices shown (2 of 2)]
[im 1/1]
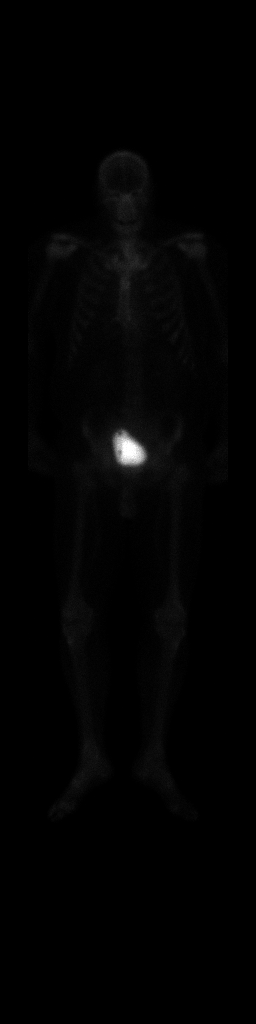

[2 of 2 positions shown; findings below may reference images not displayed]

FINDINGS: Mildly increased tracer uptake at T10 and L1 vertebral bodies as
well as RIGHT ischium and anterior LEFT seventh rib, all decreased
in intensity since prior exam.

Resolution of uptake previously identified at the upper thoracic
spine at approximately T2, RIGHT femur, and RIGHT costovertebral
junctions of multiple midthoracic vertebra.

No new sites of abnormal tracer uptake identified.

Distended urinary bladder.

Urinary tract and soft tissue distribution of tracer otherwise
normal.
IMPRESSION: Interval decrease in degree of uptake of tracer at multiple sites of
previously identified metastases as well as resolution of additional
sites at T2, RIGHT femur and RIGHT costovertebral junctions in the
midthoracic spine.

Overall findings represent interval response to therapy.

No new osseous metastatic lesions are identified scintigraphically.

## 2021-11-17 NOTE — Progress Notes (Signed)
HPI:   Dennis Patel was previously seen in the [facility] clinic due to a personal and family history of cancer and concerns regarding a hereditary predisposition to cancer. Please refer to our prior cancer genetics clinic note for more information regarding our discussion, assessment and recommendations, at the time. Dennis Patel recent genetic test results were disclosed to him, as were recommendations warranted by these results. These results and recommendations are discussed in more detail below.  CANCER HISTORY:  Oncology History  Malignant neoplasm of prostate ([place])  12/29/2017 Initial Diagnosis   Malignant neoplasm of prostate ([place])   01/31/2018 - 04/28/2018 Chemotherapy   The patient had pegfilgrastim-cbqv (UDENYCA) injection 6 mg, 6 mg, Subcutaneous, Once, 5 of 6 cycles Administration: 6 mg (02/02/2018), 6 mg (02/24/2018), 6 mg (03/17/2018), 6 mg (04/07/2018), 6 mg (04/28/2018) DOCEtaxel (TAXOTERE) 170 mg in sodium chloride 0.9 % 250 mL chemo infusion, 75 mg/m2 = 170 mg, Intravenous,  Once, 5 of 6 cycles Administration: 170 mg (01/31/2018), 170 mg (02/22/2018), 170 mg (03/15/2018), 170 mg (04/05/2018), 170 mg (04/26/2018)  for chemotherapy treatment.    08/25/2020 Cancer Staging   Staging form: Prostate, AJCC 8th Edition - Clinical: Stage IVB (cTX, cNX, pM1b, Grade Group: 4) - Signed by Wyatt Portela, MD on 08/25/2020 Gleason score: 8 Histologic grading system: 5 grade system     FAMILY HISTORY:  We obtained a detailed, 4-generation family history.  Significant diagnoses are listed below:      Family History  Problem Relation Age of Onset   Ovarian cancer Mother 2   Diabetes Mother     CAD Father     Diabetes Sister     Lymphoma Brother 53   Skin cancer Brother          basal and squamous cell   Ovarian cancer Maternal Grandmother     CAD Son     Diabetes Son     Heart disease Son     Ovarian cancer Maternal Great-grandmother         Dennis Patel's brother was  diagnosed with lymphoma in his 81s and has a history of non-melanoma skin cancer. His mother was diagnosed with ovarian cancer at age 25 and died at age 40. His maternal grandmother, 2 maternal great aunt's (grandmother's sisters), and maternal great grandmother (grandmother's mother) all had a history of ovarian cancer, they are all deceased. Dennis Patel is unaware of previous family history of genetic testing for hereditary cancer risks. There is no reported Ashkenazi Jewish ancestry.   GENETIC TEST RESULTS:  The [facility] CancerNext-Expanded Panel found no pathogenic mutations.   The CancerNext-Expanded gene panel offered by [facility] and includes sequencing, rearrangement, and RNA analysis for the following 77 genes: AIP, ALK, APC, ATM, AXIN2, BAP1, BARD1, BLM, BMPR1A, BRCA1, BRCA2, BRIP1, CDC73, CDH1, CDK4, CDKN1B, CDKN2A, CHEK2, CTNNA1, DICER1, FANCC, FH, FLCN, GALNT12, KIF1B, LZTR1, MAX, MEN1, MET, MLH1, MSH2, MSH3, MSH6, MUTYH, NBN, NF1, NF2, NTHL1, PALB2, PHOX2B, PMS2, POT1, PRKAR1A, PTCH1, PTEN, RAD51C, RAD51D, RB1, RECQL, RET, SDHA, SDHAF2, SDHB, SDHC, SDHD, SMAD4, SMARCA4, SMARCB1, SMARCE1, STK11, SUFU, TMEM127, TP53, TSC1, TSC2, VHL and XRCC2 (sequencing and deletion/duplication); EGFR, EGLN1, HOXB13, KIT, MITF, PDGFRA, POLD1, and POLE (sequencing only); EPCAM and GREM1 (deletion/duplication only).   The test report has been scanned into EPIC and is located under the Molecular Pathology section of the Results Review tab.  A portion of the result report is included below for reference. Genetic testing reported out on 11/16/2021.  Even though a pathogenic variant was not identified, possible explanations for the cancer in the family may include: There may be no hereditary risk for cancer in the family. The cancers in Dennis Patel and/or his family may be due to other genetic or environmental factors. There may be a gene mutation in one of these genes that current testing methods cannot  detect, but that chance is small. There could be another gene that has not yet been discovered, or that we have not yet tested, that is responsible for the cancer diagnoses in the family.  It is also possible there is a hereditary cause for the cancer in the family that Dennis Patel did not inherit.  Therefore, it is important to remain in touch with cancer genetics in the future so that we can continue to offer Dennis Patel the most up to date genetic testing.   ADDITIONAL GENETIC TESTING:  We discussed with Dennis Patel that his genetic testing was fairly extensive.  If there are genes identified to increase cancer risk that can be analyzed in the future, we would be happy to discuss and coordinate this testing at that time.    CANCER SCREENING RECOMMENDATIONS:  Dennis Patel test result is considered negative (normal).  This means that we have not identified a hereditary cause for his personal and family history of cancer at this time.    An individual's cancer risk and medical management are not determined by genetic test results alone. Overall cancer risk assessment incorporates additional factors, including personal medical history, family history, and any available genetic information that may result in a personalized plan for cancer prevention and surveillance. Therefore, it is recommended he continue to follow the cancer management and screening guidelines provided by his oncology and primary healthcare provider.  RECOMMENDATIONS FOR FAMILY MEMBERS:   Since he did not inherit a mutation in a cancer predisposition gene included on this panel, his children could not have inherited a mutation from him in one of these genes. Other members of the family may still carry a pathogenic variant in one of these genes that Dennis Patel did not inherit. Based on the family history, we recommend his siblings have genetic counseling and testing.   FOLLOW-UP:  Cancer genetics is a rapidly advancing field and  it is possible that new genetic tests will be appropriate for him and/or his family members in the future. We encouraged him to remain in contact with cancer genetics on an annual basis so we can update his personal and family histories and let him know of advances in cancer genetics that may benefit this family.   Our contact number was provided. Dennis Patel questions were answered to his satisfaction, and he knows he is welcome to call us at anytime with additional questions or concerns.   Lucille Passy, MS, [facility] Genetic Counselor [email or web address] (P) [phone]

## 2021-11-18 ENCOUNTER — Encounter: Payer: Self-pay | Admitting: Oncology

## 2021-11-18 ENCOUNTER — Telehealth: Payer: Self-pay | Admitting: Pharmacy Technician

## 2021-11-18 ENCOUNTER — Other Ambulatory Visit (HOSPITAL_COMMUNITY): Payer: Self-pay

## 2021-11-18 NOTE — Telephone Encounter (Signed)
Oral Oncology Patient Advocate Encounter  Was successful in securing patient a $8,000 grant from [facility] to provide copayment coverage for Abiraterone.  This will keep the out of pocket expense at $0.     [employer] ID: [id number]  I have spoken with the patient.   The billing information is as follows and has been shared with [facility].    RxBin: [id number] PCN: [id number] Member ID: [id number] Group ID: [id number] Dates of Eligibility: 10/19/21 through 10/19/22  Fund:  [place], CPhT-Adv Oncology Pharmacy Patient [place] Direct Number: [phone]  Fax: [phone]

## 2021-11-19 ENCOUNTER — Telehealth: Payer: Self-pay | Admitting: Genetic Counselor

## 2021-11-19 ENCOUNTER — Encounter: Payer: Self-pay | Admitting: Genetic Counselor

## 2021-11-19 DIAGNOSIS — Z1379 Encounter for other screening for genetic and chromosomal anomalies: Secondary | ICD-10-CM | POA: Insufficient documentation

## 2021-11-19 NOTE — Telephone Encounter (Signed)
I contacted [name] to discuss his genetic testing results. No pathogenic variants were identified in the 77 genes analyzed. Detailed clinic note to follow.  The test report has been scanned into EPIC and is located under the Molecular Pathology section of the Results Review tab.  A portion of the result report is included below for reference.   Lucille Passy, MS, [facility] Genetic Counselor [email or web address] (P) [phone]

## 2021-11-23 ENCOUNTER — Other Ambulatory Visit: Payer: Self-pay

## 2021-11-23 ENCOUNTER — Inpatient Hospital Stay: Payer: Medicare Other

## 2021-11-23 ENCOUNTER — Inpatient Hospital Stay: Payer: Medicare Other | Attending: Oncology

## 2021-11-23 DIAGNOSIS — E2740 Unspecified adrenocortical insufficiency: Secondary | ICD-10-CM | POA: Diagnosis not present

## 2021-11-23 DIAGNOSIS — R609 Edema, unspecified: Secondary | ICD-10-CM | POA: Diagnosis not present

## 2021-11-23 DIAGNOSIS — Z7952 Long term (current) use of systemic steroids: Secondary | ICD-10-CM | POA: Diagnosis not present

## 2021-11-23 DIAGNOSIS — I10 Essential (primary) hypertension: Secondary | ICD-10-CM | POA: Diagnosis not present

## 2021-11-23 DIAGNOSIS — R232 Flushing: Secondary | ICD-10-CM | POA: Insufficient documentation

## 2021-11-23 DIAGNOSIS — C7951 Secondary malignant neoplasm of bone: Secondary | ICD-10-CM | POA: Insufficient documentation

## 2021-11-23 DIAGNOSIS — Z7902 Long term (current) use of antithrombotics/antiplatelets: Secondary | ICD-10-CM | POA: Insufficient documentation

## 2021-11-23 DIAGNOSIS — Z7982 Long term (current) use of aspirin: Secondary | ICD-10-CM | POA: Diagnosis not present

## 2021-11-23 DIAGNOSIS — Z452 Encounter for adjustment and management of vascular access device: Secondary | ICD-10-CM | POA: Insufficient documentation

## 2021-11-23 DIAGNOSIS — E876 Hypokalemia: Secondary | ICD-10-CM | POA: Insufficient documentation

## 2021-11-23 DIAGNOSIS — Z79899 Other long term (current) drug therapy: Secondary | ICD-10-CM | POA: Insufficient documentation

## 2021-11-23 DIAGNOSIS — M792 Neuralgia and neuritis, unspecified: Secondary | ICD-10-CM | POA: Diagnosis not present

## 2021-11-23 DIAGNOSIS — C61 Malignant neoplasm of prostate: Secondary | ICD-10-CM | POA: Insufficient documentation

## 2021-11-23 DIAGNOSIS — Z95828 Presence of other vascular implants and grafts: Secondary | ICD-10-CM

## 2021-11-23 LAB — CBC WITH DIFFERENTIAL (CANCER CENTER ONLY)
Abs Immature Granulocytes: 0.07 10*3/uL (ref 0.00–0.07)
Basophils Absolute: 0 10*3/uL (ref 0.0–0.1)
Basophils Relative: 0 %
Eosinophils Absolute: 0.1 10*3/uL (ref 0.0–0.5)
Eosinophils Relative: 1 %
HCT: 41.2 % (ref 39.0–52.0)
Hemoglobin: 14.3 g/dL (ref 13.0–17.0)
Immature Granulocytes: 1 %
Lymphocytes Relative: 17 %
Lymphs Abs: 2 10*3/uL (ref 0.7–4.0)
MCH: 31.3 pg (ref 26.0–34.0)
MCHC: 34.7 g/dL (ref 30.0–36.0)
MCV: 90.2 fL (ref 80.0–100.0)
Monocytes Absolute: 0.9 10*3/uL (ref 0.1–1.0)
Monocytes Relative: 8 %
Neutro Abs: 8.5 10*3/uL — ABNORMAL HIGH (ref 1.7–7.7)
Neutrophils Relative %: 73 %
Platelet Count: 211 10*3/uL (ref 150–400)
RBC: 4.57 MIL/uL (ref 4.22–5.81)
RDW: 12.3 % (ref 11.5–15.5)
WBC Count: 11.6 10*3/uL — ABNORMAL HIGH (ref 4.0–10.5)
nRBC: 0 % (ref 0.0–0.2)

## 2021-11-23 LAB — CMP (CANCER CENTER ONLY)
ALT: 17 U/L (ref 0–44)
AST: 14 U/L — ABNORMAL LOW (ref 15–41)
Albumin: 4.2 g/dL (ref 3.5–5.0)
Alkaline Phosphatase: 81 U/L (ref 38–126)
Anion gap: 7 (ref 5–15)
BUN: 17 mg/dL (ref 8–23)
CO2: 28 mmol/L (ref 22–32)
Calcium: 9.5 mg/dL (ref 8.9–10.3)
Chloride: 106 mmol/L (ref 98–111)
Creatinine: 1.03 mg/dL (ref 0.61–1.24)
GFR, Estimated: >60 mL/min (ref >60)
Glucose, Bld: 98 mg/dL (ref 70–99)
Potassium: 4.4 mmol/L (ref 3.5–5.1)
Sodium: 141 mmol/L (ref 135–145)
Total Bilirubin: 0.6 mg/dL (ref 0.3–1.2)
Total Protein: 6.4 g/dL — ABNORMAL LOW (ref 6.5–8.1)

## 2021-11-23 MED ORDER — HEPARIN SOD (PORK) LOCK FLUSH 100 UNIT/ML IV SOLN
500.0000 [IU] | Freq: Once | INTRAVENOUS | Status: AC | PRN
  Administered 2021-11-23: 500 [IU]

## 2021-11-23 MED ORDER — SODIUM CHLORIDE 0.9% FLUSH
10.0000 mL | INTRAVENOUS | Status: DC | PRN
  Administered 2021-11-23: 10 mL

## 2021-11-24 LAB — PROSTATE-SPECIFIC AG, SERUM (LABCORP): Prostate Specific Ag, Serum: 0.1 ng/mL (ref 0.0–4.0)

## 2021-11-25 ENCOUNTER — Encounter: Payer: Self-pay | Admitting: Oncology

## 2021-11-30 ENCOUNTER — Other Ambulatory Visit: Payer: Self-pay

## 2021-11-30 ENCOUNTER — Inpatient Hospital Stay: Payer: Medicare Other | Admitting: Oncology

## 2021-11-30 ENCOUNTER — Other Ambulatory Visit: Payer: Self-pay | Admitting: Oncology

## 2021-11-30 ENCOUNTER — Other Ambulatory Visit (HOSPITAL_COMMUNITY): Payer: Self-pay

## 2021-11-30 VITALS — BP 139/65 | HR 74 | Temp 97.7°F | Resp 18 | Ht 70.0 in | Wt 202.4 lb

## 2021-11-30 DIAGNOSIS — C61 Malignant neoplasm of prostate: Secondary | ICD-10-CM

## 2021-11-30 DIAGNOSIS — I10 Essential (primary) hypertension: Secondary | ICD-10-CM | POA: Diagnosis not present

## 2021-11-30 DIAGNOSIS — E876 Hypokalemia: Secondary | ICD-10-CM | POA: Diagnosis not present

## 2021-11-30 DIAGNOSIS — M792 Neuralgia and neuritis, unspecified: Secondary | ICD-10-CM | POA: Diagnosis not present

## 2021-11-30 DIAGNOSIS — Z7952 Long term (current) use of systemic steroids: Secondary | ICD-10-CM | POA: Diagnosis not present

## 2021-11-30 DIAGNOSIS — Z95828 Presence of other vascular implants and grafts: Secondary | ICD-10-CM | POA: Diagnosis not present

## 2021-11-30 DIAGNOSIS — Z7982 Long term (current) use of aspirin: Secondary | ICD-10-CM | POA: Diagnosis not present

## 2021-11-30 DIAGNOSIS — R232 Flushing: Secondary | ICD-10-CM | POA: Diagnosis not present

## 2021-11-30 DIAGNOSIS — R609 Edema, unspecified: Secondary | ICD-10-CM | POA: Diagnosis not present

## 2021-11-30 DIAGNOSIS — Z7902 Long term (current) use of antithrombotics/antiplatelets: Secondary | ICD-10-CM | POA: Diagnosis not present

## 2021-11-30 DIAGNOSIS — C7951 Secondary malignant neoplasm of bone: Secondary | ICD-10-CM | POA: Diagnosis not present

## 2021-11-30 DIAGNOSIS — E2740 Unspecified adrenocortical insufficiency: Secondary | ICD-10-CM | POA: Diagnosis not present

## 2021-11-30 DIAGNOSIS — Z452 Encounter for adjustment and management of vascular access device: Secondary | ICD-10-CM | POA: Diagnosis not present

## 2021-11-30 DIAGNOSIS — Z79899 Other long term (current) drug therapy: Secondary | ICD-10-CM | POA: Diagnosis not present

## 2021-11-30 MED ORDER — ABIRATERONE ACETATE 250 MG PO TABS
1000.0000 mg | ORAL_TABLET | Freq: Every day | ORAL | 9 refills | Status: DC
  Filled 2021-11-30: qty 120, 30d supply, fill #0
  Filled 2021-12-28 – 2021-12-29 (×2): qty 120, 30d supply, fill #1
  Filled 2022-01-21: qty 120, 30d supply, fill #2
  Filled 2022-02-22: qty 120, 30d supply, fill #3
  Filled 2022-03-21: qty 120, 30d supply, fill #4
  Filled 2022-04-19: qty 120, 30d supply, fill #5
  Filled 2022-05-18: qty 120, 30d supply, fill #6
  Filled 2022-06-14: qty 120, 30d supply, fill #7
  Filled 2022-07-13: qty 120, 30d supply, fill #8
  Filled 2022-08-08: qty 120, 30d supply, fill #9

## 2021-11-30 NOTE — Progress Notes (Signed)
Hematology and Oncology Follow Up Visit  Dennis Patel [id number] 07/24/1950 71 y.o. 11/30/2021 2:44 PM Dennis Patel, L.Dennis, [name], [name], MD   Principle Diagnosis: 71 year old man with advanced prostate cancer with disease to the bone diagnosed in 2019.  He has castration-resistant after presenting with Gleason score 4+4 = 8 and a PSA of 48.  Prior Therapy:  Prostate biopsy obtained on December 05, 2017 showed a Gleason score of 4+4 = 8.  Taxotere 75 mg/m every 3 weeks started on January 31, 2018.  He completed 5 cycles of therapy in April 2020.  Current therapy:   Lupron 45 mg every 6 months starting in July 2020.  Last injection was given in August 2023.  Zytiga 1000 mg daily with prednisone 5 mg daily started in May 2020.   Interim History: Dennis Patel presents today for a follow-up visit.  Since last visit, he reports no major changes in his health.  He denies any recent hospitalizations or illnesses.  He denies any complications related to Zytiga.  He continues to have sensory neuropathy in his right lower extremity predominantly in his right foot.  He continues to ambulate and attends to activities of daily living otherwise.                    Medications: Updated on review. Current Outpatient Medications  Medication Sig Dispense Refill   abiraterone acetate (ZYTIGA) 250 MG tablet Take 4 tablets (1,000 mg total) by mouth daily. Take on an empty stomach 1 hour before or 2 hours after a meal 120 tablet 9   acetaminophen (TYLENOL) 325 MG tablet Take 650 mg by mouth every 6 (six) hours as needed.     amLODipine (NORVASC) 10 MG tablet Take 1 tablet (10 mg total) by mouth daily. 30 tablet 0   aspirin EC 81 MG tablet Take 81 mg by mouth daily. (Patient not taking: Reported on 09/02/2020)     atorvastatin (LIPITOR) 10 MG tablet TAKE 1 TABLET BY MOUTH EVERY DAY 90 tablet 3   azithromycin (ZITHROMAX Z-PAK) 250 MG tablet Take 2 tablets on day 1.  Take 1 tablet on  day 2 through day 5. 6 each 0   calcium-vitamin D (OSCAL WITH D) 250-125 MG-UNIT tablet Take 1 tablet by mouth daily.     clopidogrel (PLAVIX) 75 MG tablet TAKE 1 TABLET BY MOUTH EVERY DAY 90 tablet 3   furosemide (LASIX) 20 MG tablet TAKE 2 TABLETS BY MOUTH EVERY DAY 60 tablet 0   gabapentin (NEURONTIN) 300 MG capsule TAKE 1 CAPSULE BY MOUTH THREE TIMES A DAY 90 capsule 3   hydrochlorothiazide (HYDRODIURIL) 12.5 MG tablet Take 12.5 mg by mouth every morning.     Multiple Vitamins-Minerals (ONE DAILY MULTIVITAMIN MEN PO) Take 1 tablet by mouth daily.     predniSONE (DELTASONE) 5 MG tablet Take 1 tablet (5 mg total) by mouth daily with breakfast. 90 tablet 3   tamsulosin (FLOMAX) 0.4 MG CAPS capsule  (Patient not taking: Reported on 09/02/2020)     traZODone (DESYREL) 50 MG tablet TAKE 1 TABLET (50 MG TOTAL) BY MOUTH AT BEDTIME AS NEEDED. FOR SLEEP 90 tablet 1   No current facility-administered medications for this visit.     Allergies: No Known Allergies    Physical Exam:   Blood pressure 139/65, pulse 74, temperature 97.7 F (36.5 C), temperature source Temporal, resp. rate 18, height '5\' 10"'$  (1.778 m), weight 202 lb 6.4 oz (91.8 kg), SpO2 96 %.   ECOG: 0  General appearance: Comfortable appearing without any discomfort Head: Normocephalic without any trauma Oropharynx: Mucous membranes are moist and pink without any thrush or ulcers. Eyes: Pupils are equal and round reactive to light. Lymph nodes: No cervical, supraclavicular, inguinal or axillary lymphadenopathy.   Heart:regular rate and rhythm.  S1 and S2 without leg edema. Lung: Clear without any rhonchi or wheezes.  No dullness to percussion. Abdomin: Soft, nontender, nondistended with good bowel sounds.  No hepatosplenomegaly. Musculoskeletal: No joint deformity or effusion.  Full range of motion noted. Neurological: No deficits noted on motor, sensory and deep tendon reflex exam. Skin: No petechial rash or dryness.   Appeared moist.                       Lab Results: Lab Results  Component Value Date   WBC 11.6 (H) 11/23/2021   HGB 14.3 11/23/2021   HCT 41.2 11/23/2021   MCV 90.2 11/23/2021   PLT 211 11/23/2021     Chemistry      Component Value Date/Time   NA 141 11/23/2021 1524   K 4.4 11/23/2021 1524   CL 106 11/23/2021 1524   CO2 28 11/23/2021 1524   BUN 17 11/23/2021 1524   CREATININE 1.03 11/23/2021 1524      Component Value Date/Time   CALCIUM 9.5 11/23/2021 1524   ALKPHOS 81 11/23/2021 1524   AST 14 (L) 11/23/2021 1524   ALT 17 11/23/2021 1524   BILITOT 0.6 11/23/2021 1524        Latest Reference Range & Units 04/15/21 11:17 06/28/21 12:34 08/18/21 13:21 11/23/21 15:24  Prostate Specific Ag, Serum 0.0 - 4.0 ng/mL 0.2 0.2 0.2 0.1       Impression and Plan:   71 year old man with:   1.  Castration-resistant advanced prostate cancer with disease to the bone diagnosed in 2019.    He continues to have excellent response to [facility] with PSA down to 0.1.  Risks and benefits of continuing this treatment long-term were discussed.  Complications: Hypertension, and edema and adrenal insufficiency were reiterated.  Alternative treatment options in the future including switching to a PARP inhibitor if he harbors appropriate mutation, Pluvicto or Jevtana chemotherapy.   2.  Androgen deprivation therapy: He will receive Eligard today and repeated in 6 months.  Complications that include weight gain hot flashes among others were reiterated.   3.  Bone directed therapy: The role for Delton See was discussed at this time I have recommended calcium and vitamin D supplements as well as dental evaluation before consideration.  He will consider this option.  4.  Neuropathic pain: Related to his previous chemotherapy exposure.   5.  IV access: Port-A-Cath remains in place although has been malfunctioning.  Risks and benefits of a Port-A-Cath removal were discussed.  After  discussion today we will arrange for his port to be removed and can be reinserted in the future if needed.  6.  Prognosis and goals of care: His disease is incurable although aggressive measures are warranted given his excellent performance status.   7.  Hypokalemia: His potassium on November 7 was within normal range.  8.  Follow-up: He will return in 3 months for repeat evaluation and Eligard injection.  30 minutes were spent on this encounter.  Time was dedicated to reviewing laboratory data, disease status update and outlining future plan of care discussion.  Zola Button, MD 11/14/20232:44 PM

## 2021-12-01 ENCOUNTER — Encounter: Payer: Self-pay | Admitting: Genetic Counselor

## 2021-12-01 ENCOUNTER — Other Ambulatory Visit (HOSPITAL_COMMUNITY): Payer: Self-pay

## 2021-12-02 DIAGNOSIS — R059 Cough, unspecified: Secondary | ICD-10-CM | POA: Diagnosis not present

## 2021-12-02 DIAGNOSIS — J22 Unspecified acute lower respiratory infection: Secondary | ICD-10-CM | POA: Diagnosis not present

## 2021-12-06 ENCOUNTER — Other Ambulatory Visit (HOSPITAL_COMMUNITY): Payer: Self-pay

## 2021-12-07 ENCOUNTER — Other Ambulatory Visit (HOSPITAL_COMMUNITY): Payer: Self-pay | Admitting: Interventional Radiology

## 2021-12-07 ENCOUNTER — Ambulatory Visit (HOSPITAL_COMMUNITY)
Admission: RE | Admit: 2021-12-07 | Discharge: 2021-12-07 | Disposition: A | Discharge status: Home / Self Care | Payer: Medicare Other | Source: Ambulatory Visit | Attending: Oncology | Admitting: Oncology

## 2021-12-07 DIAGNOSIS — Z452 Encounter for adjustment and management of vascular access device: Secondary | ICD-10-CM | POA: Diagnosis not present

## 2021-12-07 DIAGNOSIS — Z95828 Presence of other vascular implants and grafts: Secondary | ICD-10-CM

## 2021-12-07 DIAGNOSIS — C61 Malignant neoplasm of prostate: Secondary | ICD-10-CM

## 2021-12-07 HISTORY — PX: IR CHEST FLUORO: IMG2383

## 2021-12-07 HISTORY — PX: IR REMOVAL TUN ACCESS W/ PORT W/O FL MOD SED: IMG2290

## 2021-12-07 HISTORY — PX: IR FLUORO GUIDE CV LINE RIGHT: IMG2283

## 2021-12-07 MED ORDER — LIDOCAINE HCL 1 % IJ SOLN
INTRAMUSCULAR | Status: AC
  Administered 2021-12-07 (×2): 10 mL via INTRADERMAL
  Filled 2021-12-07: qty 20

## 2021-12-07 MED ORDER — LIDOCAINE HCL 1 % IJ SOLN
INTRAMUSCULAR | Status: AC
  Administered 2021-12-07: 7 mL via INTRADERMAL
  Filled 2021-12-07: qty 20

## 2021-12-07 NOTE — Procedures (Signed)
Interventional Radiology Procedure Note  Procedure: Removal of a right IJ approach single lumen PowerPort.   Findings: The catheter was found to have calcified fibrin sheath, and required imaging and significant traction to remove.  Catheter removed in its entirety. Calcified fibrin sheath visible on final image.   Complications: None  Recommendations:  - Ok to shower tomorrow - Do not submerge for 7 days - Routine wound care   Signed,  Dulcy Fanny. Earleen Newport, DO

## 2021-12-08 ENCOUNTER — Ambulatory Visit
Admission: RE | Admit: 2021-12-08 | Discharge: 2021-12-08 | Disposition: A | Discharge status: Home / Self Care | Payer: Medicare Other | Source: Ambulatory Visit | Attending: Family Medicine | Admitting: Family Medicine

## 2021-12-08 ENCOUNTER — Other Ambulatory Visit: Payer: Self-pay | Admitting: Family Medicine

## 2021-12-08 DIAGNOSIS — J988 Other specified respiratory disorders: Secondary | ICD-10-CM

## 2021-12-08 DIAGNOSIS — R059 Cough, unspecified: Secondary | ICD-10-CM | POA: Diagnosis not present

## 2021-12-23 ENCOUNTER — Other Ambulatory Visit: Payer: Self-pay | Admitting: *Deleted

## 2021-12-23 DIAGNOSIS — I739 Peripheral vascular disease, unspecified: Secondary | ICD-10-CM

## 2021-12-27 ENCOUNTER — Encounter (HOSPITAL_COMMUNITY): Payer: Self-pay | Admitting: Radiology

## 2021-12-27 ENCOUNTER — Other Ambulatory Visit (HOSPITAL_COMMUNITY): Payer: Self-pay | Admitting: Interventional Radiology

## 2021-12-27 DIAGNOSIS — Z95828 Presence of other vascular implants and grafts: Secondary | ICD-10-CM

## 2021-12-27 DIAGNOSIS — C61 Malignant neoplasm of prostate: Secondary | ICD-10-CM

## 2021-12-28 ENCOUNTER — Other Ambulatory Visit (HOSPITAL_COMMUNITY): Payer: Self-pay

## 2021-12-29 ENCOUNTER — Other Ambulatory Visit: Payer: Self-pay

## 2021-12-29 ENCOUNTER — Other Ambulatory Visit (HOSPITAL_COMMUNITY): Payer: Self-pay

## 2021-12-30 ENCOUNTER — Ambulatory Visit (INDEPENDENT_AMBULATORY_CARE_PROVIDER_SITE_OTHER)
Admission: RE | Admit: 2021-12-30 | Discharge: 2021-12-30 | Disposition: A | Discharge status: Home / Self Care | Payer: Medicare Other | Source: Ambulatory Visit | Attending: Vascular Surgery | Admitting: Vascular Surgery

## 2021-12-30 ENCOUNTER — Ambulatory Visit (INDEPENDENT_AMBULATORY_CARE_PROVIDER_SITE_OTHER): Payer: Medicare Other | Admitting: Vascular Surgery

## 2021-12-30 ENCOUNTER — Encounter: Payer: Self-pay | Admitting: Vascular Surgery

## 2021-12-30 ENCOUNTER — Ambulatory Visit (HOSPITAL_COMMUNITY)
Admission: RE | Admit: 2021-12-30 | Discharge: 2021-12-30 | Disposition: A | Discharge status: Home / Self Care | Payer: Medicare Other | Source: Ambulatory Visit | Attending: Vascular Surgery | Admitting: Vascular Surgery

## 2021-12-30 VITALS — BP 147/77 | HR 66 | Temp 98.0°F | Resp 20 | Ht 70.0 in | Wt 193.0 lb

## 2021-12-30 DIAGNOSIS — I70219 Atherosclerosis of native arteries of extremities with intermittent claudication, unspecified extremity: Secondary | ICD-10-CM | POA: Diagnosis not present

## 2021-12-30 DIAGNOSIS — I739 Peripheral vascular disease, unspecified: Secondary | ICD-10-CM | POA: Diagnosis not present

## 2021-12-30 NOTE — Progress Notes (Signed)
REASON FOR VISIT:   Follow-up of peripheral arterial disease  MEDICAL ISSUES:   PERIPHERAL ARTERIAL DISEASE: This patient has stable right calf claudication.  He denies any rest pain or nonhealing ulcers.  His ABIs have remained stable over the last year.  His left common iliac artery stent is patent without evidence of significant restenosis.  I have encouraged him to stay as active as possible.  He is on Plavix and a statin.  I have ordered follow-up ABIs and a duplex of his stent in 1 year and he will be seen by the PAs at that time.  I have notified him that I will be retiring the end of September next year.  HPI:   Dennis Patel is a pleasant 71 y.o. male who I last saw on 09/02/2020.  He had undergone angioplasty and stenting of the left common iliac artery on 09/08/2017.  When I saw him last he had stable claudication and can walk 100 yards before experiencing symptoms.  He was not a smoker.  He was on Plavix and a statin.  His stent was open with some mildly elevated velocities suggesting a greater than 50% stenosis in the left common iliac artery stent.  He has some mild disease in the right external iliac artery also.  I recommended a follow-up visit in 1 year and he comes in for that visit.  Since I saw him last he states that he has stable right calf claudication.  This pain is brought on by ambulation and relieved with rest.  He does attribute this to chemotherapy he received for his stage IV prostate cancer with metastatic disease to his bones.  He denies any rest pain or nonhealing ulcers.  He smokes 2 cigars a day.  He is on Plavix and is on a statin.  He stopped taking his aspirin.  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    Family History  Problem Relation Age of Onset   Ovarian cancer Mother 17   Diabetes Mother    CAD Father    Diabetes Sister    Lymphoma Brother 105   Skin cancer Brother        basal and squamous cell   Ovarian cancer Maternal Grandmother    CAD Son    Diabetes Son    Heart disease Son    Ovarian cancer Maternal Great-grandmother     SOCIAL HISTORY: Social History   Tobacco Use   Smoking status: Former    Types: Cigarettes    Quit date: 2005    Years since quitting: 18.9   Smokeless tobacco: Never  Substance Use Topics   Alcohol use: Never    No Known Allergies  Current Outpatient Medications  Medication Sig Dispense Refill   abiraterone acetate (ZYTIGA) 250 MG tablet Take 4 tablets (1,000 mg total) by mouth daily. Take on an empty stomach 1 hour before or 2 hours  after a meal 120 tablet 9   acetaminophen (TYLENOL) 325 MG tablet Take 650 mg by mouth every 6 (six) hours as needed.     amLODipine (NORVASC) 10 MG tablet Take 1 tablet (10 mg total) by mouth daily. 30 tablet 0   aspirin EC 81 MG tablet Take 81 mg by mouth daily.     atorvastatin (LIPITOR) 10 MG tablet TAKE 1 TABLET BY MOUTH EVERY DAY 90 tablet 3   calcium-vitamin D (OSCAL WITH D) 250-125 MG-UNIT tablet Take 1 tablet by mouth daily.     clopidogrel (PLAVIX) 75 MG tablet TAKE 1 TABLET BY MOUTH EVERY DAY 90 tablet 3   gabapentin (NEURONTIN) 300 MG capsule TAKE 1 CAPSULE BY MOUTH THREE TIMES A DAY 90 capsule 3   hydrochlorothiazide (HYDRODIURIL) 12.5 MG tablet Take 12.5 mg by mouth every morning.     Multiple Vitamins-Minerals (ONE DAILY MULTIVITAMIN MEN PO) Take 1 tablet by mouth daily.     predniSONE (DELTASONE) 5 MG tablet Take 1 tablet (5 mg total) by mouth daily with breakfast. 90 tablet 3    traZODone (DESYREL) 50 MG tablet TAKE 1 TABLET (50 MG TOTAL) BY MOUTH AT BEDTIME AS NEEDED. FOR SLEEP 90 tablet 1   No current facility-administered medications for this visit.    REVIEW OF SYSTEMS:  '[X]'$  denotes positive finding, '[ ]'$  denotes negative finding Cardiac  Comments:  Chest pain or chest pressure:    Shortness of breath upon exertion:    Short of breath when lying flat:    Irregular heart rhythm:        Vascular    Pain in calf, thigh, or hip brought on by ambulation: x   Pain in feet at night that wakes you up from your sleep:     Blood clot in your veins:    Leg swelling:         Pulmonary    Oxygen at home:    Productive cough:     Wheezing:         Neurologic    Sudden weakness in arms or legs:     Sudden numbness in arms or legs:     Sudden onset of difficulty speaking or slurred speech:    Temporary loss of vision in one eye:     Problems with dizziness:         Gastrointestinal    Blood in stool:     Vomited blood:         Genitourinary    Burning when urinating:     Blood in urine:        Psychiatric    Major depression:         Hematologic    Bleeding problems:    Problems with blood clotting too easily:        Skin    Rashes or ulcers:        Constitutional    Fever or chills:     PHYSICAL EXAM:   Vitals:   12/30/21 1259  BP: (!) 147/77  Pulse: 66  Resp: 20  Temp: 98 F (36.7 C)  SpO2: 97%  Weight: 193 lb (87.5 kg)  Height: '5\' 10"'$  (1.778 m)    GENERAL: The patient is a well-nourished male, in no acute distress. The vital signs are documented above. CARDIAC: There is a regular rate and rhythm.  VASCULAR: I do not detect carotid bruits. He has slightly diminished femoral pulses bilaterally. I cannot palpate pedal pulses. He has  no significant lower extremity swelling. PULMONARY: There is good air exchange bilaterally without wheezing or rales. ABDOMEN: Soft and non-tender with normal pitched bowel sounds.  MUSCULOSKELETAL:  There are no major deformities or cyanosis. NEUROLOGIC: No focal weakness or paresthesias are detected. SKIN: There are no ulcers or rashes noted. PSYCHIATRIC: The patient has a normal affect.  DATA:    ARTERIAL DOPPLER STUDY: I have independently interpreted his arterial Doppler study today.  On the right side there is a monophasic posterior tibial signal with a multiphasic dorsalis pedis signal.  ABI is 46%.  Toe pressures 41 mmHg.  On the left side there is a monophasic dorsalis pedis and posterior tibial signal.  ABIs 68%.  Toe pressures 81 mmHg.  Of note his ABIs are essentially unchanged compared to a year ago.  ARTERIAL DUPLEX: I have independently interpreted his arterial duplex scan today.  The left common iliac artery stent is patent with biphasic flow throughout.  There is a less than 50% stenosis in the distal stent.   Deitra Mayo [facility] [phone]

## 2022-01-01 DIAGNOSIS — J101 Influenza due to other identified influenza virus with other respiratory manifestations: Secondary | ICD-10-CM | POA: Diagnosis not present

## 2022-01-01 DIAGNOSIS — R059 Cough, unspecified: Secondary | ICD-10-CM | POA: Diagnosis not present

## 2022-01-01 DIAGNOSIS — J209 Acute bronchitis, unspecified: Secondary | ICD-10-CM | POA: Diagnosis not present

## 2022-01-01 DIAGNOSIS — R062 Wheezing: Secondary | ICD-10-CM | POA: Diagnosis not present

## 2022-01-06 DIAGNOSIS — J45909 Unspecified asthma, uncomplicated: Secondary | ICD-10-CM | POA: Diagnosis not present

## 2022-01-21 ENCOUNTER — Encounter: Payer: Self-pay | Admitting: Oncology

## 2022-01-21 ENCOUNTER — Other Ambulatory Visit (HOSPITAL_COMMUNITY): Payer: Self-pay

## 2022-01-27 ENCOUNTER — Other Ambulatory Visit (HOSPITAL_COMMUNITY): Payer: Self-pay

## 2022-02-01 ENCOUNTER — Encounter: Payer: Self-pay | Admitting: Oncology

## 2022-02-04 DIAGNOSIS — E7800 Pure hypercholesterolemia, unspecified: Secondary | ICD-10-CM | POA: Diagnosis not present

## 2022-02-04 DIAGNOSIS — Z23 Encounter for immunization: Secondary | ICD-10-CM | POA: Diagnosis not present

## 2022-02-04 DIAGNOSIS — Z0000 Encounter for general adult medical examination without abnormal findings: Secondary | ICD-10-CM | POA: Diagnosis not present

## 2022-02-04 DIAGNOSIS — G620 Drug-induced polyneuropathy: Secondary | ICD-10-CM | POA: Diagnosis not present

## 2022-02-04 DIAGNOSIS — I10 Essential (primary) hypertension: Secondary | ICD-10-CM | POA: Diagnosis not present

## 2022-02-04 DIAGNOSIS — I70219 Atherosclerosis of native arteries of extremities with intermittent claudication, unspecified extremity: Secondary | ICD-10-CM | POA: Diagnosis not present

## 2022-02-10 ENCOUNTER — Telehealth: Payer: Self-pay | Admitting: Hematology and Oncology

## 2022-02-10 NOTE — Telephone Encounter (Signed)
Called patient regarding upcoming February appointments, patient is notified.

## 2022-02-22 ENCOUNTER — Other Ambulatory Visit (HOSPITAL_COMMUNITY): Payer: Self-pay

## 2022-02-23 DIAGNOSIS — H5203 Hypermetropia, bilateral: Secondary | ICD-10-CM | POA: Diagnosis not present

## 2022-02-25 ENCOUNTER — Other Ambulatory Visit (HOSPITAL_COMMUNITY): Payer: Self-pay

## 2022-03-03 ENCOUNTER — Ambulatory Visit: Payer: Medicare Other

## 2022-03-03 ENCOUNTER — Inpatient Hospital Stay (HOSPITAL_BASED_OUTPATIENT_CLINIC_OR_DEPARTMENT_OTHER): Payer: Medicare Other | Admitting: Hematology and Oncology

## 2022-03-03 ENCOUNTER — Ambulatory Visit: Payer: Medicare Other | Admitting: Oncology

## 2022-03-03 ENCOUNTER — Other Ambulatory Visit: Payer: Self-pay

## 2022-03-03 ENCOUNTER — Inpatient Hospital Stay: Payer: Medicare Other | Attending: Hematology and Oncology

## 2022-03-03 ENCOUNTER — Inpatient Hospital Stay: Payer: Medicare Other

## 2022-03-03 ENCOUNTER — Other Ambulatory Visit: Payer: Medicare Other

## 2022-03-03 VITALS — BP 145/58 | HR 73 | Temp 97.8°F | Resp 14 | Wt 193.2 lb

## 2022-03-03 DIAGNOSIS — Z87891 Personal history of nicotine dependence: Secondary | ICD-10-CM | POA: Diagnosis not present

## 2022-03-03 DIAGNOSIS — Z79818 Long term (current) use of other agents affecting estrogen receptors and estrogen levels: Secondary | ICD-10-CM | POA: Diagnosis not present

## 2022-03-03 DIAGNOSIS — C61 Malignant neoplasm of prostate: Secondary | ICD-10-CM | POA: Diagnosis not present

## 2022-03-03 DIAGNOSIS — Z95828 Presence of other vascular implants and grafts: Secondary | ICD-10-CM

## 2022-03-03 DIAGNOSIS — C7951 Secondary malignant neoplasm of bone: Secondary | ICD-10-CM | POA: Diagnosis not present

## 2022-03-03 LAB — CBC WITH DIFFERENTIAL (CANCER CENTER ONLY)
Abs Immature Granulocytes: 0.02 10*3/uL (ref 0.00–0.07)
Basophils Absolute: 0 10*3/uL (ref 0.0–0.1)
Basophils Relative: 0 %
Eosinophils Absolute: 0.1 10*3/uL (ref 0.0–0.5)
Eosinophils Relative: 1 %
HCT: 44.7 % (ref 39.0–52.0)
Hemoglobin: 15.8 g/dL (ref 13.0–17.0)
Immature Granulocytes: 0 %
Lymphocytes Relative: 13 %
Lymphs Abs: 1.3 10*3/uL (ref 0.7–4.0)
MCH: 31.7 pg (ref 26.0–34.0)
MCHC: 35.3 g/dL (ref 30.0–36.0)
MCV: 89.8 fL (ref 80.0–100.0)
Monocytes Absolute: 0.8 10*3/uL (ref 0.1–1.0)
Monocytes Relative: 8 %
Neutro Abs: 8.1 10*3/uL — ABNORMAL HIGH (ref 1.7–7.7)
Neutrophils Relative %: 78 %
Platelet Count: 209 10*3/uL (ref 150–400)
RBC: 4.98 MIL/uL (ref 4.22–5.81)
RDW: 12.8 % (ref 11.5–15.5)
WBC Count: 10.4 10*3/uL (ref 4.0–10.5)
nRBC: 0 % (ref 0.0–0.2)

## 2022-03-03 LAB — CMP (CANCER CENTER ONLY)
ALT: 21 U/L (ref 0–44)
AST: 20 U/L (ref 15–41)
Albumin: 3.9 g/dL (ref 3.5–5.0)
Alkaline Phosphatase: 65 U/L (ref 38–126)
Anion gap: 11 (ref 5–15)
BUN: 18 mg/dL (ref 8–23)
CO2: 25 mmol/L (ref 22–32)
Calcium: 9.3 mg/dL (ref 8.9–10.3)
Chloride: 105 mmol/L (ref 98–111)
Creatinine: 1.07 mg/dL (ref 0.61–1.24)
GFR, Estimated: >60 mL/min (ref >60)
Glucose, Bld: 141 mg/dL — ABNORMAL HIGH (ref 70–99)
Potassium: 3.6 mmol/L (ref 3.5–5.1)
Sodium: 141 mmol/L (ref 135–145)
Total Bilirubin: 0.9 mg/dL (ref 0.3–1.2)
Total Protein: 7 g/dL (ref 6.5–8.1)

## 2022-03-03 MED ORDER — LEUPROLIDE ACETATE (6 MONTH) 45 MG ~~LOC~~ KIT
45.0000 mg | PACK | Freq: Once | SUBCUTANEOUS | Status: AC
  Administered 2022-03-03: 45 mg via SUBCUTANEOUS
  Filled 2022-03-03: qty 45

## 2022-03-03 MED ORDER — GABAPENTIN 300 MG PO CAPS: ORAL_CAPSULE | ORAL | 3 refills | Status: DC

## 2022-03-03 NOTE — Progress Notes (Signed)
[place] Telephone:([phone]   [phone]  PROGRESS NOTE  Patient Care Team: [name], [name], MD as PCP - General (Family Medicine)  Hematological/Oncological History # Castration-Resistant Advanced Prostate Cancer  # Metastatic Spread to Bones  02/04/2017: Prostate biopsy showed a Gleason score of 4+4 = 8.  01/31/2018-04/2018: 5 cycles of Taxotere '75mg'$ /m2  11/30/2021: last visit with Dr. Alen Blew 03/03/2022: transfer care to Dr. Lorenso Courier   Interval History:  Dennis Patel 72 y.o. male with medical history significant for Castration-Resistant Advanced Prostate Cancer  presents for a follow up visit. The patient's last visit was on 11/30/2021 with Dr. Alen Blew. In the interim since the last visit he is continued on Eligard and [place].  On exam today Dennis Patel reports that he is tolerating Eligard "fairly good".  He reports that he has had no major changes in his health in the interim since her last visit.  He reports that he does have some occasional hot flashes but he is able to tolerate them well without any intervention.  He reports that occasionally does get some soreness where he gets his injection but rarely any redness or itching.  He notes that Dennis Patel is not causing any trouble and he is taking 1000 mg p.o. daily as prescribed.  He notes that it causes the "typical stuff".  He is not having any nausea, vomiting, or diarrhea.  He notes he does have some pain in his right leg due to residual neuropathy.  He also has been doing his best to try to walk and exercise at the gym.  He reports that he mows the grass a little bit but he has to sit down on occasion to catch his breath.  He notes he is stretching and doing the best he can but does have some continued difficulty with the flexibility in his ankle.  He does his best to try to avoid Advil therapy and is taking over-the-counter Tylenol.  He otherwise denies any fevers, chills, sweats, nausea, vomiting or diarrhea.   A full 10 point ROS is otherwise negative.  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    SURGICAL HISTORY: Past Surgical History:  Procedure Laterality Date   ABDOMINAL ANGIOGRAM N/A 03/17/2011   Procedure: ABDOMINAL ANGIOGRAM;  Surgeon: Jettie Booze, MD;  Location: [name] CATH LAB;  Service: Cardiovascular;  Laterality: N/A;   ABDOMINAL AORTOGRAM W/LOWER EXTREMITY N/A 03/17/2017   Procedure: ABDOMINAL AORTOGRAM W/LOWER EXTREMITY;  Surgeon: Angelia Mould, MD;  Location: [place] CV LAB;  Service: Cardiovascular;  Laterality: N/A;   ABDOMINAL AORTOGRAM W/LOWER EXTREMITY N/A 09/08/2017   Procedure: ABDOMINAL AORTOGRAM W/LOWER EXTREMITY;  Surgeon: Angelia Mould, MD;  Location: [place] CV LAB;  Service: Cardiovascular;  Laterality: N/A;   ILIAC ARTERY STENT     IR FLUORO GUIDE CV LINE RIGHT  12/07/2021   IR IMAGING GUIDED PORT INSERTION  01/30/2018   IR  REMOVAL TUN ACCESS W/ PORT W/O FL MOD SED  12/07/2021   LOWER EXTREMITY ANGIOGRAM N/A 03/17/2011   Procedure: LOWER EXTREMITY ANGIOGRAM;  Surgeon: Jettie Booze, MD;  Location: [facility] CATH LAB;  Service: Cardiovascular;  Laterality: N/A;   PERIPHERAL VASCULAR INTERVENTION Left 09/08/2017   Procedure: PERIPHERAL VASCULAR INTERVENTION;  Surgeon: Angelia Mould, MD;  Location: [place] CV LAB;  Service: Cardiovascular;  Laterality: Left;  Common iliac    SOCIAL HISTORY: Social History   Socioeconomic History   Marital status:  Married    Spouse name: Not on file   Number of children: Not on file   Years of education: Not on file   Highest education level: Not on file  Occupational History   Not on file  Tobacco Use   Smoking status: Former    Types: Cigarettes    Quit date: 2005    Years since quitting: 19.1   Smokeless tobacco: Never  Vaping Use   Vaping Use: Never used  Substance and Sexual Activity   Alcohol use: Never   Drug use: Never   Sexual activity: Not on file  Other Topics Concern   Not on file  Social History Narrative   ** Merged History Encounter **       ** Merged History Encounter **       Social Determinants of Health   Financial Resource Strain: Not on file  Food Insecurity: Not on file  Transportation Needs: Not on file  Physical Activity: Not on file  Stress: Not on file  Social Connections: Not on file  Intimate Partner Violence: Not on file    FAMILY HISTORY: Family History  Problem Relation Age of Onset   Ovarian cancer Mother 75   Diabetes Mother    CAD Father    Diabetes Sister    Lymphoma Brother 9   Skin cancer Brother        basal and squamous cell   Ovarian cancer Maternal Grandmother    CAD Son    Diabetes Son    Heart disease Son    Ovarian cancer Maternal Great-grandmother     ALLERGIES:  has No Known Allergies.  MEDICATIONS:  Current Outpatient Medications  Medication Sig Dispense Refill   abiraterone acetate (ZYTIGA) 250 MG tablet Take 4 tablets (1,000 mg total) by mouth daily. Take on an empty stomach 1 hour before or 2 hours after a meal 120 tablet 9   acetaminophen (TYLENOL) 325 MG tablet Take 650 mg by mouth every 6 (six) hours as needed.     amLODipine (NORVASC) 10 MG tablet Take 1 tablet (10 mg total) by mouth daily. 30 tablet 0   aspirin EC 81 MG tablet Take 81 mg by mouth daily.     atorvastatin (LIPITOR) 10 MG tablet TAKE 1 TABLET BY MOUTH EVERY DAY 90 tablet 3   calcium-vitamin D (OSCAL WITH D) 250-125 MG-UNIT tablet Take 1 tablet  by mouth daily.     clopidogrel (PLAVIX) 75 MG tablet TAKE 1 TABLET BY MOUTH EVERY DAY 90 tablet 3   gabapentin (NEURONTIN) 300 MG capsule TAKE 1 CAPSULE BY MOUTH THREE TIMES A DAY 90 capsule 3   hydrochlorothiazide (HYDRODIURIL) 12.5 MG tablet Take 12.5 mg by mouth every morning.     Multiple Vitamins-Minerals (ONE DAILY MULTIVITAMIN MEN PO) Take 1 tablet by mouth daily.     predniSONE (DELTASONE) 5 MG tablet Take 1 tablet (5 mg total) by mouth daily with breakfast. 90 tablet 3  traZODone (DESYREL) 50 MG tablet TAKE 1 TABLET (50 MG TOTAL) BY MOUTH AT BEDTIME AS NEEDED. FOR SLEEP 90 tablet 1   No current facility-administered medications for this visit.    REVIEW OF SYSTEMS:   Constitutional: ( - ) fevers, ( - )  chills , ( - ) night sweats Eyes: ( - ) blurriness of vision, ( - ) double vision, ( - ) watery eyes Ears, nose, mouth, throat, and face: ( - ) mucositis, ( - ) sore throat Respiratory: ( - ) cough, ( - ) dyspnea, ( - ) wheezes Cardiovascular: ( - ) palpitation, ( - ) chest discomfort, ( - ) lower extremity swelling Gastrointestinal:  ( - ) nausea, ( - ) heartburn, ( - ) change in bowel habits Skin: ( - ) abnormal skin rashes Lymphatics: ( - ) new lymphadenopathy, ( - ) easy bruising Neurological: ( - ) numbness, ( - ) tingling, ( - ) new weaknesses Behavioral/Psych: ( - ) mood change, ( - ) new changes  All other systems were reviewed with the patient and are negative.  PHYSICAL EXAMINATION: ECOG PERFORMANCE STATUS: 1 - Symptomatic but completely ambulatory  Vitals:   03/03/22 1019  BP: (!) 145/58  Pulse: 73  Resp: 14  Temp: 97.8 F (36.6 C)  SpO2: 100%   Filed Weights   03/03/22 1019  Weight: 193 lb 3.2 oz (87.6 kg)    GENERAL: Well-appearing elderly Caucasian male, alert, no distress and comfortable SKIN: skin color, texture, turgor are normal, no rashes or significant lesions EYES: conjunctiva are pink and non-injected, sclera clear LUNGS: clear to  auscultation and percussion with normal breathing effort HEART: regular rate & rhythm and no murmurs and no lower extremity edema Musculoskeletal: no cyanosis of digits and no clubbing  PSYCH: alert & oriented x 3, fluent speech NEURO: no focal motor/sensory deficits  LABORATORY DATA:  I have reviewed the data as listed    Latest Ref Rng & Units 03/03/2022    9:38 AM 11/23/2021    3:24 PM 08/18/2021    1:21 PM  CBC  WBC 4.0 - 10.5 K/uL 10.4  11.6  12.1   Hemoglobin 13.0 - 17.0 g/dL 15.8  14.3  14.6   Hematocrit 39.0 - 52.0 % 44.7  41.2  41.2   Platelets 150 - 400 K/uL 209  211  178        Latest Ref Rng & Units 03/03/2022    9:38 AM 11/23/2021    3:24 PM 08/18/2021    1:21 PM  CMP  Glucose 70 - 99 mg/dL 141  98  166   BUN 8 - 23 mg/dL '18  17  16   '$ Creatinine 0.61 - 1.24 mg/dL 1.07  1.03  0.93   Sodium 135 - 145 mmol/L 141  141  140   Potassium 3.5 - 5.1 mmol/L 3.6  4.4  3.7   Chloride 98 - 111 mmol/L 105  106  107   CO2 22 - 32 mmol/L '25  28  28   '$ Calcium 8.9 - 10.3 mg/dL 9.3  9.5  9.1   Total Protein 6.5 - 8.1 g/dL 7.0  6.4  6.6   Total Bilirubin 0.3 - 1.2 mg/dL 0.9  0.6  0.8   Alkaline Phos 38 - 126 U/L 65  81  94   AST 15 - 41 U/L '20  14  19   '$ ALT 0 - 44 U/L '21  17  30    '$ RADIOGRAPHIC  STUDIES: No results found.  ASSESSMENT & PLAN Dennis Patel 72 y.o. male with medical history significant for Castration-Resistant Advanced Prostate Cancer  presents for a follow up visit.  # Castration-Resistant Advanced Prostate Cancer  # Metastatic Spread to Bones  -- currently on Eligard '45mg'$  q 6 months, last dose on 08/2021 -- continue Zytiga 1000 mg daily with prednisone 5 mg daily, started in May 2020  -- Labs today show white blood cell count 10.4, hemoglobin 15.8, MCV 89.8, and platelets of 209.  Additionally creatinine is 1.07 with normal LFTs. --PSA at 0.1 on 03/03/2022  -- Plan to have patient return to clinic in 3 months time for labs and clinic visit and in 6 months for  repeat injection.  No orders of the defined types were placed in this encounter.   All questions were answered. The patient knows to call the clinic with any problems, questions or concerns.  A total of more than 40 minutes were spent on this encounter with face-to-face time and non-face-to-face time, including preparing to see the patient, ordering tests and/or medications, counseling the patient and coordination of care as outlined above.   Ledell Peoples, MD Department of Hematology/Oncology [place] [facility] Phone: [phone] Pager: [phone] Email: [email or web address]  03/13/2022 5:44 PM

## 2022-03-05 LAB — PROSTATE-SPECIFIC AG, SERUM (LABCORP): Prostate Specific Ag, Serum: 0.1 ng/mL (ref 0.0–4.0)

## 2022-03-13 ENCOUNTER — Encounter: Payer: Self-pay | Admitting: Hematology and Oncology

## 2022-03-14 ENCOUNTER — Telehealth: Payer: Self-pay | Admitting: Hematology and Oncology

## 2022-03-14 NOTE — Telephone Encounter (Signed)
Called patient per 2/25 IB message to schedule f/u. Patient scheduled and notified.

## 2022-03-21 ENCOUNTER — Other Ambulatory Visit: Payer: Self-pay

## 2022-03-23 DIAGNOSIS — Z20822 Contact with and (suspected) exposure to covid-19: Secondary | ICD-10-CM | POA: Diagnosis not present

## 2022-03-23 DIAGNOSIS — J069 Acute upper respiratory infection, unspecified: Secondary | ICD-10-CM | POA: Diagnosis not present

## 2022-03-24 ENCOUNTER — Other Ambulatory Visit (HOSPITAL_COMMUNITY): Payer: Self-pay

## 2022-04-06 DIAGNOSIS — D225 Melanocytic nevi of trunk: Secondary | ICD-10-CM | POA: Diagnosis not present

## 2022-04-06 DIAGNOSIS — D2261 Melanocytic nevi of right upper limb, including shoulder: Secondary | ICD-10-CM | POA: Diagnosis not present

## 2022-04-06 DIAGNOSIS — Z85828 Personal history of other malignant neoplasm of skin: Secondary | ICD-10-CM | POA: Diagnosis not present

## 2022-04-06 DIAGNOSIS — L570 Actinic keratosis: Secondary | ICD-10-CM | POA: Diagnosis not present

## 2022-04-12 ENCOUNTER — Other Ambulatory Visit: Payer: Self-pay | Admitting: Hematology and Oncology

## 2022-04-19 ENCOUNTER — Other Ambulatory Visit (HOSPITAL_COMMUNITY): Payer: Self-pay

## 2022-04-22 ENCOUNTER — Other Ambulatory Visit (HOSPITAL_COMMUNITY): Payer: Self-pay

## 2022-05-18 ENCOUNTER — Other Ambulatory Visit (HOSPITAL_COMMUNITY): Payer: Self-pay

## 2022-05-20 ENCOUNTER — Other Ambulatory Visit (HOSPITAL_COMMUNITY): Payer: Self-pay

## 2022-06-14 ENCOUNTER — Other Ambulatory Visit: Payer: Self-pay

## 2022-06-14 ENCOUNTER — Other Ambulatory Visit: Payer: Self-pay | Admitting: *Deleted

## 2022-06-14 DIAGNOSIS — C61 Malignant neoplasm of prostate: Secondary | ICD-10-CM

## 2022-06-15 ENCOUNTER — Other Ambulatory Visit: Payer: Self-pay

## 2022-06-15 ENCOUNTER — Inpatient Hospital Stay: Payer: Medicare Other | Attending: Hematology and Oncology

## 2022-06-15 DIAGNOSIS — Z79818 Long term (current) use of other agents affecting estrogen receptors and estrogen levels: Secondary | ICD-10-CM | POA: Insufficient documentation

## 2022-06-15 DIAGNOSIS — C7951 Secondary malignant neoplasm of bone: Secondary | ICD-10-CM | POA: Diagnosis not present

## 2022-06-15 DIAGNOSIS — C61 Malignant neoplasm of prostate: Secondary | ICD-10-CM | POA: Insufficient documentation

## 2022-06-15 LAB — CBC WITH DIFFERENTIAL (CANCER CENTER ONLY)
Abs Immature Granulocytes: 0.03 10*3/uL (ref 0.00–0.07)
Basophils Absolute: 0.1 10*3/uL (ref 0.0–0.1)
Basophils Relative: 1 %
Eosinophils Absolute: 0.3 10*3/uL (ref 0.0–0.5)
Eosinophils Relative: 3 %
HCT: 42.9 % (ref 39.0–52.0)
Hemoglobin: 15.3 g/dL (ref 13.0–17.0)
Immature Granulocytes: 0 %
Lymphocytes Relative: 16 %
Lymphs Abs: 1.4 10*3/uL (ref 0.7–4.0)
MCH: 32.3 pg (ref 26.0–34.0)
MCHC: 35.7 g/dL (ref 30.0–36.0)
MCV: 90.7 fL (ref 80.0–100.0)
Monocytes Absolute: 0.8 10*3/uL (ref 0.1–1.0)
Monocytes Relative: 8 %
Neutro Abs: 6.4 10*3/uL (ref 1.7–7.7)
Neutrophils Relative %: 72 %
Platelet Count: 190 10*3/uL (ref 150–400)
RBC: 4.73 MIL/uL (ref 4.22–5.81)
RDW: 12.5 % (ref 11.5–15.5)
WBC Count: 8.9 10*3/uL (ref 4.0–10.5)
nRBC: 0 % (ref 0.0–0.2)

## 2022-06-15 LAB — CMP (CANCER CENTER ONLY)
ALT: 19 U/L (ref 0–44)
AST: 17 U/L (ref 15–41)
Albumin: 4.1 g/dL (ref 3.5–5.0)
Alkaline Phosphatase: 79 U/L (ref 38–126)
Anion gap: 6 (ref 5–15)
BUN: 17 mg/dL (ref 8–23)
CO2: 28 mmol/L (ref 22–32)
Calcium: 9 mg/dL (ref 8.9–10.3)
Chloride: 110 mmol/L (ref 98–111)
Creatinine: 0.98 mg/dL (ref 0.61–1.24)
GFR, Estimated: >60 mL/min (ref >60)
Glucose, Bld: 125 mg/dL — ABNORMAL HIGH (ref 70–99)
Potassium: 3.5 mmol/L (ref 3.5–5.1)
Sodium: 144 mmol/L (ref 135–145)
Total Bilirubin: 0.7 mg/dL (ref 0.3–1.2)
Total Protein: 6.3 g/dL — ABNORMAL LOW (ref 6.5–8.1)

## 2022-06-16 LAB — PROSTATE-SPECIFIC AG, SERUM (LABCORP): Prostate Specific Ag, Serum: 0.1 ng/mL (ref 0.0–4.0)

## 2022-06-17 ENCOUNTER — Other Ambulatory Visit (HOSPITAL_COMMUNITY): Payer: Self-pay

## 2022-07-06 DIAGNOSIS — F3341 Major depressive disorder, recurrent, in partial remission: Secondary | ICD-10-CM | POA: Diagnosis not present

## 2022-07-06 DIAGNOSIS — Z8546 Personal history of malignant neoplasm of prostate: Secondary | ICD-10-CM | POA: Diagnosis not present

## 2022-07-13 ENCOUNTER — Other Ambulatory Visit (HOSPITAL_COMMUNITY): Payer: Self-pay

## 2022-07-18 ENCOUNTER — Other Ambulatory Visit (HOSPITAL_COMMUNITY): Payer: Self-pay

## 2022-07-29 ENCOUNTER — Other Ambulatory Visit: Payer: Self-pay

## 2022-07-29 MED ORDER — CLOPIDOGREL BISULFATE 75 MG PO TABS: 75.0000 mg | ORAL_TABLET | Freq: Every day | ORAL | 1 refills | Status: AC

## 2022-07-29 NOTE — Progress Notes (Signed)
Pt called requesting Plavix refill. He has been refilled for six months, which takes him to the time of his f/u at the end of this year. Pt is aware of the above and has no questions/concerns at this time.

## 2022-08-08 ENCOUNTER — Other Ambulatory Visit (HOSPITAL_COMMUNITY): Payer: Self-pay

## 2022-08-12 ENCOUNTER — Other Ambulatory Visit: Payer: Self-pay

## 2022-08-28 ENCOUNTER — Other Ambulatory Visit: Payer: Self-pay | Admitting: Hematology and Oncology

## 2022-08-28 DIAGNOSIS — C61 Malignant neoplasm of prostate: Secondary | ICD-10-CM

## 2022-09-03 ENCOUNTER — Other Ambulatory Visit: Payer: Self-pay | Admitting: Hematology and Oncology

## 2022-09-04 ENCOUNTER — Encounter: Payer: Self-pay | Admitting: Hematology and Oncology

## 2022-09-09 ENCOUNTER — Other Ambulatory Visit (HOSPITAL_COMMUNITY): Payer: Self-pay

## 2022-09-09 ENCOUNTER — Other Ambulatory Visit: Payer: Self-pay | Admitting: Oncology

## 2022-09-09 ENCOUNTER — Other Ambulatory Visit: Payer: Self-pay | Admitting: Hematology and Oncology

## 2022-09-09 ENCOUNTER — Other Ambulatory Visit: Payer: Self-pay

## 2022-09-09 DIAGNOSIS — C61 Malignant neoplasm of prostate: Secondary | ICD-10-CM

## 2022-09-09 MED ORDER — ABIRATERONE ACETATE 250 MG PO TABS
1000.0000 mg | ORAL_TABLET | Freq: Every day | ORAL | 9 refills | Status: DC
  Filled 2022-09-09: qty 120, 30d supply, fill #0
  Filled 2022-10-04: qty 120, 30d supply, fill #1
  Filled 2022-11-01: qty 120, 30d supply, fill #2
  Filled 2022-11-29: qty 120, 30d supply, fill #3
  Filled 2022-12-29: qty 120, 30d supply, fill #4
  Filled 2023-01-25: qty 120, 30d supply, fill #5
  Filled 2023-02-24: qty 120, 30d supply, fill #6
  Filled 2023-03-22: qty 120, 30d supply, fill #7
  Filled 2023-04-20: qty 120, 30d supply, fill #8
  Filled 2023-05-19: qty 120, 30d supply, fill #9

## 2022-09-12 ENCOUNTER — Other Ambulatory Visit (HOSPITAL_COMMUNITY): Payer: Self-pay

## 2022-09-13 ENCOUNTER — Other Ambulatory Visit: Payer: Self-pay | Admitting: Hematology and Oncology

## 2022-09-13 DIAGNOSIS — C61 Malignant neoplasm of prostate: Secondary | ICD-10-CM

## 2022-09-13 NOTE — Progress Notes (Unsigned)
[facility] [phone]   [phone]  PROGRESS NOTE  Patient Care Team: Clovis Riley, L.August Saucer, MD as PCP - General (Family Medicine)  Hematological/Oncological History # Castration-Resistant Advanced Prostate Cancer  # Metastatic Spread to Bones  02/04/2017: Prostate biopsy showed a Gleason score of 4+4 = 8.  01/31/2018-04/2018: 5 cycles of Taxotere 75mg /m2  11/30/2021: last visit with Dr. Clelia Croft 03/03/2022: transfer care to Dr. Leonides Schanz   Interval History:  Dennis Patel 72 y.o. male with medical history significant for Castration-Resistant Advanced Prostate Cancer  presents for a follow up visit. The patient's last visit was on 11/30/2021 with Dr. Clelia Croft. In the interim since the last visit he is continued on Eligard and [place].  On exam today Dennis Patel reports***.  He otherwise denies any fevers, chills, sweats, nausea, vomiting or diarrhea.  A full 10 point ROS is otherwise negative.  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    SURGICAL HISTORY: Past Surgical History:  Procedure Laterality Date   ABDOMINAL ANGIOGRAM N/A 03/17/2011   Procedure: ABDOMINAL ANGIOGRAM;  Surgeon: Corky Crafts, MD;  Location: [facility] CATH LAB;  Service: Cardiovascular;  Laterality: N/A;   ABDOMINAL AORTOGRAM W/LOWER EXTREMITY N/A 03/17/2017   Procedure: ABDOMINAL AORTOGRAM W/LOWER EXTREMITY;  Surgeon: Chuck Hint, MD;  Location: [facility] INVASIVE CV LAB;  Service: Cardiovascular;   Laterality: N/A;   ABDOMINAL AORTOGRAM W/LOWER EXTREMITY N/A 09/08/2017   Procedure: ABDOMINAL AORTOGRAM W/LOWER EXTREMITY;  Surgeon: Chuck Hint, MD;  Location: [facility] INVASIVE CV LAB;  Service: Cardiovascular;  Laterality: N/A;   ILIAC ARTERY STENT     IR FLUORO GUIDE CV LINE RIGHT  12/07/2021   IR IMAGING GUIDED PORT INSERTION  01/30/2018   IR REMOVAL TUN ACCESS W/ PORT W/O FL MOD SED  12/07/2021   LOWER EXTREMITY ANGIOGRAM N/A 03/17/2011   Procedure: LOWER EXTREMITY ANGIOGRAM;  Surgeon: Corky Crafts, MD;  Location: [facility] CATH LAB;  Service: Cardiovascular;  Laterality: N/A;   PERIPHERAL VASCULAR INTERVENTION Left 09/08/2017   Procedure: PERIPHERAL VASCULAR INTERVENTION;  Surgeon: Chuck Hint, MD;  Location: [facility] INVASIVE CV LAB;  Service: Cardiovascular;  Laterality: Left;  Common iliac    SOCIAL HISTORY: Social History   Socioeconomic History   Marital status: Married    Spouse name: Not on file   Number of children: Not on file   Years of education: Not on file   Highest education level: Not on file  Occupational History   Not on file  Tobacco Use   Smoking status: Former    Current packs/day: 0.00    Types: Cigarettes    Quit date: 2005    Years since quitting: 19.6   Smokeless tobacco: Never  Vaping Use   Vaping status: Never Used  Substance and Sexual Activity   Alcohol use: Never   Drug use: Never   Sexual activity: Not on file  Other Topics Concern  Not on file  Social History Narrative   ** Merged History Encounter **       ** Merged History Encounter **       Social Determinants of Health   Financial Resource Strain: Not on file  Food Insecurity: Not on file  Transportation Needs: Not on file  Physical Activity: Not on file  Stress: Not on file  Social Connections: Not on file  Intimate Partner Violence: Not on file    FAMILY HISTORY: Family History  Problem Relation Age of Onset   Ovarian cancer Mother 62   Diabetes Mother     CAD Father    Diabetes Sister    Lymphoma Brother 45   Skin cancer Brother        basal and squamous cell   Ovarian cancer Maternal Grandmother    CAD Son    Diabetes Son    Heart disease Son    Ovarian cancer Maternal Great-grandmother     ALLERGIES:  has No Known Allergies.  MEDICATIONS:  Current Outpatient Medications  Medication Sig Dispense Refill   abiraterone acetate (ZYTIGA) 250 MG tablet Take 4 tablets (1,000 mg total) by mouth daily. Take on an empty stomach 1 hour before or 2 hours after a meal 120 tablet 9   acetaminophen (TYLENOL) 325 MG tablet Take 650 mg by mouth every 6 (six) hours as needed.     amLODipine (NORVASC) 10 MG tablet Take 1 tablet (10 mg total) by mouth daily. 30 tablet 0   aspirin EC 81 MG tablet Take 81 mg by mouth daily.     atorvastatin (LIPITOR) 10 MG tablet TAKE 1 TABLET BY MOUTH EVERY DAY 90 tablet 3   calcium-vitamin D (OSCAL WITH D) 250-125 MG-UNIT tablet Take 1 tablet by mouth daily.     clopidogrel (PLAVIX) 75 MG tablet Take 1 tablet (75 mg total) by mouth daily. 90 tablet 1   gabapentin (NEURONTIN) 300 MG capsule TAKE 1 CAPSULE BY MOUTH THREE TIMES A DAY 90 capsule 3   hydrochlorothiazide (HYDRODIURIL) 12.5 MG tablet Take 12.5 mg by mouth every morning.     Multiple Vitamins-Minerals (ONE DAILY MULTIVITAMIN MEN PO) Take 1 tablet by mouth daily.     predniSONE (DELTASONE) 5 MG tablet TAKE 1 TABLET BY MOUTH EVERY DAY WITH BREAKFAST 90 tablet 3   traZODone (DESYREL) 50 MG tablet TAKE 1 TABLET (50 MG TOTAL) BY MOUTH AT BEDTIME AS NEEDED. FOR SLEEP 90 tablet 1   No current facility-administered medications for this visit.    REVIEW OF SYSTEMS:   Constitutional: ( - ) fevers, ( - )  chills , ( - ) night sweats Eyes: ( - ) blurriness of vision, ( - ) double vision, ( - ) watery eyes Ears, nose, mouth, throat, and face: ( - ) mucositis, ( - ) sore throat Respiratory: ( - ) cough, ( - ) dyspnea, ( - ) wheezes Cardiovascular: ( - ) palpitation, ( -  ) chest discomfort, ( - ) lower extremity swelling Gastrointestinal:  ( - ) nausea, ( - ) heartburn, ( - ) change in bowel habits Skin: ( - ) abnormal skin rashes Lymphatics: ( - ) new lymphadenopathy, ( - ) easy bruising Neurological: ( - ) numbness, ( - ) tingling, ( - ) new weaknesses Behavioral/Psych: ( - ) mood change, ( - ) new changes  All other systems were reviewed with the patient and are negative.  PHYSICAL EXAMINATION: ECOG PERFORMANCE STATUS: 1 - Symptomatic but completely  ambulatory  There were no vitals filed for this visit.  There were no vitals filed for this visit.   GENERAL: Well-appearing elderly Caucasian male, alert, no distress and comfortable SKIN: skin color, texture, turgor are normal, no rashes or significant lesions EYES: conjunctiva are pink and non-injected, sclera clear LUNGS: clear to auscultation and percussion with normal breathing effort HEART: regular rate & rhythm and no murmurs and no lower extremity edema Musculoskeletal: no cyanosis of digits and no clubbing  PSYCH: alert & oriented x 3, fluent speech NEURO: no focal motor/sensory deficits  LABORATORY DATA:  I have reviewed the data as listed    Latest Ref Rng & Units 06/15/2022    7:30 AM 03/03/2022    9:38 AM 11/23/2021    3:24 PM  CBC  WBC 4.0 - 10.5 K/uL 8.9  10.4  11.6   Hemoglobin 13.0 - 17.0 g/dL 40.9  81.1  91.4   Hematocrit 39.0 - 52.0 % 42.9  44.7  41.2   Platelets 150 - 400 K/uL 190  209  211        Latest Ref Rng & Units 06/15/2022    7:30 AM 03/03/2022    9:38 AM 11/23/2021    3:24 PM  CMP  Glucose 70 - 99 mg/dL 782  956  98   BUN 8 - 23 mg/dL 17  18  17    Creatinine 0.61 - 1.24 mg/dL 2.13  0.86  5.78   Sodium 135 - 145 mmol/L 144  141  141   Potassium 3.5 - 5.1 mmol/L 3.5  3.6  4.4   Chloride 98 - 111 mmol/L 110  105  106   CO2 22 - 32 mmol/L 28  25  28    Calcium 8.9 - 10.3 mg/dL 9.0  9.3  9.5   Total Protein 6.5 - 8.1 g/dL 6.3  7.0  6.4   Total Bilirubin 0.3 - 1.2  mg/dL 0.7  0.9  0.6   Alkaline Phos 38 - 126 U/L 79  65  81   AST 15 - 41 U/L 17  20  14    ALT 0 - 44 U/L 19  21  17     RADIOGRAPHIC STUDIES: No results found.  ASSESSMENT & PLAN Dennis Patel 72 y.o. male with medical history significant for Castration-Resistant Advanced Prostate Cancer  presents for a follow up visit.  # Castration-Resistant Advanced Prostate Cancer  # Metastatic Spread to Bones  -- currently on Eligard 45mg  q 6 months, last dose on 08/2021 -- continue Zytiga 1000 mg daily with prednisone 5 mg daily, started in May 2020  -- Labs today show white blood cell count *** with normal LFTs. --PSA at 0.1 on *** -- Plan to have patient return to clinic in 3 months time for labs and clinic visit and in 6 months for repeat injection.  No orders of the defined types were placed in this encounter.   All questions were answered. The patient knows to call the clinic with any problems, questions or concerns.  A total of more than 30 minutes were spent on this encounter with face-to-face time and non-face-to-face time, including preparing to see the patient, ordering tests and/or medications, counseling the patient and coordination of care as outlined above.   Ulysees Barns, MD Department of Hematology/Oncology [facility] Phone: [phone] Pager: [phone] Email: [email or web address]  09/13/2022 3:49 PM

## 2022-09-14 ENCOUNTER — Inpatient Hospital Stay: Payer: Medicare Other | Attending: Hematology and Oncology

## 2022-09-14 ENCOUNTER — Inpatient Hospital Stay: Payer: Medicare Other

## 2022-09-14 ENCOUNTER — Inpatient Hospital Stay: Payer: Medicare Other | Admitting: Hematology and Oncology

## 2022-09-14 VITALS — BP 146/63 | HR 56 | Temp 97.5°F | Resp 16 | Wt 181.0 lb

## 2022-09-14 DIAGNOSIS — M25571 Pain in right ankle and joints of right foot: Secondary | ICD-10-CM | POA: Insufficient documentation

## 2022-09-14 DIAGNOSIS — Z8041 Family history of malignant neoplasm of ovary: Secondary | ICD-10-CM | POA: Diagnosis not present

## 2022-09-14 DIAGNOSIS — Z808 Family history of malignant neoplasm of other organs or systems: Secondary | ICD-10-CM | POA: Diagnosis not present

## 2022-09-14 DIAGNOSIS — Z87891 Personal history of nicotine dependence: Secondary | ICD-10-CM | POA: Diagnosis not present

## 2022-09-14 DIAGNOSIS — C61 Malignant neoplasm of prostate: Secondary | ICD-10-CM | POA: Diagnosis not present

## 2022-09-14 DIAGNOSIS — Z807 Family history of other malignant neoplasms of lymphoid, hematopoietic and related tissues: Secondary | ICD-10-CM | POA: Insufficient documentation

## 2022-09-14 DIAGNOSIS — C7951 Secondary malignant neoplasm of bone: Secondary | ICD-10-CM | POA: Insufficient documentation

## 2022-09-14 DIAGNOSIS — Z5111 Encounter for antineoplastic chemotherapy: Secondary | ICD-10-CM | POA: Diagnosis not present

## 2022-09-14 DIAGNOSIS — G8929 Other chronic pain: Secondary | ICD-10-CM

## 2022-09-14 DIAGNOSIS — Z95828 Presence of other vascular implants and grafts: Secondary | ICD-10-CM

## 2022-09-14 LAB — CBC WITH DIFFERENTIAL (CANCER CENTER ONLY)
Abs Immature Granulocytes: 0.06 10*3/uL (ref 0.00–0.07)
Basophils Absolute: 0 10*3/uL (ref 0.0–0.1)
Basophils Relative: 0 %
Eosinophils Absolute: 0.2 10*3/uL (ref 0.0–0.5)
Eosinophils Relative: 2 %
HCT: 43.6 % (ref 39.0–52.0)
Hemoglobin: 15.4 g/dL (ref 13.0–17.0)
Immature Granulocytes: 1 %
Lymphocytes Relative: 14 %
Lymphs Abs: 1.5 10*3/uL (ref 0.7–4.0)
MCH: 32.8 pg (ref 26.0–34.0)
MCHC: 35.3 g/dL (ref 30.0–36.0)
MCV: 93 fL (ref 80.0–100.0)
Monocytes Absolute: 1 10*3/uL (ref 0.1–1.0)
Monocytes Relative: 9 %
Neutro Abs: 8 10*3/uL — ABNORMAL HIGH (ref 1.7–7.7)
Neutrophils Relative %: 74 %
Platelet Count: 194 10*3/uL (ref 150–400)
RBC: 4.69 MIL/uL (ref 4.22–5.81)
RDW: 12.3 % (ref 11.5–15.5)
WBC Count: 10.8 10*3/uL — ABNORMAL HIGH (ref 4.0–10.5)
nRBC: 0 % (ref 0.0–0.2)

## 2022-09-14 LAB — CMP (CANCER CENTER ONLY)
ALT: 22 U/L (ref 0–44)
AST: 18 U/L (ref 15–41)
Albumin: 4.2 g/dL (ref 3.5–5.0)
Alkaline Phosphatase: 78 U/L (ref 38–126)
Anion gap: 7 (ref 5–15)
BUN: 20 mg/dL (ref 8–23)
CO2: 28 mmol/L (ref 22–32)
Calcium: 9.3 mg/dL (ref 8.9–10.3)
Chloride: 108 mmol/L (ref 98–111)
Creatinine: 0.98 mg/dL (ref 0.61–1.24)
GFR, Estimated: >60 mL/min (ref >60)
Glucose, Bld: 120 mg/dL — ABNORMAL HIGH (ref 70–99)
Potassium: 3.6 mmol/L (ref 3.5–5.1)
Sodium: 143 mmol/L (ref 135–145)
Total Bilirubin: 0.7 mg/dL (ref 0.3–1.2)
Total Protein: 6.4 g/dL — ABNORMAL LOW (ref 6.5–8.1)

## 2022-09-14 MED ORDER — LEUPROLIDE ACETATE (6 MONTH) 45 MG ~~LOC~~ KIT
45.0000 mg | PACK | Freq: Once | SUBCUTANEOUS | Status: AC
  Administered 2022-09-14: 45 mg via SUBCUTANEOUS
  Filled 2022-09-14: qty 45

## 2022-09-15 DIAGNOSIS — B070 Plantar wart: Secondary | ICD-10-CM | POA: Diagnosis not present

## 2022-09-15 LAB — PROSTATE-SPECIFIC AG, SERUM (LABCORP): Prostate Specific Ag, Serum: 0.2 ng/mL (ref 0.0–4.0)

## 2022-09-15 LAB — TESTOSTERONE: Testosterone: <3 ng/dL — ABNORMAL LOW (ref 264–916)

## 2022-09-16 ENCOUNTER — Telehealth: Payer: Self-pay | Admitting: Hematology and Oncology

## 2022-10-03 DIAGNOSIS — B070 Plantar wart: Secondary | ICD-10-CM | POA: Diagnosis not present

## 2022-10-03 DIAGNOSIS — Z23 Encounter for immunization: Secondary | ICD-10-CM | POA: Diagnosis not present

## 2022-10-04 ENCOUNTER — Other Ambulatory Visit (HOSPITAL_COMMUNITY): Payer: Self-pay

## 2022-10-06 ENCOUNTER — Other Ambulatory Visit: Payer: Self-pay | Admitting: Hematology and Oncology

## 2022-10-07 ENCOUNTER — Encounter: Payer: Self-pay | Admitting: Hematology and Oncology

## 2022-10-10 DIAGNOSIS — D0439 Carcinoma in situ of skin of other parts of face: Secondary | ICD-10-CM | POA: Diagnosis not present

## 2022-10-10 DIAGNOSIS — L821 Other seborrheic keratosis: Secondary | ICD-10-CM | POA: Diagnosis not present

## 2022-10-10 DIAGNOSIS — Z85820 Personal history of malignant melanoma of skin: Secondary | ICD-10-CM | POA: Diagnosis not present

## 2022-10-10 DIAGNOSIS — Z85828 Personal history of other malignant neoplasm of skin: Secondary | ICD-10-CM | POA: Diagnosis not present

## 2022-10-10 DIAGNOSIS — L905 Scar conditions and fibrosis of skin: Secondary | ICD-10-CM | POA: Diagnosis not present

## 2022-10-10 DIAGNOSIS — D044 Carcinoma in situ of skin of scalp and neck: Secondary | ICD-10-CM | POA: Diagnosis not present

## 2022-10-10 DIAGNOSIS — L570 Actinic keratosis: Secondary | ICD-10-CM | POA: Diagnosis not present

## 2022-10-22 ENCOUNTER — Other Ambulatory Visit: Payer: Self-pay | Admitting: Hematology and Oncology

## 2022-10-22 DIAGNOSIS — C61 Malignant neoplasm of prostate: Secondary | ICD-10-CM

## 2022-10-23 ENCOUNTER — Encounter: Payer: Self-pay | Admitting: Hematology and Oncology

## 2022-11-01 ENCOUNTER — Other Ambulatory Visit (HOSPITAL_COMMUNITY): Payer: Self-pay | Admitting: Pharmacy Technician

## 2022-11-01 ENCOUNTER — Other Ambulatory Visit: Payer: Self-pay

## 2022-11-01 ENCOUNTER — Other Ambulatory Visit (HOSPITAL_COMMUNITY): Payer: Self-pay

## 2022-11-01 NOTE — Progress Notes (Signed)
Specialty Pharmacy Refill Coordination Note  Dennis Patel is a 72 y.o. male contacted today regarding refills of specialty medication(s) Abiraterone Acetate   Patient requested Delivery   Delivery date: 11/08/22   Verified address: [address] [place]   Medication will be filled on 11/07/22.

## 2022-11-07 ENCOUNTER — Other Ambulatory Visit: Payer: Self-pay

## 2022-11-07 ENCOUNTER — Encounter: Payer: Self-pay | Admitting: Hematology and Oncology

## 2022-11-07 ENCOUNTER — Telehealth: Payer: Self-pay | Admitting: Pharmacy Technician

## 2022-11-07 ENCOUNTER — Other Ambulatory Visit (HOSPITAL_COMMUNITY): Payer: Self-pay

## 2022-11-07 NOTE — Telephone Encounter (Signed)
Oral Oncology Patient Advocate Encounter  Was successful in securing patient a $8,000 grant from [facility] to provide copayment coverage for abiraterone.  This will keep the out of pocket expense at $0.     [employer] ID: [id number]  I have spoken with the patient.   The billing information is as follows and has been shared with [facility].    RxBin: [id number] PCN: [id number] Member ID: [id number] Group ID: [id number] Dates of Eligibility: 10/20/22 through 10/19/23  Fund:  Prostate  Jinger Neighbors, CPhT-Adv Oncology Pharmacy Patient Advocate [facility] Direct Number: [phone]  Fax: [phone]

## 2022-11-29 ENCOUNTER — Other Ambulatory Visit: Payer: Self-pay

## 2022-11-29 ENCOUNTER — Other Ambulatory Visit (HOSPITAL_COMMUNITY): Payer: Self-pay

## 2022-11-29 NOTE — Progress Notes (Signed)
Specialty Pharmacy Refill Coordination Note  LILIANA BIBY is a 72 y.o. male contacted today regarding refills of specialty medication(s) Abiraterone Acetate   Patient requested Delivery   Delivery date: 12/09/22   Verified address: [address] [place]   Medication will be filled on 12/08/22.

## 2022-11-30 ENCOUNTER — Inpatient Hospital Stay: Payer: Medicare Other | Attending: Hematology and Oncology

## 2022-11-30 ENCOUNTER — Inpatient Hospital Stay: Payer: Medicare Other | Admitting: Hematology and Oncology

## 2022-11-30 VITALS — BP 137/62 | HR 64 | Temp 97.8°F | Resp 13 | Wt 187.1 lb

## 2022-11-30 DIAGNOSIS — C61 Malignant neoplasm of prostate: Secondary | ICD-10-CM | POA: Insufficient documentation

## 2022-11-30 DIAGNOSIS — M25571 Pain in right ankle and joints of right foot: Secondary | ICD-10-CM | POA: Diagnosis not present

## 2022-11-30 DIAGNOSIS — C7951 Secondary malignant neoplasm of bone: Secondary | ICD-10-CM | POA: Insufficient documentation

## 2022-11-30 DIAGNOSIS — Z95828 Presence of other vascular implants and grafts: Secondary | ICD-10-CM

## 2022-11-30 DIAGNOSIS — Z79899 Other long term (current) drug therapy: Secondary | ICD-10-CM | POA: Insufficient documentation

## 2022-11-30 DIAGNOSIS — Z87891 Personal history of nicotine dependence: Secondary | ICD-10-CM | POA: Insufficient documentation

## 2022-11-30 DIAGNOSIS — G8929 Other chronic pain: Secondary | ICD-10-CM | POA: Diagnosis not present

## 2022-11-30 LAB — CMP (CANCER CENTER ONLY)
ALT: 22 U/L (ref 0–44)
AST: 17 U/L (ref 15–41)
Albumin: 4.2 g/dL (ref 3.5–5.0)
Alkaline Phosphatase: 74 U/L (ref 38–126)
Anion gap: 6 (ref 5–15)
BUN: 17 mg/dL (ref 8–23)
CO2: 30 mmol/L (ref 22–32)
Calcium: 9.7 mg/dL (ref 8.9–10.3)
Chloride: 105 mmol/L (ref 98–111)
Creatinine: 1 mg/dL (ref 0.61–1.24)
GFR, Estimated: >60 mL/min (ref >60)
Glucose, Bld: 173 mg/dL — ABNORMAL HIGH (ref 70–99)
Potassium: 3.9 mmol/L (ref 3.5–5.1)
Sodium: 141 mmol/L (ref 135–145)
Total Bilirubin: 0.8 mg/dL (ref <1.2)
Total Protein: 6.5 g/dL (ref 6.5–8.1)

## 2022-11-30 LAB — CBC WITH DIFFERENTIAL (CANCER CENTER ONLY)
Abs Immature Granulocytes: 0.04 10*3/uL (ref 0.00–0.07)
Basophils Absolute: 0 10*3/uL (ref 0.0–0.1)
Basophils Relative: 0 %
Eosinophils Absolute: 0.1 10*3/uL (ref 0.0–0.5)
Eosinophils Relative: 1 %
HCT: 43.4 % (ref 39.0–52.0)
Hemoglobin: 15.5 g/dL (ref 13.0–17.0)
Immature Granulocytes: 0 %
Lymphocytes Relative: 10 %
Lymphs Abs: 1.1 10*3/uL (ref 0.7–4.0)
MCH: 33 pg (ref 26.0–34.0)
MCHC: 35.7 g/dL (ref 30.0–36.0)
MCV: 92.5 fL (ref 80.0–100.0)
Monocytes Absolute: 0.6 10*3/uL (ref 0.1–1.0)
Monocytes Relative: 6 %
Neutro Abs: 9.3 10*3/uL — ABNORMAL HIGH (ref 1.7–7.7)
Neutrophils Relative %: 83 %
Platelet Count: 195 10*3/uL (ref 150–400)
RBC: 4.69 MIL/uL (ref 4.22–5.81)
RDW: 12.3 % (ref 11.5–15.5)
WBC Count: 11.1 10*3/uL — ABNORMAL HIGH (ref 4.0–10.5)
nRBC: 0 % (ref 0.0–0.2)

## 2022-11-30 NOTE — Progress Notes (Signed)
[facility] [phone]   Fax:([phone]  PROGRESS NOTE  Patient Care Team: Clovis Riley, L.August Saucer, MD (Inactive) as PCP - General (Family Medicine)  Hematological/Oncological History # Castration-Resistant Advanced Prostate Cancer  # Metastatic Spread to Bones  02/04/2017: Prostate biopsy showed a Gleason score of 4+4 = 8.  01/31/2018-04/2018: 5 cycles of Taxotere 75mg /m2  11/30/2021: last visit with Dr. Clelia Croft 03/03/2022: transfer care to Dr. Leonides Schanz   Interval History:  Dennis Patel 72 y.o. male with medical history significant for Castration-Resistant Advanced Prostate Cancer  presents for a follow up visit. The patient's last visit was on 09/14/2022. In the interim since the last visit he is continued on Eligard and [place].  On exam today Dennis Patel reports things have been "same old same old".  He notes that he has not had any hospitalizations, ER visits, or new medications.  He denies any runny nose, sore throat, or cough.  He did recently get his flu shot but not as RSV or COVID booster.  He reports he is taking his Zytiga and prednisone faithfully.  It does cause some dry mouth and occasional hot flashes and sweats.  He reports his energy levels are fine.  He continues to have difficulty with the pain in his right ankle which he thinks may have been worsening with the recent weather.  He reports that he is not having any other bone pain or back pain.  He does urinate approximately 1-2 3 times per night and urinates a lot because he has a "enlarged bladder".  Overall he feels well and has no questions concerns or complaints.  He reports his weight is fluctuating as he was trying to lose weight but is now in the process of gaining it back.  Overall he is willing and able to continue Lupron shots and Zytiga at this time.  He otherwise denies any fevers, chills, sweats, nausea, vomiting or diarrhea.  A full 10 point ROS is otherwise negative.  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    SURGICAL HISTORY: Past Surgical History:  Procedure Laterality Date   ABDOMINAL ANGIOGRAM N/A 03/17/2011   Procedure: ABDOMINAL ANGIOGRAM;  Surgeon: Corky Crafts, MD;  Location: [facility] CATH LAB;  Service: Cardiovascular;  Laterality: N/A;   ABDOMINAL AORTOGRAM W/LOWER EXTREMITY N/A 03/17/2017   Procedure: ABDOMINAL AORTOGRAM W/LOWER EXTREMITY;  Surgeon: Chuck Hint, MD;  Location: [facility] INVASIVE CV LAB;  Service: Cardiovascular;  Laterality: N/A;   ABDOMINAL AORTOGRAM W/LOWER EXTREMITY N/A 09/08/2017   Procedure: ABDOMINAL AORTOGRAM W/LOWER EXTREMITY;  Surgeon: Chuck Hint, MD;  Location: [facility] INVASIVE CV LAB;  Service: Cardiovascular;  Laterality: N/A;   ILIAC ARTERY STENT     IR FLUORO GUIDE CV LINE RIGHT  12/07/2021   IR IMAGING GUIDED PORT INSERTION  01/30/2018   IR REMOVAL TUN ACCESS W/ PORT W/O FL MOD SED  12/07/2021   LOWER EXTREMITY ANGIOGRAM  N/A 03/17/2011   Procedure: LOWER EXTREMITY ANGIOGRAM;  Surgeon: Corky Crafts, MD;  Location: [facility] CATH LAB;  Service: Cardiovascular;  Laterality: N/A;   PERIPHERAL VASCULAR INTERVENTION Left 09/08/2017   Procedure: PERIPHERAL VASCULAR INTERVENTION;  Surgeon: Chuck Hint, MD;  Location: [facility] INVASIVE CV LAB;  Service: Cardiovascular;  Laterality: Left;  Common iliac    SOCIAL HISTORY: Social History   Socioeconomic History   Marital status: Married    Spouse name: Not on file   Number of children: Not on  file   Years of education: Not on file   Highest education level: Not on file  Occupational History   Not on file  Tobacco Use   Smoking status: Former    Current packs/day: 0.00    Types: Cigarettes    Quit date: 2005    Years since quitting: 19.8   Smokeless tobacco: Never  Vaping Use   Vaping status: Never Used  Substance and Sexual Activity   Alcohol use: Never   Drug use: Never   Sexual activity: Not on file  Other Topics Concern   Not on file  Social History Narrative   ** Merged History Encounter **       ** Merged History Encounter **       Social Determinants of Health   Financial Resource Strain: Not on file  Food Insecurity: Not on file  Transportation Needs: Not on file  Physical Activity: Not on file  Stress: Not on file  Social Connections: Not on file  Intimate Partner Violence: Not on file    FAMILY HISTORY: Family History  Problem Relation Age of Onset   Ovarian cancer Mother 47   Diabetes Mother    CAD Father    Diabetes Sister    Lymphoma Brother 63   Skin cancer Brother        basal and squamous cell   Ovarian cancer Maternal Grandmother    CAD Son    Diabetes Son    Heart disease Son    Ovarian cancer Maternal Great-grandmother     ALLERGIES:  has No Known Allergies.  MEDICATIONS:  Current Outpatient Medications  Medication Sig Dispense Refill   abiraterone acetate (ZYTIGA) 250 MG tablet Take 4 tablets (1,000 mg total) by mouth daily. Take on an empty stomach 1 hour before or 2 hours after a meal 120 tablet 9   acetaminophen (TYLENOL) 325 MG tablet Take 650 mg by mouth every 6 (six) hours as needed.     amLODipine (NORVASC) 10 MG tablet Take 1 tablet (10 mg total) by mouth daily. 30 tablet 0   aspirin EC 81 MG tablet Take 81 mg by mouth daily.     atorvastatin (LIPITOR) 10 MG tablet TAKE 1 TABLET BY MOUTH EVERY DAY 90 tablet 3   calcium-vitamin D (OSCAL WITH D) 250-125 MG-UNIT tablet Take 1 tablet by mouth daily.     clopidogrel  (PLAVIX) 75 MG tablet Take 1 tablet (75 mg total) by mouth daily. 90 tablet 1   gabapentin (NEURONTIN) 300 MG capsule TAKE 1 CAPSULE BY MOUTH THREE TIMES A DAY 90 capsule 3   hydrochlorothiazide (HYDRODIURIL) 12.5 MG tablet Take 12.5 mg by mouth every morning.     Multiple Vitamins-Minerals (ONE DAILY MULTIVITAMIN MEN PO) Take 1 tablet by mouth daily.     predniSONE (DELTASONE) 5 MG tablet TAKE 1 TABLET BY MOUTH EVERY DAY WITH BREAKFAST 90 tablet 3   traZODone (DESYREL) 50 MG tablet TAKE 1 TABLET (50 MG  TOTAL) BY MOUTH AT BEDTIME AS NEEDED. FOR SLEEP 90 tablet 1   No current facility-administered medications for this visit.    REVIEW OF SYSTEMS:   Constitutional: ( - ) fevers, ( - )  chills , ( - ) night sweats Eyes: ( - ) blurriness of vision, ( - ) double vision, ( - ) watery eyes Ears, nose, mouth, throat, and face: ( - ) mucositis, ( - ) sore throat Respiratory: ( - ) cough, ( - ) dyspnea, ( - ) wheezes Cardiovascular: ( - ) palpitation, ( - ) chest discomfort, ( - ) lower extremity swelling Gastrointestinal:  ( - ) nausea, ( - ) heartburn, ( - ) change in bowel habits Skin: ( - ) abnormal skin rashes Lymphatics: ( - ) new lymphadenopathy, ( - ) easy bruising Neurological: ( - ) numbness, ( - ) tingling, ( - ) new weaknesses Behavioral/Psych: ( - ) mood change, ( - ) new changes  All other systems were reviewed with the patient and are negative.  PHYSICAL EXAMINATION: ECOG PERFORMANCE STATUS: 1 - Symptomatic but completely ambulatory  Vitals:   11/30/22 1017  BP: 137/62  Pulse: 64  Resp: 13  Temp: 97.8 F (36.6 C)  SpO2: 100%   Filed Weights   11/30/22 1017  Weight: 187 lb 1.6 oz (84.9 kg)    GENERAL: Well-appearing elderly Caucasian male, alert, no distress and comfortable SKIN: skin color, texture, turgor are normal, no rashes or significant lesions EYES: conjunctiva are pink and non-injected, sclera clear LUNGS: clear to auscultation and percussion with normal  breathing effort HEART: regular rate & rhythm and no murmurs and no lower extremity edema Musculoskeletal: no cyanosis of digits and no clubbing  PSYCH: alert & oriented x 3, fluent speech NEURO: no focal motor/sensory deficits  LABORATORY DATA:  I have reviewed the data as listed    Latest Ref Rng & Units 11/30/2022    9:55 AM 09/14/2022    7:54 AM 06/15/2022    7:30 AM  CBC  WBC 4.0 - 10.5 K/uL 11.1  10.8  8.9   Hemoglobin 13.0 - 17.0 g/dL 04.5  40.9  81.1   Hematocrit 39.0 - 52.0 % 43.4  43.6  42.9   Platelets 150 - 400 K/uL 195  194  190        Latest Ref Rng & Units 11/30/2022    9:55 AM 09/14/2022    7:54 AM 06/15/2022    7:30 AM  CMP  Glucose 70 - 99 mg/dL 914  782  956   BUN 8 - 23 mg/dL 17  20  17    Creatinine 0.61 - 1.24 mg/dL 2.13  0.86  5.78   Sodium 135 - 145 mmol/L 141  143  144   Potassium 3.5 - 5.1 mmol/L 3.9  3.6  3.5   Chloride 98 - 111 mmol/L 105  108  110   CO2 22 - 32 mmol/L 30  28  28    Calcium 8.9 - 10.3 mg/dL 9.7  9.3  9.0   Total Protein 6.5 - 8.1 g/dL 6.5  6.4  6.3   Total Bilirubin <1.2 mg/dL 0.8  0.7  0.7   Alkaline Phos 38 - 126 U/L 74  78  79   AST 15 - 41 U/L 17  18  17    ALT 0 - 44 U/L 22  22  19     RADIOGRAPHIC STUDIES: No results found.  ASSESSMENT & PLAN LANIS HOTTE 72 y.o.  male with medical history significant for Castration-Resistant Advanced Prostate Cancer  presents for a follow up visit.  # Castration-Resistant Advanced Prostate Cancer  # Metastatic Spread to Bones  -- currently on Eligard 45mg  q 6 months, last dose on 08/2022. Next due Feb 2025.  -- continue Zytiga 1000 mg daily with prednisone 5 mg daily, started in May 2020  -- Labs today show white blood cell count 11.1, hemoglobin 15.5, MCV 92.5, platelets 195 with normal LFTs.   --PSA at 0.1 at last check in May 2024. -- Plan to have patient return to clinic in 3 months time for labs and clinic visit   # Chronic Right ankle Pain -- Follow-up after completion of  docetaxel chemotherapy. -- Notable difference in the thickness of his left ankle and right ankle.  Right ankle appears considerably less robust. -- No prior imaging or evaluation has been performed.  Would recommend referral to orthopedic surgery for further evaluation.  No orders of the defined types were placed in this encounter.   All questions were answered. The patient knows to call the clinic with any problems, questions or concerns.  A total of more than 30 minutes were spent on this encounter with face-to-face time and non-face-to-face time, including preparing to see the patient, ordering tests and/or medications, counseling the patient and coordination of care as outlined above.   Dennis Barns, MD Department of Hematology/Oncology [facility] at [facility] Phone: [phone] Pager: [phone] Email: [email or web address]  11/30/2022 10:47 AM

## 2022-12-01 ENCOUNTER — Telehealth: Payer: Self-pay | Admitting: *Deleted

## 2022-12-01 LAB — TESTOSTERONE: Testosterone: <3 ng/dL — ABNORMAL LOW (ref 264–916)

## 2022-12-01 LAB — PROSTATE-SPECIFIC AG, SERUM (LABCORP): Prostate Specific Ag, Serum: 0.2 ng/mL (ref 0.0–4.0)

## 2022-12-01 NOTE — Telephone Encounter (Signed)
-----   Message from Dennis Patel sent at 12/01/2022  8:19 AM EST ----- Please let Dennis Patel know that his PSA remains at 0.2 and testosterone is at goal at <3. We will see him back in Feb 2025 ----- Message ----- From: Leory Plowman, Lab In [place] Sent: 11/30/2022  10:09 AM EST To: [name], MD

## 2022-12-01 NOTE — Telephone Encounter (Signed)
TCT patient regarding recent lab results. No answer but was able to leave detailed message on his identified vm . Advised that his PSA remains at 0.2 and testosterone is at goal at <3. We will see him back in Feb 2025

## 2022-12-29 ENCOUNTER — Other Ambulatory Visit: Payer: Self-pay

## 2022-12-29 NOTE — Progress Notes (Signed)
Specialty Pharmacy Refill Coordination Note  Dennis Patel is a 72 y.o. male contacted today regarding refills of specialty medication(s) Abiraterone Acetate [name])   Patient requested Delivery   Delivery date: 01/05/23   Verified address: [address]   Medication will be filled on 01/04/23.

## 2023-01-02 ENCOUNTER — Ambulatory Visit: Payer: Medicare Other

## 2023-01-02 ENCOUNTER — Encounter (HOSPITAL_COMMUNITY): Payer: Medicare Other

## 2023-01-04 ENCOUNTER — Other Ambulatory Visit: Payer: Self-pay

## 2023-01-15 ENCOUNTER — Other Ambulatory Visit: Payer: Self-pay | Admitting: Hematology and Oncology

## 2023-01-23 ENCOUNTER — Other Ambulatory Visit: Payer: Self-pay

## 2023-01-23 DIAGNOSIS — I70219 Atherosclerosis of native arteries of extremities with intermittent claudication, unspecified extremity: Secondary | ICD-10-CM

## 2023-01-23 DIAGNOSIS — I739 Peripheral vascular disease, unspecified: Secondary | ICD-10-CM

## 2023-01-25 ENCOUNTER — Other Ambulatory Visit: Payer: Self-pay

## 2023-01-25 ENCOUNTER — Other Ambulatory Visit (HOSPITAL_COMMUNITY): Payer: Self-pay

## 2023-01-25 ENCOUNTER — Other Ambulatory Visit (HOSPITAL_COMMUNITY): Payer: Self-pay | Admitting: Pharmacy Technician

## 2023-01-25 NOTE — Progress Notes (Signed)
 Specialty Pharmacy Refill Coordination Note  Dennis Patel is a 73 y.o. male contacted today regarding refills of specialty medication(s) Abiraterone  Acetate (ZYTIGA )   Patient requested Delivery   Delivery date: 02/01/23   Verified address: Patient address [address]  [place]   Medication will be filled on 01/31/23.

## 2023-02-03 ENCOUNTER — Ambulatory Visit (INDEPENDENT_AMBULATORY_CARE_PROVIDER_SITE_OTHER)
Admission: RE | Admit: 2023-02-03 | Discharge: 2023-02-03 | Disposition: A | Discharge status: Home / Self Care | Payer: Medicare Other | Source: Ambulatory Visit | Attending: Vascular Surgery

## 2023-02-03 ENCOUNTER — Ambulatory Visit: Payer: Medicare Other | Admitting: Physician Assistant

## 2023-02-03 ENCOUNTER — Other Ambulatory Visit: Payer: Self-pay | Admitting: *Deleted

## 2023-02-03 ENCOUNTER — Encounter: Payer: Self-pay | Admitting: *Deleted

## 2023-02-03 ENCOUNTER — Ambulatory Visit (HOSPITAL_COMMUNITY)
Admission: RE | Admit: 2023-02-03 | Discharge: 2023-02-03 | Disposition: A | Discharge status: Home / Self Care | Payer: Medicare Other | Source: Ambulatory Visit | Attending: Vascular Surgery | Admitting: Vascular Surgery

## 2023-02-03 VITALS — BP 159/75 | HR 56 | Temp 98.0°F | Resp 18 | Ht 70.0 in | Wt 189.3 lb

## 2023-02-03 DIAGNOSIS — I739 Peripheral vascular disease, unspecified: Secondary | ICD-10-CM

## 2023-02-03 DIAGNOSIS — I70219 Atherosclerosis of native arteries of extremities with intermittent claudication, unspecified extremity: Secondary | ICD-10-CM | POA: Insufficient documentation

## 2023-02-03 LAB — VAS US ABI WITH/WO TBI
Left ABI: 0.59
Right ABI: 0.38

## 2023-02-03 NOTE — Progress Notes (Signed)
Office Note     CC:  follow up Requesting Provider:  Clovis Riley, L.August Saucer, MD  HPI: Dennis Patel is a 73 y.o. (03-15-1950) male who presents for follow up of peripheral artery disease. We have been following his lower extremity claudication, right > Left. This has been non lifestyle limiting. He has history of angioplasty and stenting of the left common iliac artery on 03/17/17 and again on 09/08/2017. This was for lifestyle limiting claudication of the LLE.   Today he reports that his symptoms overall in the RLE are worsening. This pain is brought on by ambulation and relieved with rest. He can go about 1 block before he has to stop and rest.  He says that he would like to just enjoy being able to mow his yard, or tend his garden without stopping several times or go for a walk without having to stop. He does attribute a lot of his pain to chemotherapy he received for his stage IV prostate cancer with metastatic disease to his bones. A lot of the pain is in the ankle and foot however with ambulation he will get cramping along shin and ankle at end of day.  He does exercise and goes to the gym regularly.  He says this has helped his right leg but still at point where it is very disruptive to what he wants to do. He says he tries to watch his diet and feels he has done all that he can to help improve his legs. He denies any rest pain or nonhealing ulcers. He does continue to smoke 2 cigars a day. 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    Past Surgical History:  Procedure Laterality Date   ABDOMINAL ANGIOGRAM N/A 03/17/2011   Procedure: ABDOMINAL ANGIOGRAM;  Surgeon: Corky Crafts, MD;  Location: [facility] CATH LAB;  Service: Cardiovascular;  Laterality: N/A;   ABDOMINAL AORTOGRAM W/LOWER EXTREMITY N/A 03/17/2017   Procedure: ABDOMINAL AORTOGRAM W/LOWER EXTREMITY;  Surgeon: Chuck Hint, MD;  Location: [facility] INVASIVE CV LAB;  Service: Cardiovascular;  Laterality: N/A;   ABDOMINAL AORTOGRAM W/LOWER EXTREMITY N/A 09/08/2017   Procedure: ABDOMINAL AORTOGRAM W/LOWER EXTREMITY;  Surgeon: Chuck Hint, MD;  Location: [facility] INVASIVE CV LAB;  Service: Cardiovascular;  Laterality: N/A;   ILIAC ARTERY STENT     IR FLUORO GUIDE CV LINE RIGHT  12/07/2021   IR IMAGING GUIDED PORT INSERTION  01/30/2018   IR REMOVAL TUN ACCESS W/ PORT W/O FL MOD SED  12/07/2021   LOWER EXTREMITY ANGIOGRAM N/A 03/17/2011   Procedure: LOWER EXTREMITY ANGIOGRAM;  Surgeon: Corky Crafts, MD;  Location: [facility] CATH LAB;  Service: Cardiovascular;  Laterality: N/A;   PERIPHERAL VASCULAR INTERVENTION Left 09/08/2017   Procedure: PERIPHERAL VASCULAR INTERVENTION;  Surgeon: Chuck Hint, MD;  Location: [facility] INVASIVE  CV LAB;  Service: Cardiovascular;  Laterality: Left;  Common iliac    Social History   Socioeconomic History   Marital status: Married    Spouse name: Not on file   Number of children: Not on file   Years of education: Not on file   Highest education level: Not on file  Occupational History   Not on file  Tobacco Use   Smoking status: Former    Current packs/day: 0.00    Types: Cigarettes    Quit date: 2005    Years since quitting: 20.0   Smokeless tobacco: Never  Vaping Use   Vaping status: Never Used  Substance and Sexual Activity   Alcohol use: Never   Drug use: Never   Sexual activity: Not on file  Other Topics Concern   Not on  file  Social History Narrative   ** Merged History Encounter **       ** Merged History Encounter **       Social Drivers of Corporate investment banker Strain: Not on file  Food Insecurity: Not on file  Transportation Needs: Not on file  Physical Activity: Not on file  Stress: Not on file  Social Connections: Not on file  Intimate Partner Violence: Not on file    Family History  Problem Relation Age of Onset   Ovarian cancer Mother 45   Diabetes Mother    CAD Father    Diabetes Sister    Lymphoma Brother 45   Skin cancer Brother        basal and squamous cell   Ovarian cancer Maternal Grandmother    CAD Son    Diabetes Son    Heart disease Son    Ovarian cancer Maternal Great-grandmother     Current Outpatient Medications  Medication Sig Dispense Refill   abiraterone acetate (ZYTIGA) 250 MG tablet Take 4 tablets (1,000 mg total) by mouth daily. Take on an empty stomach 1 hour before or 2 hours after a meal 120 tablet 9   acetaminophen (TYLENOL) 325 MG tablet Take 650 mg by mouth every 6 (six) hours as needed.     amLODipine (NORVASC) 10 MG tablet Take 1 tablet (10 mg total) by mouth daily. 30 tablet 0   aspirin EC 81 MG tablet Take 81 mg by mouth daily.     atorvastatin (LIPITOR) 10 MG tablet TAKE 1 TABLET BY MOUTH EVERY DAY 90 tablet 3   calcium-vitamin D (OSCAL WITH D) 250-125 MG-UNIT tablet Take 1 tablet by mouth daily.     clopidogrel (PLAVIX) 75 MG tablet Take 1 tablet (75 mg total) by mouth daily. 90 tablet 1   gabapentin (NEURONTIN) 300 MG capsule TAKE 1 CAPSULE BY MOUTH THREE TIMES A DAY 90 capsule 3   hydrochlorothiazide (HYDRODIURIL) 12.5 MG tablet Take 12.5 mg by mouth every morning.     Multiple Vitamins-Minerals (ONE DAILY MULTIVITAMIN MEN PO) Take 1 tablet by mouth daily.     predniSONE (DELTASONE) 5 MG tablet TAKE 1 TABLET BY MOUTH EVERY DAY WITH BREAKFAST 90 tablet 3   traZODone (DESYREL) 50 MG tablet TAKE 1 TABLET (50 MG TOTAL) BY MOUTH AT BEDTIME AS  NEEDED. FOR SLEEP 90 tablet 1   No current facility-administered medications for this visit.    No Known Allergies   REVIEW OF SYSTEMS:  [X]  denotes positive finding, [ ]  denotes negative finding Cardiac  Comments:  Chest pain or chest pressure:    Shortness of breath upon exertion:  Short of breath when lying flat:    Irregular heart rhythm:        Vascular    Pain in calf, thigh, or hip brought on by ambulation:    Pain in feet at night that wakes you up from your sleep:     Blood clot in your veins:    Leg swelling:         Pulmonary    Oxygen at home:    Productive cough:     Wheezing:         Neurologic    Sudden weakness in arms or legs:     Sudden numbness in arms or legs:     Sudden onset of difficulty speaking or slurred speech:    Temporary loss of vision in one eye:     Problems with dizziness:         Gastrointestinal    Blood in stool:     Vomited blood:         Genitourinary    Burning when urinating:     Blood in urine:        Psychiatric    Major depression:         Hematologic    Bleeding problems:    Problems with blood clotting too easily:        Skin    Rashes or ulcers:        Constitutional    Fever or chills:      PHYSICAL EXAMINATION:  Vitals:   02/03/23 0923  BP: (!) 159/75  Pulse: (!) 56  Resp: 18  Temp: 98 F (36.7 C)  TempSrc: Temporal  SpO2: 98%  Weight: 189 lb 4.8 oz (85.9 kg)  Height: 5\' 10"  (1.778 m)    General:  WDWN in NAD; vital signs documented above Gait: Normal HENT: WNL, normocephalic Pulmonary: normal non-labored breathing , without wheezing Cardiac: regular HR Abdomen: soft Vascular Exam/Pulses: 1+ femoral pulses, no distal pulses, monophasic doppler DP and PT signals bilaterally Extremities: without ischemic changes, without Gangrene , without cellulitis; without open wounds; right foot ruborous Musculoskeletal: no muscle wasting or atrophy  Neurologic: A&O X 3 Psychiatric:  The pt has Normal  affect.   Non-Invasive Vascular Imaging:   +-------+-----------+-----------+------------+------------+  ABI/TBIToday's ABIToday's TBIPrevious ABIPrevious TBI  +-------+-----------+-----------+------------+------------+  Right 0.38       0.23       0.46        0.28          +-------+-----------+-----------+------------+------------+  Left  0.59       0.36       0.68        0.54          +-------+-----------+-----------+------------+------------+  Right great toe pressure: 33 mmHg, dampened monophasic Left great toe pressure: 52 mmHg, monophasic  VAS US Aorta/ IVC/ Iliacs: Summary:  Stenosis: +-------------------+-------------+--------------+  Location           Stenosis     Stent           +-------------------+-------------+--------------+  Left Common Iliac               1-49% stenosis  +-------------------+-------------+--------------+  Left External Iliac<50% stenosis                +-------------------+-------------+--------------+    ASSESSMENT/PLAN:: 73 y.o. male here for follow up for peripheral artery disease. We have been following his lower extremity claudication, right > Left. This has been non lifestyle limiting. He has history of  angioplasty and stenting of the left common iliac artery on 03/17/17 and again on 09/08/2017. This was for lifestyle limiting claudication of the LLE. His right lower extremity claudication has now progressed and is lifestyle limiting. He does have a lot of pain in right leg secondary to chemotherapy. We discussed that he has known SFA occlusion that likely may ultimately require surgical intervention. Also discussed that even with improved perfusion of his RLE he will still have some pain secondary to his chemotherapy and nerve damage. He voiced his understanding and wishes to proceed with Angiogram. He would like to be scheduled with whoever has earliest availability as he was previously a Dr. Edilia Bo patient. Plan will be  for Aortogram, Arteriogram of BLE with possible intervention of RLE with angioplasty/ and or stenting of the right SFA; angioplasty of possible tibial vessels. Will get him scheduled for this in the near future.    Graceann Congress, PA-C [facility] [phone]  Clinic MD:   Hetty Blend

## 2023-02-03 NOTE — H&P (View-Only) (Signed)
Office Note     CC:  follow up Requesting Provider:  Clovis Riley, L.August Saucer, MD  HPI: Dennis Patel is a 73 y.o. (02/26/1950) male who presents for follow up of peripheral artery disease. We have been following his lower extremity claudication, right > Left. This has been non lifestyle limiting. He has history of angioplasty and stenting of the left common iliac artery on 03/17/17 and again on 09/08/2017. This was for lifestyle limiting claudication of the LLE.   Today he reports that his symptoms overall in the RLE are worsening. This pain is brought on by ambulation and relieved with rest. He can go about 1 block before he has to stop and rest.  He says that he would like to just enjoy being able to mow his yard, or tend his garden without stopping several times or go for a walk without having to stop. He does attribute a lot of his pain to chemotherapy he received for his stage IV prostate cancer with metastatic disease to his bones. A lot of the pain is in the ankle and foot however with ambulation he will get cramping along shin and ankle at end of day.  He does exercise and goes to the gym regularly.  He says this has helped his right leg but still at point where it is very disruptive to what he wants to do. He says he tries to watch his diet and feels he has done all that he can to help improve his legs. He denies any rest pain or nonhealing ulcers. He does continue to smoke 2 cigars a day. 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    Past Surgical History:  Procedure Laterality Date   ABDOMINAL ANGIOGRAM N/A 03/17/2011   Procedure: ABDOMINAL ANGIOGRAM;  Surgeon: Corky Crafts, MD;  Location: [facility] CATH LAB;  Service: Cardiovascular;  Laterality: N/A;   ABDOMINAL AORTOGRAM W/LOWER EXTREMITY N/A 03/17/2017   Procedure: ABDOMINAL AORTOGRAM W/LOWER EXTREMITY;  Surgeon: Chuck Hint, MD;  Location: [facility] INVASIVE CV LAB;  Service: Cardiovascular;  Laterality: N/A;   ABDOMINAL AORTOGRAM W/LOWER EXTREMITY N/A 09/08/2017   Procedure: ABDOMINAL AORTOGRAM W/LOWER EXTREMITY;  Surgeon: Chuck Hint, MD;  Location: [facility] INVASIVE CV LAB;  Service: Cardiovascular;  Laterality: N/A;   ILIAC ARTERY STENT     IR FLUORO GUIDE CV LINE RIGHT  12/07/2021   IR IMAGING GUIDED PORT INSERTION  01/30/2018   IR REMOVAL TUN ACCESS W/ PORT W/O FL MOD SED  12/07/2021   LOWER EXTREMITY ANGIOGRAM N/A 03/17/2011   Procedure: LOWER EXTREMITY ANGIOGRAM;  Surgeon: Corky Crafts, MD;  Location: [facility] CATH LAB;  Service: Cardiovascular;  Laterality: N/A;   PERIPHERAL VASCULAR INTERVENTION Left 09/08/2017   Procedure: PERIPHERAL VASCULAR INTERVENTION;  Surgeon: Chuck Hint, MD;  Location: [facility] INVASIVE  CV LAB;  Service: Cardiovascular;  Laterality: Left;  Common iliac    Social History   Socioeconomic History   Marital status: Married    Spouse name: Not on file   Number of children: Not on file   Years of education: Not on file   Highest education level: Not on file  Occupational History   Not on file  Tobacco Use   Smoking status: Former    Current packs/day: 0.00    Types: Cigarettes    Quit date: 2005    Years since quitting: 20.0   Smokeless tobacco: Never  Vaping Use   Vaping status: Never Used  Substance and Sexual Activity   Alcohol use: Never   Drug use: Never   Sexual activity: Not on file  Other Topics Concern   Not on  file  Social History Narrative   ** Merged History Encounter **       ** Merged History Encounter **       Social Drivers of Corporate investment banker Strain: Not on file  Food Insecurity: Not on file  Transportation Needs: Not on file  Physical Activity: Not on file  Stress: Not on file  Social Connections: Not on file  Intimate Partner Violence: Not on file    Family History  Problem Relation Age of Onset   Ovarian cancer Mother 46   Diabetes Mother    CAD Father    Diabetes Sister    Lymphoma Brother 45   Skin cancer Brother        basal and squamous cell   Ovarian cancer Maternal Grandmother    CAD Son    Diabetes Son    Heart disease Son    Ovarian cancer Maternal Great-grandmother     Current Outpatient Medications  Medication Sig Dispense Refill   abiraterone acetate (ZYTIGA) 250 MG tablet Take 4 tablets (1,000 mg total) by mouth daily. Take on an empty stomach 1 hour before or 2 hours after a meal 120 tablet 9   acetaminophen (TYLENOL) 325 MG tablet Take 650 mg by mouth every 6 (six) hours as needed.     amLODipine (NORVASC) 10 MG tablet Take 1 tablet (10 mg total) by mouth daily. 30 tablet 0   aspirin EC 81 MG tablet Take 81 mg by mouth daily.     atorvastatin (LIPITOR) 10 MG tablet TAKE 1 TABLET BY MOUTH EVERY DAY 90 tablet 3   calcium-vitamin D (OSCAL WITH D) 250-125 MG-UNIT tablet Take 1 tablet by mouth daily.     clopidogrel (PLAVIX) 75 MG tablet Take 1 tablet (75 mg total) by mouth daily. 90 tablet 1   gabapentin (NEURONTIN) 300 MG capsule TAKE 1 CAPSULE BY MOUTH THREE TIMES A DAY 90 capsule 3   hydrochlorothiazide (HYDRODIURIL) 12.5 MG tablet Take 12.5 mg by mouth every morning.     Multiple Vitamins-Minerals (ONE DAILY MULTIVITAMIN MEN PO) Take 1 tablet by mouth daily.     predniSONE (DELTASONE) 5 MG tablet TAKE 1 TABLET BY MOUTH EVERY DAY WITH BREAKFAST 90 tablet 3   traZODone (DESYREL) 50 MG tablet TAKE 1 TABLET (50 MG TOTAL) BY MOUTH AT BEDTIME AS  NEEDED. FOR SLEEP 90 tablet 1   No current facility-administered medications for this visit.    No Known Allergies   REVIEW OF SYSTEMS:  [X]  denotes positive finding, [ ]  denotes negative finding Cardiac  Comments:  Chest pain or chest pressure:    Shortness of breath upon exertion:  Short of breath when lying flat:    Irregular heart rhythm:        Vascular    Pain in calf, thigh, or hip brought on by ambulation:    Pain in feet at night that wakes you up from your sleep:     Blood clot in your veins:    Leg swelling:         Pulmonary    Oxygen at home:    Productive cough:     Wheezing:         Neurologic    Sudden weakness in arms or legs:     Sudden numbness in arms or legs:     Sudden onset of difficulty speaking or slurred speech:    Temporary loss of vision in one eye:     Problems with dizziness:         Gastrointestinal    Blood in stool:     Vomited blood:         Genitourinary    Burning when urinating:     Blood in urine:        Psychiatric    Major depression:         Hematologic    Bleeding problems:    Problems with blood clotting too easily:        Skin    Rashes or ulcers:        Constitutional    Fever or chills:      PHYSICAL EXAMINATION:  Vitals:   02/03/23 0923  BP: (!) 159/75  Pulse: (!) 56  Resp: 18  Temp: 98 F (36.7 C)  TempSrc: Temporal  SpO2: 98%  Weight: 189 lb 4.8 oz (85.9 kg)  Height: 5\' 10"  (1.778 m)    General:  WDWN in NAD; vital signs documented above Gait: Normal HENT: WNL, normocephalic Pulmonary: normal non-labored breathing , without wheezing Cardiac: regular HR Abdomen: soft Vascular Exam/Pulses: 1+ femoral pulses, no distal pulses, monophasic doppler DP and PT signals bilaterally Extremities: without ischemic changes, without Gangrene , without cellulitis; without open wounds; right foot ruborous Musculoskeletal: no muscle wasting or atrophy  Neurologic: A&O X 3 Psychiatric:  The pt has Normal  affect.   Non-Invasive Vascular Imaging:   +-------+-----------+-----------+------------+------------+  ABI/TBIToday's ABIToday's TBIPrevious ABIPrevious TBI  +-------+-----------+-----------+------------+------------+  Right 0.38       0.23       0.46        0.28          +-------+-----------+-----------+------------+------------+  Left  0.59       0.36       0.68        0.54          +-------+-----------+-----------+------------+------------+  Right great toe pressure: 33 mmHg, dampened monophasic Left great toe pressure: 52 mmHg, monophasic  VAS US Aorta/ IVC/ Iliacs: Summary:  Stenosis: +-------------------+-------------+--------------+  Location           Stenosis     Stent           +-------------------+-------------+--------------+  Left Common Iliac               1-49% stenosis  +-------------------+-------------+--------------+  Left External Iliac<50% stenosis                +-------------------+-------------+--------------+    ASSESSMENT/PLAN:: 73 y.o. male here for follow up for peripheral artery disease. We have been following his lower extremity claudication, right > Left. This has been non lifestyle limiting. He has history of  angioplasty and stenting of the left common iliac artery on 03/17/17 and again on 09/08/2017. This was for lifestyle limiting claudication of the LLE. His right lower extremity claudication has now progressed and is lifestyle limiting. He does have a lot of pain in right leg secondary to chemotherapy. We discussed that he has known SFA occlusion that likely may ultimately require surgical intervention. Also discussed that even with improved perfusion of his RLE he will still have some pain secondary to his chemotherapy and nerve damage. He voiced his understanding and wishes to proceed with Angiogram. He would like to be scheduled with whoever has earliest availability as he was previously a Dr. Edilia Bo patient. Plan will be  for Aortogram, Arteriogram of BLE with possible intervention of RLE with angioplasty/ and or stenting of the right SFA; angioplasty of possible tibial vessels. Will get him scheduled for this in the near future.    Graceann Congress, PA-C [facility] [phone]  Clinic MD:   Hetty Blend

## 2023-02-13 ENCOUNTER — Encounter (HOSPITAL_COMMUNITY)
Admission: RE | Disposition: A | Discharge status: Home / Self Care | Payer: Self-pay | Source: Home / Self Care | Attending: Vascular Surgery

## 2023-02-13 ENCOUNTER — Other Ambulatory Visit: Payer: Self-pay

## 2023-02-13 ENCOUNTER — Ambulatory Visit (HOSPITAL_COMMUNITY)
Admission: RE | Admit: 2023-02-13 | Discharge: 2023-02-13 | Disposition: A | Discharge status: Home / Self Care | Payer: Medicare Other | Attending: Vascular Surgery | Admitting: Vascular Surgery

## 2023-02-13 ENCOUNTER — Encounter (HOSPITAL_COMMUNITY): Payer: Self-pay | Admitting: Vascular Surgery

## 2023-02-13 DIAGNOSIS — Z79899 Other long term (current) drug therapy: Secondary | ICD-10-CM | POA: Diagnosis not present

## 2023-02-13 DIAGNOSIS — I70213 Atherosclerosis of native arteries of extremities with intermittent claudication, bilateral legs: Secondary | ICD-10-CM | POA: Insufficient documentation

## 2023-02-13 DIAGNOSIS — Z9221 Personal history of antineoplastic chemotherapy: Secondary | ICD-10-CM | POA: Diagnosis not present

## 2023-02-13 DIAGNOSIS — M25579 Pain in unspecified ankle and joints of unspecified foot: Secondary | ICD-10-CM | POA: Insufficient documentation

## 2023-02-13 DIAGNOSIS — C61 Malignant neoplasm of prostate: Secondary | ICD-10-CM | POA: Diagnosis not present

## 2023-02-13 DIAGNOSIS — C7951 Secondary malignant neoplasm of bone: Secondary | ICD-10-CM | POA: Diagnosis not present

## 2023-02-13 DIAGNOSIS — F17290 Nicotine dependence, other tobacco product, uncomplicated: Secondary | ICD-10-CM | POA: Insufficient documentation

## 2023-02-13 DIAGNOSIS — Z7902 Long term (current) use of antithrombotics/antiplatelets: Secondary | ICD-10-CM | POA: Insufficient documentation

## 2023-02-13 DIAGNOSIS — I70219 Atherosclerosis of native arteries of extremities with intermittent claudication, unspecified extremity: Secondary | ICD-10-CM

## 2023-02-13 HISTORY — PX: ABDOMINAL AORTOGRAM W/LOWER EXTREMITY: CATH118223

## 2023-02-13 LAB — POCT I-STAT, CHEM 8
BUN: 26 mg/dL — ABNORMAL HIGH (ref 8–23)
Calcium, Ion: 1.17 mmol/L (ref 1.15–1.40)
Chloride: 105 mmol/L (ref 98–111)
Creatinine, Ser: 1 mg/dL (ref 0.61–1.24)
Glucose, Bld: 108 mg/dL — ABNORMAL HIGH (ref 70–99)
HCT: 39 % (ref 39.0–52.0)
Hemoglobin: 13.3 g/dL (ref 13.0–17.0)
Potassium: 3.7 mmol/L (ref 3.5–5.1)
Sodium: 142 mmol/L (ref 135–145)
TCO2: 24 mmol/L (ref 22–32)

## 2023-02-13 SURGERY — ABDOMINAL AORTOGRAM W/LOWER EXTREMITY
Anesthesia: LOCAL

## 2023-02-13 MED ORDER — MIDAZOLAM HCL 2 MG/2ML IJ SOLN
INTRAMUSCULAR | Status: AC
Start: 2023-02-13 — End: ?
  Filled 2023-02-13: qty 2

## 2023-02-13 MED ORDER — MIDAZOLAM HCL 2 MG/2ML IJ SOLN
INTRAMUSCULAR | Status: DC | PRN
  Administered 2023-02-13: 1 mg via INTRAVENOUS

## 2023-02-13 MED ORDER — IODIXANOL 320 MG/ML IV SOLN
INTRAVENOUS | Status: DC | PRN
  Administered 2023-02-13: 100 mL

## 2023-02-13 MED ORDER — LIDOCAINE HCL (PF) 1 % IJ SOLN
INTRAMUSCULAR | Status: DC | PRN
  Administered 2023-02-13: 5 mL

## 2023-02-13 MED ORDER — ONDANSETRON HCL 4 MG/2ML IJ SOLN: 4.0000 mg | Freq: Four times a day (QID) | INTRAMUSCULAR | Status: DC | PRN

## 2023-02-13 MED ORDER — SODIUM CHLORIDE 0.9 % IV SOLN: 250.0000 mL | INTRAVENOUS | Status: DC | PRN

## 2023-02-13 MED ORDER — HYDRALAZINE HCL 20 MG/ML IJ SOLN: 5.0000 mg | INTRAMUSCULAR | Status: DC | PRN

## 2023-02-13 MED ORDER — HEPARIN (PORCINE) IN NACL 1000-0.9 UT/500ML-% IV SOLN
INTRAVENOUS | Status: DC | PRN
  Administered 2023-02-13: 1000 mL

## 2023-02-13 MED ORDER — SODIUM CHLORIDE 0.9% FLUSH: 3.0000 mL | Freq: Two times a day (BID) | INTRAVENOUS | Status: DC

## 2023-02-13 MED ORDER — LIDOCAINE HCL (PF) 1 % IJ SOLN
INTRAMUSCULAR | Status: AC
  Filled 2023-02-13: qty 30

## 2023-02-13 MED ORDER — ACETAMINOPHEN 325 MG PO TABS
650.0000 mg | ORAL_TABLET | ORAL | Status: DC | PRN
Start: 2023-02-13 — End: 2023-02-13

## 2023-02-13 MED ORDER — FENTANYL CITRATE (PF) 100 MCG/2ML IJ SOLN
INTRAMUSCULAR | Status: AC
  Filled 2023-02-13: qty 2

## 2023-02-13 MED ORDER — LABETALOL HCL 5 MG/ML IV SOLN: 10.0000 mg | INTRAVENOUS | Status: DC | PRN

## 2023-02-13 MED ORDER — FENTANYL CITRATE (PF) 100 MCG/2ML IJ SOLN
INTRAMUSCULAR | Status: DC | PRN
  Administered 2023-02-13: 50 ug via INTRAVENOUS

## 2023-02-13 MED ORDER — SODIUM CHLORIDE 0.9 % IV SOLN: INTRAVENOUS | Status: AC

## 2023-02-13 MED ORDER — SODIUM CHLORIDE 0.9% FLUSH: 3.0000 mL | INTRAVENOUS | Status: DC | PRN

## 2023-02-13 MED ORDER — SODIUM CHLORIDE 0.9 % IV SOLN: INTRAVENOUS | Status: DC

## 2023-02-13 SURGICAL SUPPLY — 14 items
CATH OMNI FLUSH 5F 65CM (CATHETERS) IMPLANT
COVER DOME SNAP 22 D (MISCELLANEOUS) IMPLANT
DEVICE TORQUE SEADRAGON GRN (MISCELLANEOUS) IMPLANT
GLIDEWIRE ADV .035X260CM (WIRE) IMPLANT
KIT MICROPUNCTURE NIT STIFF (SHEATH) IMPLANT
KIT SYRINGE INJ CVI SPIKEX1 (MISCELLANEOUS) IMPLANT
PROTECTION STATION PRESSURIZED (MISCELLANEOUS) ×1
SET ATX-X65L (MISCELLANEOUS) IMPLANT
SHEATH PINNACLE 5F 10CM (SHEATH) IMPLANT
SHEATH PROBE COVER 6X72 (BAG) IMPLANT
STATION PROTECTION PRESSURIZED (MISCELLANEOUS) IMPLANT
TRAY PV CATH (CUSTOM PROCEDURE TRAY) ×1 IMPLANT
WIRE BENTSON .035X145CM (WIRE) IMPLANT
WIRE MICRO SET SILHO 5FR 7 (SHEATH) IMPLANT

## 2023-02-13 NOTE — Op Note (Signed)
    Patient name: Dennis Patel MRN: [id number] DOB: 12-Jul-1950 Sex: male  02/13/2023 Pre-operative Diagnosis: PAD with lifestyle limiting claudication Post-operative diagnosis:  Same Surgeon:  [name], MD Procedure Performed:  Ultrasound-guided access of left common femoral artery Aortogram and bilateral lower extremity angiogram Moderate sedation 28 minutes   Indications: Patient is a 73 year old male who presents to the clinic for follow-up of PAD.  He previously underwent left common iliac artery stenting in 2019 for lifestyle limiting claudication.  His right leg had progressed to lifestyle limiting claudication and expressed he was unable to do his yard work or to do his garden without having to stop and rest several times.  Risks and benefits of aortogram with possible right lower extremity intervention were reviewed, patient expressed understanding and is willing to proceed.  Findings: Widely patent aorta and bilateral renal arteries.  Right common iliac with 50% stenosis, chronic occlusion of external iliac with reconstitution of a very diseased right common femoral.  The profunda is widely patent.  The SFA is patent for a short distance but has significant calcific disease and then occludes.  There appears to be reconstitution of the below-knee popliteal artery with runoff via the AT and PT although evaluation of vasculature below the knee was limited.  Left common iliac stent patent, patent internal and external iliac.  The common femoral is moderately diseased.  The SFA is chronically occluded, the profunda is widely patent.  The behind the popliteal artery reconstitutes, there is a severe stenosis just proximal to the takeoff of the AT and there appears to be three-vessel runoff to the foot.   Procedure:  The patient was identified in the holding area and taken to the cath lab  The patient was then placed supine on the table and prepped and draped in the usual sterile  fashion.  A time out was called.  Ultrasound was used to evaluate the right common femoral artery.  It was patent .  A digital ultrasound image was acquired.  A micropuncture needle was used to access the right common femoral artery under ultrasound guidance.  An 018 wire was advanced without resistance and a micropuncture sheath was placed.  The 018 wire was removed and a benson wire was placed.  The micropuncture sheath was exchanged for a 5 french sheath.  An omniflush catheter was advanced over the wire to the level of L-1.  An abdominal angiogram was obtained.  Next, using the omniflush catheter and a glide advantage wire, I attempted to cross the aortic bifurcation although the wire would not pass into the external iliac.  A left anterior oblique projection of the right iliac system was obtained which demonstrated an occluded external iliac, a patent internal iliac with reconstitution of a very diseased and calcific right common femoral artery.  A bilateral lower extremity runoff was performed with a catheter in the distal aorta which demonstrated the above findings. The sheath was left in place in the left common femoral artery with plan to remove with a manual pressure hold and holding due to his left common femoral disease.  Contrast: 100 cc Sedation: 28 minutes  Impression: Severe inflow disease on the right.  Briefly discussed right common femoral endarterectomy with retrograde iliac stenting.  Will plan to see patient back in clinic in the next few weeks to discuss risk and benefits.   [name] MD [facility] [place] Office: [phone]

## 2023-02-13 NOTE — Progress Notes (Signed)
SITE AREA: left groin/femoral  SITE PRIOR TO REMOVAL:  LEVEL 0  PRESSURE APPLIED FOR: approximately 25 minutes   MANUAL: yes, sheath removed by Lowella Grip, RCIS  PATIENT STATUS DURING PULL: stable  POST PULL SITE:  LEVEL 0  POST PULL INSTRUCTIONS GIVEN: yes, drsg x24, no sitting in water x1 week, no hot tubs, bath tubs, or pools, climb into and out of vehicles easily, no running, jumping, take it easy on stairs, please call 911 if any bleeding at home, or notify staff at hospital  POST PULL PULSES PRESENT: bilateral pedal and post tibial pulses  pulses dopplerable  DRESSING APPLIED: gauze with tegaderm  BEDREST BEGINS @ 0943  COMMENTS:

## 2023-02-13 NOTE — Interval H&P Note (Signed)
History and Physical Interval Note:  02/13/2023 7:22 AM  Dennis Patel  has presented today for surgery, with the diagnosis of atherosclerosis of the artery of the lower extremity with claudication.  The various methods of treatment have been discussed with the patient and family. After consideration of risks, benefits and other options for treatment, the patient has consented to  Procedure(s): ABDOMINAL AORTOGRAM W/LOWER EXTREMITY (N/A) as a surgical intervention.  The patient's history has been reviewed, patient examined, no change in status, stable for surgery.  I have reviewed the patient's chart and labs.  Questions were answered to the patient's satisfaction.     [name]

## 2023-02-21 ENCOUNTER — Other Ambulatory Visit (HOSPITAL_COMMUNITY): Payer: Self-pay

## 2023-02-23 ENCOUNTER — Other Ambulatory Visit (HOSPITAL_COMMUNITY): Payer: Self-pay

## 2023-02-24 ENCOUNTER — Other Ambulatory Visit: Payer: Self-pay

## 2023-02-24 NOTE — Progress Notes (Signed)
 Specialty Pharmacy Refill Coordination Note  Dennis Patel is a 73 y.o. male contacted today regarding refills of specialty medication(s) Abiraterone  Acetate (ZYTIGA )   Patient requested Delivery   Delivery date: 03/03/23   Verified address: Patient address [address]  [place]   Medication will be filled on 03/02/23.

## 2023-02-24 NOTE — Progress Notes (Signed)
 Specialty Pharmacy Ongoing Clinical Assessment Note  Dennis Patel is a 73 y.o. male who is being followed by the specialty pharmacy service for RxSp Oncology   Patient's specialty medication(s) reviewed today: Abiraterone  Acetate (ZYTIGA )   Missed doses in the last 4 weeks: 0   Patient/Caregiver did not have any additional questions or concerns.   Therapeutic benefit summary: Patient is achieving benefit   Adverse events/side effects summary: Experienced adverse events/side effects (continued hot flashes and dry mouth)   Patient's therapy is appropriate to: Continue    Goals Addressed             This Visit's Progress    Slow Disease Progression       Patient is on track. Patient will maintain adherence. PSA remains stable.          Follow up:  6 months  Efrata Brunner M Jakarie Pember Specialty Pharmacist

## 2023-03-03 DIAGNOSIS — I10 Essential (primary) hypertension: Secondary | ICD-10-CM | POA: Diagnosis not present

## 2023-03-03 DIAGNOSIS — R7303 Prediabetes: Secondary | ICD-10-CM | POA: Diagnosis not present

## 2023-03-03 DIAGNOSIS — Z1331 Encounter for screening for depression: Secondary | ICD-10-CM | POA: Diagnosis not present

## 2023-03-03 DIAGNOSIS — I70219 Atherosclerosis of native arteries of extremities with intermittent claudication, unspecified extremity: Secondary | ICD-10-CM | POA: Diagnosis not present

## 2023-03-03 DIAGNOSIS — E782 Mixed hyperlipidemia: Secondary | ICD-10-CM | POA: Diagnosis not present

## 2023-03-03 DIAGNOSIS — Z23 Encounter for immunization: Secondary | ICD-10-CM | POA: Diagnosis not present

## 2023-03-03 DIAGNOSIS — G620 Drug-induced polyneuropathy: Secondary | ICD-10-CM | POA: Diagnosis not present

## 2023-03-03 DIAGNOSIS — Z0000 Encounter for general adult medical examination without abnormal findings: Secondary | ICD-10-CM | POA: Diagnosis not present

## 2023-03-07 NOTE — H&P (View-Only) (Signed)
 Patient ID: Dennis Patel, male   DOB: February 07, 1950, 73 y.o.   MRN: [id number]  Reason for Consult: Routine Post Op   Referred by No ref. provider found  Subjective:     HPI  REYNARD CHRISTOFFERSEN is a 73 y.o. male presenting for follow-up of PAD with claudication.  He underwent diagnostic angiogram on 02/13/2023 and no intervention was able to be performed as he had severe inflow disease on the right with an occluded external iliac and a 50% right common iliac stenosis.  He returns today to discuss the possibility of surgery. Today he reports he is having continued right foot and calf pain that is interfering with his work.  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  He also reports he is beginning to have some right leg and foot pain at night.  He denies nonhealing wounds.  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   Family History  Problem Relation Age of Onset   Ovarian cancer Mother 59   Diabetes Mother    CAD Father    Diabetes Sister    Lymphoma Brother 9   Skin cancer Brother        basal and squamous cell   Ovarian cancer Maternal Grandmother    CAD Son    Diabetes Son    Heart disease Son    Ovarian cancer Maternal Great-grandmother    Past Surgical History:  Procedure Laterality Date   ABDOMINAL ANGIOGRAM N/A 03/17/2011   Procedure: ABDOMINAL ANGIOGRAM;   Surgeon: Corky Crafts, MD;  Location: [facility] CATH LAB;  Service: Cardiovascular;  Laterality: N/A;   ABDOMINAL AORTOGRAM W/LOWER EXTREMITY N/A 03/17/2017   Procedure: ABDOMINAL AORTOGRAM W/LOWER EXTREMITY;  Surgeon: Chuck Hint, MD;  Location: [facility] INVASIVE CV LAB;  Service: Cardiovascular;  Laterality: N/A;   ABDOMINAL AORTOGRAM W/LOWER EXTREMITY N/A 09/08/2017   Procedure: ABDOMINAL AORTOGRAM W/LOWER EXTREMITY;  Surgeon: Chuck Hint, MD;  Location: [facility] INVASIVE CV LAB;  Service: Cardiovascular;  Laterality: N/A;   ABDOMINAL AORTOGRAM W/LOWER EXTREMITY N/A 02/13/2023   Procedure: ABDOMINAL AORTOGRAM W/LOWER EXTREMITY;  Surgeon: [name], MD;  Location: [facility] INVASIVE CV LAB;  Service: Cardiovascular;  Laterality: N/A;   ILIAC ARTERY STENT     IR FLUORO GUIDE CV LINE RIGHT  12/07/2021   IR IMAGING GUIDED PORT INSERTION  01/30/2018   IR REMOVAL TUN ACCESS W/ PORT W/O FL MOD SED  12/07/2021   LOWER EXTREMITY ANGIOGRAM N/A 03/17/2011   Procedure: LOWER EXTREMITY ANGIOGRAM;  Surgeon: Corky Crafts, MD;  Location: [facility] CATH LAB;  Service: Cardiovascular;  Laterality: N/A;   PERIPHERAL VASCULAR INTERVENTION Left 09/08/2017   Procedure: PERIPHERAL VASCULAR INTERVENTION;  Surgeon: Chuck Hint, MD;  Location: [facility] INVASIVE  CV LAB;  Service: Cardiovascular;  Laterality: Left;  Common iliac    Short Social History:  Social History   Tobacco Use   Smoking status: Former    Current packs/day: 0.00    Types: Cigarettes    Quit date: 2005    Years since quitting: 20.1   Smokeless tobacco: Never  Substance Use Topics   Alcohol use: Never    No Known Allergies  Current Outpatient Medications  Medication Sig Dispense Refill   abiraterone acetate (ZYTIGA) 250 MG tablet Take 4 tablets (1,000 mg total) by mouth daily. Take on an empty stomach 1 hour before or 2 hours after a meal 120 tablet 9   acetaminophen (TYLENOL) 500 MG tablet Take 1,000 mg by mouth every evening.      amLODipine (NORVASC) 10 MG tablet Take 1 tablet (10 mg total) by mouth daily. 30 tablet 0   atorvastatin (LIPITOR) 10 MG tablet TAKE 1 TABLET BY MOUTH EVERY DAY 90 tablet 3   calcium-vitamin D (OSCAL WITH D) 250-125 MG-UNIT tablet Take 1 tablet by mouth in the morning.     clopidogrel (PLAVIX) 75 MG tablet Take 1 tablet (75 mg total) by mouth daily. 90 tablet 1   famotidine (PEPCID) 10 MG tablet Take 10 mg by mouth in the morning.     gabapentin (NEURONTIN) 300 MG capsule TAKE 1 CAPSULE BY MOUTH THREE TIMES A DAY (Patient taking differently: Take 300 mg by mouth 2 (two) times daily.) 90 capsule 3   hydrochlorothiazide (HYDRODIURIL) 12.5 MG tablet Take 12.5 mg by mouth every morning.     Multiple Vitamins-Minerals (ONE DAILY MULTIVITAMIN MEN PO) Take 1 tablet by mouth in the morning.     naproxen sodium (ALEVE) 220 MG tablet Take 220 mg by mouth in the morning.     POTASSIUM PO Take 2 tablets by mouth in the morning.     predniSONE (DELTASONE) 5 MG tablet TAKE 1 TABLET BY MOUTH EVERY DAY WITH BREAKFAST 90 tablet 3   traZODone (DESYREL) 50 MG tablet TAKE 1 TABLET (50 MG TOTAL) BY MOUTH AT BEDTIME AS NEEDED. FOR SLEEP (Patient taking differently: Take 50 mg by mouth at bedtime. for sleep) 90 tablet 1   No current facility-administered medications for this visit.    REVIEW OF SYSTEMS   All other systems were reviewed and are negative     Objective:  Objective   Vitals:   03/10/23 0844  BP: (!) 143/74  Pulse: 66  Resp: 20  Temp: 98.3 F (36.8 C)  SpO2: 95%  Weight: 190 lb (86.2 kg)  Height: 5\' 8"  (1.727 m)   Body mass index is 28.89 kg/m.  Physical Exam General: no acute distress Cardiac: hemodynamically stable Pulm: normal work of breathing Neuro: alert, no focal deficit Extremities: Rubor of right foot, no edema, cyanosis or wounds   Data: Reviewed diagnostic angiogram from 02/13/2023  ABI +---------+------------------+-----+-------------------+--------+  Right    Rt Pressure (mmHg)IndexWaveform           Comment   +---------+------------------+-----+-------------------+--------+  Brachial 146                                                 +---------+------------------+-----+-------------------+--------+  PTA     56                0.38 dampened monophasic          +---------+------------------+-----+-------------------+--------+  DP      49                0.34 dampened monophasic          +---------+------------------+-----+-------------------+--------+  Great Toe33                0.23                              +---------+------------------+-----+-------------------+--------+   +---------+------------------+-----+----------+-------+  Left    Lt Pressure (mmHg)IndexWaveform  Comment  +---------+------------------+-----+----------+-------+  Brachial 146                                       +---------+------------------+-----+----------+-------+  PTA     86                0.59 monophasic         +---------+------------------+-----+----------+-------+  DP      81                0.55 monophasic         +---------+------------------+-----+----------+-------+  Great Toe52                0.36                    +---------+------------------+-----+----------+-------+   +-------+-----------+-----------+------------+------------+  ABI/TBIToday's ABIToday's TBIPrevious ABIPrevious TBI  +-------+-----------+-----------+------------+------------+  Right 0.38       0.23       0.46        0.28          +-------+-----------+-----------+------------+------------+  Left  0.59       0.36       0.68        0.54          +-------+-----------+-----------+------------+------------+   Chemistry panel reviewed, creatinine 1.0     Assessment/Plan:     ETHANJAMES FONTENOT is a 73 y.o. male with CL TI with rest pain and life limiting claudication who underwent left common iliac artery  stenting in August 2019 for claudication and is now suffering from lifestyle limiting claudication and rest pain on the right with an occluded external iliac, a severely diseased right common femoral and CTO of the SFA. I offered right common femoral endarterectomy with retrograde iliac stenting, risks and benefits were reviewed as well as the possibility of a femoral-femoral bypass if we are unable to get through the external iliac occlusion.  He expressed understanding elected to proceed. Encourage smoking cessation  Will plan for surgery on 03/16/2023    Recommendations to optimize cardiovascular risk: Abstinence from all tobacco products. Blood glucose control with goal A1c < 7%. Blood pressure control with goal blood pressure < 140/90 mmHg. Lipid reduction therapy with goal LDL-C <100 mg/dL  Aspirin 81mg  PO QD.  Atorvastatin 40-80mg  PO QD (or other "high intensity" statin therapy).     [name] MD [facility]

## 2023-03-07 NOTE — Progress Notes (Unsigned)
Patient ID: Dennis Patel, male   DOB: February 07, 1950, 73 y.o.   MRN: [id number]  Reason for Consult: Routine Post Op   Referred by No ref. provider found  Subjective:     HPI  Dennis Patel is a 73 y.o. male presenting for follow-up of PAD with claudication.  He underwent diagnostic angiogram on 02/13/2023 and no intervention was able to be performed as he had severe inflow disease on the right with an occluded external iliac and a 50% right common iliac stenosis.  He returns today to discuss the possibility of surgery. Today he reports he is having continued right foot and calf pain that is interfering with his work.  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  He also reports he is beginning to have some right leg and foot pain at night.  He denies nonhealing wounds.  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   Family History  Problem Relation Age of Onset   Ovarian cancer Mother 59   Diabetes Mother    CAD Father    Diabetes Sister    Lymphoma Brother 9   Skin cancer Brother        basal and squamous cell   Ovarian cancer Maternal Grandmother    CAD Son    Diabetes Son    Heart disease Son    Ovarian cancer Maternal Great-grandmother    Past Surgical History:  Procedure Laterality Date   ABDOMINAL ANGIOGRAM N/A 03/17/2011   Procedure: ABDOMINAL ANGIOGRAM;   Surgeon: Corky Crafts, MD;  Location: [facility] CATH LAB;  Service: Cardiovascular;  Laterality: N/A;   ABDOMINAL AORTOGRAM W/LOWER EXTREMITY N/A 03/17/2017   Procedure: ABDOMINAL AORTOGRAM W/LOWER EXTREMITY;  Surgeon: Chuck Hint, MD;  Location: [facility] INVASIVE CV LAB;  Service: Cardiovascular;  Laterality: N/A;   ABDOMINAL AORTOGRAM W/LOWER EXTREMITY N/A 09/08/2017   Procedure: ABDOMINAL AORTOGRAM W/LOWER EXTREMITY;  Surgeon: Chuck Hint, MD;  Location: [facility] INVASIVE CV LAB;  Service: Cardiovascular;  Laterality: N/A;   ABDOMINAL AORTOGRAM W/LOWER EXTREMITY N/A 02/13/2023   Procedure: ABDOMINAL AORTOGRAM W/LOWER EXTREMITY;  Surgeon: [name], MD;  Location: [facility] INVASIVE CV LAB;  Service: Cardiovascular;  Laterality: N/A;   ILIAC ARTERY STENT     IR FLUORO GUIDE CV LINE RIGHT  12/07/2021   IR IMAGING GUIDED PORT INSERTION  01/30/2018   IR REMOVAL TUN ACCESS W/ PORT W/O FL MOD SED  12/07/2021   LOWER EXTREMITY ANGIOGRAM N/A 03/17/2011   Procedure: LOWER EXTREMITY ANGIOGRAM;  Surgeon: Corky Crafts, MD;  Location: [facility] CATH LAB;  Service: Cardiovascular;  Laterality: N/A;   PERIPHERAL VASCULAR INTERVENTION Left 09/08/2017   Procedure: PERIPHERAL VASCULAR INTERVENTION;  Surgeon: Chuck Hint, MD;  Location: [facility] INVASIVE  CV LAB;  Service: Cardiovascular;  Laterality: Left;  Common iliac    Short Social History:  Social History   Tobacco Use   Smoking status: Former    Current packs/day: 0.00    Types: Cigarettes    Quit date: 2005    Years since quitting: 20.1   Smokeless tobacco: Never  Substance Use Topics   Alcohol use: Never    No Known Allergies  Current Outpatient Medications  Medication Sig Dispense Refill   abiraterone acetate (ZYTIGA) 250 MG tablet Take 4 tablets (1,000 mg total) by mouth daily. Take on an empty stomach 1 hour before or 2 hours after a meal 120 tablet 9   acetaminophen (TYLENOL) 500 MG tablet Take 1,000 mg by mouth every evening.      amLODipine (NORVASC) 10 MG tablet Take 1 tablet (10 mg total) by mouth daily. 30 tablet 0   atorvastatin (LIPITOR) 10 MG tablet TAKE 1 TABLET BY MOUTH EVERY DAY 90 tablet 3   calcium-vitamin D (OSCAL WITH D) 250-125 MG-UNIT tablet Take 1 tablet by mouth in the morning.     clopidogrel (PLAVIX) 75 MG tablet Take 1 tablet (75 mg total) by mouth daily. 90 tablet 1   famotidine (PEPCID) 10 MG tablet Take 10 mg by mouth in the morning.     gabapentin (NEURONTIN) 300 MG capsule TAKE 1 CAPSULE BY MOUTH THREE TIMES A DAY (Patient taking differently: Take 300 mg by mouth 2 (two) times daily.) 90 capsule 3   hydrochlorothiazide (HYDRODIURIL) 12.5 MG tablet Take 12.5 mg by mouth every morning.     Multiple Vitamins-Minerals (ONE DAILY MULTIVITAMIN MEN PO) Take 1 tablet by mouth in the morning.     naproxen sodium (ALEVE) 220 MG tablet Take 220 mg by mouth in the morning.     POTASSIUM PO Take 2 tablets by mouth in the morning.     predniSONE (DELTASONE) 5 MG tablet TAKE 1 TABLET BY MOUTH EVERY DAY WITH BREAKFAST 90 tablet 3   traZODone (DESYREL) 50 MG tablet TAKE 1 TABLET (50 MG TOTAL) BY MOUTH AT BEDTIME AS NEEDED. FOR SLEEP (Patient taking differently: Take 50 mg by mouth at bedtime. for sleep) 90 tablet 1   No current facility-administered medications for this visit.    REVIEW OF SYSTEMS   All other systems were reviewed and are negative     Objective:  Objective   Vitals:   03/10/23 0844  BP: (!) 143/74  Pulse: 66  Resp: 20  Temp: 98.3 F (36.8 C)  SpO2: 95%  Weight: 190 lb (86.2 kg)  Height: 5\' 8"  (1.727 m)   Body mass index is 28.89 kg/m.  Physical Exam General: no acute distress Cardiac: hemodynamically stable Pulm: normal work of breathing Neuro: alert, no focal deficit Extremities: Rubor of right foot, no edema, cyanosis or wounds   Data: Reviewed diagnostic angiogram from 02/13/2023  ABI +---------+------------------+-----+-------------------+--------+  Right    Rt Pressure (mmHg)IndexWaveform           Comment   +---------+------------------+-----+-------------------+--------+  Brachial 146                                                 +---------+------------------+-----+-------------------+--------+  PTA     56                0.38 dampened monophasic          +---------+------------------+-----+-------------------+--------+  DP      49                0.34 dampened monophasic          +---------+------------------+-----+-------------------+--------+  Great Toe33                0.23                              +---------+------------------+-----+-------------------+--------+   +---------+------------------+-----+----------+-------+  Left    Lt Pressure (mmHg)IndexWaveform  Comment  +---------+------------------+-----+----------+-------+  Brachial 146                                       +---------+------------------+-----+----------+-------+  PTA     86                0.59 monophasic         +---------+------------------+-----+----------+-------+  DP      81                0.55 monophasic         +---------+------------------+-----+----------+-------+  Great Toe52                0.36                    +---------+------------------+-----+----------+-------+   +-------+-----------+-----------+------------+------------+  ABI/TBIToday's ABIToday's TBIPrevious ABIPrevious TBI  +-------+-----------+-----------+------------+------------+  Right 0.38       0.23       0.46        0.28          +-------+-----------+-----------+------------+------------+  Left  0.59       0.36       0.68        0.54          +-------+-----------+-----------+------------+------------+   Chemistry panel reviewed, creatinine 1.0     Assessment/Plan:     Dennis Patel is a 73 y.o. male with CL TI with rest pain and life limiting claudication who underwent left common iliac artery  stenting in August 2019 for claudication and is now suffering from lifestyle limiting claudication and rest pain on the right with an occluded external iliac, a severely diseased right common femoral and CTO of the SFA. I offered right common femoral endarterectomy with retrograde iliac stenting, risks and benefits were reviewed as well as the possibility of a femoral-femoral bypass if we are unable to get through the external iliac occlusion.  He expressed understanding elected to proceed. Encourage smoking cessation  Will plan for surgery on 03/16/2023    Recommendations to optimize cardiovascular risk: Abstinence from all tobacco products. Blood glucose control with goal A1c < 7%. Blood pressure control with goal blood pressure < 140/90 mmHg. Lipid reduction therapy with goal LDL-C <100 mg/dL  Aspirin 81mg  PO QD.  Atorvastatin 40-80mg  PO QD (or other "high intensity" statin therapy).     [name] MD [facility]

## 2023-03-10 ENCOUNTER — Encounter: Payer: Self-pay | Admitting: Vascular Surgery

## 2023-03-10 ENCOUNTER — Ambulatory Visit: Payer: Medicare Other | Admitting: Vascular Surgery

## 2023-03-10 ENCOUNTER — Other Ambulatory Visit: Payer: Self-pay

## 2023-03-10 VITALS — BP 143/74 | HR 66 | Temp 98.3°F | Resp 20 | Ht 68.0 in | Wt 190.0 lb

## 2023-03-10 DIAGNOSIS — I10 Essential (primary) hypertension: Secondary | ICD-10-CM

## 2023-03-10 DIAGNOSIS — E785 Hyperlipidemia, unspecified: Secondary | ICD-10-CM | POA: Diagnosis not present

## 2023-03-10 DIAGNOSIS — I70221 Atherosclerosis of native arteries of extremities with rest pain, right leg: Secondary | ICD-10-CM

## 2023-03-10 NOTE — Addendum Note (Signed)
Addended by: Carolynn Sayers on: 03/10/2023 11:56 AM   Modules accepted: Level of Service

## 2023-03-13 NOTE — Pre-Procedure Instructions (Signed)
 Surgical Instructions   Your procedure is scheduled on March 16, 2023. Report to [facility] Main Entrance "A" at 5:30 A.M., then check in with the Admitting office. Any questions or running late day of surgery: call [phone]  Questions prior to your surgery date: call [phone], Monday-Friday, 8am-4pm. If you experience any cold or flu symptoms such as cough, fever, chills, shortness of breath, etc. between now and your scheduled surgery, please notify us at the above number.     Remember:  Do not eat or drink after midnight the night before your surgery    Take these medicines the morning of surgery with A SIP OF WATER: abiraterone acetate (ZYTIGA)  amLODipine (NORVASC)  atorvastatin (LIPITOR)  famotidine (PEPCID)  gabapentin (NEURONTIN)  predniSONE (DELTASONE)    May take these medicines IF NEEDED: acetaminophen (TYLENOL)    STOP taking your clopidogrel (PLAVIX) five days prior to surgery. Your last dose will be February 21st.   One week prior to surgery, STOP taking any Aspirin (unless otherwise instructed by your surgeon) Aleve, Naproxen, Ibuprofen, Motrin, Advil, Goody's, BC's, all herbal medications, fish oil, and non-prescription vitamins.                     Do NOT Smoke (Tobacco/Vaping) for 24 hours prior to your procedure.  If you use a CPAP at night, you may bring your mask/headgear for your overnight stay.   You will be asked to remove any contacts, glasses, piercing's, hearing aid's, dentures/partials prior to surgery. Please bring cases for these items if needed.    Patients discharged the day of surgery will not be allowed to drive home, and someone needs to stay with them for 24 hours.  SURGICAL WAITING ROOM VISITATION Patients may have no more than 2 support people in the waiting area - these visitors may rotate.   Pre-op nurse will coordinate an appropriate time for 1 ADULT support person, who may not rotate, to accompany patient in pre-op.   Children under the age of 57 must have an adult with them who is not the patient and must remain in the main waiting area with an adult.  If the patient needs to stay at the hospital during part of their recovery, the visitor guidelines for inpatient rooms apply.  Please refer to the [facility] website for the visitor guidelines for any additional information.   If you received a COVID test during your pre-op visit  it is requested that you wear a mask when out in public, stay away from anyone that may not be feeling well and notify your surgeon if you develop symptoms. If you have been in contact with anyone that has tested positive in the last 10 days please notify you surgeon.      Pre-operative CHG Bathing Instructions   You can play a key role in reducing the risk of infection after surgery. Your skin needs to be as free of germs as possible. You can reduce the number of germs on your skin by washing with CHG (chlorhexidine gluconate) soap before surgery. CHG is an antiseptic soap that kills germs and continues to kill germs even after washing.   DO NOT use if you have an allergy to chlorhexidine/CHG or antibacterial soaps. If your skin becomes reddened or irritated, stop using the CHG and notify one of our RNs at [phone].              TAKE A SHOWER THE NIGHT BEFORE SURGERY AND THE DAY  OF SURGERY    Please keep in mind the following:  DO NOT shave, including legs and underarms, 48 hours prior to surgery.   You may shave your face before/day of surgery.  Place clean sheets on your bed the night before surgery Use a clean washcloth (not used since being washed) for each shower. DO NOT sleep with pet's night before surgery.  CHG Shower Instructions:  Wash your face and private area with normal soap. If you choose to wash your hair, wash first with your normal shampoo.  After you use shampoo/soap, rinse your hair and body thoroughly to remove shampoo/soap residue.  Turn the  water OFF and apply half the bottle of CHG soap to a CLEAN washcloth.  Apply CHG soap ONLY FROM YOUR NECK DOWN TO YOUR TOES (washing for 3-5 minutes)  DO NOT use CHG soap on face, private areas, open wounds, or sores.  Pay special attention to the area where your surgery is being performed.  If you are having back surgery, having someone wash your back for you may be helpful. Wait 2 minutes after CHG soap is applied, then you may rinse off the CHG soap.  Pat dry with a clean towel  Put on clean pajamas    Additional instructions for the day of surgery: DO NOT APPLY any lotions, deodorants, cologne, or perfumes.   Do not wear jewelry or makeup Do not wear nail polish, gel polish, artificial nails, or any other type of covering on natural nails (fingers and toes) Do not bring valuables to the hospital. [facility] is not responsible for valuables/personal belongings. Put on clean/comfortable clothes.  Please brush your teeth.  Ask your nurse before applying any prescription medications to the skin.

## 2023-03-14 ENCOUNTER — Encounter (HOSPITAL_COMMUNITY): Payer: Self-pay

## 2023-03-14 ENCOUNTER — Other Ambulatory Visit: Payer: Self-pay

## 2023-03-14 ENCOUNTER — Encounter (HOSPITAL_COMMUNITY)
Admission: RE | Admit: 2023-03-14 | Discharge: 2023-03-14 | Disposition: A | Discharge status: Home / Self Care | Payer: Medicare Other | Source: Ambulatory Visit | Attending: Vascular Surgery | Admitting: Vascular Surgery

## 2023-03-14 VITALS — BP 131/55 | HR 58 | Temp 98.3°F | Resp 18 | Ht 68.0 in | Wt 191.7 lb

## 2023-03-14 DIAGNOSIS — Z7902 Long term (current) use of antithrombotics/antiplatelets: Secondary | ICD-10-CM | POA: Insufficient documentation

## 2023-03-14 DIAGNOSIS — I745 Embolism and thrombosis of iliac artery: Secondary | ICD-10-CM | POA: Diagnosis not present

## 2023-03-14 DIAGNOSIS — Z79899 Other long term (current) drug therapy: Secondary | ICD-10-CM | POA: Diagnosis not present

## 2023-03-14 DIAGNOSIS — I70221 Atherosclerosis of native arteries of extremities with rest pain, right leg: Secondary | ICD-10-CM | POA: Diagnosis not present

## 2023-03-14 DIAGNOSIS — Z95820 Peripheral vascular angioplasty status with implants and grafts: Secondary | ICD-10-CM | POA: Insufficient documentation

## 2023-03-14 DIAGNOSIS — Z95828 Presence of other vascular implants and grafts: Secondary | ICD-10-CM | POA: Diagnosis not present

## 2023-03-14 DIAGNOSIS — Z7969 Long term (current) use of other immunomodulators and immunosuppressants: Secondary | ICD-10-CM | POA: Insufficient documentation

## 2023-03-14 DIAGNOSIS — Z833 Family history of diabetes mellitus: Secondary | ICD-10-CM | POA: Diagnosis not present

## 2023-03-14 DIAGNOSIS — M79661 Pain in right lower leg: Secondary | ICD-10-CM | POA: Diagnosis not present

## 2023-03-14 DIAGNOSIS — M19041 Primary osteoarthritis, right hand: Secondary | ICD-10-CM | POA: Diagnosis not present

## 2023-03-14 DIAGNOSIS — G8929 Other chronic pain: Secondary | ICD-10-CM | POA: Diagnosis not present

## 2023-03-14 DIAGNOSIS — R8290 Unspecified abnormal findings in urine: Secondary | ICD-10-CM | POA: Diagnosis not present

## 2023-03-14 DIAGNOSIS — K219 Gastro-esophageal reflux disease without esophagitis: Secondary | ICD-10-CM | POA: Diagnosis not present

## 2023-03-14 DIAGNOSIS — Z01812 Encounter for preprocedural laboratory examination: Secondary | ICD-10-CM | POA: Insufficient documentation

## 2023-03-14 DIAGNOSIS — C61 Malignant neoplasm of prostate: Secondary | ICD-10-CM | POA: Insufficient documentation

## 2023-03-14 DIAGNOSIS — C7951 Secondary malignant neoplasm of bone: Secondary | ICD-10-CM | POA: Insufficient documentation

## 2023-03-14 DIAGNOSIS — Z791 Long term (current) use of non-steroidal anti-inflammatories (NSAID): Secondary | ICD-10-CM | POA: Diagnosis not present

## 2023-03-14 DIAGNOSIS — Z87442 Personal history of urinary calculi: Secondary | ICD-10-CM | POA: Diagnosis not present

## 2023-03-14 DIAGNOSIS — Z8249 Family history of ischemic heart disease and other diseases of the circulatory system: Secondary | ICD-10-CM | POA: Diagnosis not present

## 2023-03-14 DIAGNOSIS — E785 Hyperlipidemia, unspecified: Secondary | ICD-10-CM | POA: Insufficient documentation

## 2023-03-14 DIAGNOSIS — I10 Essential (primary) hypertension: Secondary | ICD-10-CM | POA: Diagnosis not present

## 2023-03-14 DIAGNOSIS — R7303 Prediabetes: Secondary | ICD-10-CM | POA: Diagnosis not present

## 2023-03-14 DIAGNOSIS — E119 Type 2 diabetes mellitus without complications: Secondary | ICD-10-CM | POA: Diagnosis not present

## 2023-03-14 DIAGNOSIS — Z85828 Personal history of other malignant neoplasm of skin: Secondary | ICD-10-CM | POA: Diagnosis not present

## 2023-03-14 DIAGNOSIS — E7800 Pure hypercholesterolemia, unspecified: Secondary | ICD-10-CM | POA: Diagnosis not present

## 2023-03-14 DIAGNOSIS — Z7952 Long term (current) use of systemic steroids: Secondary | ICD-10-CM | POA: Diagnosis not present

## 2023-03-14 DIAGNOSIS — N400 Enlarged prostate without lower urinary tract symptoms: Secondary | ICD-10-CM | POA: Insufficient documentation

## 2023-03-14 DIAGNOSIS — Z808 Family history of malignant neoplasm of other organs or systems: Secondary | ICD-10-CM | POA: Diagnosis not present

## 2023-03-14 DIAGNOSIS — I7092 Chronic total occlusion of artery of the extremities: Secondary | ICD-10-CM | POA: Diagnosis not present

## 2023-03-14 DIAGNOSIS — I7409 Other arterial embolism and thrombosis of abdominal aorta: Secondary | ICD-10-CM | POA: Diagnosis not present

## 2023-03-14 DIAGNOSIS — Z01818 Encounter for other preprocedural examination: Secondary | ICD-10-CM

## 2023-03-14 DIAGNOSIS — Z87891 Personal history of nicotine dependence: Secondary | ICD-10-CM | POA: Insufficient documentation

## 2023-03-14 DIAGNOSIS — I70201 Unspecified atherosclerosis of native arteries of extremities, right leg: Secondary | ICD-10-CM | POA: Diagnosis not present

## 2023-03-14 HISTORY — DX: Unspecified osteoarthritis, unspecified site: M19.90

## 2023-03-14 HISTORY — DX: Unspecified malignant neoplasm of skin, unspecified: C44.90

## 2023-03-14 HISTORY — DX: Prediabetes: R73.03

## 2023-03-14 LAB — URINALYSIS, ROUTINE W REFLEX MICROSCOPIC
Bilirubin Urine: NEGATIVE
Glucose, UA: NEGATIVE mg/dL
Hgb urine dipstick: NEGATIVE
Ketones, ur: NEGATIVE mg/dL
Nitrite: POSITIVE — AB
Protein, ur: NEGATIVE mg/dL
Specific Gravity, Urine: 1.016 (ref 1.005–1.030)
pH: 5 (ref 5.0–8.0)

## 2023-03-14 LAB — CBC
HCT: 43.5 % (ref 39.0–52.0)
Hemoglobin: 15.1 g/dL (ref 13.0–17.0)
MCH: 32.1 pg (ref 26.0–34.0)
MCHC: 34.7 g/dL (ref 30.0–36.0)
MCV: 92.6 fL (ref 80.0–100.0)
Platelets: 199 10*3/uL (ref 150–400)
RBC: 4.7 MIL/uL (ref 4.22–5.81)
RDW: 12 % (ref 11.5–15.5)
WBC: 10.5 10*3/uL (ref 4.0–10.5)
nRBC: 0 % (ref 0.0–0.2)

## 2023-03-14 LAB — COMPREHENSIVE METABOLIC PANEL
ALT: 24 U/L (ref 0–44)
AST: 21 U/L (ref 15–41)
Albumin: 3.7 g/dL (ref 3.5–5.0)
Alkaline Phosphatase: 74 U/L (ref 38–126)
Anion gap: 10 (ref 5–15)
BUN: 17 mg/dL (ref 8–23)
CO2: 25 mmol/L (ref 22–32)
Calcium: 9.7 mg/dL (ref 8.9–10.3)
Chloride: 109 mmol/L (ref 98–111)
Creatinine, Ser: 1.05 mg/dL (ref 0.61–1.24)
GFR, Estimated: >60 mL/min (ref >60)
Glucose, Bld: 162 mg/dL — ABNORMAL HIGH (ref 70–99)
Potassium: 4.9 mmol/L (ref 3.5–5.1)
Sodium: 144 mmol/L (ref 135–145)
Total Bilirubin: 0.8 mg/dL (ref 0.0–1.2)
Total Protein: 6.3 g/dL — ABNORMAL LOW (ref 6.5–8.1)

## 2023-03-14 LAB — TYPE AND SCREEN
ABO/RH(D): O NEG
Antibody Screen: NEGATIVE

## 2023-03-14 LAB — PROTIME-INR
INR: 1 (ref 0.8–1.2)
Prothrombin Time: 13.2 s (ref 11.4–15.2)

## 2023-03-14 LAB — SURGICAL PCR SCREEN
MRSA, PCR: POSITIVE — AB
Staphylococcus aureus: POSITIVE — AB

## 2023-03-14 LAB — APTT: aPTT: 27 s (ref 24–36)

## 2023-03-14 NOTE — Progress Notes (Signed)
 PCP - Dr. Truett Mainland Cardiologist - Denies Oncologist - Dr. Jeanie Sewer  PPM/ICD - Denies Device Orders - n/a Rep Notified - n/a  Chest x-ray - n/a EKG - 02/13/2023 Stress Test - Denies ECHO - Denies Cardiac Cath - Denies  Sleep Study - Denies CPAP - n/a  Pt is Pre-DM  Last dose of GLP1 agonist- n/a   GLP1 instructions: n/a  Blood Thinner Instructions: Pt instructed to hold Plavix for 5 days. Last dose was February 21st. Aspirin Instructions: n/a  NPO after midnight  COVID TEST- n/a   Anesthesia review: No.   Patient denies shortness of breath, fever, cough and chest pain at PAT appointment. Pt denies any respiratory illness/infection in the last two months.   All instructions explained to the patient, with a verbal understanding of the material. Patient agrees to go over the instructions while at home for a better understanding. Patient also instructed to self quarantine after being tested for COVID-19. The opportunity to ask questions was provided.

## 2023-03-15 ENCOUNTER — Inpatient Hospital Stay: Payer: Medicare Other

## 2023-03-15 ENCOUNTER — Inpatient Hospital Stay: Payer: Medicare Other | Attending: Hematology and Oncology

## 2023-03-15 ENCOUNTER — Inpatient Hospital Stay: Payer: Medicare Other | Admitting: Hematology and Oncology

## 2023-03-15 ENCOUNTER — Other Ambulatory Visit: Payer: Self-pay

## 2023-03-15 VITALS — BP 130/79 | HR 102 | Temp 97.9°F | Resp 13 | Wt 192.4 lb

## 2023-03-15 DIAGNOSIS — C61 Malignant neoplasm of prostate: Secondary | ICD-10-CM | POA: Insufficient documentation

## 2023-03-15 DIAGNOSIS — C7951 Secondary malignant neoplasm of bone: Secondary | ICD-10-CM | POA: Insufficient documentation

## 2023-03-15 DIAGNOSIS — Z95828 Presence of other vascular implants and grafts: Secondary | ICD-10-CM

## 2023-03-15 LAB — CBC WITH DIFFERENTIAL (CANCER CENTER ONLY)
Abs Immature Granulocytes: 0.03 10*3/uL (ref 0.00–0.07)
Basophils Absolute: 0 10*3/uL (ref 0.0–0.1)
Basophils Relative: 0 %
Eosinophils Absolute: 0.1 10*3/uL (ref 0.0–0.5)
Eosinophils Relative: 2 %
HCT: 44.6 % (ref 39.0–52.0)
Hemoglobin: 15.2 g/dL (ref 13.0–17.0)
Immature Granulocytes: 0 %
Lymphocytes Relative: 14 %
Lymphs Abs: 1.3 10*3/uL (ref 0.7–4.0)
MCH: 32 pg (ref 26.0–34.0)
MCHC: 34.1 g/dL (ref 30.0–36.0)
MCV: 93.9 fL (ref 80.0–100.0)
Monocytes Absolute: 0.7 10*3/uL (ref 0.1–1.0)
Monocytes Relative: 8 %
Neutro Abs: 7 10*3/uL (ref 1.7–7.7)
Neutrophils Relative %: 76 %
Platelet Count: 186 10*3/uL (ref 150–400)
RBC: 4.75 MIL/uL (ref 4.22–5.81)
RDW: 12 % (ref 11.5–15.5)
WBC Count: 9.2 10*3/uL (ref 4.0–10.5)
nRBC: 0 % (ref 0.0–0.2)

## 2023-03-15 LAB — CMP (CANCER CENTER ONLY)
ALT: 20 U/L (ref 0–44)
AST: 16 U/L (ref 15–41)
Albumin: 4.2 g/dL (ref 3.5–5.0)
Alkaline Phosphatase: 82 U/L (ref 38–126)
Anion gap: 7 (ref 5–15)
BUN: 23 mg/dL (ref 8–23)
CO2: 29 mmol/L (ref 22–32)
Calcium: 9.6 mg/dL (ref 8.9–10.3)
Chloride: 109 mmol/L (ref 98–111)
Creatinine: 0.98 mg/dL (ref 0.61–1.24)
GFR, Estimated: >60 mL/min (ref >60)
Glucose, Bld: 167 mg/dL — ABNORMAL HIGH (ref 70–99)
Potassium: 4.2 mmol/L (ref 3.5–5.1)
Sodium: 145 mmol/L (ref 135–145)
Total Bilirubin: 0.6 mg/dL (ref 0.0–1.2)
Total Protein: 6.5 g/dL (ref 6.5–8.1)

## 2023-03-15 MED ORDER — LEUPROLIDE ACETATE (6 MONTH) 45 MG ~~LOC~~ KIT
45.0000 mg | PACK | Freq: Once | SUBCUTANEOUS | Status: AC
  Administered 2023-03-15: 45 mg via SUBCUTANEOUS
  Filled 2023-03-15: qty 45

## 2023-03-15 MED ORDER — SULFAMETHOXAZOLE-TRIMETHOPRIM 800-160 MG PO TABS: 1.0000 | ORAL_TABLET | Freq: Two times a day (BID) | ORAL | 0 refills | Status: AC

## 2023-03-15 MED ORDER — VANCOMYCIN HCL 500 MG IV SOLR: 500.0000 mg | INTRAVENOUS | Status: DC

## 2023-03-15 NOTE — Progress Notes (Signed)
 [facility] [phone]   [phone]  PROGRESS NOTE  Patient Care Team: Clovis Riley, L.August Saucer, MD (Inactive) as PCP - General (Family Medicine)  Hematological/Oncological History # Castration-Resistant Advanced Prostate Cancer  # Metastatic Spread to Bones  02/04/2017: Prostate biopsy showed a Gleason score of 4+4 = 8.  01/31/2018-04/2018: 5 cycles of Taxotere 75mg /m2  11/30/2021: last visit with Dr. Clelia Croft 03/03/2022: transfer care to Dr. Leonides Schanz   Interval History:  Dennis Patel 73 y.o. male with medical history significant for Castration-Resistant Advanced Prostate Cancer  presents for a follow up visit. The patient's last visit was on 11/30/2022. In the interim since the last visit he is continued on Eligard and [place].  On exam today Mr. Bur reports he has been well overall interim since her last visit.  He reports he has been followed by vascular surgery and is planning to have an iliac artery stent/surgery performed tomorrow.  He reports this is thought to be the root of the pain in his ankle.  He reports he is taking Zytiga faithfully 1000 mg p.o. daily with prednisone.  He reports does cause hot flashes, and occasional dizziness and headaches.  He reports overall he is tolerating it well and he has "learned to deal with it".  He reports with the hot flashes which are made it easy because he would go outside to cool off.  He reports that he is not currently having any pain in his back or bones or elsewhere in his body.  He reports his energy is strong and his appetite is good.  He also recently quit smoking more to try to help with his healing from his surgery. Overall he is willing and able to continue Lupron shots and Zytiga at this time.  He otherwise denies any fevers, chills, sweats, nausea, vomiting or diarrhea.  A full 10 point ROS is otherwise negative.  MEDICAL HISTORY:  Past Medical History:  Diagnosis Date   Arthritis    right pinky finger    Atherosclerosis    BPH (benign prostatic hyperplasia)    Cancer (HCC)    Claudication (HCC)    Elevated PSA    Family history of adverse reaction to anesthesia    ' My Brother had an allergic reaction "   History of kidney stones    Hypertension    Peripheral arterial disease (HCC)    Peripheral artery disease (HCC)    Peripheral vascular disease (HCC)    Pre-diabetes    prostate ca with bone mets dx'd 11/2017   Pure hypercholesterolemia    Skin cancer    Multiple    SURGICAL HISTORY: Past Surgical History:  Procedure Laterality Date   ABDOMINAL ANGIOGRAM N/A 03/17/2011   Procedure: ABDOMINAL ANGIOGRAM;  Surgeon: Corky Crafts, MD;  Location: [facility] CATH LAB;  Service: Cardiovascular;  Laterality: N/A;   ABDOMINAL AORTOGRAM W/LOWER EXTREMITY N/A 03/17/2017   Procedure: ABDOMINAL AORTOGRAM W/LOWER EXTREMITY;  Surgeon: Chuck Hint, MD;  Location: [facility] INVASIVE CV LAB;  Service: Cardiovascular;  Laterality: N/A;   ABDOMINAL AORTOGRAM W/LOWER EXTREMITY N/A 09/08/2017   Procedure: ABDOMINAL AORTOGRAM W/LOWER EXTREMITY;  Surgeon: Chuck Hint, MD;  Location: [facility] INVASIVE CV LAB;  Service: Cardiovascular;  Laterality: N/A;   ABDOMINAL AORTOGRAM W/LOWER EXTREMITY N/A 02/13/2023   Procedure: ABDOMINAL AORTOGRAM W/LOWER EXTREMITY;  Surgeon: [name], MD;  Location: [facility] INVASIVE CV LAB;  Service: Cardiovascular;  Laterality: N/A;   COLONOSCOPY  2022   ILIAC ARTERY STENT  IR FLUORO GUIDE CV LINE RIGHT  12/07/2021   IR IMAGING GUIDED PORT INSERTION  01/30/2018   IR REMOVAL TUN ACCESS W/ PORT W/O FL MOD SED  12/07/2021   LOWER EXTREMITY ANGIOGRAM N/A 03/17/2011   Procedure: LOWER EXTREMITY ANGIOGRAM;  Surgeon: Corky Crafts, MD;  Location: [facility] CATH LAB;  Service: Cardiovascular;  Laterality: N/A;   PERIPHERAL VASCULAR INTERVENTION Left 09/08/2017   Procedure: PERIPHERAL VASCULAR INTERVENTION;  Surgeon: Chuck Hint, MD;  Location: [facility] INVASIVE CV  LAB;  Service: Cardiovascular;  Laterality: Left;  Common iliac    SOCIAL HISTORY: Social History   Socioeconomic History   Marital status: Married    Spouse name: Not on file   Number of children: Not on file   Years of education: Not on file   Highest education level: Not on file  Occupational History   Not on file  Tobacco Use   Smoking status: Former    Current packs/day: 0.00    Types: Cigarettes    Quit date: 2005    Years since quitting: 20.1   Smokeless tobacco: Never   Tobacco comments:    4-5 cigars per day. Last cigar 2/23 and is quitting cigars  Vaping Use   Vaping status: Never Used  Substance and Sexual Activity   Alcohol use: Never    Comment: 12/09/1993 quit drinking   Drug use: Never   Sexual activity: Yes  Other Topics Concern   Not on file  Social History Narrative   ** Merged History Encounter **       ** Merged History Encounter **       Social Drivers of Corporate investment banker Strain: Not on file  Food Insecurity: Not on file  Transportation Needs: Not on file  Physical Activity: Not on file  Stress: Not on file  Social Connections: Not on file  Intimate Partner Violence: Not on file    FAMILY HISTORY: Family History  Problem Relation Age of Onset   Ovarian cancer Mother 28   Diabetes Mother    CAD Father    Diabetes Sister    Lymphoma Brother 12   Skin cancer Brother        basal and squamous cell   Ovarian cancer Maternal Grandmother    CAD Son    Diabetes Son    Heart disease Son    Ovarian cancer Maternal Great-grandmother     ALLERGIES:  has no known allergies.  MEDICATIONS:  Current Outpatient Medications  Medication Sig Dispense Refill   abiraterone acetate (ZYTIGA) 250 MG tablet Take 4 tablets (1,000 mg total) by mouth daily. Take on an empty stomach 1 hour before or 2 hours after a meal 120 tablet 9   acetaminophen (TYLENOL) 500 MG tablet Take 1,000 mg by mouth at bedtime.     amLODipine (NORVASC) 10 MG  tablet Take 1 tablet (10 mg total) by mouth daily. 30 tablet 0   atorvastatin (LIPITOR) 10 MG tablet TAKE 1 TABLET BY MOUTH EVERY DAY 90 tablet 3   calcium-vitamin D (OSCAL WITH D) 250-125 MG-UNIT tablet Take 1 tablet by mouth in the morning.     clopidogrel (PLAVIX) 75 MG tablet Take 1 tablet (75 mg total) by mouth daily. 90 tablet 1   famotidine (PEPCID) 10 MG tablet Take 10 mg by mouth in the morning.     gabapentin (NEURONTIN) 300 MG capsule TAKE 1 CAPSULE BY MOUTH THREE TIMES A DAY (Patient taking differently: Take 300 mg by mouth  2 (two) times daily.) 90 capsule 3   hydrochlorothiazide (HYDRODIURIL) 12.5 MG tablet Take 12.5 mg by mouth every morning.     Multiple Vitamins-Minerals (ONE DAILY MULTIVITAMIN MEN PO) Take 1 tablet by mouth in the morning.     naproxen sodium (ALEVE) 220 MG tablet Take 220 mg by mouth in the morning.     predniSONE (DELTASONE) 5 MG tablet TAKE 1 TABLET BY MOUTH EVERY DAY WITH BREAKFAST 90 tablet 3   traZODone (DESYREL) 50 MG tablet TAKE 1 TABLET (50 MG TOTAL) BY MOUTH AT BEDTIME AS NEEDED. FOR SLEEP (Patient taking differently: Take 100-150 mg by mouth at bedtime. for sleep) 90 tablet 1   No current facility-administered medications for this visit.   Facility-Administered Medications Ordered in Other Visits  Medication Dose Route Frequency Provider Last Rate Last Admin   leuprolide (6 Month) (ELIGARD) injection 45 mg  45 mg Subcutaneous Once [name], MD        REVIEW OF SYSTEMS:   Constitutional: ( - ) fevers, ( - )  chills , ( - ) night sweats Eyes: ( - ) blurriness of vision, ( - ) double vision, ( - ) watery eyes Ears, nose, mouth, throat, and face: ( - ) mucositis, ( - ) sore throat Respiratory: ( - ) cough, ( - ) dyspnea, ( - ) wheezes Cardiovascular: ( - ) palpitation, ( - ) chest discomfort, ( - ) lower extremity swelling Gastrointestinal:  ( - ) nausea, ( - ) heartburn, ( - ) change in bowel habits Skin: ( - ) abnormal skin  rashes Lymphatics: ( - ) new lymphadenopathy, ( - ) easy bruising Neurological: ( - ) numbness, ( - ) tingling, ( - ) new weaknesses Behavioral/Psych: ( - ) mood change, ( - ) new changes  All other systems were reviewed with the patient and are negative.  PHYSICAL EXAMINATION: ECOG PERFORMANCE STATUS: 1 - Symptomatic but completely ambulatory  Vitals:   03/15/23 0938  BP: 130/79  Pulse: (!) 102  Resp: 13  Temp: 97.9 F (36.6 C)  SpO2: 100%    Filed Weights   03/15/23 0938  Weight: 192 lb 6.4 oz (87.3 kg)     GENERAL: Well-appearing elderly Caucasian male, alert, no distress and comfortable SKIN: skin color, texture, turgor are normal, no rashes or significant lesions EYES: conjunctiva are pink and non-injected, sclera clear LUNGS: clear to auscultation and percussion with normal breathing effort HEART: regular rate & rhythm and no murmurs and no lower extremity edema Musculoskeletal: no cyanosis of digits and no clubbing  PSYCH: alert & oriented x 3, fluent speech NEURO: no focal motor/sensory deficits  LABORATORY DATA:  I have reviewed the data as listed    Latest Ref Rng & Units 03/15/2023    8:47 AM 03/14/2023    9:30 AM 02/13/2023    5:45 AM  CBC  WBC 4.0 - 10.5 K/uL 9.2  10.5    Hemoglobin 13.0 - 17.0 g/dL 96.0  45.4  09.8   Hematocrit 39.0 - 52.0 % 44.6  43.5  39.0   Platelets 150 - 400 K/uL 186  199         Latest Ref Rng & Units 03/15/2023    8:47 AM 03/14/2023    9:30 AM 02/13/2023    5:45 AM  CMP  Glucose 70 - 99 mg/dL 119  147  829   BUN 8 - 23 mg/dL 23  17  26    Creatinine 0.61 -  1.24 mg/dL 6.44  0.34  7.42   Sodium 135 - 145 mmol/L 145  144  142   Potassium 3.5 - 5.1 mmol/L 4.2  4.9  3.7   Chloride 98 - 111 mmol/L 109  109  105   CO2 22 - 32 mmol/L 29  25    Calcium 8.9 - 10.3 mg/dL 9.6  9.7    Total Protein 6.5 - 8.1 g/dL 6.5  6.3    Total Bilirubin 0.0 - 1.2 mg/dL 0.6  0.8    Alkaline Phos 38 - 126 U/L 82  74    AST 15 - 41 U/L 16  21    ALT  0 - 44 U/L 20  24     RADIOGRAPHIC STUDIES: No results found.   ASSESSMENT & PLAN JAVARIE CRISP 73 y.o. male with medical history significant for Castration-Resistant Advanced Prostate Cancer  presents for a follow up visit.  # Castration-Resistant Advanced Prostate Cancer  # Metastatic Spread to Bones  -- currently on Eligard 45mg  q 6 months, last dose on 08/2022. Next due Feb 2025 (today) -- continue Zytiga 1000 mg daily with prednisone 5 mg daily, started in May 2020  -- Labs today show white blood cell count 9.2, hemoglobin 15.2, MCV 93.9, platelets 186 with normal LFTs.   --PSA at 0.2 at last check in Nov 2024. -- Plan to have patient return to clinic in 3 months time for labs and clinic visit   # Chronic Right ankle Pain -- Follow-up after completion of docetaxel chemotherapy. -- Notable difference in the thickness of his left ankle and right ankle.  Right ankle appears considerably less robust. --Patient scheduled for vascular surgery tomorrow to address this issue.   No orders of the defined types were placed in this encounter.   All questions were answered. The patient knows to call the clinic with any problems, questions or concerns.  A total of more than 30 minutes were spent on this encounter with face-to-face time and non-face-to-face time, including preparing to see the patient, ordering tests and/or medications, counseling the patient and coordination of care as outlined above.   Ulysees Barns, MD Department of Hematology/Oncology [facility] Phone: [phone] Pager: [phone] Email: [email or web address]  03/15/2023 10:29 AM

## 2023-03-15 NOTE — Anesthesia Preprocedure Evaluation (Signed)
 Anesthesia Evaluation  Patient identified by MRN, date of birth, ID band Patient awake    Reviewed: Allergy & Precautions, H&P , NPO status , Patient's Chart, lab work & pertinent test results  History of Anesthesia Complications (+) Family history of anesthesia reaction and history of anesthetic complications (brother allergic rx with fever, uncertain history)  Airway Mallampati: II  TM Distance: >3 FB Neck ROM: Full    Dental  (+) Chipped,    Pulmonary Patient abstained from smoking., former smoker Quit smoking 2005 5 cigars/d   Pulmonary exam normal breath sounds clear to auscultation       Cardiovascular hypertension (160/64 preop), Pt. on medications + Peripheral Vascular Disease (plavix)  Normal cardiovascular exam Rhythm:Regular Rate:Normal     Neuro/Psych  PSYCHIATRIC DISORDERS  Depression    negative neurological ROS     GI/Hepatic negative GI ROS, Neg liver ROS,,,  Endo/Other  diabetes (prediabetic)    Renal/GU negative Renal ROS  negative genitourinary   Musculoskeletal  (+) Arthritis , Osteoarthritis,    Abdominal   Peds negative pediatric ROS (+)  Hematology negative hematology ROS (+) Hb 15.2, plt 186   Anesthesia Other Findings   Reproductive/Obstetrics negative OB ROS                             Anesthesia Physical Anesthesia Plan  ASA: 3  Anesthesia Plan: General   Post-op Pain Management: Tylenol PO (pre-op)*   Induction: Intravenous  PONV Risk Score and Plan: 2 and Ondansetron, Dexamethasone, Treatment may vary due to age or medical condition, Propofol infusion and TIVA  Airway Management Planned: Oral ETT  Additional Equipment: None  Intra-op Plan:   Post-operative Plan: Extubation in OR  Informed Consent: I have reviewed the patients History and Physical, chart, labs and discussed the procedure including the risks, benefits and alternatives for the  proposed anesthesia with the patient or authorized representative who has indicated his/her understanding and acceptance.     Dental advisory given  Plan Discussed with: CRNA  Anesthesia Plan Comments: (Very uncertain history of possible MH in brother- will take MH precautions)       Anesthesia Quick Evaluation

## 2023-03-15 NOTE — Progress Notes (Signed)
 Anesthesia Chart Review:  Case: [id number] Date/Time: 03/16/23 0715   Procedures:      ENDARTERECTOMY RIGHT COMMON FEMORAL (Right)     INSERTION OF ILIAC STENT (Right)   Anesthesia type: General   Pre-op diagnosis: CRITICAL LIMB ISCHEMIA OF RIGHT LOWER EXTREMITY   [facility]: [facility]OM [facility] / [facility]   Surgeons: [name], MD       DISCUSSION: Patient is a 73 year old male scheduled for the above procedure.  History includes former smoker (quit 01/18/03), PAD with claudication (left SFA atherectomy/PTA 03/17/11, left CIA stent 03/17/17, 09/08/17), pre-diabetes, HTN, hypercholesterolemia, BPH, metastatic prostate cancer (diagnosed ~ 11/2017), skin cancer, arthritis. Brother had "allergic reaction" to anesthesia.  Mr. Campanaro plans to contact his brother to clarify anesthesia reaction and can update anesthesia team on the day of surgery as needed, but he believes that reaction was something "mild". He denied known personal or family history of cholinesterase deficiency or malignant hyperthermia.   He had oncology follow-up with Dr. Leonides Schanz on 03/15/23. He noted plans for vascular surgery on 03/16/23. He is on Eligard 45 mg Q 6 months, dose given 03/15/23, and on Zytiga 1000 mg daily with prednisone 5 mg daily since May 2020 for castration-resistant advanced prostate cancer. PSA 0.2 in 11/2022, 03/15/23 result is in process.    [facility] has prescribed Bactrim DS for abnormal UA with trace leukocytes, rare bacteria, and positive nitrites.   Anesthesia team to evaluate on the day of surgery. Last Plavix reported as 03/10/23.  VS: BP (!) 131/55   Pulse (!) 58   Temp 36.8 C   Resp 18   Ht 5\' 8"  (1.727 m)   Wt 87 kg   SpO2 99%   BMI 29.15 kg/m    PROVIDERS: Debroah Loop, DO is PCP  Carolynn Sayers, MD is vascular surgeon Coralyn Pear, MD is HEM-ONC   LABS: Preoperative labs noted as well as 03/15/23 labs from the [facility].  (all labs ordered are listed, but only abnormal results are  displayed)  Labs Reviewed  SURGICAL PCR SCREEN - Abnormal; Notable for the following components:      Result Value   MRSA, PCR POSITIVE (*)    Staphylococcus aureus POSITIVE (*)    All other components within normal limits  COMPREHENSIVE METABOLIC PANEL - Abnormal; Notable for the following components:   Glucose, Bld 162 (*)    Total Protein 6.3 (*)    All other components within normal limits  URINALYSIS, ROUTINE W REFLEX MICROSCOPIC - Abnormal; Notable for the following components:   APPearance HAZY (*)    Nitrite POSITIVE (*)    Leukocytes,Ua TRACE (*)    Bacteria, UA RARE (*)    All other components within normal limits  CBC  PROTIME-INR  APTT  TYPE AND SCREEN    EKG: 02/13/23: SB at 54 bpm   CV: N/A  Past Medical History:  Diagnosis Date   Arthritis    right pinky finger   Atherosclerosis    BPH (benign prostatic hyperplasia)    Cancer (HCC)    Claudication (HCC)    Elevated PSA    Family history of adverse reaction to anesthesia    ' My Brother had an allergic reaction "   History of kidney stones    Hypertension    Peripheral arterial disease (HCC)    Peripheral artery disease (HCC)    Peripheral vascular disease (HCC)    Pre-diabetes    prostate ca with bone mets dx'd 11/2017  Pure hypercholesterolemia    Skin cancer    Multiple    Past Surgical History:  Procedure Laterality Date   ABDOMINAL ANGIOGRAM N/A 03/17/2011   Procedure: ABDOMINAL ANGIOGRAM;  Surgeon: Corky Crafts, MD;  Location: [facility] CATH LAB;  Service: Cardiovascular;  Laterality: N/A;   ABDOMINAL AORTOGRAM W/LOWER EXTREMITY N/A 03/17/2017   Procedure: ABDOMINAL AORTOGRAM W/LOWER EXTREMITY;  Surgeon: Chuck Hint, MD;  Location: [facility] INVASIVE CV LAB;  Service: Cardiovascular;  Laterality: N/A;   ABDOMINAL AORTOGRAM W/LOWER EXTREMITY N/A 09/08/2017   Procedure: ABDOMINAL AORTOGRAM W/LOWER EXTREMITY;  Surgeon: Chuck Hint, MD;  Location: [facility] INVASIVE CV LAB;  Service:  Cardiovascular;  Laterality: N/A;   ABDOMINAL AORTOGRAM W/LOWER EXTREMITY N/A 02/13/2023   Procedure: ABDOMINAL AORTOGRAM W/LOWER EXTREMITY;  Surgeon: [name], MD;  Location: [facility] INVASIVE CV LAB;  Service: Cardiovascular;  Laterality: N/A;   COLONOSCOPY  2022   ILIAC ARTERY STENT     IR FLUORO GUIDE CV LINE RIGHT  12/07/2021   IR IMAGING GUIDED PORT INSERTION  01/30/2018   IR REMOVAL TUN ACCESS W/ PORT W/O FL MOD SED  12/07/2021   LOWER EXTREMITY ANGIOGRAM N/A 03/17/2011   Procedure: LOWER EXTREMITY ANGIOGRAM;  Surgeon: Corky Crafts, MD;  Location: [facility] CATH LAB;  Service: Cardiovascular;  Laterality: N/A;   PERIPHERAL VASCULAR INTERVENTION Left 09/08/2017   Procedure: PERIPHERAL VASCULAR INTERVENTION;  Surgeon: Chuck Hint, MD;  Location: [facility] INVASIVE CV LAB;  Service: Cardiovascular;  Laterality: Left;  Common iliac    MEDICATIONS:  abiraterone acetate (ZYTIGA) 250 MG tablet   acetaminophen (TYLENOL) 500 MG tablet   amLODipine (NORVASC) 10 MG tablet   atorvastatin (LIPITOR) 10 MG tablet   calcium-vitamin D (OSCAL WITH D) 250-125 MG-UNIT tablet   clopidogrel (PLAVIX) 75 MG tablet   famotidine (PEPCID) 10 MG tablet   gabapentin (NEURONTIN) 300 MG capsule   hydrochlorothiazide (HYDRODIURIL) 12.5 MG tablet   Multiple Vitamins-Minerals (ONE DAILY MULTIVITAMIN MEN PO)   naproxen sodium (ALEVE) 220 MG tablet   predniSONE (DELTASONE) 5 MG tablet   sulfamethoxazole-trimethoprim (BACTRIM DS) 800-160 MG tablet   traZODone (DESYREL) 50 MG tablet    vancomycin (VANCOCIN) powder 500 mg    Shonna Chock, PA-C Surgical Short Stay/Anesthesiology [facility] Phone [phone] [facility] Phone [phone] 03/15/2023 2:50 PM

## 2023-03-15 NOTE — Progress Notes (Signed)
 Caprice Beaver with [facility] office made aware of patients surgical PCR result of +MRSA and +MSSA

## 2023-03-16 ENCOUNTER — Inpatient Hospital Stay (HOSPITAL_COMMUNITY): Payer: Self-pay | Admitting: Vascular Surgery

## 2023-03-16 ENCOUNTER — Encounter (HOSPITAL_COMMUNITY): Payer: Self-pay | Admitting: Vascular Surgery

## 2023-03-16 ENCOUNTER — Other Ambulatory Visit: Payer: Self-pay

## 2023-03-16 ENCOUNTER — Encounter (HOSPITAL_COMMUNITY)
Admission: RE | Disposition: A | Discharge status: Home / Self Care | Payer: Self-pay | Source: Home / Self Care | Attending: Vascular Surgery

## 2023-03-16 ENCOUNTER — Inpatient Hospital Stay (HOSPITAL_COMMUNITY)
Admission: RE | Admit: 2023-03-16 | Discharge: 2023-03-17 | DRG: 271 | Disposition: A | Discharge status: Home / Self Care | Payer: Medicare Other | Attending: Vascular Surgery | Admitting: Vascular Surgery

## 2023-03-16 ENCOUNTER — Inpatient Hospital Stay (HOSPITAL_COMMUNITY): Payer: Medicare Other

## 2023-03-16 DIAGNOSIS — G8929 Other chronic pain: Secondary | ICD-10-CM | POA: Diagnosis present

## 2023-03-16 DIAGNOSIS — Z87891 Personal history of nicotine dependence: Secondary | ICD-10-CM | POA: Diagnosis not present

## 2023-03-16 DIAGNOSIS — R7303 Prediabetes: Secondary | ICD-10-CM | POA: Diagnosis present

## 2023-03-16 DIAGNOSIS — R8290 Unspecified abnormal findings in urine: Secondary | ICD-10-CM | POA: Diagnosis present

## 2023-03-16 DIAGNOSIS — E119 Type 2 diabetes mellitus without complications: Secondary | ICD-10-CM

## 2023-03-16 DIAGNOSIS — I10 Essential (primary) hypertension: Secondary | ICD-10-CM | POA: Diagnosis present

## 2023-03-16 DIAGNOSIS — Z808 Family history of malignant neoplasm of other organs or systems: Secondary | ICD-10-CM | POA: Diagnosis not present

## 2023-03-16 DIAGNOSIS — Z8249 Family history of ischemic heart disease and other diseases of the circulatory system: Secondary | ICD-10-CM

## 2023-03-16 DIAGNOSIS — Z7902 Long term (current) use of antithrombotics/antiplatelets: Secondary | ICD-10-CM | POA: Diagnosis not present

## 2023-03-16 DIAGNOSIS — N400 Enlarged prostate without lower urinary tract symptoms: Secondary | ICD-10-CM | POA: Diagnosis present

## 2023-03-16 DIAGNOSIS — I70221 Atherosclerosis of native arteries of extremities with rest pain, right leg: Secondary | ICD-10-CM | POA: Diagnosis present

## 2023-03-16 DIAGNOSIS — Z87442 Personal history of urinary calculi: Secondary | ICD-10-CM

## 2023-03-16 DIAGNOSIS — F17290 Nicotine dependence, other tobacco product, uncomplicated: Secondary | ICD-10-CM | POA: Diagnosis present

## 2023-03-16 DIAGNOSIS — M79661 Pain in right lower leg: Secondary | ICD-10-CM | POA: Diagnosis present

## 2023-03-16 DIAGNOSIS — Z7952 Long term (current) use of systemic steroids: Secondary | ICD-10-CM | POA: Diagnosis not present

## 2023-03-16 DIAGNOSIS — C7951 Secondary malignant neoplasm of bone: Secondary | ICD-10-CM | POA: Diagnosis present

## 2023-03-16 DIAGNOSIS — I745 Embolism and thrombosis of iliac artery: Secondary | ICD-10-CM | POA: Diagnosis present

## 2023-03-16 DIAGNOSIS — Z833 Family history of diabetes mellitus: Secondary | ICD-10-CM | POA: Diagnosis not present

## 2023-03-16 DIAGNOSIS — E7800 Pure hypercholesterolemia, unspecified: Secondary | ICD-10-CM | POA: Diagnosis present

## 2023-03-16 DIAGNOSIS — I7092 Chronic total occlusion of artery of the extremities: Secondary | ICD-10-CM | POA: Diagnosis present

## 2023-03-16 DIAGNOSIS — K219 Gastro-esophageal reflux disease without esophagitis: Secondary | ICD-10-CM | POA: Diagnosis present

## 2023-03-16 DIAGNOSIS — M25571 Pain in right ankle and joints of right foot: Secondary | ICD-10-CM | POA: Diagnosis present

## 2023-03-16 DIAGNOSIS — I7409 Other arterial embolism and thrombosis of abdominal aorta: Secondary | ICD-10-CM | POA: Diagnosis present

## 2023-03-16 DIAGNOSIS — Z85828 Personal history of other malignant neoplasm of skin: Secondary | ICD-10-CM | POA: Diagnosis not present

## 2023-03-16 DIAGNOSIS — Z79899 Other long term (current) drug therapy: Secondary | ICD-10-CM

## 2023-03-16 DIAGNOSIS — C61 Malignant neoplasm of prostate: Secondary | ICD-10-CM | POA: Diagnosis present

## 2023-03-16 DIAGNOSIS — M19041 Primary osteoarthritis, right hand: Secondary | ICD-10-CM | POA: Diagnosis present

## 2023-03-16 DIAGNOSIS — Z95820 Peripheral vascular angioplasty status with implants and grafts: Secondary | ICD-10-CM

## 2023-03-16 DIAGNOSIS — Z791 Long term (current) use of non-steroidal anti-inflammatories (NSAID): Secondary | ICD-10-CM | POA: Diagnosis not present

## 2023-03-16 HISTORY — PX: PATCH ANGIOPLASTY: SHX6230

## 2023-03-16 HISTORY — PX: ENDARTERECTOMY FEMORAL: SHX5804

## 2023-03-16 HISTORY — PX: APPLICATION OF WOUND VAC: SHX5189

## 2023-03-16 HISTORY — PX: INSERTION OF ILIAC STENT: SHX6256

## 2023-03-16 LAB — PROSTATE-SPECIFIC AG, SERUM (LABCORP): Prostate Specific Ag, Serum: 0.4 ng/mL (ref 0.0–4.0)

## 2023-03-16 LAB — POCT ACTIVATED CLOTTING TIME
Activated Clotting Time: 239 s
Activated Clotting Time: 216 s
Activated Clotting Time: 216 s
Activated Clotting Time: 245 s
Activated Clotting Time: 239 s
Activated Clotting Time: 268 s
Activated Clotting Time: 112 s
Activated Clotting Time: 239 s

## 2023-03-16 LAB — CBC
HCT: 32.2 % — ABNORMAL LOW (ref 39.0–52.0)
Hemoglobin: 11.1 g/dL — ABNORMAL LOW (ref 13.0–17.0)
MCH: 32.1 pg (ref 26.0–34.0)
MCHC: 34.5 g/dL (ref 30.0–36.0)
MCV: 93.1 fL (ref 80.0–100.0)
Platelets: 147 10*3/uL — ABNORMAL LOW (ref 150–400)
RBC: 3.46 MIL/uL — ABNORMAL LOW (ref 4.22–5.81)
RDW: 11.9 % (ref 11.5–15.5)
WBC: 14.1 10*3/uL — ABNORMAL HIGH (ref 4.0–10.5)
nRBC: 0 % (ref 0.0–0.2)

## 2023-03-16 LAB — CREATININE, SERUM
Creatinine, Ser: 0.83 mg/dL (ref 0.61–1.24)
GFR, Estimated: >60 mL/min (ref >60)

## 2023-03-16 LAB — TESTOSTERONE: Testosterone: <3 ng/dL — ABNORMAL LOW (ref 264–916)

## 2023-03-16 LAB — ABO/RH: ABO/RH(D): O NEG

## 2023-03-16 SURGERY — ENDARTERECTOMY, FEMORAL
Anesthesia: General | Site: Leg Upper | Laterality: Right

## 2023-03-16 MED ORDER — SODIUM CHLORIDE 0.9 % IV SOLN: INTRAVENOUS | Status: DC

## 2023-03-16 MED ORDER — ACETAMINOPHEN 500 MG PO TABS
1000.0000 mg | ORAL_TABLET | Freq: Once | ORAL | Status: AC
  Administered 2023-03-16: 1000 mg via ORAL
  Filled 2023-03-16: qty 2

## 2023-03-16 MED ORDER — ATORVASTATIN CALCIUM 10 MG PO TABS
10.0000 mg | ORAL_TABLET | Freq: Every day | ORAL | Status: DC
  Administered 2023-03-17: 10 mg via ORAL
  Filled 2023-03-16: qty 1

## 2023-03-16 MED ORDER — LABETALOL HCL 5 MG/ML IV SOLN: 10.0000 mg | INTRAVENOUS | Status: DC | PRN

## 2023-03-16 MED ORDER — FENTANYL CITRATE (PF) 250 MCG/5ML IJ SOLN
INTRAMUSCULAR | Status: AC
  Filled 2023-03-16: qty 5

## 2023-03-16 MED ORDER — CEFAZOLIN SODIUM-DEXTROSE 2-3 GM-%(50ML) IV SOLR
INTRAVENOUS | Status: DC | PRN
  Administered 2023-03-16 (×2): 2 g via INTRAVENOUS

## 2023-03-16 MED ORDER — AMLODIPINE BESYLATE 10 MG PO TABS
10.0000 mg | ORAL_TABLET | Freq: Every day | ORAL | Status: DC
  Administered 2023-03-17: 10 mg via ORAL
  Filled 2023-03-16: qty 1

## 2023-03-16 MED ORDER — ONDANSETRON HCL 4 MG/2ML IJ SOLN
INTRAMUSCULAR | Status: AC
  Filled 2023-03-16: qty 2

## 2023-03-16 MED ORDER — ORAL CARE MOUTH RINSE: 15.0000 mL | Freq: Once | OROMUCOSAL | Status: AC

## 2023-03-16 MED ORDER — OXYCODONE-ACETAMINOPHEN 5-325 MG PO TABS
1.0000 | ORAL_TABLET | ORAL | Status: DC | PRN
  Administered 2023-03-16 – 2023-03-17 (×2): 1 via ORAL
  Filled 2023-03-16 (×2): qty 1

## 2023-03-16 MED ORDER — PHENYLEPHRINE 80 MCG/ML (10ML) SYRINGE FOR IV PUSH (FOR BLOOD PRESSURE SUPPORT)
PREFILLED_SYRINGE | INTRAVENOUS | Status: DC | PRN
  Administered 2023-03-16: 80 ug via INTRAVENOUS

## 2023-03-16 MED ORDER — PROPOFOL 10 MG/ML IV BOLUS
INTRAVENOUS | Status: AC
  Filled 2023-03-16: qty 20

## 2023-03-16 MED ORDER — HEPARIN SODIUM (PORCINE) 1000 UNIT/ML IJ SOLN
INTRAMUSCULAR | Status: AC
  Filled 2023-03-16: qty 10

## 2023-03-16 MED ORDER — HYDROMORPHONE HCL 1 MG/ML IJ SOLN
INTRAMUSCULAR | Status: AC
  Filled 2023-03-16: qty 0.5

## 2023-03-16 MED ORDER — GABAPENTIN 300 MG PO CAPS
300.0000 mg | ORAL_CAPSULE | Freq: Two times a day (BID) | ORAL | Status: DC
  Administered 2023-03-16 – 2023-03-17 (×2): 300 mg via ORAL
  Filled 2023-03-16 (×2): qty 1

## 2023-03-16 MED ORDER — MORPHINE SULFATE (PF) 2 MG/ML IV SOLN: 2.0000 mg | INTRAVENOUS | Status: DC | PRN

## 2023-03-16 MED ORDER — GUAIFENESIN-DM 100-10 MG/5ML PO SYRP: 15.0000 mL | ORAL_SOLUTION | ORAL | Status: DC | PRN

## 2023-03-16 MED ORDER — LIDOCAINE 2% (20 MG/ML) 5 ML SYRINGE
INTRAMUSCULAR | Status: DC | PRN
  Administered 2023-03-16: 60 mg via INTRAVENOUS

## 2023-03-16 MED ORDER — METOPROLOL TARTRATE 5 MG/5ML IV SOLN: 2.0000 mg | INTRAVENOUS | Status: DC | PRN

## 2023-03-16 MED ORDER — CLOPIDOGREL BISULFATE 75 MG PO TABS
75.0000 mg | ORAL_TABLET | Freq: Every day | ORAL | Status: DC
  Administered 2023-03-17: 75 mg via ORAL
  Filled 2023-03-16: qty 1

## 2023-03-16 MED ORDER — SUGAMMADEX SODIUM 200 MG/2ML IV SOLN
INTRAVENOUS | Status: AC
  Filled 2023-03-16: qty 2

## 2023-03-16 MED ORDER — ABIRATERONE ACETATE 250 MG PO TABS: 1000.0000 mg | ORAL_TABLET | Freq: Every day | ORAL | Status: DC

## 2023-03-16 MED ORDER — LIDOCAINE 2% (20 MG/ML) 5 ML SYRINGE
INTRAMUSCULAR | Status: AC
  Filled 2023-03-16: qty 5

## 2023-03-16 MED ORDER — VANCOMYCIN HCL IN DEXTROSE 1-5 GM/200ML-% IV SOLN
1000.0000 mg | INTRAVENOUS | Status: DC
  Administered 2023-03-16: 1000 mg via INTRAVENOUS
  Filled 2023-03-16: qty 200

## 2023-03-16 MED ORDER — SULFAMETHOXAZOLE-TRIMETHOPRIM 800-160 MG PO TABS
1.0000 | ORAL_TABLET | Freq: Two times a day (BID) | ORAL | Status: DC
  Administered 2023-03-17: 1 via ORAL
  Filled 2023-03-16: qty 1

## 2023-03-16 MED ORDER — SODIUM CHLORIDE 0.9 % IV SOLN: 500.0000 mL | Freq: Once | INTRAVENOUS | Status: DC | PRN

## 2023-03-16 MED ORDER — IODIXANOL 320 MG/ML IV SOLN
INTRAVENOUS | Status: DC | PRN
  Administered 2023-03-16: 65 mL

## 2023-03-16 MED ORDER — PHENYLEPHRINE HCL-NACL 20-0.9 MG/250ML-% IV SOLN
INTRAVENOUS | Status: DC | PRN
  Administered 2023-03-16: 35 ug/min via INTRAVENOUS

## 2023-03-16 MED ORDER — ROCURONIUM BROMIDE 10 MG/ML (PF) SYRINGE
PREFILLED_SYRINGE | INTRAVENOUS | Status: AC
  Filled 2023-03-16: qty 10

## 2023-03-16 MED ORDER — PHENOL 1.4 % MT LIQD: 1.0000 | OROMUCOSAL | Status: DC | PRN

## 2023-03-16 MED ORDER — CHLORHEXIDINE GLUCONATE CLOTH 2 % EX PADS: 6.0000 | MEDICATED_PAD | Freq: Once | CUTANEOUS | Status: DC

## 2023-03-16 MED ORDER — PROPOFOL 1000 MG/100ML IV EMUL
INTRAVENOUS | Status: AC
Start: 2023-03-16 — End: ?
  Filled 2023-03-16: qty 100

## 2023-03-16 MED ORDER — ACETAMINOPHEN 325 MG PO TABS
325.0000 mg | ORAL_TABLET | ORAL | Status: DC | PRN
  Administered 2023-03-16: 650 mg via ORAL
  Filled 2023-03-16: qty 2

## 2023-03-16 MED ORDER — CEFAZOLIN SODIUM-DEXTROSE 2-4 GM/100ML-% IV SOLN
2.0000 g | Freq: Three times a day (TID) | INTRAVENOUS | Status: AC
  Administered 2023-03-16 – 2023-03-17 (×2): 2 g via INTRAVENOUS
  Filled 2023-03-16 (×2): qty 100

## 2023-03-16 MED ORDER — VANCOMYCIN HCL 1250 MG/250ML IV SOLN
1250.0000 mg | Freq: Once | INTRAVENOUS | Status: AC
  Administered 2023-03-16: 1250 mg via INTRAVENOUS
  Filled 2023-03-16: qty 250

## 2023-03-16 MED ORDER — GLYCOPYRROLATE PF 0.2 MG/ML IJ SOSY
PREFILLED_SYRINGE | INTRAMUSCULAR | Status: AC
  Filled 2023-03-16: qty 1

## 2023-03-16 MED ORDER — CEFAZOLIN SODIUM-DEXTROSE 2-4 GM/100ML-% IV SOLN
2.0000 g | INTRAVENOUS | Status: DC
  Filled 2023-03-16: qty 100

## 2023-03-16 MED ORDER — ACETAMINOPHEN 650 MG RE SUPP: 325.0000 mg | RECTAL | Status: DC | PRN

## 2023-03-16 MED ORDER — TRAZODONE HCL 100 MG PO TABS
100.0000 mg | ORAL_TABLET | Freq: Every day | ORAL | Status: DC
  Administered 2023-03-16: 100 mg via ORAL
  Filled 2023-03-16: qty 1

## 2023-03-16 MED ORDER — PROPOFOL 500 MG/50ML IV EMUL
INTRAVENOUS | Status: DC | PRN
  Administered 2023-03-16: 90 ug/kg/min via INTRAVENOUS

## 2023-03-16 MED ORDER — POTASSIUM CHLORIDE CRYS ER 20 MEQ PO TBCR: 20.0000 meq | EXTENDED_RELEASE_TABLET | Freq: Every day | ORAL | Status: DC | PRN

## 2023-03-16 MED ORDER — FENTANYL CITRATE (PF) 250 MCG/5ML IJ SOLN
INTRAMUSCULAR | Status: DC | PRN
Start: 2023-03-16 — End: 2023-03-16
  Administered 2023-03-16: 100 ug via INTRAVENOUS
  Administered 2023-03-16: 50 ug via INTRAVENOUS
  Administered 2023-03-16: 100 ug via INTRAVENOUS

## 2023-03-16 MED ORDER — MICROFIBRILLAR COLL HEMOSTAT EX PADS
MEDICATED_PAD | CUTANEOUS | Status: DC | PRN
  Administered 2023-03-16: 1 via TOPICAL

## 2023-03-16 MED ORDER — HEMOSTATIC AGENTS (NO CHARGE) OPTIME
TOPICAL | Status: DC | PRN
  Administered 2023-03-16: 1 via TOPICAL

## 2023-03-16 MED ORDER — HYDRALAZINE HCL 20 MG/ML IJ SOLN: 5.0000 mg | INTRAMUSCULAR | Status: DC | PRN

## 2023-03-16 MED ORDER — PANTOPRAZOLE SODIUM 40 MG PO TBEC
40.0000 mg | DELAYED_RELEASE_TABLET | Freq: Every day | ORAL | Status: DC
  Administered 2023-03-17: 40 mg via ORAL
  Filled 2023-03-16: qty 1

## 2023-03-16 MED ORDER — HYDROCHLOROTHIAZIDE 12.5 MG PO TABS
12.5000 mg | ORAL_TABLET | Freq: Every morning | ORAL | Status: DC
  Administered 2023-03-16 – 2023-03-17 (×2): 12.5 mg via ORAL
  Filled 2023-03-16 (×2): qty 1

## 2023-03-16 MED ORDER — HYDROMORPHONE HCL 1 MG/ML IJ SOLN
INTRAMUSCULAR | Status: DC | PRN
Start: 2023-03-16 — End: 2023-03-16
  Administered 2023-03-16: .5 mg via INTRAVENOUS

## 2023-03-16 MED ORDER — 0.9 % SODIUM CHLORIDE (POUR BTL) OPTIME
TOPICAL | Status: DC | PRN
  Administered 2023-03-16: 2000 mL

## 2023-03-16 MED ORDER — ROCURONIUM BROMIDE 10 MG/ML (PF) SYRINGE
PREFILLED_SYRINGE | INTRAVENOUS | Status: DC | PRN
  Administered 2023-03-16: 100 mg via INTRAVENOUS
  Administered 2023-03-16: 20 mg via INTRAVENOUS
  Administered 2023-03-16 (×2): 30 mg via INTRAVENOUS
  Administered 2023-03-16 (×2): 20 mg via INTRAVENOUS

## 2023-03-16 MED ORDER — GLYCOPYRROLATE PF 0.2 MG/ML IJ SOSY
PREFILLED_SYRINGE | INTRAMUSCULAR | Status: DC | PRN
  Administered 2023-03-16: .2 mg via INTRAVENOUS

## 2023-03-16 MED ORDER — PROTAMINE SULFATE 10 MG/ML IV SOLN
INTRAVENOUS | Status: DC | PRN
  Administered 2023-03-16 (×2): 20 mg via INTRAVENOUS
  Administered 2023-03-16 (×3): 10 mg via INTRAVENOUS

## 2023-03-16 MED ORDER — DOCUSATE SODIUM 100 MG PO CAPS
100.0000 mg | ORAL_CAPSULE | Freq: Every day | ORAL | Status: DC
  Filled 2023-03-16: qty 1

## 2023-03-16 MED ORDER — SUGAMMADEX SODIUM 200 MG/2ML IV SOLN
INTRAVENOUS | Status: DC | PRN
Start: 2023-03-16 — End: 2023-03-16
  Administered 2023-03-16: 190 mg via INTRAVENOUS

## 2023-03-16 MED ORDER — HEPARIN SODIUM (PORCINE) 1000 UNIT/ML IJ SOLN
INTRAMUSCULAR | Status: DC | PRN
  Administered 2023-03-16 (×2): 1000 [IU] via INTRAVENOUS
  Administered 2023-03-16: 8000 [IU] via INTRAVENOUS
  Administered 2023-03-16: 3000 [IU] via INTRAVENOUS
  Administered 2023-03-16: 2000 [IU] via INTRAVENOUS

## 2023-03-16 MED ORDER — CHLORHEXIDINE GLUCONATE 0.12 % MT SOLN
15.0000 mL | Freq: Once | OROMUCOSAL | Status: AC
  Administered 2023-03-16: 15 mL via OROMUCOSAL
  Filled 2023-03-16: qty 15

## 2023-03-16 MED ORDER — ONDANSETRON HCL 4 MG/2ML IJ SOLN
INTRAMUSCULAR | Status: DC | PRN
  Administered 2023-03-16: 4 mg via INTRAVENOUS

## 2023-03-16 MED ORDER — PROTAMINE SULFATE 10 MG/ML IV SOLN
INTRAVENOUS | Status: AC
  Filled 2023-03-16: qty 5

## 2023-03-16 MED ORDER — HEPARIN SODIUM (PORCINE) 5000 UNIT/ML IJ SOLN
5000.0000 [IU] | Freq: Three times a day (TID) | INTRAMUSCULAR | Status: DC
  Administered 2023-03-17: 5000 [IU] via SUBCUTANEOUS
  Filled 2023-03-16: qty 1

## 2023-03-16 MED ORDER — ONDANSETRON HCL 4 MG/2ML IJ SOLN: 4.0000 mg | Freq: Four times a day (QID) | INTRAMUSCULAR | Status: DC | PRN

## 2023-03-16 MED ORDER — ALUM & MAG HYDROXIDE-SIMETH 200-200-20 MG/5ML PO SUSP: 15.0000 mL | ORAL | Status: DC | PRN

## 2023-03-16 MED ORDER — MAGNESIUM SULFATE 2 GM/50ML IV SOLN: 2.0000 g | Freq: Every day | INTRAVENOUS | Status: DC | PRN

## 2023-03-16 MED ORDER — ACETAMINOPHEN 500 MG PO TABS: 1000.0000 mg | ORAL_TABLET | Freq: Once | ORAL | Status: AC

## 2023-03-16 MED ORDER — HEPARIN 6000 UNIT IRRIGATION SOLUTION
Status: DC | PRN
  Administered 2023-03-16: 1

## 2023-03-16 MED ORDER — LACTATED RINGERS IV SOLN: INTRAVENOUS | Status: DC

## 2023-03-16 MED ORDER — PROPOFOL 10 MG/ML IV BOLUS
INTRAVENOUS | Status: DC | PRN
  Administered 2023-03-16: 100 mg via INTRAVENOUS

## 2023-03-16 MED ORDER — ROCURONIUM BROMIDE 10 MG/ML (PF) SYRINGE
PREFILLED_SYRINGE | INTRAVENOUS | Status: AC
  Filled 2023-03-16: qty 20

## 2023-03-16 SURGICAL SUPPLY — 79 items
BAG BANDED W/RUBBER/TAPE 36X54 (MISCELLANEOUS) IMPLANT
BAG COUNTER SPONGE SURGICOUNT (BAG) ×2 IMPLANT
BALLN MUSTANG 4X100X135 (BALLOONS) ×2 IMPLANT
BALLN MUSTANG 7X80X75 (BALLOONS) ×2 IMPLANT
BALLOON MUSTANG 4X100X135 (BALLOONS) IMPLANT
BALLOON MUSTANG 7X80X75 (BALLOONS) IMPLANT
BENZOIN TINCTURE PRP APPL 2/3 (GAUZE/BANDAGES/DRESSINGS) IMPLANT
CANISTER SUCT 3000ML PPV (MISCELLANEOUS) ×2 IMPLANT
CATH ANGIO 5F BER2 65CM (CATHETERS) IMPLANT
CATH BEACON 5 .035 65 KMP TIP (CATHETERS) IMPLANT
CATH EMB 5FR 80CM (CATHETERS) IMPLANT
CATH OMNI FLUSH .035X70CM (CATHETERS) IMPLANT
CATH OMNI FLUSH 5F 65CM (CATHETERS) ×2 IMPLANT
CATH QUICKCROSS SUPP .035X90CM (MICROCATHETER) IMPLANT
CHLORAPREP W/TINT 26 (MISCELLANEOUS) ×2 IMPLANT
CLIP TI MEDIUM 24 (CLIP) ×2 IMPLANT
CLIP TI MEDIUM 6 (CLIP) ×2 IMPLANT
CLIP TI WIDE RED SMALL 24 (CLIP) ×2 IMPLANT
CLIP TI WIDE RED SMALL 6 (CLIP) ×2 IMPLANT
COVER DOME SNAP 22 D (MISCELLANEOUS) IMPLANT
COVER ULTRASOUND PROBE 36 ST (MISCELLANEOUS) IMPLANT
DERMABOND ADVANCED .7 DNX12 (GAUZE/BANDAGES/DRESSINGS) ×2 IMPLANT
DERMABOND ADVANCED .7 DNX6 (GAUZE/BANDAGES/DRESSINGS) IMPLANT
DEVICE TORQUE SEADRAGON GRN (MISCELLANEOUS) IMPLANT
DRAIN CHANNEL 15F RND FF W/TCR (WOUND CARE) IMPLANT
DRAPE C-ARM 42X72 X-RAY (DRAPES) IMPLANT
DRAPE INCISE IOBAN 66X45 STRL (DRAPES) IMPLANT
DRESSING PEEL AND PLC PRVNA 13 (GAUZE/BANDAGES/DRESSINGS) IMPLANT
DRSG PEEL AND PLACE PREVENA 13 (GAUZE/BANDAGES/DRESSINGS) ×2 IMPLANT
ELECT REM PT RETURN 9FT ADLT (ELECTROSURGICAL) ×2 IMPLANT
ELECTRODE REM PT RTRN 9FT ADLT (ELECTROSURGICAL) ×2 IMPLANT
EVACUATOR SILICONE 100CC (DRAIN) IMPLANT
GAUZE 4X4 16PLY ~~LOC~~+RFID DBL (SPONGE) IMPLANT
GEL ULTRASOUND 20GR AQUASONIC (MISCELLANEOUS) IMPLANT
GLIDEWIRE ADV .035X180CM (WIRE) IMPLANT
GLOVE BIOGEL PI IND STRL 7.0 (GLOVE) ×2 IMPLANT
GOWN STRL REUS W/ TWL LRG LVL3 (GOWN DISPOSABLE) ×4 IMPLANT
GOWN STRL REUS W/ TWL XL LVL3 (GOWN DISPOSABLE) ×2 IMPLANT
GUIDEWIRE ANGLED .035X150CM (WIRE) IMPLANT
HEMOSTAT SNOW SURGICEL 2X4 (HEMOSTASIS) IMPLANT
KIT BASIN OR (CUSTOM PROCEDURE TRAY) ×2 IMPLANT
KIT DRSG PREVENA PLUS 7DAY 125 (MISCELLANEOUS) IMPLANT
KIT ENCORE 26 ADVANTAGE (KITS) IMPLANT
KIT TURNOVER KIT B (KITS) ×2 IMPLANT
LOOP VESSEL MINI RED (MISCELLANEOUS) IMPLANT
NS IRRIG 1000ML POUR BTL (IV SOLUTION) ×4 IMPLANT
PACK ENDO MINOR (CUSTOM PROCEDURE TRAY) ×2 IMPLANT
PACK PERIPHERAL VASCULAR (CUSTOM PROCEDURE TRAY) ×2 IMPLANT
PAD ARMBOARD 7.5X6 YLW CONV (MISCELLANEOUS) ×4 IMPLANT
PATCH VASC XENOSURE 1X6 (Vascular Products) IMPLANT
SET MICROPUNCTURE 5F STIFF (MISCELLANEOUS) IMPLANT
SET WALTER ACTIVATION W/DRAPE (SET/KITS/TRAYS/PACK) IMPLANT
SHEATH BRITE TIP 7FRX11 (SHEATH) IMPLANT
SHEATH GUIDING 7F 55X73X9MM TD (SHEATH) IMPLANT
SHEATH HIGHFLEX ANSEL 7FR 55CM (SHEATH) IMPLANT
SHEATH PINNACLE 5F 10CM (SHEATH) IMPLANT
SHEATH PINNACLE 8F 10CM (SHEATH) ×2 IMPLANT
STENT VIABAHN 7X7.5X120 (Permanent Stent) IMPLANT
STENT ZILVER PTX 8X60 (Permanent Stent) IMPLANT
STOPCOCK 4 WAY LG BORE MALE ST (IV SETS) IMPLANT
SUT MNCRL AB 4-0 PS2 18 (SUTURE) ×2 IMPLANT
SUT PROLENE 5 0 C 1 24 (SUTURE) ×2 IMPLANT
SUT PROLENE 5 0 C 1 36 (SUTURE) IMPLANT
SUT PROLENE 6 0 BV (SUTURE) ×2 IMPLANT
SUT VIC AB 2-0 CT1 TAPERPNT 27 (SUTURE) ×2 IMPLANT
SUT VIC AB 3-0 SH 27X BRD (SUTURE) ×2 IMPLANT
SYR 10ML LL (SYRINGE) IMPLANT
SYR 20ML LL LF (SYRINGE) IMPLANT
SYR 3ML LL SCALE MARK (SYRINGE) IMPLANT
SYR MEDRAD MARK V 150ML (SYRINGE) ×2 IMPLANT
TOWEL GREEN STERILE (TOWEL DISPOSABLE) ×4 IMPLANT
TOWEL GREEN STERILE FF (TOWEL DISPOSABLE) ×2 IMPLANT
TRAY FOLEY MTR SLVR 16FR STAT (SET/KITS/TRAYS/PACK) IMPLANT
UNDERPAD 30X36 HEAVY ABSORB (UNDERPADS AND DIAPERS) ×2 IMPLANT
WATER STERILE IRR 1000ML POUR (IV SOLUTION) ×2 IMPLANT
WIRE AMPLATZ SS-J .035X180CM (WIRE) IMPLANT
WIRE BENTSON .035X145CM (WIRE) ×2 IMPLANT
WIRE G V18X300 ST (WIRE) IMPLANT
WIRE TORQFLEX AUST .018X40CM (WIRE) IMPLANT

## 2023-03-16 NOTE — Transfer of Care (Signed)
 Immediate Anesthesia Transfer of Care Note  Patient: Dennis Patel  Procedure(s) Performed: ENDARTERECTOMY RIGHT COMMON FEMORAL (Right: Leg Upper) INSERTION OF RIGHT COMMON ILIAC ARTERY AND RIGHT COMMON FEMORAL ARTERY STENT (Right: Leg Upper) PATCH ANGIOPLASTY RIGHT FEMORAL ARTERY USING XENOSURE BIOLOGIC PATCH (Right: Leg Upper) APPLICATION OF WOUND VAC - PREVENA (Right: Groin)  Patient Location: PACU  Anesthesia Type:General  Level of Consciousness: awake, drowsy, patient cooperative, and responds to stimulation  Airway & Oxygen Therapy: Patient Spontanous Breathing  Post-op Assessment: Report given to RN and Post -op Vital signs reviewed and stable  Post vital signs: Reviewed and stable  Last Vitals:  Vitals Value Taken Time  BP    Temp    Pulse 52   Resp 16   SpO2 95%     Last Pain:  Vitals:   03/16/23 0656  TempSrc:   PainSc: 0-No pain         Complications: No notable events documented.

## 2023-03-16 NOTE — Op Note (Signed)
 OPERATIVE NOTE  PROCEDURE:   Right iliofemoral endarterectomy Right profunda and SFA endarterectomy Ultrasound-guided access of left common femoral artery Third order cannulation of right profunda artery Stenting of the right external and common iliac arteries (7 mm x 7.5 cm Viabahn, 8 mm x 60 mm Zilver PTX)  PRE-OPERATIVE DIAGNOSIS: CLTI with rest pain  POST-OPERATIVE DIAGNOSIS: same as above   SURGEON: [name] MD  ASSISTANT(S): Aggie Moats, PA  Given the complexity of the case,  the assistant was necessary in order to expedient the procedure and safely perform the technical aspects of the operation.  The assistant provided traction and countertraction to assist with exposure of the artery proximally and distally.  They assisted with suture ligature of multiple branches.  Their assistance was critical in the performance of the anastomosis.These skills, especially following the Prolene suture for the anastomosis, could not have been adequately performed by a scrub tech assistant.   ANESTHESIA: general  ESTIMATED BLOOD LOSS: 200 cc  FINDING(S): Severe calcific stenosis of the Right common femoral, profunda and SFA arteries Chronic total occlusion of the right external iliac, severe calcific stenosis of the right internal iliac Chronic total occlusion of the right SFA Palpable pulse in right common femoral and profunda at conclusion Multiphasic signal in the right profunda and right PT at conclusion   SPECIMEN(S): None  INDICATIONS:   Dennis Patel is a 73 y.o. male with with right critical limb threatening ischemia with rest pain.  He underwent angiogram about 2 weeks ago which demonstrated an occluded right external iliac and a severely diseased and calcific right common femoral artery with a CTO of the SFA.  Of note he did undergo a left common iliac artery stenting in August 2029 for claudication, the stent is widely patent and he denies symptoms on the left leg.   We discussed the risks and benefits of right femoral endarterectomy with retrograde iliac stenting with the possibility of femoral-femoral bypass if we are unable to cross the right external iliac occlusion.  He expressed understanding and elected to proceed.  DESCRIPTION: After full informed written consent was obtained, the patient was brought back to the operating room and placed supine upon the operating table.  Prior to induction, the patient was given intravenous antibiotics.  After obtaining adequate anesthesia, the was prepped and draped in the standard fashion for a femoral endarterectomy.  Ultrasound was first used to map the right common femoral artery and plan the incision. A longitudinal incision was made overlying the common femoral artery  Using blunt dissection and electrocautery, the artery was dissected out from the inguinal ligament down to the femoral bifurcation, crossing venous branches were ligated and transected between silk ties. The superficial femoral artery, profunda femoral artery, and external iliac artery were dissected circumferentially and vessel loops applied.  Circumflex branches were controlled with silk ties.  This common femoral artery was found on exam to be severely diseased and calcific.  The patient was then systemically heparinized and after waiting three minutes, the external iliac, profunda and superficial femoral arteries were clamped.  An arteriotomy was made with an 11 blade and extended proximally and distally with a Potts scissor.  The endarterectomy was started with a Freer elevator in the midportion of the femoral artery and the plaque was transected in this area.  The endarterectomy was then continued distally into the profunda.  In order to obtain an adequate endpoint the first branches of the profunda were dissected free and controlled  with Silastic Vesseloops.  An eversion endarterectomy was performed and there appeared to be excellent backbleeding from  both profunda branches.  An eversion endarterectomy was then performed of the SFA which was chronically occluded.  Approximately the plaque was freed from artery circumferentially with an elevator and using a tonsil an long segment of the plaque was removed from the external iliac artery.  The endarterectomy site was then copiously flushed with heparinized saline and endpoint in the profunda was inspected for any residual plaque or dissection flaps.  A bovine pericardial patch was brought onto the field and soaked for 3 minutes. Once pleased with the endarterectomy, site the patch was fashioned to the artery with a running stitch of 5-0 prolene.  All vessels were forward flushed and backbled prior to completion of the anastomosis.  Attention was then turned to the endovascular portion for iliac stenting.  The patch was accessed with a microwire and this was upsized to a short 7 [place] sheath.  I attempted to cross the iliac occlusion with a glide advantage wire and a quick cross catheter although I was unable to enter the true lumen in the common iliac artery from a retrograde approach.  For this reason the left common femoral artery was accessed under ultrasound guidance and I was ultimately able to cross the chronic external iliac artery occlusion with a tour guide sheath over the bifurcation, a glide advantage wire and a quick cross catheter.  The wire was passed into our endarterectomy site and into the right profunda femoral artery.  This wire was then exchanged for a V18 wire and a 7 mm x 7.5 cm Viabahn stent was deployed in the external iliac care to keep the distal stent edge above the joint space.  An 8 mm x 60 mm Zilver PTX stent was then deployed across the iliac bifurcation and into the common iliac artery. This stent was deployed with a about a 15 mm overlap of the previous placed Viabahn stent.  The stents were then postdilated with a 7 mm balloon.  Completion angiography demonstrated wide patency  and brisk flow through the stents with a widely patent right common femoral endarterectomy site and brisk flow into the profunda.  At this point, the incision was washed out and a hemostatic agent was placed around the anastomosis.  A few repair stitches were placed for hemostasis.  Doppler examination demonstrated an excellent multiphasic signal in the right common femoral artery and profunda. The groin incision was closed in layers with 2-0 and 3-0 Vicryl and 4-0 Monocryl for the skin, and a Praveena was placed.  Heparin was reversed and the 7 French sheath in the left common femoral was pulled and manual pressure held for approximately 25 minutes and hemostasis was achieved.  All counts were correct at the end of the case.  The patient tolerated the procedure well was brought to PACU in stable condition.      COMPLICATIONS: None apparent  CONDITION: Stable  [name] MD [facility] Phone Number: [phone] 03/16/2023 1:20 PM

## 2023-03-16 NOTE — Anesthesia Postprocedure Evaluation (Signed)
 Anesthesia Post Note  Patient: Dennis Patel  Procedure(s) Performed: ENDARTERECTOMY RIGHT COMMON FEMORAL (Right: Leg Upper) INSERTION OF RIGHT COMMON ILIAC ARTERY AND RIGHT COMMON FEMORAL ARTERY STENT (Right: Leg Upper) PATCH ANGIOPLASTY RIGHT FEMORAL ARTERY USING XENOSURE BIOLOGIC PATCH (Right: Leg Upper) APPLICATION OF WOUND VAC - PREVENA (Right: Groin)     Patient location during evaluation: PACU Anesthesia Type: General Level of consciousness: awake and alert, oriented and patient cooperative Pain management: pain level controlled Vital Signs Assessment: post-procedure vital signs reviewed and stable Respiratory status: spontaneous breathing, nonlabored ventilation and respiratory function stable Cardiovascular status: blood pressure returned to baseline and stable Postop Assessment: no apparent nausea or vomiting Anesthetic complications: no   No notable events documented.  Last Vitals:  Vitals:   03/16/23 1315 03/16/23 1347  BP: (!) 124/58 (!) 147/63  Pulse: (!) 53 (!) 58  Resp: 16 19  Temp: 37.1 C 36.7 C  SpO2: 96% 99%    Last Pain:  Vitals:   03/16/23 1347  TempSrc: Axillary  PainSc:                  Lannie Fields

## 2023-03-16 NOTE — Anesthesia Procedure Notes (Signed)
 Arterial Line Insertion Start/End2/27/2025 7:50 AM, 03/16/2023 7:52 AM Performed by: Lannie Fields, DO, anesthesiologist  Patient location: Pre-op. Preanesthetic checklist: patient identified, IV checked, site marked, risks and benefits discussed, surgical consent, monitors and equipment checked, pre-op evaluation, timeout performed and anesthesia consent Lidocaine 1% used for infiltration Left, radial was placed Catheter size: 20 G Hand hygiene performed , maximum sterile barriers used  and Seldinger technique used Allen's test indicative of satisfactory collateral circulation Attempts: 1 Procedure performed without using ultrasound guided technique. Following insertion, dressing applied. Post procedure assessment: normal and unchanged  Patient tolerated the procedure well with no immediate complications.

## 2023-03-16 NOTE — Anesthesia Procedure Notes (Signed)
 Procedure Name: Intubation Date/Time: 03/16/2023 7:48 AM  Performed by: Ayesha Rumpf, CRNAPre-anesthesia Checklist: Patient identified, Emergency Drugs available, Suction available and Patient being monitored Patient Re-evaluated:Patient Re-evaluated prior to induction Oxygen Delivery Method: Circle System Utilized Preoxygenation: Pre-oxygenation with 100% oxygen Induction Type: IV induction Ventilation: Mask ventilation without difficulty and Oral airway inserted - appropriate to patient size Laryngoscope Size: Mac and 4 Grade View: Grade II Tube type: Oral Tube size: 7.5 mm Number of attempts: 1 Airway Equipment and Method: Stylet and Oral airway Placement Confirmation: ETT inserted through vocal cords under direct vision, positive ETCO2 and breath sounds checked- equal and bilateral Secured at: 23 cm Tube secured with: Tape Dental Injury: Teeth and Oropharynx as per pre-operative assessment

## 2023-03-16 NOTE — Interval H&P Note (Signed)
 History and Physical Interval Note:  03/16/2023 7:14 AM  Dennis Patel  has presented today for surgery, with the diagnosis of CRITICAL LIMB ISCHEMIA OF RIGHT LOWER EXTREMITY.  The various methods of treatment have been discussed with the patient and family. After consideration of risks, benefits and other options for treatment, the patient has consented to  Procedure(s): ENDARTERECTOMY RIGHT COMMON FEMORAL (Right) INSERTION OF ILIAC STENT (Right) as a surgical intervention.  The patient's history has been reviewed, patient examined, no change in status, stable for surgery.  I have reviewed the patient's chart and labs.  Questions were answered to the patient's satisfaction.    Risks and benefits of R CFA endarterectomy with retrograde iliac stenting, possible femoral-femoral bypass.   [name]

## 2023-03-17 ENCOUNTER — Encounter (HOSPITAL_COMMUNITY): Payer: Self-pay | Admitting: Vascular Surgery

## 2023-03-17 LAB — CBC
HCT: 34.3 % — ABNORMAL LOW (ref 39.0–52.0)
Hemoglobin: 12.3 g/dL — ABNORMAL LOW (ref 13.0–17.0)
MCH: 32.8 pg (ref 26.0–34.0)
MCHC: 35.9 g/dL (ref 30.0–36.0)
MCV: 91.5 fL (ref 80.0–100.0)
Platelets: 160 10*3/uL (ref 150–400)
RBC: 3.75 MIL/uL — ABNORMAL LOW (ref 4.22–5.81)
RDW: 12 % (ref 11.5–15.5)
WBC: 11.7 10*3/uL — ABNORMAL HIGH (ref 4.0–10.5)
nRBC: 0 % (ref 0.0–0.2)

## 2023-03-17 LAB — BASIC METABOLIC PANEL
Anion gap: 8 (ref 5–15)
BUN: 14 mg/dL (ref 8–23)
CO2: 25 mmol/L (ref 22–32)
Calcium: 8.5 mg/dL — ABNORMAL LOW (ref 8.9–10.3)
Chloride: 106 mmol/L (ref 98–111)
Creatinine, Ser: 1.06 mg/dL (ref 0.61–1.24)
GFR, Estimated: >60 mL/min (ref >60)
Glucose, Bld: 118 mg/dL — ABNORMAL HIGH (ref 70–99)
Potassium: 3.3 mmol/L — ABNORMAL LOW (ref 3.5–5.1)
Sodium: 139 mmol/L (ref 135–145)

## 2023-03-17 LAB — LIPID PANEL
Cholesterol: 89 mg/dL (ref 0–200)
HDL: 32 mg/dL — ABNORMAL LOW (ref >40)
LDL Cholesterol: 33 mg/dL (ref 0–99)
Total CHOL/HDL Ratio: 2.8 {ratio}
Triglycerides: 120 mg/dL (ref <150)
VLDL: 24 mg/dL (ref 0–40)

## 2023-03-17 MED ORDER — OXYCODONE-ACETAMINOPHEN 5-325 MG PO TABS: 1.0000 | ORAL_TABLET | Freq: Four times a day (QID) | ORAL | 0 refills | Status: DC | PRN

## 2023-03-17 NOTE — Progress Notes (Signed)
  Progress Note    03/17/2023 7:49 AM 1 Day Post-Op  Subjective:  no complaints   Vitals:   03/16/23 2313 03/17/23 0324  BP: (!) 105/54 (!) 118/54  Pulse: (!) 56 (!) 58  Resp: 20 20  Temp: 98.9 F (37.2 C) 98.6 F (37 C)  SpO2: 91% 94%   Physical Exam: Lungs:  non labored Incisions:  R groin without hematoma; incisional vac with good seal; L groin without hematoma Extremities:  brisk R DP and PT by doppler; brisk L PT by doppler Neurologic: A&O  CBC    Component Value Date/Time   WBC 11.7 (H) 03/17/2023 0323   RBC 3.75 (L) 03/17/2023 0323   HGB 12.3 (L) 03/17/2023 0323   HGB 15.2 03/15/2023 0847   HCT 34.3 (L) 03/17/2023 0323   PLT 160 03/17/2023 0323   PLT 186 03/15/2023 0847   MCV 91.5 03/17/2023 0323   MCH 32.8 03/17/2023 0323   MCHC 35.9 03/17/2023 0323   RDW 12.0 03/17/2023 0323   LYMPHSABS 1.3 03/15/2023 0847   MONOABS 0.7 03/15/2023 0847   EOSABS 0.1 03/15/2023 0847   BASOSABS 0.0 03/15/2023 0847    BMET    Component Value Date/Time   NA 139 03/17/2023 0323   K 3.3 (L) 03/17/2023 0323   CL 106 03/17/2023 0323   CO2 25 03/17/2023 0323   GLUCOSE 118 (H) 03/17/2023 0323   BUN 14 03/17/2023 0323   CREATININE 1.06 03/17/2023 0323   CREATININE 0.98 03/15/2023 0847   CALCIUM 8.5 (L) 03/17/2023 0323   GFRNONAA >60 03/17/2023 0323   GFRNONAA >60 03/15/2023 0847   GFRAA >60 10/01/2019 0943    INR    Component Value Date/Time   INR 1.0 03/14/2023 0930     Intake/Output Summary (Last 24 hours) at 03/17/2023 0749 Last data filed at 03/17/2023 0515 Gross per 24 hour  Intake 3981.34 ml  Output 4250 ml  Net -268.66 ml     Assessment/Plan:  73 y.o. male is s/p R CFA endarterectomy with bovine patch angioplasty and stenting of R CIA and EIA 1 Day Post-Op   BLE well perfused by doppler exam L groin without hematoma; R groin incision with incisional vac; educated patient to remove in 5-7 days Ambulate this morning Ok for discharge home if pain  controlled and ambulating well; office will arrange wound check in 2-3 weeks   Emilie Rutter, PA-C [facility] [phone] 03/17/2023 7:49 AM

## 2023-03-17 NOTE — Progress Notes (Signed)
 Patient given discharge instructions medication list and prescriptions sent to personal pharmacy. IV was removed. Telemetry had already been removed. Will discharge home as ordered. Patient does have incisional wound vac in place at discharge. Dominick Morella, Randall An rN

## 2023-03-17 NOTE — Discharge Instructions (Signed)
 [facility]  Discharge instructions  Lower Extremity Bypass Surgery  Please refer to the following instruction for your post-procedure care. Your surgeon or physician assistant will discuss any changes with you.  Activity  You are encouraged to walk as much as you can. You can slowly return to normal activities during the month after your surgery. Avoid strenuous activity and heavy lifting until your doctor tells you it's OK. Avoid activities such as vacuuming or swinging a golf club. Do not drive until your doctor give the OK and you are no longer taking prescription pain medications. It is also normal to have difficulty with sleep habits, eating and bowel movement after surgery. These will go away with time.  Bathing/Showering  You may shower after you go home. Do not soak in a bathtub, hot tub, or swim until the incision heals completely.  Incision Care  Clean your incision with mild soap and water. Shower every day. Pat the area dry with a clean towel. You do not need a bandage unless otherwise instructed. Do not apply any ointments or creams to your incision. If you have open wounds you will be instructed how to care for them or a visiting nurse may be arranged for you. If you have staples or sutures along your incision they will be removed at your post-op appointment. You may have skin glue on your incision. Do not peel it off. It will come off on its own in about one week. If you have a great deal of moisture in your groin, use a gauze help keep this area dry.  Diet  Resume your normal diet. There are no special food restrictions following this procedure. A low fat/ low cholesterol diet is recommended for all patients with vascular disease. In order to heal from your surgery, it is CRITICAL to get adequate nutrition. Your body requires vitamins, minerals, and protein. Vegetables are the best source of vitamins and minerals. Vegetables also provide the  perfect balance of protein. Processed food has little nutritional value, so try to avoid this.  Medications  Resume taking all your medications unless your doctor or nurse practitioner tells you not to. If your incision is causing pain, you may take over-the-counter pain relievers such as acetaminophen (Tylenol). If you were prescribed a stronger pain medication, please aware these medication can cause nausea and constipation. Prevent nausea by taking the medication with a snack or meal. Avoid constipation by drinking plenty of fluids and eating foods with high amount of fiber, such as fruits, vegetables, and grains. Take Colase 100 mg (an over-the-counter stool softener) twice a day as needed for constipation. Do not take Tylenol if you are taking prescription pain medications.  Follow Up  Our office will schedule a follow up appointment 2-3 weeks following discharge.  Please call us immediately for any of the following conditions  Severe or worsening pain in your legs or feet while at rest or while walking Increase pain, redness, warmth, or drainage (pus) from your incision site(s) Fever of 101 degree or higher The swelling in your leg with the bypass suddenly worsens and becomes more painful than when you were in the hospital If you have been instructed to feel your graft pulse then you should do so every day. If you can no longer feel this pulse, call the office immediately. Not all patients are given this instruction.  Leg swelling is common after leg bypass surgery.  The swelling should improve over a few months  following surgery. To improve the swelling, you may elevate your legs above the level of your heart while you are sitting or resting. Your surgeon or physician assistant may ask you to apply an ACE wrap or wear compression (TED) stockings to help to reduce swelling.  Reduce your risk of vascular disease  Stop smoking. If you would like help call [facility] at 1-800-QUIT-NOW  ((470) 654-9347) or [place] at (807)887-0074.  Manage your cholesterol Maintain a desired weight Control your diabetes weight Control your diabetes Keep your blood pressure down  If you have any questions, please call the office at [phone]

## 2023-03-20 NOTE — Discharge Summary (Signed)
 Discharge Summary  Patient ID: Dennis Patel [id number] 73 y.o. 19-Sep-1950  Admit date: 03/16/2023  Discharge date and time: 03/17/2023  9:06 AM   Admitting Physician: [name], MD   Discharge Physician: same  Admission Diagnoses: Critical limb ischemia of right lower extremity (HCC) [I70.221] Aortoiliac occlusive disease (HCC) [I74.09]  Discharge Diagnoses: same  Admission Condition: fair  Discharged Condition: fair  Indication for Admission: Postoperative care  Hospital Course: Dennis Patel is a 73 year old male with critical limb ischemia with rest pain of the right lower extremity.  He was brought in as an outpatient on 03/16/2023 and underwent right iliofemoral endarterectomy with bovine patch angioplasty and stenting of the right common and external iliac arteries by Dr. Hetty Blend.  He tolerated procedure well and was admitted to the hospital postoperatively.  Postoperative day #1 bilateral lower extremities were well-perfused.  Right groin incision had an incisional VAC which will stay on for about 5 to 7 days.  Left groin access site was without hematoma.  He had brisk signals by Doppler in bilateral lower extremities.  Patient was educated on how to remove incisional VAC from right groin.  After ambulating in the morning he was ready for discharge home.  He will follow-up in the office in 2 to 3 weeks for incision check.  He was prescribed 2 to 3 days of narcotic pain medication for continued postoperative pain control.  He will complete his Bactrim prescription.  He will also continue his statin and Plavix daily.  He was discharged home in stable condition.  Consults: None  Treatments: surgery: Right iliofemoral endarterectomy with bovine patch angioplasty and stenting of the right common and external iliac artery by Dr. Hetty Blend on 03/16/2023  Discharge Exam: See progress note 03/17/23 Vitals:   03/17/23 0324 03/17/23 0759  BP: (!) 118/54 (!) 113/48  Pulse: (!)  58 60  Resp: 20 16  Temp: 98.6 F (37 C) 98.5 F (36.9 C)  SpO2: 94% 92%     Disposition: Discharge disposition: 01-Home or Self Care       Patient Instructions:  Allergies as of 03/17/2023   No Known Allergies      Medication List     TAKE these medications    abiraterone acetate 250 MG tablet Commonly known as: Zytiga Take 4 tablets (1,000 mg total) by mouth daily. Take on an empty stomach 1 hour before or 2 hours after a meal   acetaminophen 500 MG tablet Commonly known as: TYLENOL Take 1,000 mg by mouth at bedtime.   amLODipine 10 MG tablet Commonly known as: NORVASC Take 1 tablet (10 mg total) by mouth daily.   atorvastatin 10 MG tablet Commonly known as: LIPITOR TAKE 1 TABLET BY MOUTH EVERY DAY   calcium-vitamin D 250-125 MG-UNIT tablet Commonly known as: OSCAL WITH D Take 1 tablet by mouth in the morning.   clopidogrel 75 MG tablet Commonly known as: PLAVIX Take 1 tablet (75 mg total) by mouth daily.   famotidine 10 MG tablet Commonly known as: PEPCID Take 10 mg by mouth in the morning.   gabapentin 300 MG capsule Commonly known as: NEURONTIN TAKE 1 CAPSULE BY MOUTH THREE TIMES A DAY What changed: See the new instructions.   hydrochlorothiazide 12.5 MG tablet Commonly known as: HYDRODIURIL Take 12.5 mg by mouth every morning.   naproxen sodium 220 MG tablet Commonly known as: ALEVE Take 220 mg by mouth in the morning.   ONE DAILY MULTIVITAMIN MEN PO Take 1 tablet by  mouth in the morning.   oxyCODONE-acetaminophen 5-325 MG tablet Commonly known as: PERCOCET/ROXICET Take 1 tablet by mouth every 6 (six) hours as needed for moderate pain (pain score 4-6).   predniSONE 5 MG tablet Commonly known as: DELTASONE TAKE 1 TABLET BY MOUTH EVERY DAY WITH BREAKFAST   sulfamethoxazole-trimethoprim 800-160 MG tablet Commonly known as: Bactrim DS Take 1 tablet by mouth 2 (two) times daily for 5 days.   traZODone 50 MG tablet Commonly known as:  DESYREL TAKE 1 TABLET (50 MG TOTAL) BY MOUTH AT BEDTIME AS NEEDED. FOR SLEEP What changed:  how much to take when to take this       Activity: activity as tolerated Diet: regular diet Wound Care: keep wound clean and dry  Follow-up with [facility] in 2 weeks.  Signed: Emilie Rutter, PA-C 03/20/2023 1:39 PM [facility] Office: [phone]

## 2023-03-21 ENCOUNTER — Other Ambulatory Visit (HOSPITAL_COMMUNITY): Payer: Self-pay

## 2023-03-22 ENCOUNTER — Other Ambulatory Visit: Payer: Self-pay

## 2023-03-22 NOTE — Progress Notes (Signed)
 Specialty Pharmacy Refill Coordination Note  Dennis Patel is a 73 y.o. male contacted today regarding refills of specialty medication(s) Abiraterone Acetate (ZYTIGA)   Patient requested (Patient-Rptd) Delivery   Delivery date: (Patient-Rptd) 03/31/23   Verified address: (Patient-Rptd) [address] [place] [address]   Medication will be filled on 03/30/23.

## 2023-03-23 ENCOUNTER — Telehealth: Payer: Self-pay | Admitting: *Deleted

## 2023-03-23 NOTE — Telephone Encounter (Addendum)
 Contacted patient per MD request with message below. Patient verbalized understanding   From: [name], MD Sent: 03/18/2023   4:57 PM EST  Please let Dennis Patel know that his PSA increased slightly from 0.2 to 0.4.  We will continue to monitor closely.

## 2023-04-12 DIAGNOSIS — L821 Other seborrheic keratosis: Secondary | ICD-10-CM | POA: Diagnosis not present

## 2023-04-12 DIAGNOSIS — D225 Melanocytic nevi of trunk: Secondary | ICD-10-CM | POA: Diagnosis not present

## 2023-04-12 DIAGNOSIS — D045 Carcinoma in situ of skin of trunk: Secondary | ICD-10-CM | POA: Diagnosis not present

## 2023-04-12 DIAGNOSIS — Z85828 Personal history of other malignant neoplasm of skin: Secondary | ICD-10-CM | POA: Diagnosis not present

## 2023-04-12 DIAGNOSIS — L905 Scar conditions and fibrosis of skin: Secondary | ICD-10-CM | POA: Diagnosis not present

## 2023-04-12 DIAGNOSIS — L570 Actinic keratosis: Secondary | ICD-10-CM | POA: Diagnosis not present

## 2023-04-12 DIAGNOSIS — D044 Carcinoma in situ of skin of scalp and neck: Secondary | ICD-10-CM | POA: Diagnosis not present

## 2023-04-13 ENCOUNTER — Ambulatory Visit (INDEPENDENT_AMBULATORY_CARE_PROVIDER_SITE_OTHER): Admitting: Physician Assistant

## 2023-04-13 ENCOUNTER — Encounter: Payer: Self-pay | Admitting: Physician Assistant

## 2023-04-13 VITALS — BP 179/79 | HR 67 | Temp 97.3°F | Resp 98 | Ht 68.0 in | Wt 190.2 lb

## 2023-04-13 DIAGNOSIS — I70221 Atherosclerosis of native arteries of extremities with rest pain, right leg: Secondary | ICD-10-CM

## 2023-04-13 MED ORDER — AMOXICILLIN-POT CLAVULANATE 875-125 MG PO TABS: 1.0000 | ORAL_TABLET | Freq: Two times a day (BID) | ORAL | 0 refills | Status: DC

## 2023-04-13 MED ORDER — FLUCONAZOLE 150 MG PO TABS: 150.0000 mg | ORAL_TABLET | Freq: Every day | ORAL | 0 refills | Status: DC

## 2023-04-13 NOTE — Progress Notes (Signed)
 POST OPERATIVE OFFICE NOTE    CC:  F/u for surgery  HPI:  This is a 73 y.o. male who most recently underwent right iliofemoral, profunda and SFA endarterectomy, stenting of right EIA and CIA on 03/16/2023 by Dr. Hetty Blend for CLTI with rest pain.   Vascular surgery hx: -aortogram with angioplasty and stenting of left CIA 03/17/2017 Dr. Edilia Bo -aortogram with angioplasty and stenting left CIA 09/08/2017 Dr. Edilia Bo -aortogram 02/13/2023 Dr. Hetty Blend -right iliofemoral, profunda and SFA endarterectomy, stenting of right EIA and CIA on 03/16/2023 by Dr. Hetty Blend   Pt was discharged home on POD 1.  He had brisk right DP and PT by doppler and left PT by doppler.  He had an incisional vac that had good seal.    Pt returns today for follow up.  Pt states he was doing well until last Tuesday when he started feeling a pulling in his right groin.  He states that he started having some clear pink tinged drainage.  He has been using peroxide and vaseline on his incision.  He states that this has progressively gotten worse and it makes it hard to get in and out of the car.  He states he had some chills last evening.  He states he has had a little more drainage that is a little more red.    He states that the pain in his foot and leg with walking is dramatically better.    No Known Allergies  Current Outpatient Medications  Medication Sig Dispense Refill   abiraterone acetate (ZYTIGA) 250 MG tablet Take 4 tablets (1,000 mg total) by mouth daily. Take on an empty stomach 1 hour before or 2 hours after a meal 120 tablet 9   acetaminophen (TYLENOL) 500 MG tablet Take 1,000 mg by mouth at bedtime.     amLODipine (NORVASC) 10 MG tablet Take 1 tablet (10 mg total) by mouth daily. 30 tablet 0   atorvastatin (LIPITOR) 10 MG tablet TAKE 1 TABLET BY MOUTH EVERY DAY 90 tablet 3   calcium-vitamin D (OSCAL WITH D) 250-125 MG-UNIT tablet Take 1 tablet by mouth in the morning.     clopidogrel (PLAVIX) 75 MG tablet Take 1  tablet (75 mg total) by mouth daily. 90 tablet 1   famotidine (PEPCID) 10 MG tablet Take 10 mg by mouth in the morning.     gabapentin (NEURONTIN) 300 MG capsule TAKE 1 CAPSULE BY MOUTH THREE TIMES A DAY (Patient taking differently: Take 300 mg by mouth 2 (two) times daily.) 90 capsule 3   hydrochlorothiazide (HYDRODIURIL) 12.5 MG tablet Take 12.5 mg by mouth every morning.     Multiple Vitamins-Minerals (ONE DAILY MULTIVITAMIN MEN PO) Take 1 tablet by mouth in the morning.     naproxen sodium (ALEVE) 220 MG tablet Take 220 mg by mouth in the morning.     oxyCODONE-acetaminophen (PERCOCET/ROXICET) 5-325 MG tablet Take 1 tablet by mouth every 6 (six) hours as needed for moderate pain (pain score 4-6). 20 tablet 0   predniSONE (DELTASONE) 5 MG tablet TAKE 1 TABLET BY MOUTH EVERY DAY WITH BREAKFAST 90 tablet 3   traZODone (DESYREL) 50 MG tablet TAKE 1 TABLET (50 MG TOTAL) BY MOUTH AT BEDTIME AS NEEDED. FOR SLEEP (Patient taking differently: Take 100-150 mg by mouth at bedtime. for sleep) 90 tablet 1   No current facility-administered medications for this visit.     ROS:  See HPI  Physical Exam:  Today's Vitals   04/13/23 1045  BP: (!) 179/79  Pulse: 67  Resp: (!) 98  Temp: (!) 97.3 F (36.3 C)  TempSrc: Temporal  Weight: 190 lb 3.2 oz (86.3 kg)  Height: 5\' 8"  (1.727 m)  PainSc: 4    Body mass index is 28.92 kg/m.   Incision:    Dr. Karin Lieu able to express some mild devitalized adipose tissue from distal portion.  No bleeding or bloody drainage noted.   Yeast odor.   Extremities:  brisk right DP/PT/peroneal.     Assessment/Plan:  This is a 73 y.o. male who is s/p: right iliofemoral, profunda and SFA endarterectomy, stenting of right EIA and CIA on 03/16/2023 by Dr. Hetty Blend for CLTI with rest pain.   -pt with brisk doppler flow right foot -right groin incision with erythema.  Pt seen with Dr. Karin Lieu.  Unable to express any bloody drainage.  There is no fluctuance.  He felt he  has some cellulitis and did not feel the pt had any deep infection.  Augmentin 875-125 bid x 14 days and Diflucan 150 mg daily x 3 days was sent to his pharmacy.  Discussed with pt that if this is worsening to contact us.  Discussed if he develops ongoing bleeding to call 911.  He will f/u next Friday when Dr. Hetty Blend in in clinic.      Doreatha Massed, [facility] [phone]   Clinic MD:  Karin Lieu

## 2023-04-17 ENCOUNTER — Other Ambulatory Visit: Payer: Self-pay

## 2023-04-17 DIAGNOSIS — I70219 Atherosclerosis of native arteries of extremities with intermittent claudication, unspecified extremity: Secondary | ICD-10-CM

## 2023-04-18 ENCOUNTER — Other Ambulatory Visit (HOSPITAL_COMMUNITY): Payer: Self-pay

## 2023-04-20 ENCOUNTER — Other Ambulatory Visit: Payer: Self-pay

## 2023-04-20 NOTE — Progress Notes (Signed)
 Specialty Pharmacy Refill Coordination Note  Dennis Patel is a 73 y.o. male contacted today regarding refills of specialty medication(s) Abiraterone Acetate [name])   Patient requested Delivery   Delivery date: 04/26/23   Verified address: [address]   [place] [id number]   Medication will be filled on 04/25/23.

## 2023-04-21 ENCOUNTER — Ambulatory Visit (INDEPENDENT_AMBULATORY_CARE_PROVIDER_SITE_OTHER): Admitting: Physician Assistant

## 2023-04-21 VITALS — BP 147/67 | HR 60 | Temp 98.0°F | Ht 68.0 in | Wt 191.0 lb

## 2023-04-21 DIAGNOSIS — I70219 Atherosclerosis of native arteries of extremities with intermittent claudication, unspecified extremity: Secondary | ICD-10-CM

## 2023-04-21 DIAGNOSIS — R239 Unspecified skin changes: Secondary | ICD-10-CM

## 2023-04-21 DIAGNOSIS — T8189XA Other complications of procedures, not elsewhere classified, initial encounter: Secondary | ICD-10-CM

## 2023-04-21 NOTE — Progress Notes (Signed)
 Office Note     CC:  follow up Requesting Provider:  No ref. provider found  HPI: Dennis Patel is a 73 y.o. ([birth year]) male who presents for incision check. He is s/p right iliofemoral, profunda and SFA endarterectomy, stenting of right EIA and CIA on 03/16/2023 by Dr. Hetty Blend for CLTI with rest pain. He did well post operatively and his rest pain has resolved.   He was seen on 3/27 and had some clear pink tinged drainage. This progressed with some redness and chills. With some concern of cellulitis he was started on Augmentin x 10 days as well as Diflucan for some concern of yeast infection. Otherwise his foot pain and walking discomfort improved dramatically.  Today he says his incision is much better. He is changing the dressing 3x/ day. He does report drainage - yellow/ bloody drainage on dressing every time. He denies any fever or chills. He is taking his Augmentin and Diflucan as directed. Continues to have resolution of any foot pain or pain on ambulation.   Vascular surgery hx: -aortogram with angioplasty and stenting of left CIA 03/17/2017 Dr. Edilia Bo -aortogram with angioplasty and stenting left CIA 09/08/2017 Dr. Edilia Bo -aortogram 02/13/2023 Dr. Hetty Blend -right iliofemoral, profunda and SFA endarterectomy, stenting of right EIA and CIA on 03/16/2023 by Dr. Hetty Blend   Past Medical History:  Diagnosis Date   Arthritis    right pinky finger   Atherosclerosis    BPH (benign prostatic hyperplasia)    Cancer (HCC)    Claudication (HCC)    Elevated PSA    Family history of adverse reaction to anesthesia    ' My Brother had an allergic reaction "   History of kidney stones    Hypertension    Peripheral arterial disease (HCC)    Peripheral artery disease (HCC)    Peripheral vascular disease (HCC)    Pre-diabetes    prostate ca with bone mets dx'd 11/2017   Pure hypercholesterolemia    Skin cancer    Multiple    Past Surgical History:  Procedure Laterality Date    ABDOMINAL ANGIOGRAM N/A 03/17/2011   Procedure: ABDOMINAL ANGIOGRAM;  Surgeon: Corky Crafts, MD;  Location: [facility] CATH LAB;  Service: Cardiovascular;  Laterality: N/A;   ABDOMINAL AORTOGRAM W/LOWER EXTREMITY N/A 03/17/2017   Procedure: ABDOMINAL AORTOGRAM W/LOWER EXTREMITY;  Surgeon: Chuck Hint, MD;  Location: [facility] INVASIVE CV LAB;  Service: Cardiovascular;  Laterality: N/A;   ABDOMINAL AORTOGRAM W/LOWER EXTREMITY N/A 09/08/2017   Procedure: ABDOMINAL AORTOGRAM W/LOWER EXTREMITY;  Surgeon: Chuck Hint, MD;  Location: [facility] INVASIVE CV LAB;  Service: Cardiovascular;  Laterality: N/A;   ABDOMINAL AORTOGRAM W/LOWER EXTREMITY N/A 02/13/2023   Procedure: ABDOMINAL AORTOGRAM W/LOWER EXTREMITY;  Surgeon: [name], MD;  Location: [facility] INVASIVE CV LAB;  Service: Cardiovascular;  Laterality: N/A;   APPLICATION OF WOUND VAC Right 03/16/2023   Procedure: APPLICATION OF WOUND VAC - PREVENA;  Surgeon: [name], MD;  Location: [facility] OR;  Service: Vascular;  Laterality: Right;   COLONOSCOPY  2022   ENDARTERECTOMY FEMORAL Right 03/16/2023   Procedure: ENDARTERECTOMY RIGHT COMMON FEMORAL;  Surgeon: [name], MD;  Location: [facility] OR;  Service: Vascular;  Laterality: Right;   ILIAC ARTERY STENT     INSERTION OF ILIAC STENT Right 03/16/2023   Procedure: INSERTION OF RIGHT COMMON ILIAC ARTERY AND RIGHT COMMON FEMORAL ARTERY STENT;  Surgeon: [name], MD;  Location: [facility] OR;  Service: Vascular;  Laterality: Right;   IR FLUORO  GUIDE CV LINE RIGHT  12/07/2021   IR IMAGING GUIDED PORT INSERTION  01/30/2018   IR REMOVAL TUN ACCESS W/ PORT W/O FL MOD SED  12/07/2021   LOWER EXTREMITY ANGIOGRAM N/A 03/17/2011   Procedure: LOWER EXTREMITY ANGIOGRAM;  Surgeon: Corky Crafts, MD;  Location: [facility] CATH LAB;  Service: Cardiovascular;  Laterality: N/A;   PATCH ANGIOPLASTY Right 03/16/2023   Procedure: PATCH ANGIOPLASTY RIGHT FEMORAL ARTERY USING XENOSURE BIOLOGIC PATCH;  Surgeon:  [name], MD;  Location: [facility] OR;  Service: Vascular;  Laterality: Right;   PERIPHERAL VASCULAR INTERVENTION Left 09/08/2017   Procedure: PERIPHERAL VASCULAR INTERVENTION;  Surgeon: Chuck Hint, MD;  Location: [facility] INVASIVE CV LAB;  Service: Cardiovascular;  Laterality: Left;  Common iliac    Social History   Socioeconomic History   Marital status: Married    Spouse name: Not on file   Number of children: Not on file   Years of education: Not on file   Highest education level: Not on file  Occupational History   Not on file  Tobacco Use   Smoking status: Former    Current packs/day: 0.00    Types: Cigarettes    Quit date: 2005    Years since quitting: 20.2   Smokeless tobacco: Never   Tobacco comments:    4-5 cigars per day. Last cigar 2/23 and is quitting cigars  Vaping Use   Vaping status: Never Used  Substance and Sexual Activity   Alcohol use: Never    Comment: 12/09/1993 quit drinking   Drug use: Never   Sexual activity: Yes  Other Topics Concern   Not on file  Social History Narrative   ** Merged History Encounter **       ** Merged History Encounter **       Social Drivers of Corporate investment banker Strain: Not on file  Food Insecurity: No Food Insecurity (03/16/2023)   Hunger Vital Sign    Worried About Running Out of Food in the Last Year: Never true    Ran Out of Food in the Last Year: Never true  Transportation Needs: No Transportation Needs (03/16/2023)   PRAPARE - Administrator, Civil Service (Medical): No    Lack of Transportation (Non-Medical): No  Physical Activity: Not on file  Stress: Not on file  Social Connections: Moderately Integrated (03/16/2023)   Social Connection and Isolation Panel [NHANES]    Frequency of Communication with Friends and Family: More than three times a week    Frequency of Social Gatherings with Friends and Family: More than three times a week    Attends Religious Services: More than 4 times  per year    Active Member of [employer] or Organizations: Yes    Attends Banker Meetings: More than 4 times per year    Marital Status: Widowed  Intimate Partner Violence: Not At Risk (03/16/2023)   Humiliation, Afraid, Rape, and Kick questionnaire    Fear of Current or Ex-Partner: No    Emotionally Abused: No    Physically Abused: No    Sexually Abused: No    Family History  Problem Relation Age of Onset   Ovarian cancer Mother 34   Diabetes Mother    CAD Father    Diabetes Sister    Lymphoma Brother 30   Skin cancer Brother        basal and squamous cell   Ovarian cancer Maternal Grandmother    CAD Son  Diabetes Son    Heart disease Son    Ovarian cancer Maternal Great-grandmother     Current Outpatient Medications  Medication Sig Dispense Refill   abiraterone acetate (ZYTIGA) 250 MG tablet Take 4 tablets (1,000 mg total) by mouth daily. Take on an empty stomach 1 hour before or 2 hours after a meal 120 tablet 9   acetaminophen (TYLENOL) 500 MG tablet Take 1,000 mg by mouth at bedtime.     amLODipine (NORVASC) 10 MG tablet Take 1 tablet (10 mg total) by mouth daily. 30 tablet 0   amoxicillin-clavulanate (AUGMENTIN) 875-125 MG tablet Take 1 tablet by mouth 2 (two) times daily. 28 tablet 0   atorvastatin (LIPITOR) 10 MG tablet TAKE 1 TABLET BY MOUTH EVERY DAY 90 tablet 3   calcium-vitamin D (OSCAL WITH D) 250-125 MG-UNIT tablet Take 1 tablet by mouth in the morning.     clopidogrel (PLAVIX) 75 MG tablet Take 1 tablet (75 mg total) by mouth daily. 90 tablet 1   famotidine (PEPCID) 10 MG tablet Take 10 mg by mouth in the morning.     fluconazole (DIFLUCAN) 150 MG tablet Take 1 tablet (150 mg total) by mouth daily. 3 tablet 0   gabapentin (NEURONTIN) 300 MG capsule TAKE 1 CAPSULE BY MOUTH THREE TIMES A DAY (Patient taking differently: Take 300 mg by mouth 2 (two) times daily.) 90 capsule 3   hydrochlorothiazide (HYDRODIURIL) 12.5 MG tablet Take 12.5 mg by mouth every  morning.     Multiple Vitamins-Minerals (ONE DAILY MULTIVITAMIN MEN PO) Take 1 tablet by mouth in the morning.     naproxen sodium (ALEVE) 220 MG tablet Take 220 mg by mouth in the morning.     predniSONE (DELTASONE) 5 MG tablet TAKE 1 TABLET BY MOUTH EVERY DAY WITH BREAKFAST 90 tablet 3   traZODone (DESYREL) 50 MG tablet TAKE 1 TABLET (50 MG TOTAL) BY MOUTH AT BEDTIME AS NEEDED. FOR SLEEP (Patient taking differently: Take 100-150 mg by mouth at bedtime. for sleep) 90 tablet 1   No current facility-administered medications for this visit.    No Known Allergies   REVIEW OF SYSTEMS:  [X]  denotes positive finding, [ ]  denotes negative finding Cardiac  Comments:  Chest pain or chest pressure:    Shortness of breath upon exertion:    Short of breath when lying flat:    Irregular heart rhythm:        Vascular    Pain in calf, thigh, or hip brought on by ambulation:    Pain in feet at night that wakes you up from your sleep:     Blood clot in your veins:    Leg swelling:         Pulmonary    Oxygen at home:    Productive cough:     Wheezing:         Neurologic    Sudden weakness in arms or legs:     Sudden numbness in arms or legs:     Sudden onset of difficulty speaking or slurred speech:    Temporary loss of vision in one eye:     Problems with dizziness:         Gastrointestinal    Blood in stool:     Vomited blood:         Genitourinary    Burning when urinating:     Blood in urine:        Psychiatric    Major depression:  Hematologic    Bleeding problems:    Problems with blood clotting too easily:        Skin    Rashes or ulcers:        Constitutional    Fever or chills:      PHYSICAL EXAMINATION:  Vitals:   04/21/23 0838  BP: (!) 147/67  Pulse: 60  Temp: 98 F (36.7 C)  TempSrc: Temporal  SpO2: 95%  Weight: 191 lb (86.6 kg)  Height: 5\' 8"  (1.727 m)    General:  WDWN in NAD; vital signs documented above Gait: Normal HENT: WNL,  normocephalic Pulmonary: normal non-labored breathing , without wheezing Cardiac: regular HR Abdomen: soft Vascular Exam/Pulses: 2+ right femoral Extremities: without ischemic changes, without Gangrene , without cellulitis; without open wounds; right groin incision as show below. No erythema.  No drainage on compression. Some surrounding skin irritation from tape  Musculoskeletal: no muscle wasting or atrophy  Neurologic: A&O X 3 Psychiatric:  The pt has Normal affect.   ASSESSMENT/PLAN:: 73 y.o. male here for follow up for incision check. He is s/p right iliofemoral, profunda and SFA endarterectomy, stenting of right EIA and CIA on 03/16/2023 by Dr. Hetty Blend for CLTI with rest pain. Right groin incision is improving. Only several small areas of incisional dehiscence. No signs of infection. Continue 3x daily dressing changes as needed - Patient knows to call for earlier appointment if he has increased drainage, swelling, redness, warmth, fever/ chills - Complete course of Augmentin x 14 days - He will keep his appointment on 04/28/23 to follow up with Dr. Hetty Blend with non invasive studies   Graceann Congress, PA-C [facility] [phone]  Clinic MD:   Hetty Blend

## 2023-04-25 NOTE — Progress Notes (Unsigned)
 Patient ID: Dennis Patel, male   DOB: 01-29-1950, 74 y.o.   MRN: [id number]  Reason for Consult: Routine Post Op   Referred by No ref. provider found  Subjective:     HPI  Dennis Patel is a 73 y.o. male presenting for follow up. On 03/16/2023 he underwent right iliofemoral, profunda and SFA endarterectomy, stenting of right EIA and CIA for rest pain. He did has a bout of cellulitis about 2 weeks ago which was treated with abx and has since been doing well. He reports his rest pain has resolved. Reports incision is doing well and incisional pain is just about resolved.   Past Medical History:  Diagnosis Date   Arthritis    right pinky finger   Atherosclerosis    BPH (benign prostatic hyperplasia)    Cancer (HCC)    Claudication (HCC)    Elevated PSA    Family history of adverse reaction to anesthesia    ' My Brother had an allergic reaction "   History of kidney stones    Hypertension    Peripheral arterial disease (HCC)    Peripheral artery disease (HCC)    Peripheral vascular disease (HCC)    Pre-diabetes    prostate ca with bone mets dx'd 11/2017   Pure hypercholesterolemia    Skin cancer    Multiple   Family History  Problem Relation Age of Onset   Ovarian cancer Mother 63   Diabetes Mother    CAD Father    Diabetes Sister    Lymphoma Brother 40   Skin cancer Brother        basal and squamous cell   Ovarian cancer Maternal Grandmother    CAD Son    Diabetes Son    Heart disease Son    Ovarian cancer Maternal Great-grandmother    Past Surgical History:  Procedure Laterality Date   ABDOMINAL ANGIOGRAM N/A 03/17/2011   Procedure: ABDOMINAL ANGIOGRAM;  Surgeon: Corky Crafts, MD;  Location: [facility] CATH LAB;  Service: Cardiovascular;  Laterality: N/A;   ABDOMINAL AORTOGRAM W/LOWER EXTREMITY N/A 03/17/2017   Procedure: ABDOMINAL AORTOGRAM W/LOWER EXTREMITY;  Surgeon: Chuck Hint, MD;  Location: [facility] INVASIVE CV LAB;  Service: Cardiovascular;   Laterality: N/A;   ABDOMINAL AORTOGRAM W/LOWER EXTREMITY N/A 09/08/2017   Procedure: ABDOMINAL AORTOGRAM W/LOWER EXTREMITY;  Surgeon: Chuck Hint, MD;  Location: [facility] INVASIVE CV LAB;  Service: Cardiovascular;  Laterality: N/A;   ABDOMINAL AORTOGRAM W/LOWER EXTREMITY N/A 02/13/2023   Procedure: ABDOMINAL AORTOGRAM W/LOWER EXTREMITY;  Surgeon: [name], MD;  Location: [facility] INVASIVE CV LAB;  Service: Cardiovascular;  Laterality: N/A;   APPLICATION OF WOUND VAC Right 03/16/2023   Procedure: APPLICATION OF WOUND VAC - PREVENA;  Surgeon: [name], MD;  Location: [facility] OR;  Service: Vascular;  Laterality: Right;   COLONOSCOPY  2022   ENDARTERECTOMY FEMORAL Right 03/16/2023   Procedure: ENDARTERECTOMY RIGHT COMMON FEMORAL;  Surgeon: [name], MD;  Location: [facility] OR;  Service: Vascular;  Laterality: Right;   ILIAC ARTERY STENT     INSERTION OF ILIAC STENT Right 03/16/2023   Procedure: INSERTION OF RIGHT COMMON ILIAC ARTERY AND RIGHT COMMON FEMORAL ARTERY STENT;  Surgeon: [name], MD;  Location: [facility] OR;  Service: Vascular;  Laterality: Right;   IR FLUORO GUIDE CV LINE RIGHT  12/07/2021   IR IMAGING GUIDED PORT INSERTION  01/30/2018   IR REMOVAL TUN ACCESS W/ PORT W/O FL MOD SED  12/07/2021  LOWER EXTREMITY ANGIOGRAM N/A 03/17/2011   Procedure: LOWER EXTREMITY ANGIOGRAM;  Surgeon: Corky Crafts, MD;  Location: [facility] CATH LAB;  Service: Cardiovascular;  Laterality: N/A;   PATCH ANGIOPLASTY Right 03/16/2023   Procedure: PATCH ANGIOPLASTY RIGHT FEMORAL ARTERY USING XENOSURE BIOLOGIC PATCH;  Surgeon: [name], MD;  Location: [facility] OR;  Service: Vascular;  Laterality: Right;   PERIPHERAL VASCULAR INTERVENTION Left 09/08/2017   Procedure: PERIPHERAL VASCULAR INTERVENTION;  Surgeon: Chuck Hint, MD;  Location: [facility] INVASIVE CV LAB;  Service: Cardiovascular;  Laterality: Left;  Common iliac    Short Social History:  Social History   Tobacco Use   Smoking  status: Former    Current packs/day: 0.00    Types: Cigarettes    Quit date: 2005    Years since quitting: 20.2   Smokeless tobacco: Never   Tobacco comments:    4-5 cigars per day. Last cigar 2/23 and is quitting cigars  Substance Use Topics   Alcohol use: Never    Comment: 12/09/1993 quit drinking    No Known Allergies  Current Outpatient Medications  Medication Sig Dispense Refill   abiraterone acetate (ZYTIGA) 250 MG tablet Take 4 tablets (1,000 mg total) by mouth daily. Take on an empty stomach 1 hour before or 2 hours after a meal 120 tablet 9   acetaminophen (TYLENOL) 500 MG tablet Take 1,000 mg by mouth at bedtime.     amLODipine (NORVASC) 10 MG tablet Take 1 tablet (10 mg total) by mouth daily. 30 tablet 0   atorvastatin (LIPITOR) 10 MG tablet TAKE 1 TABLET BY MOUTH EVERY DAY 90 tablet 3   calcium-vitamin D (OSCAL WITH D) 250-125 MG-UNIT tablet Take 1 tablet by mouth in the morning.     clopidogrel (PLAVIX) 75 MG tablet Take 1 tablet (75 mg total) by mouth daily. 90 tablet 1   famotidine (PEPCID) 10 MG tablet Take 10 mg by mouth in the morning.     fluconazole (DIFLUCAN) 150 MG tablet Take 1 tablet (150 mg total) by mouth daily. 3 tablet 0   gabapentin (NEURONTIN) 300 MG capsule TAKE 1 CAPSULE BY MOUTH THREE TIMES A DAY (Patient taking differently: Take 300 mg by mouth 2 (two) times daily.) 90 capsule 3   hydrochlorothiazide (HYDRODIURIL) 12.5 MG tablet Take 12.5 mg by mouth every morning.     Multiple Vitamins-Minerals (ONE DAILY MULTIVITAMIN MEN PO) Take 1 tablet by mouth in the morning.     naproxen sodium (ALEVE) 220 MG tablet Take 220 mg by mouth in the morning.     predniSONE (DELTASONE) 5 MG tablet TAKE 1 TABLET BY MOUTH EVERY DAY WITH BREAKFAST 90 tablet 3   traZODone (DESYREL) 50 MG tablet TAKE 1 TABLET (50 MG TOTAL) BY MOUTH AT BEDTIME AS NEEDED. FOR SLEEP (Patient taking differently: Take 100-150 mg by mouth at bedtime. for sleep) 90 tablet 1    amoxicillin-clavulanate (AUGMENTIN) 875-125 MG tablet Take 1 tablet by mouth 2 (two) times daily. (Patient not taking: Reported on 04/28/2023) 28 tablet 0   No current facility-administered medications for this visit.    REVIEW OF SYSTEMS   All other systems were reviewed and are negative     Objective:  Objective   Vitals:   04/28/23 0911  BP: (!) 152/75  Pulse: 61  Resp: 18  Temp: 98.2 F (36.8 C)  TempSrc: Temporal  SpO2: 95%  Weight: 192 lb (87.1 kg)  Height: 5\' 8"  (1.727 m)   Body mass index is 29.19 kg/m.  Physical Exam General: no acute distress Cardiac: hemodynamically stable Pulm: normal work of breathing Neuro: alert, no focal deficit Extremities: Right groin incision healing well, some scant clear drainage from some slight skin dehiscence at the inferior portion of the incision.  Data: ABI +---------+------------------+-----+----------+--------+  Right   Rt Pressure (mmHg)IndexWaveform  Comment   +---------+------------------+-----+----------+--------+  Brachial 149                                        +---------+------------------+-----+----------+--------+  PTA     73                0.48 monophasic          +---------+------------------+-----+----------+--------+  DP      75                0.50 monophasic          +---------+------------------+-----+----------+--------+  Great Toe32                0.21 Abnormal            +---------+------------------+-----+----------+--------+   +---------+------------------+-----+----------+-------+  Left    Lt Pressure (mmHg)IndexWaveform  Comment  +---------+------------------+-----+----------+-------+  Brachial 151                                       +---------+------------------+-----+----------+-------+  PTA     92                0.61 monophasic         +---------+------------------+-----+----------+-------+  DP      80                0.53 monophasic          +---------+------------------+-----+----------+-------+  Great Toe68                0.45 Abnormal           +---------+------------------+-----+----------+-------+     Aortoiliac duplex +-------------+-------+----------+---------+----------+--------+-----------  ----+  Location    AP (cm)Trans (cm)PSV      Waveform  ThrombusComments                                        (cm/s)                                       +-------------+-------+----------+---------+----------+--------+-----------  ----+  RT EIA Distal                 124      monophasic        brisk             +-------------+-------+----------+---------+----------+--------+-----------  ----+  LT EIA Prox                   186      monophasic                          +-------------+-------+----------+---------+----------+--------+-----------  ----+  LT EIA Mid                    230      monophasic                          +-------------+-------+----------+---------+----------+--------+-----------  ----+  LT EIA Distal                 83       monophasic        calcific  plaque  +-------------+-------+----------+---------+----------+--------+-----------  ----+   No color flow visualized in the left CFA.   Right Stent(s):  +---------------+--------+--------+--------+--------+  CIA           PSV cm/sStenosisWaveformComments  +---------------+--------+--------+--------+--------+  Prox to Stent  149             biphasic          +---------------+--------+--------+--------+--------+  Proximal Stent 187             biphasic          +---------------+--------+--------+--------+--------+  Mid Stent      172             biphasic          +---------------+--------+--------+--------+--------+  Distal Stent   147             biphasic          +---------------+--------+--------+--------+--------+  Distal to [id number]              biphasic          +---------------+--------+--------+--------+--------+     +---------------+--------+--------+----------+--------+  EIA           PSV cm/sStenosisWaveform  Comments  +---------------+--------+--------+----------+--------+  Prox to Stent  117             monophasicbrisk     +---------------+--------+--------+----------+--------+  Proximal Stent 92              monophasicbrisk     +---------------+--------+--------+----------+--------+  Mid Stent      94              biphasic            +---------------+--------+--------+----------+--------+  Distal Stent   90              biphasic            +---------------+--------+--------+----------+--------+  Distal to Stent112             biphasic            +---------------+--------+--------+----------+--------+      Left Stent(s): CIA  +---------------+--------+--------+----------+--------+  CIA           PSV cm/sStenosisWaveform  Comments  +---------------+--------+--------+----------+--------+  Proximal Stent 176             monophasic          +---------------+--------+--------+----------+--------+  Mid Stent      135             monophasic          +---------------+--------+--------+----------+--------+  Distal Stent   102             monophasic          +---------------+--------+--------+----------+--------+  Distal to Stent223             biphasic  broad102  +---------------+--------+--------+----------+--------+       Assessment/Plan:     KION HUNTSBERRY is a 73 y.o. male with CLTI and rest pain of the RLE. Now about 6 weeks s/p R iliofemoral, SFA and profunda endarterectomy with external and common iliac artery stenting.  Has completed his course of abx for cellulitis. Incision healing well. Rest pain resolved.  Plan for follow-up in 3 months with repeat ABI Instructed to call for earlier appointment if his groin  incision worsens.  Continue  Plavix, statin    Recommendations to optimize cardiovascular risk: Abstinence from all tobacco products. Blood glucose control with goal A1c < 7%. Blood pressure control with goal blood pressure < 140/90 mmHg. Lipid reduction therapy with goal LDL-C <100 mg/dL  Aspirin 81mg  PO QD.  Atorvastatin 40-80mg  PO QD (or other "high intensity" statin therapy).     [name] MD [facility]

## 2023-04-28 ENCOUNTER — Ambulatory Visit: Payer: Medicare Other | Admitting: Vascular Surgery

## 2023-04-28 ENCOUNTER — Encounter: Payer: Self-pay | Admitting: Vascular Surgery

## 2023-04-28 ENCOUNTER — Ambulatory Visit (HOSPITAL_COMMUNITY)
Admission: RE | Admit: 2023-04-28 | Discharge: 2023-04-28 | Disposition: A | Discharge status: Home / Self Care | Payer: Medicare Other | Source: Ambulatory Visit | Attending: Vascular Surgery | Admitting: Vascular Surgery

## 2023-04-28 ENCOUNTER — Ambulatory Visit (INDEPENDENT_AMBULATORY_CARE_PROVIDER_SITE_OTHER)
Admission: RE | Admit: 2023-04-28 | Discharge: 2023-04-28 | Disposition: A | Discharge status: Home / Self Care | Payer: Medicare Other | Source: Ambulatory Visit | Attending: Vascular Surgery | Admitting: Vascular Surgery

## 2023-04-28 VITALS — BP 152/75 | HR 61 | Temp 98.2°F | Resp 18 | Ht 68.0 in | Wt 192.0 lb

## 2023-04-28 DIAGNOSIS — I70219 Atherosclerosis of native arteries of extremities with intermittent claudication, unspecified extremity: Secondary | ICD-10-CM

## 2023-04-28 DIAGNOSIS — I739 Peripheral vascular disease, unspecified: Secondary | ICD-10-CM

## 2023-04-28 DIAGNOSIS — I70222 Atherosclerosis of native arteries of extremities with rest pain, left leg: Secondary | ICD-10-CM

## 2023-04-28 LAB — VAS US ABI WITH/WO TBI
Left ABI: 0.61
Right ABI: 0.5

## 2023-05-02 ENCOUNTER — Other Ambulatory Visit: Payer: Self-pay | Admitting: *Deleted

## 2023-05-02 DIAGNOSIS — I70221 Atherosclerosis of native arteries of extremities with rest pain, right leg: Secondary | ICD-10-CM

## 2023-05-02 DIAGNOSIS — I70222 Atherosclerosis of native arteries of extremities with rest pain, left leg: Secondary | ICD-10-CM

## 2023-05-02 DIAGNOSIS — I739 Peripheral vascular disease, unspecified: Secondary | ICD-10-CM

## 2023-05-19 ENCOUNTER — Other Ambulatory Visit: Payer: Self-pay

## 2023-05-19 ENCOUNTER — Other Ambulatory Visit: Payer: Self-pay | Admitting: Pharmacy Technician

## 2023-05-19 NOTE — Progress Notes (Signed)
 Specialty Pharmacy Refill Coordination Note  OBBIE RAIMER is a 72 y.o. male contacted today regarding refills of specialty medication(s) Abiraterone  Acetate (ZYTIGA )   Patient requested Delivery   Delivery date: 05/24/23   Verified address: [address] [place] [address]   Medication will be filled on 05/23/23.

## 2023-06-07 ENCOUNTER — Inpatient Hospital Stay: Payer: Medicare Other

## 2023-06-07 ENCOUNTER — Inpatient Hospital Stay: Payer: Medicare Other | Admitting: Physician Assistant

## 2023-06-15 ENCOUNTER — Telehealth: Payer: Self-pay | Admitting: Physician Assistant

## 2023-06-21 ENCOUNTER — Inpatient Hospital Stay: Admitting: Physician Assistant

## 2023-06-21 ENCOUNTER — Inpatient Hospital Stay

## 2023-06-23 ENCOUNTER — Other Ambulatory Visit: Payer: Self-pay

## 2023-06-23 ENCOUNTER — Other Ambulatory Visit: Payer: Self-pay | Admitting: Hematology and Oncology

## 2023-06-23 DIAGNOSIS — C61 Malignant neoplasm of prostate: Secondary | ICD-10-CM

## 2023-06-23 MED ORDER — ABIRATERONE ACETATE 250 MG PO TABS
1000.0000 mg | ORAL_TABLET | Freq: Every day | ORAL | 9 refills | Status: AC
  Filled 2023-06-23: qty 120, 30d supply, fill #0
  Filled 2023-07-20: qty 120, 30d supply, fill #1
  Filled 2023-08-17: qty 120, 30d supply, fill #2
  Filled 2023-09-19: qty 120, 30d supply, fill #3
  Filled 2023-10-16: qty 120, 30d supply, fill #4
  Filled 2023-11-10 – 2023-12-18 (×5): qty 120, 30d supply, fill #5
  Filled 2024-01-16 – 2024-01-23 (×3): qty 120, 30d supply, fill #6

## 2023-06-23 NOTE — Progress Notes (Signed)
 Specialty Pharmacy Refill Coordination Note  Dennis Patel is a 73 y.o. male contacted today regarding refills of specialty medication(s) Abiraterone  Acetate (ZYTIGA )   Patient requested Delivery   Delivery date: 06/27/23 Verified address: [address]  [place] [address]  Refill approval requested 06/23/23  Medication will be filled on 06/26/23.

## 2023-06-26 ENCOUNTER — Telehealth: Payer: Self-pay | Admitting: Hematology and Oncology

## 2023-06-26 ENCOUNTER — Other Ambulatory Visit: Payer: Self-pay

## 2023-06-27 ENCOUNTER — Inpatient Hospital Stay

## 2023-06-27 ENCOUNTER — Inpatient Hospital Stay: Admitting: Hematology and Oncology

## 2023-06-27 ENCOUNTER — Inpatient Hospital Stay: Attending: Internal Medicine

## 2023-06-27 VITALS — BP 147/59 | HR 60 | Temp 98.0°F | Resp 16 | Wt 190.7 lb

## 2023-06-27 DIAGNOSIS — Z808 Family history of malignant neoplasm of other organs or systems: Secondary | ICD-10-CM | POA: Insufficient documentation

## 2023-06-27 DIAGNOSIS — Z95828 Presence of other vascular implants and grafts: Secondary | ICD-10-CM

## 2023-06-27 DIAGNOSIS — Z807 Family history of other malignant neoplasms of lymphoid, hematopoietic and related tissues: Secondary | ICD-10-CM | POA: Diagnosis not present

## 2023-06-27 DIAGNOSIS — Z7952 Long term (current) use of systemic steroids: Secondary | ICD-10-CM | POA: Insufficient documentation

## 2023-06-27 DIAGNOSIS — G8929 Other chronic pain: Secondary | ICD-10-CM | POA: Insufficient documentation

## 2023-06-27 DIAGNOSIS — M25571 Pain in right ankle and joints of right foot: Secondary | ICD-10-CM | POA: Insufficient documentation

## 2023-06-27 DIAGNOSIS — Z8041 Family history of malignant neoplasm of ovary: Secondary | ICD-10-CM | POA: Insufficient documentation

## 2023-06-27 DIAGNOSIS — Z87891 Personal history of nicotine dependence: Secondary | ICD-10-CM | POA: Insufficient documentation

## 2023-06-27 DIAGNOSIS — C7951 Secondary malignant neoplasm of bone: Secondary | ICD-10-CM | POA: Diagnosis not present

## 2023-06-27 DIAGNOSIS — C61 Malignant neoplasm of prostate: Secondary | ICD-10-CM

## 2023-06-27 LAB — CBC WITH DIFFERENTIAL (CANCER CENTER ONLY)
Abs Immature Granulocytes: 0.03 10*3/uL (ref 0.00–0.07)
Basophils Absolute: 0 10*3/uL (ref 0.0–0.1)
Basophils Relative: 0 %
Eosinophils Absolute: 0.1 10*3/uL (ref 0.0–0.5)
Eosinophils Relative: 2 %
HCT: 42.3 % (ref 39.0–52.0)
Hemoglobin: 15 g/dL (ref 13.0–17.0)
Immature Granulocytes: 0 %
Lymphocytes Relative: 15 %
Lymphs Abs: 1.3 10*3/uL (ref 0.7–4.0)
MCH: 31.4 pg (ref 26.0–34.0)
MCHC: 35.5 g/dL (ref 30.0–36.0)
MCV: 88.5 fL (ref 80.0–100.0)
Monocytes Absolute: 0.6 10*3/uL (ref 0.1–1.0)
Monocytes Relative: 7 %
Neutro Abs: 6.9 10*3/uL (ref 1.7–7.7)
Neutrophils Relative %: 76 %
Platelet Count: 202 10*3/uL (ref 150–400)
RBC: 4.78 MIL/uL (ref 4.22–5.81)
RDW: 12.2 % (ref 11.5–15.5)
WBC Count: 9 10*3/uL (ref 4.0–10.5)
nRBC: 0 % (ref 0.0–0.2)

## 2023-06-27 LAB — CMP (CANCER CENTER ONLY)
ALT: 24 U/L (ref 0–44)
AST: 19 U/L (ref 15–41)
Albumin: 4.2 g/dL (ref 3.5–5.0)
Alkaline Phosphatase: 79 U/L (ref 38–126)
Anion gap: 8 (ref 5–15)
BUN: 21 mg/dL (ref 8–23)
CO2: 27 mmol/L (ref 22–32)
Calcium: 9.4 mg/dL (ref 8.9–10.3)
Chloride: 106 mmol/L (ref 98–111)
Creatinine: 1.08 mg/dL (ref 0.61–1.24)
GFR, Estimated: >60 mL/min (ref >60)
Glucose, Bld: 209 mg/dL — ABNORMAL HIGH (ref 70–99)
Potassium: 3.6 mmol/L (ref 3.5–5.1)
Sodium: 141 mmol/L (ref 135–145)
Total Bilirubin: 0.5 mg/dL (ref 0.0–1.2)
Total Protein: 6.4 g/dL — ABNORMAL LOW (ref 6.5–8.1)

## 2023-06-27 MED ORDER — LEUPROLIDE ACETATE (6 MONTH) 45 MG ~~LOC~~ KIT: 45.0000 mg | PACK | Freq: Once | SUBCUTANEOUS | Status: DC

## 2023-06-27 NOTE — Progress Notes (Signed)
 [facility] [phone]   [phone]  PROGRESS NOTE  Patient Care Team: Elida Grounds, DO as PCP - General (Family Medicine)  Hematological/Oncological History # Castration-Resistant Advanced Prostate Cancer  # Metastatic Spread to Bones  02/04/2017: Prostate biopsy showed a Gleason score of 4+4 = 8.  01/31/2018-04/2018: 5 cycles of Taxotere  75mg /m2  11/30/2021: last visit with Dr. Dirk Fredericks 03/03/2022: transfer care to Dr. Rosaline Coma   Interval History:  Dennis Patel 73 y.o. male with medical history significant for Castration-Resistant Advanced Prostate Cancer  presents for a follow up visit. The patient's last visit was on 03/15/2023. In the interim since the last visit he is continued on Eligard  and Zytiga .  On exam today Mr. Gelpi reports he underwent his endarterectomy of the right common femoral artery on 03/16/2023 and tolerated well.  He reports he has made a good recovery since then.  He reports that he can walk "further faster and longer".  He reports his energy and appetite are both quite good.  He denies any recent illnesses such as runny nose, sore throat, cough.  He reports that he is not having any new urinary symptoms but does frequently urinate 1-2 times per night.  He has been faithfully taking his Zytiga  1000 g p.o. daily.  He does have some occasional feelings of being cold with rare hot flashes.  He also has some occasional joint pain.  When he takes medication he is dizzy for about 5 minutes afterwards.  He notes that he generally takes in the morning and waits for the feeling to pass before getting on with his day.  He does have a little bit of redness at the site of the injection but otherwise tolerates his Lupron  well.  He otherwise denies any fevers, chills, sweats, nausea, vomiting or diarrhea.  A full 10 point ROS is otherwise negative.  MEDICAL HISTORY:  Past Medical History:  Diagnosis Date   Arthritis    right pinky finger    Atherosclerosis    BPH (benign prostatic hyperplasia)    Cancer (HCC)    Claudication (HCC)    Elevated PSA    Family history of adverse reaction to anesthesia    ' My Brother had an allergic reaction "   History of kidney stones    Hypertension    Peripheral arterial disease (HCC)    Peripheral artery disease (HCC)    Peripheral vascular disease (HCC)    Pre-diabetes    prostate ca with bone mets dx'd 11/2017   Pure hypercholesterolemia    Skin cancer    Multiple    SURGICAL HISTORY: Past Surgical History:  Procedure Laterality Date   ABDOMINAL ANGIOGRAM N/A 03/17/2011   Procedure: ABDOMINAL ANGIOGRAM;  Surgeon: Lucendia Rusk, MD;  Location: [facility] CATH LAB;  Service: Cardiovascular;  Laterality: N/A;   ABDOMINAL AORTOGRAM W/LOWER EXTREMITY N/A 03/17/2017   Procedure: ABDOMINAL AORTOGRAM W/LOWER EXTREMITY;  Surgeon: Dannis Dy, MD;  Location: [facility] INVASIVE CV LAB;  Service: Cardiovascular;  Laterality: N/A;   ABDOMINAL AORTOGRAM W/LOWER EXTREMITY N/A 09/08/2017   Procedure: ABDOMINAL AORTOGRAM W/LOWER EXTREMITY;  Surgeon: Dannis Dy, MD;  Location: [facility] INVASIVE CV LAB;  Service: Cardiovascular;  Laterality: N/A;   ABDOMINAL AORTOGRAM W/LOWER EXTREMITY N/A 02/13/2023   Procedure: ABDOMINAL AORTOGRAM W/LOWER EXTREMITY;  Surgeon: Philipp Brawn, MD;  Location: [facility] INVASIVE CV LAB;  Service: Cardiovascular;  Laterality: N/A;   APPLICATION OF WOUND VAC Right 03/16/2023   Procedure: APPLICATION OF WOUND VAC - PREVENA;  Surgeon: Philipp Brawn, MD;  Location: [facility] OR;  Service: Vascular;  Laterality: Right;   COLONOSCOPY  2022   ENDARTERECTOMY FEMORAL Right 03/16/2023   Procedure: ENDARTERECTOMY RIGHT COMMON FEMORAL;  Surgeon: Philipp Brawn, MD;  Location: [facility] OR;  Service: Vascular;  Laterality: Right;   ILIAC ARTERY STENT     INSERTION OF ILIAC STENT Right 03/16/2023   Procedure: INSERTION OF RIGHT COMMON ILIAC ARTERY AND RIGHT COMMON FEMORAL ARTERY STENT;   Surgeon: Philipp Brawn, MD;  Location: [facility] OR;  Service: Vascular;  Laterality: Right;   IR FLUORO GUIDE CV LINE RIGHT  12/07/2021   IR IMAGING GUIDED PORT INSERTION  01/30/2018   IR REMOVAL TUN ACCESS W/ PORT W/O FL MOD SED  12/07/2021   LOWER EXTREMITY ANGIOGRAM N/A 03/17/2011   Procedure: LOWER EXTREMITY ANGIOGRAM;  Surgeon: Lucendia Rusk, MD;  Location: [facility] CATH LAB;  Service: Cardiovascular;  Laterality: N/A;   PATCH ANGIOPLASTY Right 03/16/2023   Procedure: PATCH ANGIOPLASTY RIGHT FEMORAL ARTERY USING XENOSURE BIOLOGIC PATCH;  Surgeon: Philipp Brawn, MD;  Location: [facility] OR;  Service: Vascular;  Laterality: Right;   PERIPHERAL VASCULAR INTERVENTION Left 09/08/2017   Procedure: PERIPHERAL VASCULAR INTERVENTION;  Surgeon: Dannis Dy, MD;  Location: [facility] INVASIVE CV LAB;  Service: Cardiovascular;  Laterality: Left;  Common iliac    SOCIAL HISTORY: Social History   Socioeconomic History   Marital status: Married    Spouse name: Not on file   Number of children: Not on file   Years of education: Not on file   Highest education level: Not on file  Occupational History   Not on file  Tobacco Use   Smoking status: Former    Current packs/day: 0.00    Types: Cigarettes    Quit date: 2005    Years since quitting: 20.4   Smokeless tobacco: Never   Tobacco comments:    4-5 cigars per day. Last cigar 2/23 and is quitting cigars  Vaping Use   Vaping status: Never Used  Substance and Sexual Activity   Alcohol use: Never    Comment: 12/09/1993 quit drinking   Drug use: Never   Sexual activity: Yes  Other Topics Concern   Not on file  Social History Narrative   ** Merged History Encounter **       ** Merged History Encounter **       Social Drivers of Corporate investment banker Strain: Not on file  Food Insecurity: No Food Insecurity (03/16/2023)   Hunger Vital Sign    Worried About Running Out of Food in the Last Year: Never true    Ran Out of Food in the  Last Year: Never true  Transportation Needs: No Transportation Needs (03/16/2023)   PRAPARE - Administrator, Civil Service (Medical): No    Lack of Transportation (Non-Medical): No  Physical Activity: Not on file  Stress: Not on file  Social Connections: Moderately Integrated (03/16/2023)   Social Connection and Isolation Panel [NHANES]    Frequency of Communication with Friends and Family: More than three times a week    Frequency of Social Gatherings with Friends and Family: More than three times a week    Attends Religious Services: More than 4 times per year    Active Member of [employer] or Organizations: Yes    Attends Banker Meetings: More than 4 times per year    Marital Status: Widowed  Intimate Partner Violence: Not At Risk (03/16/2023)  Humiliation, Afraid, Rape, and Kick questionnaire    Fear of Current or Ex-Partner: No    Emotionally Abused: No    Physically Abused: No    Sexually Abused: No    FAMILY HISTORY: Family History  Problem Relation Age of Onset   Ovarian cancer Mother 90   Diabetes Mother    CAD Father    Diabetes Sister    Lymphoma Brother 85   Skin cancer Brother        basal and squamous cell   Ovarian cancer Maternal Grandmother    CAD Son    Diabetes Son    Heart disease Son    Ovarian cancer Maternal Great-grandmother     ALLERGIES:  has no known allergies.  MEDICATIONS:  Current Outpatient Medications  Medication Sig Dispense Refill   abiraterone  acetate (ZYTIGA ) 250 MG tablet Take 4 tablets (1,000 mg total) by mouth daily. Take on an empty stomach 1 hour before or 2 hours after a meal 120 tablet 9   acetaminophen  (TYLENOL ) 500 MG tablet Take 1,000 mg by mouth at bedtime.     amLODipine  (NORVASC ) 10 MG tablet Take 1 tablet (10 mg total) by mouth daily. 30 tablet 0   amoxicillin -clavulanate (AUGMENTIN ) 875-125 MG tablet Take 1 tablet by mouth 2 (two) times daily. (Patient not taking: Reported on 04/28/2023) 28 tablet 0    atorvastatin  (LIPITOR) 10 MG tablet TAKE 1 TABLET BY MOUTH EVERY DAY 90 tablet 3   calcium -vitamin D (OSCAL WITH D) 250-125 MG-UNIT tablet Take 1 tablet by mouth in the morning.     clopidogrel  (PLAVIX ) 75 MG tablet Take 1 tablet (75 mg total) by mouth daily. 90 tablet 1   famotidine  (PEPCID ) 10 MG tablet Take 10 mg by mouth in the morning.     fluconazole  (DIFLUCAN ) 150 MG tablet Take 1 tablet (150 mg total) by mouth daily. 3 tablet 0   gabapentin  (NEURONTIN ) 300 MG capsule TAKE 1 CAPSULE BY MOUTH THREE TIMES A DAY (Patient taking differently: Take 300 mg by mouth 2 (two) times daily.) 90 capsule 3   hydrochlorothiazide  (HYDRODIURIL ) 12.5 MG tablet Take 12.5 mg by mouth every morning.     Multiple Vitamins-Minerals (ONE DAILY MULTIVITAMIN MEN PO) Take 1 tablet by mouth in the morning.     naproxen sodium (ALEVE) 220 MG tablet Take 220 mg by mouth in the morning.     predniSONE  (DELTASONE ) 5 MG tablet TAKE 1 TABLET BY MOUTH EVERY DAY WITH BREAKFAST 90 tablet 3   traZODone  (DESYREL ) 50 MG tablet TAKE 1 TABLET (50 MG TOTAL) BY MOUTH AT BEDTIME AS NEEDED. FOR SLEEP (Patient taking differently: Take 100-150 mg by mouth at bedtime. for sleep) 90 tablet 1   No current facility-administered medications for this visit.    REVIEW OF SYSTEMS:   Constitutional: ( - ) fevers, ( - )  chills , ( - ) night sweats Eyes: ( - ) blurriness of vision, ( - ) double vision, ( - ) watery eyes Ears, nose, mouth, throat, and face: ( - ) mucositis, ( - ) sore throat Respiratory: ( - ) cough, ( - ) dyspnea, ( - ) wheezes Cardiovascular: ( - ) palpitation, ( - ) chest discomfort, ( - ) lower extremity swelling Gastrointestinal:  ( - ) nausea, ( - ) heartburn, ( - ) change in bowel habits Skin: ( - ) abnormal skin rashes Lymphatics: ( - ) new lymphadenopathy, ( - ) easy bruising Neurological: ( - ) numbness, ( - )  tingling, ( - ) new weaknesses Behavioral/Psych: ( - ) mood change, ( - ) new changes  All other  systems were reviewed with the patient and are negative.  PHYSICAL EXAMINATION: ECOG PERFORMANCE STATUS: 1 - Symptomatic but completely ambulatory  Vitals:   06/27/23 0858 06/27/23 0900  BP: (!) 158/61 (!) 147/59  Pulse: 60   Resp: 16   Temp: 98 F (36.7 C)   SpO2: 100%      Filed Weights   06/27/23 0858  Weight: 190 lb 11.2 oz (86.5 kg)      GENERAL: Well-appearing elderly Caucasian male, alert, no distress and comfortable SKIN: skin color, texture, turgor are normal, no rashes or significant lesions EYES: conjunctiva are pink and non-injected, sclera clear LUNGS: clear to auscultation and percussion with normal breathing effort HEART: regular rate & rhythm and no murmurs and no lower extremity edema Musculoskeletal: no cyanosis of digits and no clubbing  PSYCH: alert & oriented x 3, fluent speech NEURO: no focal motor/sensory deficits  LABORATORY DATA:  I have reviewed the data as listed    Latest Ref Rng & Units 06/27/2023    8:37 AM 03/17/2023    3:23 AM 03/16/2023    3:07 PM  CBC  WBC 4.0 - 10.5 K/uL 9.0  11.7  14.1   Hemoglobin 13.0 - 17.0 g/dL 16.1  09.6  04.5   Hematocrit 39.0 - 52.0 % 42.3  34.3  32.2   Platelets 150 - 400 K/uL 202  160  147        Latest Ref Rng & Units 06/27/2023    8:37 AM 03/17/2023    3:23 AM 03/16/2023    3:07 PM  CMP  Glucose 70 - 99 mg/dL 409  811    BUN 8 - 23 mg/dL 21  14    Creatinine 9.14 - 1.24 mg/dL 7.82  9.56  2.13   Sodium 135 - 145 mmol/L 141  139    Potassium 3.5 - 5.1 mmol/L 3.6  3.3    Chloride 98 - 111 mmol/L 106  106    CO2 22 - 32 mmol/L 27  25    Calcium  8.9 - 10.3 mg/dL 9.4  8.5    Total Protein 6.5 - 8.1 g/dL 6.4     Total Bilirubin 0.0 - 1.2 mg/dL 0.5     Alkaline Phos 38 - 126 U/L 79     AST 15 - 41 U/L 19     ALT 0 - 44 U/L 24      RADIOGRAPHIC STUDIES: No results found.   ASSESSMENT & PLAN VADEN BECHERER 73 y.o. male with medical history significant for Castration-Resistant Advanced Prostate  Cancer  presents for a follow up visit.  # Castration-Resistant Advanced Prostate Cancer  # Metastatic Spread to Bones  -- currently on Eligard  45mg  q 6 months, last dose on 02/2023. Next due August 2025  -- continue Zytiga  1000 mg daily with prednisone  5 mg daily, started in May 2020  -- Labs today show white blood cell count 9.0, Hgb 15.0, MCV 88.5, Plt 202  --PSA at 0.4 at last check in Feb 2025 -- Plan to have patient return to clinic in 3 months time for labs and clinic visit/injection   # Chronic Right ankle Pain -- Follow-up after completion of docetaxel  chemotherapy. -- Notable difference in the thickness of his left ankle and right ankle.  Right ankle appears considerably less robust. --Patient evaluated by vascular surgery, underwent right common femoral endarterectomy  on 03/16/2023.   No orders of the defined types were placed in this encounter.   All questions were answered. The patient knows to call the clinic with any problems, questions or concerns.  A total of more than 30 minutes were spent on this encounter with face-to-face time and non-face-to-face time, including preparing to see the patient, ordering tests and/or medications, counseling the patient and coordination of care as outlined above.   Rogerio Clay, MD Department of Hematology/Oncology [facility] Phone: [phone] Pager: [phone] Email: [email or web address]  06/27/2023 9:43 AM

## 2023-06-28 ENCOUNTER — Other Ambulatory Visit: Payer: Self-pay

## 2023-06-28 LAB — PROSTATE-SPECIFIC AG, SERUM (LABCORP): Prostate Specific Ag, Serum: 0.7 ng/mL (ref 0.0–4.0)

## 2023-06-28 LAB — TESTOSTERONE: Testosterone: <3 ng/dL — ABNORMAL LOW (ref 264–916)

## 2023-07-08 ENCOUNTER — Encounter: Payer: Self-pay | Admitting: Hematology and Oncology

## 2023-07-20 ENCOUNTER — Other Ambulatory Visit: Payer: Self-pay | Admitting: Pharmacy Technician

## 2023-07-20 ENCOUNTER — Other Ambulatory Visit: Payer: Self-pay

## 2023-07-20 NOTE — Progress Notes (Signed)
 Specialty Pharmacy Refill Coordination Note  Dennis Patel is a 73 y.o. male contacted today regarding refills of specialty medication(s) Abiraterone  Acetate (ZYTIGA )   Patient requested Delivery   Delivery date: 07/31/23   Verified address: [address]  [place] [address]   Medication will be filled on 07/28/23.

## 2023-07-28 ENCOUNTER — Other Ambulatory Visit: Payer: Self-pay

## 2023-08-09 NOTE — Progress Notes (Unsigned)
 Patient ID: Dennis Patel, male   DOB: [birth year], 73 y.o.   MRN: [id number]  Reason for Consult: No chief complaint on file.   Referred by Auston Opal, DO  Subjective:     HPI Dennis Patel is a 73 y.o. male presenting for follow-up. On 03/16/2023 he underwent right iliofemoral, profunda and SFA endarterectomy, stenting of right EIA and CIA for rest pain.  He reports his rest pain has resolved.  ***  Past Medical History:  Diagnosis Date   Arthritis    right pinky finger   Atherosclerosis    BPH (benign prostatic hyperplasia)    Cancer (HCC)    Claudication (HCC)    Elevated PSA    Family history of adverse reaction to anesthesia    ' My Brother had an allergic reaction    History of kidney stones    Hypertension    Peripheral arterial disease (HCC)    Peripheral artery disease (HCC)    Peripheral vascular disease (HCC)    Pre-diabetes    prostate ca with bone mets dx'd 11/2017   Pure hypercholesterolemia    Skin cancer    Multiple   Family History  Problem Relation Age of Onset   Ovarian cancer Mother 2   Diabetes Mother    CAD Father    Diabetes Sister    Lymphoma Brother 45   Skin cancer Brother        basal and squamous cell   Ovarian cancer Maternal Grandmother    CAD Son    Diabetes Son    Heart disease Son    Ovarian cancer Maternal Great-grandmother    Past Surgical History:  Procedure Laterality Date   ABDOMINAL ANGIOGRAM N/A 03/17/2011   Procedure: ABDOMINAL ANGIOGRAM;  Surgeon: Candyce GORMAN Reek, MD;  Location: [facility] CATH LAB;  Service: Cardiovascular;  Laterality: N/A;   ABDOMINAL AORTOGRAM W/LOWER EXTREMITY N/A 03/17/2017   Procedure: ABDOMINAL AORTOGRAM W/LOWER EXTREMITY;  Surgeon: Eliza Lonni GORMAN, MD;  Location: [facility] INVASIVE CV LAB;  Service: Cardiovascular;  Laterality: N/A;   ABDOMINAL AORTOGRAM W/LOWER EXTREMITY N/A 09/08/2017   Procedure: ABDOMINAL AORTOGRAM W/LOWER EXTREMITY;  Surgeon: Eliza Lonni GORMAN, MD;  Location:  [facility] INVASIVE CV LAB;  Service: Cardiovascular;  Laterality: N/A;   ABDOMINAL AORTOGRAM W/LOWER EXTREMITY N/A 02/13/2023   Procedure: ABDOMINAL AORTOGRAM W/LOWER EXTREMITY;  Surgeon: Pearline Norman GORMAN, MD;  Location: [facility] INVASIVE CV LAB;  Service: Cardiovascular;  Laterality: N/A;   APPLICATION OF WOUND VAC Right 03/16/2023   Procedure: APPLICATION OF WOUND VAC - PREVENA;  Surgeon: Pearline Norman GORMAN, MD;  Location: [facility] OR;  Service: Vascular;  Laterality: Right;   COLONOSCOPY  2022   ENDARTERECTOMY FEMORAL Right 03/16/2023   Procedure: ENDARTERECTOMY RIGHT COMMON FEMORAL;  Surgeon: Pearline Norman GORMAN, MD;  Location: [facility] OR;  Service: Vascular;  Laterality: Right;   ILIAC ARTERY STENT     INSERTION OF ILIAC STENT Right 03/16/2023   Procedure: INSERTION OF RIGHT COMMON ILIAC ARTERY AND RIGHT COMMON FEMORAL ARTERY STENT;  Surgeon: Pearline Norman GORMAN, MD;  Location: [facility] OR;  Service: Vascular;  Laterality: Right;   IR FLUORO GUIDE CV LINE RIGHT  12/07/2021   IR IMAGING GUIDED PORT INSERTION  01/30/2018   IR REMOVAL TUN ACCESS W/ PORT W/O FL MOD SED  12/07/2021   LOWER EXTREMITY ANGIOGRAM N/A 03/17/2011   Procedure: LOWER EXTREMITY ANGIOGRAM;  Surgeon: Candyce GORMAN Reek, MD;  Location: [facility] CATH LAB;  Service: Cardiovascular;  Laterality: N/A;   PATCH ANGIOPLASTY Right  03/16/2023   Procedure: PATCH ANGIOPLASTY RIGHT FEMORAL ARTERY USING GEORGE BIOLOGIC PATCH;  Surgeon: Pearline Norman RAMAN, MD;  Location: [facility] OR;  Service: Vascular;  Laterality: Right;   PERIPHERAL VASCULAR INTERVENTION Left 09/08/2017   Procedure: PERIPHERAL VASCULAR INTERVENTION;  Surgeon: Eliza Lonni RAMAN, MD;  Location: [facility] INVASIVE CV LAB;  Service: Cardiovascular;  Laterality: Left;  Common iliac    Short Social History:  Social History   Tobacco Use   Smoking status: Former    Current packs/day: 0.00    Types: Cigarettes    Quit date: 2005    Years since quitting: 20.5   Smokeless tobacco: Never   Tobacco comments:    4-5 cigars  per day. Last cigar 2/23 and is quitting cigars  Substance Use Topics   Alcohol use: Never    Comment: 12/09/1993 quit drinking    No Known Allergies  Current Outpatient Medications  Medication Sig Dispense Refill   abiraterone  acetate (ZYTIGA ) 250 MG tablet Take 4 tablets (1,000 mg total) by mouth daily. Take on an empty stomach 1 hour before or 2 hours after a meal 120 tablet 9   acetaminophen  (TYLENOL ) 500 MG tablet Take 1,000 mg by mouth at bedtime.     amLODipine  (NORVASC ) 10 MG tablet Take 1 tablet (10 mg total) by mouth daily. 30 tablet 0   amoxicillin -clavulanate (AUGMENTIN ) 875-125 MG tablet Take 1 tablet by mouth 2 (two) times daily. (Patient not taking: Reported on 04/28/2023) 28 tablet 0   atorvastatin  (LIPITOR) 10 MG tablet TAKE 1 TABLET BY MOUTH EVERY DAY 90 tablet 3   calcium -vitamin D (OSCAL WITH D) 250-125 MG-UNIT tablet Take 1 tablet by mouth in the morning.     clopidogrel  (PLAVIX ) 75 MG tablet Take 1 tablet (75 mg total) by mouth daily. 90 tablet 1   famotidine  (PEPCID ) 10 MG tablet Take 10 mg by mouth in the morning.     fluconazole  (DIFLUCAN ) 150 MG tablet Take 1 tablet (150 mg total) by mouth daily. 3 tablet 0   gabapentin  (NEURONTIN ) 300 MG capsule TAKE 1 CAPSULE BY MOUTH THREE TIMES A DAY (Patient taking differently: Take 300 mg by mouth 2 (two) times daily.) 90 capsule 3   hydrochlorothiazide  (HYDRODIURIL ) 12.5 MG tablet Take 12.5 mg by mouth every morning.     Multiple Vitamins-Minerals (ONE DAILY MULTIVITAMIN MEN PO) Take 1 tablet by mouth in the morning.     naproxen sodium (ALEVE) 220 MG tablet Take 220 mg by mouth in the morning.     predniSONE  (DELTASONE ) 5 MG tablet TAKE 1 TABLET BY MOUTH EVERY DAY WITH BREAKFAST 90 tablet 3   traZODone  (DESYREL ) 50 MG tablet TAKE 1 TABLET (50 MG TOTAL) BY MOUTH AT BEDTIME AS NEEDED. FOR SLEEP (Patient taking differently: Take 100-150 mg by mouth at bedtime. for sleep) 90 tablet 1   No current facility-administered  medications for this visit.    REVIEW OF SYSTEMS  All other systems were reviewed and are negative     Objective:  Objective   There were no vitals filed for this visit. There is no height or weight on file to calculate BMI.  Physical Exam General: no acute distress Cardiac: hemodynamically stable Pulm: normal work of breathing Neuro: alert, no focal deficit Extremities: no edema, cyanosis or wounds*** Vascular:   Right: ***  Left: ***  Data: ABI ***  Aortoiliac duplex ***     Assessment/Plan:   Dennis Patel is a 73 y.o. male with CLTI and rest pain  of the RLE. Now about 6 weeks s/p R iliofemoral, SFA and profunda endarterectomy with external and common iliac artery stenting.  Noninvasive studies demonstrated ***  Recommendations to optimize cardiovascular risk: Abstinence from all tobacco products. Blood glucose control with goal A1c < 7%. Blood pressure control with goal blood pressure < 140/90 mmHg. Lipid reduction therapy with goal LDL-C <100 mg/dL  Aspirin  81mg  PO QD.  Atorvastatin  40-80mg  PO QD (or other high intensity statin therapy).   Norman GORMAN Serve MD [facility]

## 2023-08-11 ENCOUNTER — Encounter: Payer: Self-pay | Admitting: Vascular Surgery

## 2023-08-11 ENCOUNTER — Other Ambulatory Visit: Payer: Self-pay

## 2023-08-11 ENCOUNTER — Ambulatory Visit (HOSPITAL_COMMUNITY)
Admission: RE | Admit: 2023-08-11 | Discharge: 2023-08-11 | Disposition: A | Discharge status: Home / Self Care | Source: Ambulatory Visit | Attending: Vascular Surgery | Admitting: Vascular Surgery

## 2023-08-11 ENCOUNTER — Ambulatory Visit: Attending: Vascular Surgery | Admitting: Vascular Surgery

## 2023-08-11 ENCOUNTER — Ambulatory Visit (HOSPITAL_BASED_OUTPATIENT_CLINIC_OR_DEPARTMENT_OTHER)
Admission: RE | Admit: 2023-08-11 | Discharge: 2023-08-11 | Disposition: A | Discharge status: Home / Self Care | Source: Ambulatory Visit | Attending: Vascular Surgery | Admitting: Vascular Surgery

## 2023-08-11 VITALS — BP 146/75 | HR 56 | Temp 98.0°F | Resp 16 | Ht 68.0 in | Wt 188.7 lb

## 2023-08-11 DIAGNOSIS — I70221 Atherosclerosis of native arteries of extremities with rest pain, right leg: Secondary | ICD-10-CM

## 2023-08-11 DIAGNOSIS — I739 Peripheral vascular disease, unspecified: Secondary | ICD-10-CM | POA: Diagnosis not present

## 2023-08-11 DIAGNOSIS — I70219 Atherosclerosis of native arteries of extremities with intermittent claudication, unspecified extremity: Secondary | ICD-10-CM | POA: Diagnosis not present

## 2023-08-11 DIAGNOSIS — I70222 Atherosclerosis of native arteries of extremities with rest pain, left leg: Secondary | ICD-10-CM | POA: Diagnosis not present

## 2023-08-11 LAB — VAS US ABI WITH/WO TBI
Left ABI: 0.61
Right ABI: 0.53

## 2023-08-15 ENCOUNTER — Other Ambulatory Visit: Payer: Self-pay | Admitting: Hematology and Oncology

## 2023-08-17 ENCOUNTER — Other Ambulatory Visit: Payer: Self-pay

## 2023-08-17 NOTE — Progress Notes (Signed)
 Specialty Pharmacy Ongoing Clinical Assessment Note  Dennis Patel is a 73 y.o. male who is being followed by the specialty pharmacy service for RxSp Oncology   Patient's specialty medication(s) reviewed today: Abiraterone  Acetate (ZYTIGA )   Missed doses in the last 4 weeks: 0   Patient/Caregiver did not have any additional questions or concerns.   Therapeutic benefit summary: Patient is achieving benefit   Adverse events/side effects summary: Experienced adverse events/side effects (hot flashes and occasional headache, both tolerable at this time.)   Patient's therapy is appropriate to: Continue    Goals Addressed             This Visit's Progress    Slow Disease Progression   On track    Patient is on track. Patient will maintain adherence and be monitored by provider to determine if a change in treatment plan is warranted. PSA has increased from 0.2 in November 2024 to 0.7 on 06/27/23. Dr. Federico is aware of slow increase and monitoring carefully.         Follow up: 6 months  [facility]

## 2023-08-17 NOTE — Progress Notes (Signed)
 Specialty Pharmacy Refill Coordination Note  Dennis Patel is a 73 y.o. male contacted today regarding refills of specialty medication(s) Abiraterone  Acetate (ZYTIGA )   Patient requested Delivery   Delivery date: 08/23/23   Verified address: [address]  [place] [address]   Medication will be filled on 08/22/23.

## 2023-08-23 ENCOUNTER — Inpatient Hospital Stay

## 2023-08-23 ENCOUNTER — Inpatient Hospital Stay: Admitting: Physician Assistant

## 2023-08-30 ENCOUNTER — Inpatient Hospital Stay: Payer: Medicare Other

## 2023-08-30 ENCOUNTER — Inpatient Hospital Stay: Payer: Medicare Other | Admitting: Hematology and Oncology

## 2023-09-13 ENCOUNTER — Inpatient Hospital Stay

## 2023-09-13 ENCOUNTER — Inpatient Hospital Stay: Attending: Internal Medicine

## 2023-09-13 ENCOUNTER — Inpatient Hospital Stay: Admitting: Hematology and Oncology

## 2023-09-13 VITALS — BP 141/71 | HR 56 | Temp 97.7°F | Resp 13 | Wt 188.5 lb

## 2023-09-13 DIAGNOSIS — Z7952 Long term (current) use of systemic steroids: Secondary | ICD-10-CM | POA: Diagnosis not present

## 2023-09-13 DIAGNOSIS — Z5111 Encounter for antineoplastic chemotherapy: Secondary | ICD-10-CM | POA: Insufficient documentation

## 2023-09-13 DIAGNOSIS — E7800 Pure hypercholesterolemia, unspecified: Secondary | ICD-10-CM | POA: Diagnosis not present

## 2023-09-13 DIAGNOSIS — I10 Essential (primary) hypertension: Secondary | ICD-10-CM | POA: Insufficient documentation

## 2023-09-13 DIAGNOSIS — E291 Testicular hypofunction: Secondary | ICD-10-CM | POA: Diagnosis not present

## 2023-09-13 DIAGNOSIS — C61 Malignant neoplasm of prostate: Secondary | ICD-10-CM | POA: Diagnosis not present

## 2023-09-13 DIAGNOSIS — F17290 Nicotine dependence, other tobacco product, uncomplicated: Secondary | ICD-10-CM | POA: Diagnosis not present

## 2023-09-13 DIAGNOSIS — C7951 Secondary malignant neoplasm of bone: Secondary | ICD-10-CM | POA: Insufficient documentation

## 2023-09-13 DIAGNOSIS — Z95828 Presence of other vascular implants and grafts: Secondary | ICD-10-CM

## 2023-09-13 DIAGNOSIS — Z7902 Long term (current) use of antithrombotics/antiplatelets: Secondary | ICD-10-CM | POA: Diagnosis not present

## 2023-09-13 DIAGNOSIS — Z79899 Other long term (current) drug therapy: Secondary | ICD-10-CM | POA: Diagnosis not present

## 2023-09-13 LAB — CBC WITH DIFFERENTIAL (CANCER CENTER ONLY)
Abs Immature Granulocytes: 0.05 K/uL (ref 0.00–0.07)
Basophils Absolute: 0 K/uL (ref 0.0–0.1)
Basophils Relative: 0 %
Eosinophils Absolute: 0.1 K/uL (ref 0.0–0.5)
Eosinophils Relative: 1 %
HCT: 45.5 % (ref 39.0–52.0)
Hemoglobin: 16.2 g/dL (ref 13.0–17.0)
Immature Granulocytes: 1 %
Lymphocytes Relative: 13 %
Lymphs Abs: 1.4 K/uL (ref 0.7–4.0)
MCH: 31.9 pg (ref 26.0–34.0)
MCHC: 35.6 g/dL (ref 30.0–36.0)
MCV: 89.6 fL (ref 80.0–100.0)
Monocytes Absolute: 0.8 K/uL (ref 0.1–1.0)
Monocytes Relative: 8 %
Neutro Abs: 7.8 K/uL — ABNORMAL HIGH (ref 1.7–7.7)
Neutrophils Relative %: 77 %
Platelet Count: 219 K/uL (ref 150–400)
RBC: 5.08 MIL/uL (ref 4.22–5.81)
RDW: 12.1 % (ref 11.5–15.5)
WBC Count: 10.2 K/uL (ref 4.0–10.5)
nRBC: 0 % (ref 0.0–0.2)

## 2023-09-13 LAB — CMP (CANCER CENTER ONLY)
ALT: 22 U/L (ref 0–44)
AST: 19 U/L (ref 15–41)
Albumin: 4.4 g/dL (ref 3.5–5.0)
Alkaline Phosphatase: 86 U/L (ref 38–126)
Anion gap: 8 (ref 5–15)
BUN: 23 mg/dL (ref 8–23)
CO2: 28 mmol/L (ref 22–32)
Calcium: 9.9 mg/dL (ref 8.9–10.3)
Chloride: 105 mmol/L (ref 98–111)
Creatinine: 1.08 mg/dL (ref 0.61–1.24)
GFR, Estimated: >60 mL/min (ref >60)
Glucose, Bld: 146 mg/dL — ABNORMAL HIGH (ref 70–99)
Potassium: 3.8 mmol/L (ref 3.5–5.1)
Sodium: 141 mmol/L (ref 135–145)
Total Bilirubin: 0.7 mg/dL (ref 0.0–1.2)
Total Protein: 6.8 g/dL (ref 6.5–8.1)

## 2023-09-13 MED ORDER — LEUPROLIDE ACETATE (6 MONTH) 45 MG ~~LOC~~ KIT
45.0000 mg | PACK | Freq: Once | SUBCUTANEOUS | Status: AC
  Administered 2023-09-13: 45 mg via SUBCUTANEOUS
  Filled 2023-09-13: qty 45

## 2023-09-13 NOTE — Progress Notes (Signed)
 [facility] [phone]   [phone]  PROGRESS NOTE  Patient Care Team: Auston Opal, DO as PCP - General (Family Medicine)  Hematological/Oncological History # Castration-Resistant Advanced Prostate Cancer  # Metastatic Spread to Bones  02/04/2017: Prostate biopsy showed a Gleason score of 4+4 = 8.  01/31/2018-04/2018: 5 cycles of Taxotere  75mg /m2  11/30/2021: last visit with Dr. Amadeo 03/03/2022: transfer care to Dr. Federico   Interval History:  Dennis Patel 73 y.o. male with medical history significant for Castration-Resistant Advanced Prostate Cancer presents for a follow up visit. The patient's last visit was on 06/27/2023. In the interim since the last visit he is continued on Eligard  and Zytiga .  On exam today Dennis Patel reports he has been well overall in the interim since her last visit.  He reports he is going to the gym about 3 days/week.  He reports his energy levels have been dropping however.  He is also been having some issues with hot flashes.  He reports that they occur periodically but sometimes they are little more tough to deal with.  He reports he is taking Zytiga  1000 mg p.o. daily without difficulty.  He is taking with prednisone  as prescribed.  He reports he does have some occasional bouts of headaches but they are not severe.  He notes that he does get some soreness after his Lupron  shot but otherwise tolerates it quite well.  He notes he is not having any pain elsewhere such as bone pain or back pain.  His appetite remains strong.  Overall he is willing and able to continue on his current treatment.  At our last visit his PSA was trending up slightly.  Today we discussed options for how to approach continually rising PSA.  Details of our conversation are noted below.  MEDICAL HISTORY:  Past Medical History:  Diagnosis Date   Arthritis    right pinky finger   Atherosclerosis    BPH (benign prostatic hyperplasia)    Cancer  (HCC)    Claudication (HCC)    Elevated PSA    Family history of adverse reaction to anesthesia    ' My Brother had an allergic reaction    History of kidney stones    Hypertension    Peripheral arterial disease (HCC)    Peripheral artery disease (HCC)    Peripheral vascular disease (HCC)    Pre-diabetes    prostate ca with bone mets dx'd 11/2017   Pure hypercholesterolemia    Skin cancer    Multiple    SURGICAL HISTORY: Past Surgical History:  Procedure Laterality Date   ABDOMINAL ANGIOGRAM N/A 03/17/2011   Procedure: ABDOMINAL ANGIOGRAM;  Surgeon: Candyce GORMAN Reek, MD;  Location: [facility] CATH LAB;  Service: Cardiovascular;  Laterality: N/A;   ABDOMINAL AORTOGRAM W/LOWER EXTREMITY N/A 03/17/2017   Procedure: ABDOMINAL AORTOGRAM W/LOWER EXTREMITY;  Surgeon: Eliza Lonni GORMAN, MD;  Location: [facility] INVASIVE CV LAB;  Service: Cardiovascular;  Laterality: N/A;   ABDOMINAL AORTOGRAM W/LOWER EXTREMITY N/A 09/08/2017   Procedure: ABDOMINAL AORTOGRAM W/LOWER EXTREMITY;  Surgeon: Eliza Lonni GORMAN, MD;  Location: [facility] INVASIVE CV LAB;  Service: Cardiovascular;  Laterality: N/A;   ABDOMINAL AORTOGRAM W/LOWER EXTREMITY N/A 02/13/2023   Procedure: ABDOMINAL AORTOGRAM W/LOWER EXTREMITY;  Surgeon: Pearline Norman GORMAN, MD;  Location: [facility] INVASIVE CV LAB;  Service: Cardiovascular;  Laterality: N/A;   APPLICATION OF WOUND VAC Right 03/16/2023   Procedure: APPLICATION OF WOUND VAC - PREVENA;  Surgeon: Pearline Norman GORMAN, MD;  Location: [facility] OR;  Service: Vascular;  Laterality: Right;   COLONOSCOPY  2022   ENDARTERECTOMY FEMORAL Right 03/16/2023   Procedure: ENDARTERECTOMY RIGHT COMMON FEMORAL;  Surgeon: Pearline Norman RAMAN, MD;  Location: [facility] OR;  Service: Vascular;  Laterality: Right;   ILIAC ARTERY STENT     INSERTION OF ILIAC STENT Right 03/16/2023   Procedure: INSERTION OF RIGHT COMMON ILIAC ARTERY AND RIGHT COMMON FEMORAL ARTERY STENT;  Surgeon: Pearline Norman RAMAN, MD;  Location: [facility] OR;  Service: Vascular;   Laterality: Right;   IR FLUORO GUIDE CV LINE RIGHT  12/07/2021   IR IMAGING GUIDED PORT INSERTION  01/30/2018   IR REMOVAL TUN ACCESS W/ PORT W/O FL MOD SED  12/07/2021   LOWER EXTREMITY ANGIOGRAM N/A 03/17/2011   Procedure: LOWER EXTREMITY ANGIOGRAM;  Surgeon: Candyce RAMAN Reek, MD;  Location: [facility] CATH LAB;  Service: Cardiovascular;  Laterality: N/A;   PATCH ANGIOPLASTY Right 03/16/2023   Procedure: PATCH ANGIOPLASTY RIGHT FEMORAL ARTERY USING XENOSURE BIOLOGIC PATCH;  Surgeon: Pearline Norman RAMAN, MD;  Location: [facility] OR;  Service: Vascular;  Laterality: Right;   PERIPHERAL VASCULAR INTERVENTION Left 09/08/2017   Procedure: PERIPHERAL VASCULAR INTERVENTION;  Surgeon: Eliza Lonni RAMAN, MD;  Location: [facility] INVASIVE CV LAB;  Service: Cardiovascular;  Laterality: Left;  Common iliac    SOCIAL HISTORY: Social History   Socioeconomic History   Marital status: Married    Spouse name: Not on file   Number of children: Not on file   Years of education: Not on file   Highest education level: Not on file  Occupational History   Not on file  Tobacco Use   Smoking status: Former    Current packs/day: 0.00    Types: Cigarettes    Quit date: 2005    Years since quitting: 20.6   Smokeless tobacco: Never   Tobacco comments:    4-5 cigars per day. Last cigar 2/23 and is quitting cigars  Vaping Use   Vaping status: Never Used  Substance and Sexual Activity   Alcohol use: Never    Comment: 12/09/1993 quit drinking   Drug use: Never   Sexual activity: Yes  Other Topics Concern   Not on file  Social History Narrative   ** Merged History Encounter **       ** Merged History Encounter **       Social Drivers of Corporate investment banker Strain: Not on file  Food Insecurity: No Food Insecurity (03/16/2023)   Hunger Vital Sign    Worried About Running Out of Food in the Last Year: Never true    Ran Out of Food in the Last Year: Never true  Transportation Needs: No Transportation Needs  (03/16/2023)   PRAPARE - Administrator, Civil Service (Medical): No    Lack of Transportation (Non-Medical): No  Physical Activity: Not on file  Stress: Not on file  Social Connections: Moderately Integrated (03/16/2023)   Social Connection and Isolation Panel    Frequency of Communication with Friends and Family: More than three times a week    Frequency of Social Gatherings with Friends and Family: More than three times a week    Attends Religious Services: More than 4 times per year    Active Member of [employer] or Organizations: Yes    Attends Banker Meetings: More than 4 times per year    Marital Status: Widowed  Intimate Partner Violence: Not At Risk (03/16/2023)   Humiliation, Afraid, Rape, and Kick questionnaire  Fear of Current or Ex-Partner: No    Emotionally Abused: No    Physically Abused: No    Sexually Abused: No    FAMILY HISTORY: Family History  Problem Relation Age of Onset   Ovarian cancer Mother 31   Diabetes Mother    CAD Father    Diabetes Sister    Lymphoma Brother 11   Skin cancer Brother        basal and squamous cell   Ovarian cancer Maternal Grandmother    CAD Son    Diabetes Son    Heart disease Son    Ovarian cancer Maternal Great-grandmother     ALLERGIES:  has no known allergies.  MEDICATIONS:  Current Outpatient Medications  Medication Sig Dispense Refill   abiraterone  acetate (ZYTIGA ) 250 MG tablet Take 4 tablets (1,000 mg total) by mouth daily. Take on an empty stomach 1 hour before or 2 hours after a meal 120 tablet 9   acetaminophen  (TYLENOL ) 500 MG tablet Take 1,000 mg by mouth at bedtime.     amLODipine  (NORVASC ) 10 MG tablet Take 1 tablet (10 mg total) by mouth daily. 30 tablet 0   amoxicillin -clavulanate (AUGMENTIN ) 875-125 MG tablet Take 1 tablet by mouth 2 (two) times daily. 28 tablet 0   atorvastatin  (LIPITOR) 10 MG tablet TAKE 1 TABLET BY MOUTH EVERY DAY 90 tablet 3   calcium -vitamin D (OSCAL WITH D)  250-125 MG-UNIT tablet Take 1 tablet by mouth in the morning.     clopidogrel  (PLAVIX ) 75 MG tablet Take 1 tablet (75 mg total) by mouth daily. 90 tablet 1   famotidine  (PEPCID ) 10 MG tablet Take 10 mg by mouth in the morning.     fluconazole  (DIFLUCAN ) 150 MG tablet Take 1 tablet (150 mg total) by mouth daily. 3 tablet 0   gabapentin  (NEURONTIN ) 300 MG capsule Take 1 capsule (300 mg total) by mouth 2 (two) times daily. 180 capsule 1   hydrochlorothiazide  (HYDRODIURIL ) 12.5 MG tablet Take 12.5 mg by mouth every morning.     Multiple Vitamins-Minerals (ONE DAILY MULTIVITAMIN MEN PO) Take 1 tablet by mouth in the morning.     naproxen sodium (ALEVE) 220 MG tablet Take 220 mg by mouth in the morning.     predniSONE  (DELTASONE ) 5 MG tablet TAKE 1 TABLET BY MOUTH EVERY DAY WITH BREAKFAST 90 tablet 3   traZODone  (DESYREL ) 50 MG tablet TAKE 1 TABLET (50 MG TOTAL) BY MOUTH AT BEDTIME AS NEEDED. FOR SLEEP (Patient taking differently: Take 100-150 mg by mouth at bedtime. for sleep) 90 tablet 1   No current facility-administered medications for this visit.    REVIEW OF SYSTEMS:   Constitutional: ( - ) fevers, ( - )  chills , ( - ) night sweats Eyes: ( - ) blurriness of vision, ( - ) double vision, ( - ) watery eyes Ears, nose, mouth, throat, and face: ( - ) mucositis, ( - ) sore throat Respiratory: ( - ) cough, ( - ) dyspnea, ( - ) wheezes Cardiovascular: ( - ) palpitation, ( - ) chest discomfort, ( - ) lower extremity swelling Gastrointestinal:  ( - ) nausea, ( - ) heartburn, ( - ) change in bowel habits Skin: ( - ) abnormal skin rashes Lymphatics: ( - ) new lymphadenopathy, ( - ) easy bruising Neurological: ( - ) numbness, ( - ) tingling, ( - ) new weaknesses Behavioral/Psych: ( - ) mood change, ( - ) new changes  All other systems were reviewed  with the patient and are negative.  PHYSICAL EXAMINATION: ECOG PERFORMANCE STATUS: 1 - Symptomatic but completely ambulatory  There were no vitals filed  for this visit.    There were no vitals filed for this visit.     GENERAL: Well-appearing elderly Caucasian male, alert, no distress and comfortable SKIN: skin color, texture, turgor are normal, no rashes or significant lesions EYES: conjunctiva are pink and non-injected, sclera clear LUNGS: clear to auscultation and percussion with normal breathing effort HEART: regular rate & rhythm and no murmurs and no lower extremity edema Musculoskeletal: no cyanosis of digits and no clubbing  PSYCH: alert & oriented x 3, fluent speech NEURO: no focal motor/sensory deficits  LABORATORY DATA:  I have reviewed the data as listed    Latest Ref Rng & Units 06/27/2023    8:37 AM 03/17/2023    3:23 AM 03/16/2023    3:07 PM  CBC  WBC 4.0 - 10.5 K/uL 9.0  11.7  14.1   Hemoglobin 13.0 - 17.0 g/dL 84.9  87.6  88.8   Hematocrit 39.0 - 52.0 % 42.3  34.3  32.2   Platelets 150 - 400 K/uL 202  160  147        Latest Ref Rng & Units 06/27/2023    8:37 AM 03/17/2023    3:23 AM 03/16/2023    3:07 PM  CMP  Glucose 70 - 99 mg/dL 790  881    BUN 8 - 23 mg/dL 21  14    Creatinine 9.38 - 1.24 mg/dL 8.91  8.93  9.16   Sodium 135 - 145 mmol/L 141  139    Potassium 3.5 - 5.1 mmol/L 3.6  3.3    Chloride 98 - 111 mmol/L 106  106    CO2 22 - 32 mmol/L 27  25    Calcium  8.9 - 10.3 mg/dL 9.4  8.5    Total Protein 6.5 - 8.1 g/dL 6.4     Total Bilirubin 0.0 - 1.2 mg/dL 0.5     Alkaline Phos 38 - 126 U/L 79     AST 15 - 41 U/L 19     ALT 0 - 44 U/L 24      RADIOGRAPHIC STUDIES: No results found.   ASSESSMENT & PLAN Dennis Patel 73 y.o. male with medical history significant for Castration-Resistant Advanced Prostate Cancer  presents for a follow up visit.  # Castration-Resistant Advanced Prostate Cancer  # Metastatic Spread to Bones  -- currently on Eligard  45mg  q 6 months, last dose on 02/2023. Next due today. Due again in Nov 2025.  -- continue Zytiga  1000 mg daily with prednisone  5 mg daily, started  in May 2020  -- Labs today show white blood cell count 10.2, Hgb 16.2, MCV 89.6, Plt 219  --PSA at 0.7 at last check in June 2025. If PSA is continuing to rise today will order new imaging and discuss transitioning to another treatment.  -- Plan to have patient return to clinic in 3 months time for labs and clinic visit/injection   # Chronic Right ankle Pain -- Follow-up after completion of docetaxel  chemotherapy. -- Notable difference in the thickness of his left ankle and right ankle.  Right ankle appears considerably less robust. --Patient evaluated by vascular surgery, underwent right common femoral endarterectomy on 03/16/2023.   No orders of the defined types were placed in this encounter.   All questions were answered. The patient knows to call the clinic with any problems, questions or  concerns.  A total of more than 30 minutes were spent on this encounter with face-to-face time and non-face-to-face time, including preparing to see the patient, ordering tests and/or medications, counseling the patient and coordination of care as outlined above.   Norleen IVAR Kidney, MD Department of Hematology/Oncology [facility] at [facility] Phone: [phone] Pager: [phone] Email: [email or web address]  09/13/2023 7:48 AM

## 2023-09-14 LAB — PROSTATE-SPECIFIC AG, SERUM (LABCORP): Prostate Specific Ag, Serum: 1.1 ng/mL (ref 0.0–4.0)

## 2023-09-14 LAB — TESTOSTERONE: Testosterone: <3 ng/dL — ABNORMAL LOW (ref 264–916)

## 2023-09-16 ENCOUNTER — Encounter: Payer: Self-pay | Admitting: Hematology and Oncology

## 2023-09-19 ENCOUNTER — Other Ambulatory Visit: Payer: Self-pay

## 2023-09-19 NOTE — Progress Notes (Signed)
 Specialty Pharmacy Refill Coordination Note  Dennis Patel is a 73 y.o. male contacted today regarding refills of specialty medication(s) Abiraterone  Acetate (ZYTIGA )   Patient requested Delivery   Delivery date: 09/22/23   Verified address: [address]  [place] [address]   Medication will be filled on 09/21/23.

## 2023-09-20 ENCOUNTER — Ambulatory Visit: Payer: Self-pay | Admitting: *Deleted

## 2023-09-20 NOTE — Telephone Encounter (Signed)
 TCT patient regarding recent lab results.  Spoke with pt. Advised that his PSA increased slightly to 1.1.  Given his increasing PSA. Dr. Federico recommends we order a PSMA scan to evaluate for progression of disease.  Pending the results of  the scan we will bring him back to clinic to discuss the next apps. Pt voiced understanding. His scan is scheduled for Friday, 09/22/23

## 2023-09-20 NOTE — Telephone Encounter (Signed)
-----   Message from Norleen ONEIDA Kidney IV sent at 09/16/2023  6:29 PM EDT ----- Please let Mr. Schnoor know that his PSA increased slightly to 1.1.  Given his increasing PSA I would recommend we order a PSMA scan to evaluate for progression of disease.  Pending the results of  the scan we will bring him back to clinic to discuss the next apps. ----- Message ----- From: Interface, Lab In [place] Sent: 09/13/2023   9:36 AM EDT To: Norleen ONEIDA Kidney [place], MD

## 2023-09-21 ENCOUNTER — Other Ambulatory Visit: Payer: Self-pay

## 2023-09-22 ENCOUNTER — Encounter (HOSPITAL_COMMUNITY)
Admission: RE | Admit: 2023-09-22 | Discharge: 2023-09-22 | Disposition: A | Discharge status: Home / Self Care | Source: Ambulatory Visit | Attending: Hematology and Oncology | Admitting: Hematology and Oncology

## 2023-09-22 DIAGNOSIS — C61 Malignant neoplasm of prostate: Secondary | ICD-10-CM | POA: Insufficient documentation

## 2023-09-22 MED ORDER — FLOTUFOLASTAT F 18 GALLIUM 296-5846 MBQ/ML IV SOLN
8.0000 | Freq: Once | INTRAVENOUS | Status: AC
  Administered 2023-09-22: 8.357 via INTRAVENOUS

## 2023-10-06 ENCOUNTER — Telehealth: Payer: Self-pay | Admitting: *Deleted

## 2023-10-06 NOTE — Telephone Encounter (Signed)
 Received call from pt requesting a clinic visit to review recent CT scan results. Message sent to Johnston Police, PA-C  and our scheduler, Shanda Pope

## 2023-10-07 ENCOUNTER — Telehealth: Payer: Self-pay | Admitting: Hematology and Oncology

## 2023-10-07 NOTE — Telephone Encounter (Signed)
 Called Mr. Paradis to discuss the results of his PSMA scan. He has modest progression of disease, with some new areas of avidity in the bone. Additionally his PSA has been steadily rising over the last year. I discussed options including:  1) Oligometastatic bone disease-- may be amenable to targeted radiation therapy. Will discuss with Rad/Onc  2) if Rad/Onc is not an option would consider nuclear medicine treatment with Pluvicto vs Radium-223.   3) Cabazitaxel chemotherapy would be a viable option other treatments were not an option. Patient would prefer to avoid chemotherapy.  Will reach out to Rad/Onc to see if radiation could be considered. Patient voiced understanding of plan moving forward.  Norleen IVAR Kidney, MD Department of Hematology/Oncology [facility] at [facility] Phone: [phone] Pager: [phone] Email: [email or web address]

## 2023-10-09 ENCOUNTER — Other Ambulatory Visit: Payer: Self-pay | Admitting: Hematology and Oncology

## 2023-10-09 DIAGNOSIS — C61 Malignant neoplasm of prostate: Secondary | ICD-10-CM

## 2023-10-12 ENCOUNTER — Other Ambulatory Visit: Payer: Self-pay

## 2023-10-13 ENCOUNTER — Encounter (INDEPENDENT_AMBULATORY_CARE_PROVIDER_SITE_OTHER): Payer: Self-pay

## 2023-10-13 ENCOUNTER — Other Ambulatory Visit: Payer: Self-pay

## 2023-10-13 NOTE — Progress Notes (Addendum)
 Histology and Location of Primary Cancer: Prostate Carcinoma with biochemical recurrence  Sites of Visceral and Bony Metastatic Disease: Multiple sites:  right /left acetabulum, T11 vertebral body(left)  Location(s) of Symptomatic Metastases: Multiple sites:  right /left acetabulum, T11 vertebral body(left)  Past/Anticipated chemotherapy by medical oncology, if any:  Dr. Norleen IVAR Kidney    09/22/2023 Dr. Norleen IVAR Kidney NM PET(PSMA) Skull to Mid Thigh CLINICAL DATA:  Prostate carcinoma with biochemical recurrence.   IMPRESSION: 1. Four radiotracer avid skeletal lesions consistent with skeletal metastasis. Minimal change on the CT portion exam. 2. No evidence of local recurrence in the prostate gland. 3. No evidence of nodal metastasis or visceral metastasis.   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 Current Decadron  regimen, if applicable:   Ambulatory status? Walker? Wheelchair?:  Independent  SAFETY ISSUES: Prior radiation? No Pacemaker/ICD? No Possible current pregnancy? Male Is the patient on methotrexate? No  Current Complaints / other details:  None  30 minutes spent total, including time for meaningful use questions, reviewing medication, as well as spent in face-to-face time in nurse evaluation with the patient.

## 2023-10-14 ENCOUNTER — Other Ambulatory Visit: Payer: Self-pay | Admitting: Hematology and Oncology

## 2023-10-15 ENCOUNTER — Encounter: Payer: Self-pay | Admitting: Hematology and Oncology

## 2023-10-16 ENCOUNTER — Other Ambulatory Visit: Payer: Self-pay | Admitting: Pharmacy Technician

## 2023-10-16 ENCOUNTER — Other Ambulatory Visit: Payer: Self-pay

## 2023-10-16 DIAGNOSIS — L905 Scar conditions and fibrosis of skin: Secondary | ICD-10-CM | POA: Diagnosis not present

## 2023-10-16 DIAGNOSIS — D692 Other nonthrombocytopenic purpura: Secondary | ICD-10-CM | POA: Diagnosis not present

## 2023-10-16 DIAGNOSIS — L821 Other seborrheic keratosis: Secondary | ICD-10-CM | POA: Diagnosis not present

## 2023-10-16 DIAGNOSIS — Z85828 Personal history of other malignant neoplasm of skin: Secondary | ICD-10-CM | POA: Diagnosis not present

## 2023-10-16 DIAGNOSIS — C4442 Squamous cell carcinoma of skin of scalp and neck: Secondary | ICD-10-CM | POA: Diagnosis not present

## 2023-10-16 DIAGNOSIS — D045 Carcinoma in situ of skin of trunk: Secondary | ICD-10-CM | POA: Diagnosis not present

## 2023-10-16 DIAGNOSIS — L570 Actinic keratosis: Secondary | ICD-10-CM | POA: Diagnosis not present

## 2023-10-16 NOTE — Progress Notes (Signed)
 Specialty Pharmacy Refill Coordination Note  Dennis Patel is a 73 y.o. male contacted today regarding refills of specialty medication(s) Abiraterone  Acetate (ZYTIGA )   Patient requested Delivery Delivery date: 10/23/23 Verified address: [address] [name] [address]   Medication will be filled on 10/20/23.   Patient answered questionnaire

## 2023-10-20 ENCOUNTER — Encounter: Payer: Self-pay | Admitting: Hematology and Oncology

## 2023-10-20 ENCOUNTER — Other Ambulatory Visit: Payer: Self-pay

## 2023-10-22 NOTE — Progress Notes (Signed)
 Radiation Oncology         (336) [phone] ________________________________  Outpatient Consultation  Name: VIKRANT PRYCE MRN: [id number]  Date of Service: 10/23/2023 DOB: [birth year]  RR:Dejmxd, Milo, DO  Federico Norleen ONEIDA MADISON, MD   REFERRING PHYSICIAN: Federico Norleen ONEIDA MADISON, MD  DIAGNOSIS: 73 y.o. 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 Malignant neoplasm of prostate metastatic to bone (HCC)  C61    C79.51     2. Malignant neoplasm of prostate (HCC)  C61       HISTORY OF PRESENT ILLNESS: LESEAN WOOLVERTON is a 73 y.o. male seen at the request of Dr. Federico. He has a history of DeNovo metastatic prostate cancer with widespread bone metastases. In summary, he was initially diagnosed with Gleason 4+4 prostate cancer in 11/2017 with a PSA of 48.3 and prostate volume of 133.89 cc. He underwent staging CT A/P and bone scan on 12/22/17, which revealed widespread osseous metastatic disease. He was evaluated in our multidisciplinary prostate cancer clinic on 12/29/17. At that time, the recommendation was to begin systemic treatment. We also discussed the role of palliative radiotherapy in the management of painful osseous metastatic disease but he was not having any pain at that time and therefore elected to reserve radiation for if/when needed in the future. He was started on ADT with Firmagon  under the care of Dr. Nieves in 12/2017 and was treated with 5 cycles of docetaxel  under the care of Dr. Amadeo from 01/31/18 through 04/26/18. He was transitioned to Zytiga  in 05/2018 along with Lupron  ADT.  He has continued on Lupron  and Zytiga  and his PSA remained very well controlled through 02/2023, but began rising at 0.2 in 11/2022 to 0.4 in 02/2023, 0.7 in 06/2023, and most recently at 1.1 in 08/2023. This prompted a restaging PSMA PET scan on 09/22/23 showing four radiotracer-avid skeletal lesions in the left and right acetabulum and in the transverse  process of T11, that appeared relatively stable as compared to his restaging CT A/P from 2022. There was no evidence of local recurrence, nodal metastasis, or visceral metastasis.  He has been kindly referred back to us  today to discuss the potential role for metastasis directed therapy in the management of his now limited osseous metastatic disease.  PREVIOUS RADIATION THERAPY: No  PAST MEDICAL HISTORY:  Past Medical History:  Diagnosis Date   Arthritis    right pinky finger   Atherosclerosis    BPH (benign prostatic hyperplasia)    Cancer (HCC)    Claudication    Elevated PSA    Family history of adverse reaction to anesthesia    ' My Brother had an allergic reaction    History of kidney stones    Hypertension    Peripheral arterial disease    Peripheral artery disease    Peripheral vascular disease    Pre-diabetes    prostate ca with bone mets dx'd 11/2017   Pure hypercholesterolemia    Skin cancer    Multiple      PAST SURGICAL HISTORY: Past Surgical History:  Procedure Laterality Date   ABDOMINAL ANGIOGRAM N/A 03/17/2011   Procedure: ABDOMINAL ANGIOGRAM;  Surgeon: Candyce GORMAN Reek, MD;  Location: [facility] CATH LAB;  Service: Cardiovascular;  Laterality: N/A;   ABDOMINAL AORTOGRAM W/LOWER EXTREMITY N/A 03/17/2017   Procedure: ABDOMINAL AORTOGRAM W/LOWER EXTREMITY;  Surgeon: Eliza Lonni GORMAN, MD;  Location: [facility] INVASIVE CV LAB;  Service: Cardiovascular;  Laterality: N/A;  ABDOMINAL AORTOGRAM W/LOWER EXTREMITY N/A 09/08/2017   Procedure: ABDOMINAL AORTOGRAM W/LOWER EXTREMITY;  Surgeon: Eliza Lonni RAMAN, MD;  Location: [facility] INVASIVE CV LAB;  Service: Cardiovascular;  Laterality: N/A;   ABDOMINAL AORTOGRAM W/LOWER EXTREMITY N/A 02/13/2023   Procedure: ABDOMINAL AORTOGRAM W/LOWER EXTREMITY;  Surgeon: Pearline Norman RAMAN, MD;  Location: [facility] INVASIVE CV LAB;  Service: Cardiovascular;  Laterality: N/A;   APPLICATION OF WOUND VAC Right 03/16/2023   Procedure: APPLICATION OF  WOUND VAC - PREVENA;  Surgeon: Pearline Norman RAMAN, MD;  Location: [facility] OR;  Service: Vascular;  Laterality: Right;   COLONOSCOPY  2022   ENDARTERECTOMY FEMORAL Right 03/16/2023   Procedure: ENDARTERECTOMY RIGHT COMMON FEMORAL;  Surgeon: Pearline Norman RAMAN, MD;  Location: [facility] OR;  Service: Vascular;  Laterality: Right;   ILIAC ARTERY STENT     INSERTION OF ILIAC STENT Right 03/16/2023   Procedure: INSERTION OF RIGHT COMMON ILIAC ARTERY AND RIGHT COMMON FEMORAL ARTERY STENT;  Surgeon: Pearline Norman RAMAN, MD;  Location: [facility] OR;  Service: Vascular;  Laterality: Right;   IR FLUORO GUIDE CV LINE RIGHT  12/07/2021   IR IMAGING GUIDED PORT INSERTION  01/30/2018   IR REMOVAL TUN ACCESS W/ PORT W/O FL MOD SED  12/07/2021   LOWER EXTREMITY ANGIOGRAM N/A 03/17/2011   Procedure: LOWER EXTREMITY ANGIOGRAM;  Surgeon: Candyce RAMAN Reek, MD;  Location: [facility] CATH LAB;  Service: Cardiovascular;  Laterality: N/A;   PATCH ANGIOPLASTY Right 03/16/2023   Procedure: PATCH ANGIOPLASTY RIGHT FEMORAL ARTERY USING XENOSURE BIOLOGIC PATCH;  Surgeon: Pearline Norman RAMAN, MD;  Location: [facility] OR;  Service: Vascular;  Laterality: Right;   PERIPHERAL VASCULAR INTERVENTION Left 09/08/2017   Procedure: PERIPHERAL VASCULAR INTERVENTION;  Surgeon: Eliza Lonni RAMAN, MD;  Location: [facility] INVASIVE CV LAB;  Service: Cardiovascular;  Laterality: Left;  Common iliac    FAMILY HISTORY:  Family History  Problem Relation Age of Onset   Ovarian cancer Mother 69   Diabetes Mother    CAD Father    Diabetes Sister    Lymphoma Brother 16   Skin cancer Brother        basal and squamous cell   Ovarian cancer Maternal Grandmother    CAD Son    Diabetes Son    Heart disease Son    Ovarian cancer Maternal Great-grandmother     SOCIAL HISTORY:  Social History   Socioeconomic History   Marital status: Married    Spouse name: Not on file   Number of children: Not on file   Years of education: Not on file   Highest education level: Not on file   Occupational History   Not on file  Tobacco Use   Smoking status: Every Day    Types: Cigars   Smokeless tobacco: Never   Tobacco comments:    4-5 cigars per day. Last cigar 2/23 and is quitting cigars  Vaping Use   Vaping status: Never Used  Substance and Sexual Activity   Alcohol use: Never    Comment: 12/09/1993 quit drinking   Drug use: Never   Sexual activity: Yes  Other Topics Concern   Not on file  Social History Narrative   ** Merged History Encounter **       ** Merged History Encounter **       Social Drivers of Corporate investment banker Strain: Not on file  Food Insecurity: No Food Insecurity (10/23/2023)   Hunger Vital Sign    Worried About Running Out of Food in the Last Year:  Never true    Ran Out of Food in the Last Year: Never true  Transportation Needs: No Transportation Needs (10/23/2023)   PRAPARE - Administrator, Civil Service (Medical): No    Lack of Transportation (Non-Medical): No  Physical Activity: Not on file  Stress: Not on file  Social Connections: Moderately Integrated (03/16/2023)   Social Connection and Isolation Panel    Frequency of Communication with Friends and Family: More than three times a week    Frequency of Social Gatherings with Friends and Family: More than three times a week    Attends Religious Services: More than 4 times per year    Active Member of [employer] or Organizations: Yes    Attends Banker Meetings: More than 4 times per year    Marital Status: Widowed  Intimate Partner Violence: Not At Risk (10/23/2023)   Humiliation, Afraid, Rape, and Kick questionnaire    Fear of Current or Ex-Partner: No    Emotionally Abused: No    Physically Abused: No    Sexually Abused: No    ALLERGIES: Patient has no known allergies.  MEDICATIONS:  Current Outpatient Medications  Medication Sig Dispense Refill   abiraterone  acetate (ZYTIGA ) 250 MG tablet Take 4 tablets (1,000 mg total) by mouth daily. Take on  an empty stomach 1 hour before or 2 hours after a meal 120 tablet 9   acetaminophen  (TYLENOL ) 500 MG tablet Take 1,000 mg by mouth at bedtime.     amLODipine  (NORVASC ) 10 MG tablet Take 1 tablet (10 mg total) by mouth daily. 30 tablet 0   aspirin  EC 81 MG tablet Take 81 mg by mouth daily. Swallow whole.     atorvastatin  (LIPITOR) 10 MG tablet TAKE 1 TABLET BY MOUTH EVERY DAY 90 tablet 3   calcium -vitamin D (OSCAL WITH D) 250-125 MG-UNIT tablet Take 1 tablet by mouth in the morning.     clopidogrel  (PLAVIX ) 75 MG tablet Take 1 tablet (75 mg total) by mouth daily. 90 tablet 1   famotidine  (PEPCID ) 10 MG tablet Take 10 mg by mouth in the morning.     gabapentin  (NEURONTIN ) 300 MG capsule Take 1 capsule (300 mg total) by mouth 2 (two) times daily. 180 capsule 1   hydrochlorothiazide  (HYDRODIURIL ) 12.5 MG tablet Take 12.5 mg by mouth every morning.     Multiple Vitamins-Minerals (ONE DAILY MULTIVITAMIN MEN PO) Take 1 tablet by mouth in the morning.     naproxen sodium (ALEVE) 220 MG tablet Take 220 mg by mouth in the morning.     predniSONE  (DELTASONE ) 5 MG tablet TAKE 1 TABLET BY MOUTH EVERY DAY WITH BREAKFAST 90 tablet 3   traZODone  (DESYREL ) 50 MG tablet TAKE 1 TABLET (50 MG TOTAL) BY MOUTH AT BEDTIME AS NEEDED. FOR SLEEP 90 tablet 1   No current facility-administered medications for this encounter.    REVIEW OF SYSTEMS:  On review of systems, the patient reports that he is doing well overall. He denies any chest pain, shortness of breath, cough, fevers, chills, night sweats, unintended weight changes. He denies any bowel or bladder disturbances, and denies abdominal pain, nausea or vomiting. He denies any new musculoskeletal or joint aches or pains. A complete review of systems is obtained and is otherwise negative.    PHYSICAL EXAM:  Wt Readings from Last 3 Encounters:  10/23/23 (P) 190 lb 4 oz (86.3 kg)  09/13/23 188 lb 8 oz (85.5 kg)  08/11/23 188 lb 11.2 oz (85.6 kg)  Temp Readings  from Last 3 Encounters:  10/23/23 (!) (P) 96.9 F (36.1 C) ((P) Temporal)  09/13/23 97.7 F (36.5 C) (Temporal)  08/11/23 98 F (36.7 C) (Temporal)   BP Readings from Last 3 Encounters:  10/23/23 (!) (P) 156/63  09/13/23 (!) 141/71  08/11/23 (!) 146/75   Pulse Readings from Last 3 Encounters:  10/23/23 (!) (P) 57  09/13/23 (!) 56  08/11/23 (!) 56    /10  In general this is a well appearing Caucasian man in no acute distress. He's alert and oriented x4 and appropriate throughout the examination. Cardiopulmonary assessment is negative for acute distress and he exhibits normal effort.     KPS = 100  100 - Normal; no complaints; no evidence of disease. 90   - Able to carry on normal activity; minor signs or symptoms of disease. 80   - Normal activity with effort; some signs or symptoms of disease. 58   - Cares for self; unable to carry on normal activity or to do active work. 60   - Requires occasional assistance, but is able to care for most of his personal needs. 50   - Requires considerable assistance and frequent medical care. 40   - Disabled; requires special care and assistance. 30   - Severely disabled; hospital admission is indicated although death not imminent. 20   - Very sick; hospital admission necessary; active supportive treatment necessary. 10   - Moribund; fatal processes progressing rapidly. 0     - Dead  Karnofsky DA, Abelmann WH, Craver LS and Burchenal [facility] [phone]) The use of the nitrogen mustards in the palliative treatment of carcinoma: with particular reference to bronchogenic carcinoma Cancer 1 634-56  LABORATORY DATA:  Lab Results  Component Value Date   WBC 10.2 09/13/2023   HGB 16.2 09/13/2023   HCT 45.5 09/13/2023   MCV 89.6 09/13/2023   PLT 219 09/13/2023   Lab Results  Component Value Date   NA 141 09/13/2023   K 3.8 09/13/2023   CL 105 09/13/2023   CO2 28 09/13/2023   Lab Results  Component Value Date   ALT 22 09/13/2023   AST 19  09/13/2023   ALKPHOS 86 09/13/2023   BILITOT 0.7 09/13/2023     RADIOGRAPHY: No results found.    IMPRESSION/PLAN: 1. 73 y.o. 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 We discussed the natural history of metastatic prostate cancer and general treatment, highlighting the role of stereotactic body radiotherapy (SBRT) in the management of oligometastatic disease. We discussed the available radiation techniques, and focused on the details and logistics of delivery.  The recommendation is for a 5 fraction course of SBRT to the 3 pelvic lesions and lesion in the transverse process of T11.  We reviewed the anticipated acute and late sequelae associated with radiation in this setting. The patient was encouraged to ask questions that were answered to his stated satisfaction.  At the end of our discussion, the patient would like to proceed with the recommended 5 fraction course of SBRT and has freely signed written consent to proceed today in the office.  A copy of this document will be placed in his medical record and we will share our discussion with Dr. Federico.  He is tentatively scheduled for CT simulation at 8 AM on Wednesday, 10/31/2023, in anticipation of beginning his treatments in the near future.  We enjoyed meeting with him again today and look forward to continuing to participate in his care.  We personally spent 70 minutes in this encounter including chart review, reviewing radiological studies, meeting face-to-face with the patient, entering orders, coordinating care and completing documentation.    Sabra MICAEL Rusk, PA-C    Donnice Barge, MD  [facility]  Radiation Oncology Direct Dial: [phone]  Fax: [phone] [email or web address]  Skype  [email or web address]   This document serves as a record of services personally performed by Donnice Barge, MD and Sabra Rusk, PA-C. It was created on their behalf by Izetta Neither, a trained medical scribe. The creation of this record is based on the scribe's personal observations and the provider's statements to them. This document has been checked and approved by the attending provider.

## 2023-10-23 ENCOUNTER — Ambulatory Visit
Admission: RE | Admit: 2023-10-23 | Discharge: 2023-10-23 | Disposition: A | Discharge status: Home / Self Care | Source: Ambulatory Visit | Attending: Radiation Oncology | Admitting: Radiation Oncology

## 2023-10-23 ENCOUNTER — Encounter: Payer: Self-pay | Admitting: Radiation Oncology

## 2023-10-23 DIAGNOSIS — Z79899 Other long term (current) drug therapy: Secondary | ICD-10-CM | POA: Insufficient documentation

## 2023-10-23 DIAGNOSIS — I700 Atherosclerosis of aorta: Secondary | ICD-10-CM | POA: Insufficient documentation

## 2023-10-23 DIAGNOSIS — C7951 Secondary malignant neoplasm of bone: Secondary | ICD-10-CM | POA: Insufficient documentation

## 2023-10-23 DIAGNOSIS — C61 Malignant neoplasm of prostate: Secondary | ICD-10-CM | POA: Insufficient documentation

## 2023-10-23 DIAGNOSIS — I10 Essential (primary) hypertension: Secondary | ICD-10-CM | POA: Diagnosis not present

## 2023-10-23 DIAGNOSIS — I739 Peripheral vascular disease, unspecified: Secondary | ICD-10-CM | POA: Insufficient documentation

## 2023-10-23 DIAGNOSIS — E7800 Pure hypercholesterolemia, unspecified: Secondary | ICD-10-CM | POA: Diagnosis not present

## 2023-10-23 DIAGNOSIS — F17210 Nicotine dependence, cigarettes, uncomplicated: Secondary | ICD-10-CM | POA: Insufficient documentation

## 2023-10-23 DIAGNOSIS — Z7982 Long term (current) use of aspirin: Secondary | ICD-10-CM | POA: Insufficient documentation

## 2023-10-23 DIAGNOSIS — Z85828 Personal history of other malignant neoplasm of skin: Secondary | ICD-10-CM | POA: Diagnosis not present

## 2023-10-23 DIAGNOSIS — M129 Arthropathy, unspecified: Secondary | ICD-10-CM | POA: Diagnosis not present

## 2023-10-23 DIAGNOSIS — Z87442 Personal history of urinary calculi: Secondary | ICD-10-CM | POA: Insufficient documentation

## 2023-10-23 DIAGNOSIS — Z8041 Family history of malignant neoplasm of ovary: Secondary | ICD-10-CM | POA: Diagnosis not present

## 2023-10-23 DIAGNOSIS — Z7952 Long term (current) use of systemic steroids: Secondary | ICD-10-CM | POA: Diagnosis not present

## 2023-10-23 DIAGNOSIS — Z808 Family history of malignant neoplasm of other organs or systems: Secondary | ICD-10-CM | POA: Diagnosis not present

## 2023-10-25 DIAGNOSIS — C61 Malignant neoplasm of prostate: Secondary | ICD-10-CM | POA: Diagnosis not present

## 2023-10-25 DIAGNOSIS — C7951 Secondary malignant neoplasm of bone: Secondary | ICD-10-CM | POA: Diagnosis not present

## 2023-10-31 ENCOUNTER — Ambulatory Visit
Admission: RE | Admit: 2023-10-31 | Discharge: 2023-10-31 | Disposition: A | Discharge status: Home / Self Care | Source: Ambulatory Visit | Attending: Radiation Oncology | Admitting: Radiation Oncology

## 2023-10-31 DIAGNOSIS — C7951 Secondary malignant neoplasm of bone: Secondary | ICD-10-CM | POA: Insufficient documentation

## 2023-10-31 DIAGNOSIS — Z510 Encounter for antineoplastic radiation therapy: Secondary | ICD-10-CM | POA: Insufficient documentation

## 2023-10-31 DIAGNOSIS — C61 Malignant neoplasm of prostate: Secondary | ICD-10-CM | POA: Diagnosis not present

## 2023-10-31 NOTE — Progress Notes (Signed)
  Radiation Oncology         (336) [phone] ________________________________  Name: Dennis Patel MRN: [id number]  Date: 10/31/2023  DOB: [birth year]  STEREOTACTIC BODY RADIOTHERAPY SIMULATION AND TREATMENT PLANNING NOTE    ICD-10-CM   1. Malignant neoplasm of prostate metastatic to bone (HCC)  C61    C79.51      DIAGNOSIS:  73 y.o. 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  Identity was confirmed.  All relevant records and images related to the planned course of therapy were reviewed.  The patient freely provided informed written consent to proceed with treatment after reviewing the details related to the planned course of therapy. The consent form was witnessed and verified by the simulation staff.  Then, the patient was set-up in a stable reproducible  supine position for radiation therapy.  A BodyFix immobilization pillow was fabricated for reproducible positioning.  Surface markings were placed.  The CT images were loaded into the planning software.  The gross target volumes (GTV) and planning target volumes (PTV) were delinieated, and avoidance structures were contoured.  Treatment planning then occurred.  The radiation prescription was entered and confirmed.  A total of two complex treatment devices were fabricated in the form of the BodyFix immobilization pillow and a neck accuform cushion.  I have requested : 3D Simulation  I have requested a DVH of the following structures: targets and all normal structures near the target including lungs, heart, spinal cord and others as noted on the radiation plan to maintain doses in adherence with established limits  SPECIAL TREATMENT PROCEDURE:  The planned course of therapy using radiation constitutes a special treatment procedure. Special care is required in the management of this patient for the following reasons. High dose per  fraction requiring special monitoring for increased toxicities of treatment including daily imaging..  The special nature of the planned course of radiotherapy will require increased physician supervision and oversight to ensure patient's safety with optimal treatment outcomes.    This requires extended time and effort.    PLAN:  The patient will receive 40 Gy in 5 fractions to a rib and 3 pelvic bone mets.  ________________________________  Dennis Patel, M.D.

## 2023-11-09 NOTE — Progress Notes (Unsigned)
 Patient ID: Dennis Patel, male   DOB: Aug 18, 1950, 73 y.o.   MRN: [id number]  Reason for Consult: No chief complaint on file.   Referred by Auston Opal, DO  Subjective:     HPI Dennis Patel is a 73 y.o. male presenting for follow-up. On 03/16/2023 he underwent right iliofemoral, profunda and SFA endarterectomy, stenting of right EIA and CIA for rest pain.  He reports his rest pain has resolved.  He is able to walk a bit farther although still does have some discomfort with ambulation.  He has a known long segment SFA an popliteal occlusion on the right. Today he reports he has stable claudication.  He can ambulate about 100 to 200 yards.  He denies rest pain or nonhealing wounds.  He continues to be compliant with aspirin , Plavix  and statin  Past Medical History:  Diagnosis Date   Arthritis    right pinky finger   Atherosclerosis    BPH (benign prostatic hyperplasia)    Cancer (HCC)    Claudication    Elevated PSA    Family history of adverse reaction to anesthesia    ' My Brother had an allergic reaction    History of kidney stones    Hypertension    Peripheral arterial disease    Peripheral artery disease    Peripheral vascular disease    Pre-diabetes    prostate ca with bone mets dx'd 11/2017   Pure hypercholesterolemia    Skin cancer    Multiple   Family History  Problem Relation Age of Onset   Ovarian cancer Mother 60   Diabetes Mother    CAD Father    Diabetes Sister    Lymphoma Brother 55   Skin cancer Brother        basal and squamous cell   Ovarian cancer Maternal Grandmother    CAD Son    Diabetes Son    Heart disease Son    Ovarian cancer Maternal Great-grandmother    Past Surgical History:  Procedure Laterality Date   ABDOMINAL ANGIOGRAM N/A 03/17/2011   Procedure: ABDOMINAL ANGIOGRAM;  Surgeon: Candyce GORMAN Reek, MD;  Location: [facility] CATH LAB;  Service: Cardiovascular;  Laterality: N/A;   ABDOMINAL AORTOGRAM W/LOWER EXTREMITY N/A 03/17/2017    Procedure: ABDOMINAL AORTOGRAM W/LOWER EXTREMITY;  Surgeon: Eliza Lonni GORMAN, MD;  Location: [facility] INVASIVE CV LAB;  Service: Cardiovascular;  Laterality: N/A;   ABDOMINAL AORTOGRAM W/LOWER EXTREMITY N/A 09/08/2017   Procedure: ABDOMINAL AORTOGRAM W/LOWER EXTREMITY;  Surgeon: Eliza Lonni GORMAN, MD;  Location: [facility] INVASIVE CV LAB;  Service: Cardiovascular;  Laterality: N/A;   ABDOMINAL AORTOGRAM W/LOWER EXTREMITY N/A 02/13/2023   Procedure: ABDOMINAL AORTOGRAM W/LOWER EXTREMITY;  Surgeon: Pearline Norman GORMAN, MD;  Location: [facility] INVASIVE CV LAB;  Service: Cardiovascular;  Laterality: N/A;   APPLICATION OF WOUND VAC Right 03/16/2023   Procedure: APPLICATION OF WOUND VAC - PREVENA;  Surgeon: Pearline Norman GORMAN, MD;  Location: [facility] OR;  Service: Vascular;  Laterality: Right;   COLONOSCOPY  2022   ENDARTERECTOMY FEMORAL Right 03/16/2023   Procedure: ENDARTERECTOMY RIGHT COMMON FEMORAL;  Surgeon: Pearline Norman GORMAN, MD;  Location: [facility] OR;  Service: Vascular;  Laterality: Right;   ILIAC ARTERY STENT     INSERTION OF ILIAC STENT Right 03/16/2023   Procedure: INSERTION OF RIGHT COMMON ILIAC ARTERY AND RIGHT COMMON FEMORAL ARTERY STENT;  Surgeon: Pearline Norman GORMAN, MD;  Location: [facility] OR;  Service: Vascular;  Laterality: Right;   IR FLUORO GUIDE CV LINE RIGHT  12/07/2021   IR IMAGING GUIDED PORT INSERTION  01/30/2018   IR REMOVAL TUN ACCESS W/ PORT W/O FL MOD SED  12/07/2021   LOWER EXTREMITY ANGIOGRAM N/A 03/17/2011   Procedure: LOWER EXTREMITY ANGIOGRAM;  Surgeon: Candyce GORMAN Reek, MD;  Location: [facility] CATH LAB;  Service: Cardiovascular;  Laterality: N/A;   PATCH ANGIOPLASTY Right 03/16/2023   Procedure: PATCH ANGIOPLASTY RIGHT FEMORAL ARTERY USING XENOSURE BIOLOGIC PATCH;  Surgeon: Pearline Norman GORMAN, MD;  Location: [facility] OR;  Service: Vascular;  Laterality: Right;   PERIPHERAL VASCULAR INTERVENTION Left 09/08/2017   Procedure: PERIPHERAL VASCULAR INTERVENTION;  Surgeon: Eliza Lonni GORMAN, MD;  Location: [facility]  INVASIVE CV LAB;  Service: Cardiovascular;  Laterality: Left;  Common iliac    Short Social History:  Social History   Tobacco Use   Smoking status: Every Day    Types: Cigars   Smokeless tobacco: Never   Tobacco comments:    4-5 cigars per day. Last cigar 2/23 and is quitting cigars  Substance Use Topics   Alcohol use: Never    Comment: 12/09/1993 quit drinking    No Known Allergies  Current Outpatient Medications  Medication Sig Dispense Refill   abiraterone  acetate (ZYTIGA ) 250 MG tablet Take 4 tablets (1,000 mg total) by mouth daily. Take on an empty stomach 1 hour before or 2 hours after a meal 120 tablet 9   acetaminophen  (TYLENOL ) 500 MG tablet Take 1,000 mg by mouth at bedtime.     amLODipine  (NORVASC ) 10 MG tablet Take 1 tablet (10 mg total) by mouth daily. 30 tablet 0   aspirin  EC 81 MG tablet Take 81 mg by mouth daily. Swallow whole.     atorvastatin  (LIPITOR) 10 MG tablet TAKE 1 TABLET BY MOUTH EVERY DAY 90 tablet 3   calcium -vitamin D (OSCAL WITH D) 250-125 MG-UNIT tablet Take 1 tablet by mouth in the morning.     clopidogrel  (PLAVIX ) 75 MG tablet Take 1 tablet (75 mg total) by mouth daily. 90 tablet 1   famotidine  (PEPCID ) 10 MG tablet Take 10 mg by mouth in the morning.     gabapentin  (NEURONTIN ) 300 MG capsule Take 1 capsule (300 mg total) by mouth 2 (two) times daily. 180 capsule 1   hydrochlorothiazide  (HYDRODIURIL ) 12.5 MG tablet Take 12.5 mg by mouth every morning.     Multiple Vitamins-Minerals (ONE DAILY MULTIVITAMIN MEN PO) Take 1 tablet by mouth in the morning.     naproxen sodium (ALEVE) 220 MG tablet Take 220 mg by mouth in the morning.     predniSONE  (DELTASONE ) 5 MG tablet TAKE 1 TABLET BY MOUTH EVERY DAY WITH BREAKFAST 90 tablet 3   traZODone  (DESYREL ) 50 MG tablet TAKE 1 TABLET (50 MG TOTAL) BY MOUTH AT BEDTIME AS NEEDED. FOR SLEEP 90 tablet 1   No current facility-administered medications for this visit.    REVIEW OF SYSTEMS  All other systems  were reviewed and are negative     Objective:  Objective   There were no vitals filed for this visit.  There is no height or weight on file to calculate BMI.  Physical Exam General: no acute distress Cardiac: hemodynamically stable Pulm: normal work of breathing Neuro: alert, no focal deficit Extremities: no edema, cyanosis or wounds  Data: ABI +---------+------------------+-----+----------+--------+  Right   Rt Pressure (mmHg)IndexWaveform  Comment   +---------+------------------+-----+----------+--------+  Brachial 146                                        +---------+------------------+-----+----------+--------+  PTA     71                0.48 monophasic          +---------+------------------+-----+----------+--------+  DP      75                0.50 monophasic          +---------+------------------+-----+----------+--------+  Great Toe17                0.11 Abnormal            +---------+------------------+-----+----------+--------+   +---------+------------------+-----+----------+-------+  Left    Lt Pressure (mmHg)IndexWaveform  Comment  +---------+------------------+-----+----------+-------+  Brachial 149                                       +---------+------------------+-----+----------+-------+  PTA     95                0.64 monophasic         +---------+------------------+-----+----------+-------+  DP      74                0.50 monophasic         +---------+------------------+-----+----------+-------+  Great Toe0                 0.00 Absent             +---------+------------------+-----+----------+-------+   Previously: Right 0.53 toe pressure 54, left 0.61 toe pressure 59  Aortoiliac duplex Right Stent(s):  +-------------------+--------+---------------+-----------+--------+  Common Iliac ArteryPSV cm/sStenosis       Waveform   Comments   +-------------------+--------+---------------+-----------+--------+  Prox to Stent      33                     biphasic   dampened  +-------------------+--------+---------------+-----------+--------+  Proximal Stent     111                    multiphasic          +-------------------+--------+---------------+-----------+--------+  Mid Stent          304     50-99% stenosismultiphasicVR 2.7    +-------------------+--------+---------------+-----------+--------+  Distal Stent       106                    monophasic           +-------------------+--------+---------------+-----------+--------+  Distal to Stent    89                     monophasic           +-------------------+--------+---------------+-----------+--------+    +---------------------+--------+--------+-----------+--------+  External Iliac ArteryPSV cm/sStenosisWaveform   Comments  +---------------------+--------+--------+-----------+--------+  Prox to Stent        89              monophasic           +---------------------+--------+--------+-----------+--------+  Proximal Stent       93              monophasic           +---------------------+--------+--------+-----------+--------+  Mid Stent            143             multiphasic          +---------------------+--------+--------+-----------+--------+  Distal Stent         153             biphasic             +---------------------+--------+--------+-----------+--------+  Distal to Stent      162             monophasic           +---------------------+--------+--------+-----------+--------+    Left Stent(s):  +-------------------+--------+---------------+----------+--------+  Common Iliac ArteryPSV cm/sStenosis       Waveform  Comments  +-------------------+--------+---------------+----------+--------+  Prox to Stent      40                     monophasic           +-------------------+--------+---------------+----------+--------+  Proximal Stent     78                     monophasic          +-------------------+--------+---------------+----------+--------+  Mid Stent          69                     monophasic          +-------------------+--------+---------------+----------+--------+  Distal Stent       206     50-99% stenosismonophasicVR 3.0    +-------------------+--------+---------------+----------+--------+  Distal to Stent    179                    monophasic          +-------------------+--------+---------------+----------+--------+       Assessment/Plan:   Dennis Patel is a 73 y.o. male with CLTI and rest pain of the RLE. On 03/16/23 he underwent R iliofemoral, SFA and profunda endarterectomy with external and common iliac artery stenting.  Noninvasive studies demonstrated stable ABI with patent right and left iliac stents.   He does have a severe calcific stenosis versus occlusion in his left common femoral and an occlusion of his left SFA and has a known long segment SFA an popliteal occlusion on the right.  He has stable claudication and denies symptoms of CLTI. Continue DAPT Plan for follow-up in 3-4 months with a repeat ABI and duplex   Norman GORMAN Serve MD [facility]

## 2023-11-10 ENCOUNTER — Ambulatory Visit (HOSPITAL_BASED_OUTPATIENT_CLINIC_OR_DEPARTMENT_OTHER)
Admission: RE | Admit: 2023-11-10 | Discharge: 2023-11-10 | Disposition: A | Discharge status: Home / Self Care | Source: Ambulatory Visit | Attending: Vascular Surgery | Admitting: Vascular Surgery

## 2023-11-10 ENCOUNTER — Other Ambulatory Visit (HOSPITAL_COMMUNITY): Payer: Self-pay

## 2023-11-10 ENCOUNTER — Ambulatory Visit: Admitting: Vascular Surgery

## 2023-11-10 ENCOUNTER — Encounter: Payer: Self-pay | Admitting: Vascular Surgery

## 2023-11-10 ENCOUNTER — Ambulatory Visit (HOSPITAL_COMMUNITY)
Admission: RE | Admit: 2023-11-10 | Discharge: 2023-11-10 | Disposition: A | Discharge status: Home / Self Care | Source: Ambulatory Visit | Attending: Vascular Surgery | Admitting: Vascular Surgery

## 2023-11-10 VITALS — BP 162/72 | HR 53 | Temp 98.0°F | Ht 68.0 in | Wt 189.0 lb

## 2023-11-10 DIAGNOSIS — I739 Peripheral vascular disease, unspecified: Secondary | ICD-10-CM | POA: Diagnosis not present

## 2023-11-10 DIAGNOSIS — I70219 Atherosclerosis of native arteries of extremities with intermittent claudication, unspecified extremity: Secondary | ICD-10-CM | POA: Diagnosis not present

## 2023-11-10 LAB — VAS US ABI WITH/WO TBI
Left ABI: 0.64
Right ABI: 0.5

## 2023-11-13 ENCOUNTER — Other Ambulatory Visit: Payer: Self-pay

## 2023-11-13 DIAGNOSIS — C7951 Secondary malignant neoplasm of bone: Secondary | ICD-10-CM | POA: Diagnosis not present

## 2023-11-13 DIAGNOSIS — I70219 Atherosclerosis of native arteries of extremities with intermittent claudication, unspecified extremity: Secondary | ICD-10-CM

## 2023-11-14 ENCOUNTER — Other Ambulatory Visit: Payer: Self-pay

## 2023-11-14 ENCOUNTER — Ambulatory Visit
Admission: RE | Admit: 2023-11-14 | Discharge: 2023-11-14 | Disposition: A | Source: Ambulatory Visit | Attending: Radiation Oncology

## 2023-11-14 DIAGNOSIS — C7951 Secondary malignant neoplasm of bone: Secondary | ICD-10-CM | POA: Diagnosis not present

## 2023-11-14 LAB — RAD ONC ARIA SESSION SUMMARY
Course Elapsed Days: 0
Plan Fractions Treated to Date: 1
Plan Fractions Treated to Date: 1
Plan Prescribed Dose Per Fraction: 8 Gy
Plan Prescribed Dose Per Fraction: 8 Gy
Plan Total Fractions Prescribed: 5
Plan Total Fractions Prescribed: 5
Plan Total Prescribed Dose: 40 Gy
Plan Total Prescribed Dose: 40 Gy
Reference Point Dosage Given to Date: 8 Gy
Reference Point Dosage Given to Date: 8 Gy
Reference Point Session Dosage Given: 8 Gy
Reference Point Session Dosage Given: 8 Gy
Session Number: 1

## 2023-11-15 ENCOUNTER — Ambulatory Visit

## 2023-11-16 ENCOUNTER — Ambulatory Visit
Admission: RE | Admit: 2023-11-16 | Discharge: 2023-11-16 | Disposition: A | Discharge status: Home / Self Care | Source: Ambulatory Visit | Attending: Radiation Oncology | Admitting: Radiation Oncology

## 2023-11-16 ENCOUNTER — Other Ambulatory Visit: Payer: Self-pay

## 2023-11-16 DIAGNOSIS — C7951 Secondary malignant neoplasm of bone: Secondary | ICD-10-CM | POA: Diagnosis not present

## 2023-11-16 LAB — RAD ONC ARIA SESSION SUMMARY
Course Elapsed Days: 2
Plan Fractions Treated to Date: 2
Plan Fractions Treated to Date: 2
Plan Prescribed Dose Per Fraction: 8 Gy
Plan Prescribed Dose Per Fraction: 8 Gy
Plan Total Fractions Prescribed: 5
Plan Total Fractions Prescribed: 5
Plan Total Prescribed Dose: 40 Gy
Plan Total Prescribed Dose: 40 Gy
Reference Point Dosage Given to Date: 16 Gy
Reference Point Dosage Given to Date: 16 Gy
Reference Point Session Dosage Given: 8 Gy
Reference Point Session Dosage Given: 8 Gy
Session Number: 2

## 2023-11-17 ENCOUNTER — Ambulatory Visit

## 2023-11-20 ENCOUNTER — Other Ambulatory Visit: Payer: Self-pay

## 2023-11-20 ENCOUNTER — Telehealth: Payer: Self-pay

## 2023-11-20 ENCOUNTER — Other Ambulatory Visit (HOSPITAL_COMMUNITY): Payer: Self-pay

## 2023-11-20 ENCOUNTER — Ambulatory Visit
Admission: RE | Admit: 2023-11-20 | Discharge: 2023-11-20 | Disposition: A | Discharge status: Home / Self Care | Source: Ambulatory Visit | Attending: Radiation Oncology | Admitting: Radiation Oncology

## 2023-11-20 DIAGNOSIS — C61 Malignant neoplasm of prostate: Secondary | ICD-10-CM | POA: Insufficient documentation

## 2023-11-20 DIAGNOSIS — C7951 Secondary malignant neoplasm of bone: Secondary | ICD-10-CM | POA: Diagnosis not present

## 2023-11-20 DIAGNOSIS — Z510 Encounter for antineoplastic radiation therapy: Secondary | ICD-10-CM | POA: Diagnosis not present

## 2023-11-20 LAB — RAD ONC ARIA SESSION SUMMARY
Course Elapsed Days: 6
Plan Fractions Treated to Date: 3
Plan Fractions Treated to Date: 3
Plan Prescribed Dose Per Fraction: 8 Gy
Plan Prescribed Dose Per Fraction: 8 Gy
Plan Total Fractions Prescribed: 5
Plan Total Fractions Prescribed: 5
Plan Total Prescribed Dose: 40 Gy
Plan Total Prescribed Dose: 40 Gy
Reference Point Dosage Given to Date: 24 Gy
Reference Point Dosage Given to Date: 24 Gy
Reference Point Session Dosage Given: 8 Gy
Reference Point Session Dosage Given: 8 Gy
Session Number: 3

## 2023-11-20 NOTE — Telephone Encounter (Signed)
 Oral Oncology Patient Advocate Encounter   Received notification that patient is due for re-enrollment for assistance for abiraterone  acetate (ZYTIGA ) 250 MG tablet through [employer].  There are no open grants or funds available at this time, I do have him on the list to get re-enrolled as soon as they open up.  Lucie Lamer, CPhT [place]  [facility] Oncology Pharmacy Patient Advocate Specialist [facility] [place] Phone: [phone]  Fax: [phone] [email or web address]

## 2023-11-22 ENCOUNTER — Ambulatory Visit
Admission: RE | Admit: 2023-11-22 | Discharge: 2023-11-22 | Disposition: A | Source: Ambulatory Visit | Attending: Radiation Oncology

## 2023-11-22 ENCOUNTER — Other Ambulatory Visit: Payer: Self-pay

## 2023-11-22 ENCOUNTER — Inpatient Hospital Stay: Payer: Medicare Other

## 2023-11-22 ENCOUNTER — Inpatient Hospital Stay: Payer: Medicare Other | Admitting: Hematology and Oncology

## 2023-11-22 DIAGNOSIS — Z510 Encounter for antineoplastic radiation therapy: Secondary | ICD-10-CM | POA: Diagnosis not present

## 2023-11-22 DIAGNOSIS — C7951 Secondary malignant neoplasm of bone: Secondary | ICD-10-CM | POA: Diagnosis not present

## 2023-11-22 DIAGNOSIS — C61 Malignant neoplasm of prostate: Secondary | ICD-10-CM | POA: Diagnosis not present

## 2023-11-22 LAB — RAD ONC ARIA SESSION SUMMARY
Course Elapsed Days: 8
Plan Fractions Treated to Date: 4
Plan Fractions Treated to Date: 4
Plan Prescribed Dose Per Fraction: 8 Gy
Plan Prescribed Dose Per Fraction: 8 Gy
Plan Total Fractions Prescribed: 5
Plan Total Fractions Prescribed: 5
Plan Total Prescribed Dose: 40 Gy
Plan Total Prescribed Dose: 40 Gy
Reference Point Dosage Given to Date: 32 Gy
Reference Point Dosage Given to Date: 32 Gy
Reference Point Session Dosage Given: 8 Gy
Reference Point Session Dosage Given: 8 Gy
Session Number: 4

## 2023-11-24 ENCOUNTER — Other Ambulatory Visit: Payer: Self-pay

## 2023-11-24 ENCOUNTER — Ambulatory Visit
Admission: RE | Admit: 2023-11-24 | Discharge: 2023-11-24 | Disposition: A | Discharge status: Home / Self Care | Source: Ambulatory Visit | Attending: Radiation Oncology | Admitting: Radiation Oncology

## 2023-11-24 ENCOUNTER — Encounter: Payer: Self-pay | Admitting: Hematology and Oncology

## 2023-11-24 DIAGNOSIS — Z510 Encounter for antineoplastic radiation therapy: Secondary | ICD-10-CM | POA: Diagnosis not present

## 2023-11-24 DIAGNOSIS — C61 Malignant neoplasm of prostate: Secondary | ICD-10-CM | POA: Diagnosis not present

## 2023-11-24 DIAGNOSIS — C7951 Secondary malignant neoplasm of bone: Secondary | ICD-10-CM | POA: Diagnosis not present

## 2023-11-24 LAB — RAD ONC ARIA SESSION SUMMARY
Course Elapsed Days: 10
Plan Fractions Treated to Date: 5
Plan Fractions Treated to Date: 5
Plan Prescribed Dose Per Fraction: 8 Gy
Plan Prescribed Dose Per Fraction: 8 Gy
Plan Total Fractions Prescribed: 5
Plan Total Fractions Prescribed: 5
Plan Total Prescribed Dose: 40 Gy
Plan Total Prescribed Dose: 40 Gy
Reference Point Dosage Given to Date: 40 Gy
Reference Point Dosage Given to Date: 40 Gy
Reference Point Session Dosage Given: 8 Gy
Reference Point Session Dosage Given: 8 Gy
Session Number: 5

## 2023-11-27 NOTE — Radiation Completion Notes (Signed)
 Patient Name: Dennis Patel, Dennis Patel MRN: [id number] Date of Birth: 1950-10-17 Referring Physician: NORLEEN KIDNEY, M.D. Date of Service: 2023-11-27 Radiation Oncologist: Adina Barge, M.D. [facility]                             RADIATION ONCOLOGY END OF TREATMENT NOTE     Diagnosis: C79.51 Secondary malignant neoplasm of bone Staging on 2020-08-25: Malignant neoplasm of prostate (HCC) T=cTX, N=cNX, M=pM1b Intent: Curative     ==========DELIVERED PLANS==========  First Treatment Date: 2023-11-14 Last Treatment Date: 2023-11-24   Plan Name: [id number] Site: Thoracic Spine Technique: SBRT/SRT-IMRT Mode: Photon Dose Per Fraction: 8 Gy Prescribed Dose (Delivered / Prescribed): 40 Gy / 40 Gy Prescribed Fxs (Delivered / Prescribed): 5 / 5   Plan Name: Pelvis_SBRT Site: Pelvis Technique: SBRT/SRT-IMRT Mode: Photon Dose Per Fraction: 8 Gy Prescribed Dose (Delivered / Prescribed): 40 Gy / 40 Gy Prescribed Fxs (Delivered / Prescribed): 5 / 5     ==========ON TREATMENT VISIT DATES========== 2023-11-14, 2023-11-14, 2023-11-16, 2023-11-16, 2023-11-20, 2023-11-20, 2023-11-22, 2023-11-22, 2023-11-24, 2023-11-24, 2023-11-24     ==========UPCOMING VISITS========== 12/06/2023 [facility] INJECTION [facility] FLUSH  12/06/2023 [facility] EST PT 20 Kidney Norleen ONEIDA MADISON, MD  12/06/2023 [facility] LAB [facility]        ==========APPENDIX - ON TREATMENT VISIT NOTES==========   See weekly On Treatment Notes in Epic for details in the Media tab (listed as Progress notes on the On Treatment Visit Dates listed above).

## 2023-12-05 ENCOUNTER — Other Ambulatory Visit: Payer: Self-pay | Admitting: Hematology and Oncology

## 2023-12-05 ENCOUNTER — Other Ambulatory Visit: Payer: Self-pay

## 2023-12-05 DIAGNOSIS — C61 Malignant neoplasm of prostate: Secondary | ICD-10-CM

## 2023-12-05 NOTE — Progress Notes (Unsigned)
 [facility] [phone]   [phone]  PROGRESS NOTE  Patient Care Team: Auston Opal, DO as PCP - General (Family Medicine)  Hematological/Oncological History # Castration-Resistant Advanced Prostate Cancer  # Metastatic Spread to Bones  02/04/2017: Prostate biopsy showed a Gleason score of 4+4 = 8.  01/31/2018-04/2018: 5 cycles of Taxotere  75mg /m2  11/30/2021: last visit with Dr. Amadeo 03/03/2022: transfer care to Dr. Federico   Interval History:  Dennis Patel 73 y.o. male with medical history significant for Castration-Resistant Advanced Prostate Cancer presents for a follow up visit. The patient's last visit was on 06/27/2023. In the interim since the last visit he is continued on Eligard  and Zytiga .  On exam today Dennis Patel reports he has been well overall in the interim since her last visit.  He reports he is going to the gym about 3 days/week.  He reports his energy levels have been dropping however.  He is also been having some issues with hot flashes.  He reports that they occur periodically but sometimes they are little more tough to deal with.  He reports he is taking Zytiga  1000 mg p.o. daily without difficulty.  He is taking with prednisone  as prescribed.  He reports he does have some occasional bouts of headaches but they are not severe.  He notes that he does get some soreness after his Lupron  shot but otherwise tolerates it quite well.  He notes he is not having any pain elsewhere such as bone pain or back pain.  His appetite remains strong.  Overall he is willing and able to continue on his current treatment.  At our last visit his PSA was trending up slightly.  Today we discussed options for how to approach continually rising PSA.  Details of our conversation are noted below.  MEDICAL HISTORY:  Past Medical History:  Diagnosis Date   Arthritis    right pinky finger   Atherosclerosis    BPH (benign prostatic hyperplasia)    Cancer  (HCC)    Claudication    Elevated PSA    Family history of adverse reaction to anesthesia    ' My Brother had an allergic reaction    History of kidney stones    Hypertension    Peripheral arterial disease    Peripheral artery disease    Peripheral vascular disease    Pre-diabetes    prostate ca with bone mets dx'd 11/2017   Pure hypercholesterolemia    Skin cancer    Multiple    SURGICAL HISTORY: Past Surgical History:  Procedure Laterality Date   ABDOMINAL ANGIOGRAM N/A 03/17/2011   Procedure: ABDOMINAL ANGIOGRAM;  Surgeon: Candyce GORMAN Reek, MD;  Location: [facility] CATH LAB;  Service: Cardiovascular;  Laterality: N/A;   ABDOMINAL AORTOGRAM W/LOWER EXTREMITY N/A 03/17/2017   Procedure: ABDOMINAL AORTOGRAM W/LOWER EXTREMITY;  Surgeon: Eliza Lonni GORMAN, MD;  Location: [facility] INVASIVE CV LAB;  Service: Cardiovascular;  Laterality: N/A;   ABDOMINAL AORTOGRAM W/LOWER EXTREMITY N/A 09/08/2017   Procedure: ABDOMINAL AORTOGRAM W/LOWER EXTREMITY;  Surgeon: Eliza Lonni GORMAN, MD;  Location: [facility] INVASIVE CV LAB;  Service: Cardiovascular;  Laterality: N/A;   ABDOMINAL AORTOGRAM W/LOWER EXTREMITY N/A 02/13/2023   Procedure: ABDOMINAL AORTOGRAM W/LOWER EXTREMITY;  Surgeon: Pearline Norman GORMAN, MD;  Location: [facility] INVASIVE CV LAB;  Service: Cardiovascular;  Laterality: N/A;   APPLICATION OF WOUND VAC Right 03/16/2023   Procedure: APPLICATION OF WOUND VAC - PREVENA;  Surgeon: Pearline Norman GORMAN, MD;  Location: [facility] OR;  Service: Vascular;  Laterality: Right;   COLONOSCOPY  2022   ENDARTERECTOMY FEMORAL Right 03/16/2023   Procedure: ENDARTERECTOMY RIGHT COMMON FEMORAL;  Surgeon: Pearline Norman RAMAN, MD;  Location: [facility] OR;  Service: Vascular;  Laterality: Right;   ILIAC ARTERY STENT     INSERTION OF ILIAC STENT Right 03/16/2023   Procedure: INSERTION OF RIGHT COMMON ILIAC ARTERY AND RIGHT COMMON FEMORAL ARTERY STENT;  Surgeon: Pearline Norman RAMAN, MD;  Location: [facility] OR;  Service: Vascular;  Laterality: Right;    IR FLUORO GUIDE CV LINE RIGHT  12/07/2021   IR IMAGING GUIDED PORT INSERTION  01/30/2018   IR REMOVAL TUN ACCESS W/ PORT W/O FL MOD SED  12/07/2021   LOWER EXTREMITY ANGIOGRAM N/A 03/17/2011   Procedure: LOWER EXTREMITY ANGIOGRAM;  Surgeon: Candyce RAMAN Reek, MD;  Location: [facility] CATH LAB;  Service: Cardiovascular;  Laterality: N/A;   PATCH ANGIOPLASTY Right 03/16/2023   Procedure: PATCH ANGIOPLASTY RIGHT FEMORAL ARTERY USING XENOSURE BIOLOGIC PATCH;  Surgeon: Pearline Norman RAMAN, MD;  Location: [facility] OR;  Service: Vascular;  Laterality: Right;   PERIPHERAL VASCULAR INTERVENTION Left 09/08/2017   Procedure: PERIPHERAL VASCULAR INTERVENTION;  Surgeon: Eliza Lonni RAMAN, MD;  Location: [facility] INVASIVE CV LAB;  Service: Cardiovascular;  Laterality: Left;  Common iliac    SOCIAL HISTORY: Social History   Socioeconomic History   Marital status: Married    Spouse name: Not on file   Number of children: Not on file   Years of education: Not on file   Highest education level: Not on file  Occupational History   Not on file  Tobacco Use   Smoking status: Every Day    Types: Cigars   Smokeless tobacco: Never   Tobacco comments:    4-5 cigars per day. Last cigar 2/23 and is quitting cigars  Vaping Use   Vaping status: Never Used  Substance and Sexual Activity   Alcohol use: Never    Comment: 12/09/1993 quit drinking   Drug use: Never   Sexual activity: Yes  Other Topics Concern   Not on file  Social History Narrative   ** Merged History Encounter **       ** Merged History Encounter **       Social Drivers of Corporate Investment Banker Strain: Not on file  Food Insecurity: No Food Insecurity (10/23/2023)   Hunger Vital Sign    Worried About Running Out of Food in the Last Year: Never true    Ran Out of Food in the Last Year: Never true  Transportation Needs: No Transportation Needs (10/23/2023)   PRAPARE - Administrator, Civil Service (Medical): No    Lack of  Transportation (Non-Medical): No  Physical Activity: Not on file  Stress: Not on file  Social Connections: Moderately Integrated (03/16/2023)   Social Connection and Isolation Panel    Frequency of Communication with Friends and Family: More than three times a week    Frequency of Social Gatherings with Friends and Family: More than three times a week    Attends Religious Services: More than 4 times per year    Active Member of [employer] or Organizations: Yes    Attends Banker Meetings: More than 4 times per year    Marital Status: Widowed  Intimate Partner Violence: Not At Risk (10/23/2023)   Humiliation, Afraid, Rape, and Kick questionnaire    Fear of Current or Ex-Partner: No    Emotionally Abused: No    Physically Abused: No  Sexually Abused: No    FAMILY HISTORY: Family History  Problem Relation Age of Onset   Ovarian cancer Mother 25   Diabetes Mother    CAD Father    Diabetes Sister    Lymphoma Brother 60   Skin cancer Brother        basal and squamous cell   Ovarian cancer Maternal Grandmother    CAD Son    Diabetes Son    Heart disease Son    Ovarian cancer Maternal Great-grandmother     ALLERGIES:  has no known allergies.  MEDICATIONS:  Current Outpatient Medications  Medication Sig Dispense Refill   abiraterone  acetate (ZYTIGA ) 250 MG tablet Take 4 tablets (1,000 mg total) by mouth daily. Take on an empty stomach 1 hour before or 2 hours after a meal 120 tablet 9   acetaminophen  (TYLENOL ) 500 MG tablet Take 1,000 mg by mouth at bedtime.     amLODipine  (NORVASC ) 10 MG tablet Take 1 tablet (10 mg total) by mouth daily. 30 tablet 0   aspirin  EC 81 MG tablet Take 81 mg by mouth daily. Swallow whole.     atorvastatin  (LIPITOR) 10 MG tablet TAKE 1 TABLET BY MOUTH EVERY DAY 90 tablet 3   calcium -vitamin D (OSCAL WITH D) 250-125 MG-UNIT tablet Take 1 tablet by mouth in the morning.     clopidogrel  (PLAVIX ) 75 MG tablet Take 1 tablet (75 mg total) by mouth  daily. 90 tablet 1   famotidine  (PEPCID ) 10 MG tablet Take 10 mg by mouth in the morning.     gabapentin  (NEURONTIN ) 300 MG capsule Take 1 capsule (300 mg total) by mouth 2 (two) times daily. 180 capsule 1   hydrochlorothiazide  (HYDRODIURIL ) 12.5 MG tablet Take 12.5 mg by mouth every morning.     Multiple Vitamins-Minerals (ONE DAILY MULTIVITAMIN MEN PO) Take 1 tablet by mouth in the morning.     naproxen sodium (ALEVE) 220 MG tablet Take 220 mg by mouth in the morning.     predniSONE  (DELTASONE ) 5 MG tablet TAKE 1 TABLET BY MOUTH EVERY DAY WITH BREAKFAST 90 tablet 3   traZODone  (DESYREL ) 50 MG tablet TAKE 1 TABLET (50 MG TOTAL) BY MOUTH AT BEDTIME AS NEEDED. FOR SLEEP 90 tablet 1   No current facility-administered medications for this visit.    REVIEW OF SYSTEMS:   Constitutional: ( - ) fevers, ( - )  chills , ( - ) night sweats Eyes: ( - ) blurriness of vision, ( - ) double vision, ( - ) watery eyes Ears, nose, mouth, throat, and face: ( - ) mucositis, ( - ) sore throat Respiratory: ( - ) cough, ( - ) dyspnea, ( - ) wheezes Cardiovascular: ( - ) palpitation, ( - ) chest discomfort, ( - ) lower extremity swelling Gastrointestinal:  ( - ) nausea, ( - ) heartburn, ( - ) change in bowel habits Skin: ( - ) abnormal skin rashes Lymphatics: ( - ) new lymphadenopathy, ( - ) easy bruising Neurological: ( - ) numbness, ( - ) tingling, ( - ) new weaknesses Behavioral/Psych: ( - ) mood change, ( - ) new changes  All other systems were reviewed with the patient and are negative.  PHYSICAL EXAMINATION: ECOG PERFORMANCE STATUS: 1 - Symptomatic but completely ambulatory  There were no vitals filed for this visit.    There were no vitals filed for this visit.     GENERAL: Well-appearing elderly Caucasian male, alert, no distress and comfortable SKIN: skin  color, texture, turgor are normal, no rashes or significant lesions EYES: conjunctiva are pink and non-injected, sclera clear LUNGS: clear  to auscultation and percussion with normal breathing effort HEART: regular rate & rhythm and no murmurs and no lower extremity edema Musculoskeletal: no cyanosis of digits and no clubbing  PSYCH: alert & oriented x 3, fluent speech NEURO: no focal motor/sensory deficits  LABORATORY DATA:  I have reviewed the data as listed    Latest Ref Rng & Units 09/13/2023    9:17 AM 06/27/2023    8:37 AM 03/17/2023    3:23 AM  CBC  WBC 4.0 - 10.5 K/uL 10.2  9.0  11.7   Hemoglobin 13.0 - 17.0 g/dL 83.7  84.9  87.6   Hematocrit 39.0 - 52.0 % 45.5  42.3  34.3   Platelets 150 - 400 K/uL 219  202  160        Latest Ref Rng & Units 09/13/2023    9:17 AM 06/27/2023    8:37 AM 03/17/2023    3:23 AM  CMP  Glucose 70 - 99 mg/dL 853  790  881   BUN 8 - 23 mg/dL 23  21  14    Creatinine 0.61 - 1.24 mg/dL 8.91  8.91  8.93   Sodium 135 - 145 mmol/L 141  141  139   Potassium 3.5 - 5.1 mmol/L 3.8  3.6  3.3   Chloride 98 - 111 mmol/L 105  106  106   CO2 22 - 32 mmol/L 28  27  25    Calcium  8.9 - 10.3 mg/dL 9.9  9.4  8.5   Total Protein 6.5 - 8.1 g/dL 6.8  6.4    Total Bilirubin 0.0 - 1.2 mg/dL 0.7  0.5    Alkaline Phos 38 - 126 U/L 86  79    AST 15 - 41 U/L 19  19    ALT 0 - 44 U/L 22  24     RADIOGRAPHIC STUDIES: VAS US  AORTA/IVC/ILIACS Result Date: 11/10/2023 ABDOMINAL AORTA STUDY Patient Name:  KAZIMIERZ SPRINGBORN  Date of Exam:   11/10/2023 Medical Rec #: [id number]        Accession #:    [id number] Date of Birth: December 27, 1950       Patient Gender: M Patient Age:   69 years Exam Location:  [address] Procedure:      VAS US  AORTA/IVC/ILIACS Referring Phys: NORMAN SERVE --------------------------------------------------------------------------------  Indications: Peripheral arterial disease. Patient reports left hip and right              ankle pain after walking 10 minutes.               Today's ABIs are .50 on the right and .64 on the left. Risk Factors: Current smoker. Vascular Interventions: Procedure on  03/17/2011:                         Abdominal aortogram, pelvic angiogram; right lower                         extremity runoff, left lower extremity runoff.                         Rotational atherectomy of the left, distal SFA. PTA of  the left distal SFA.                         Procedure on 03/17/2017:                         Angioplasty and stenting of the left common iliac artery                         (7 mm x 29 mm VBX covered stent)                         Procedure on 03/16/2023:                         Stenting of the right external and common iliac arteries                         (7 mm x 7.5 cm Viabahn, 8 mm x 60 mm Zilver PTX). Limitations: Air/bowel gas and obesity.  Comparison Study: On 08/11/2023, an aortoiliac duplex showed a patent right                   common to external iliac artery stent without evidence of                   stenosis. Patent left CIA stent without evidence of stenosis                   and >50% stenosis in the left mid EIA. Performing Technologist: Nanetta Shad RVT  Examination Guidelines: A complete evaluation includes B-mode imaging, spectral Doppler, color Doppler, and power Doppler as needed of all accessible portions of each vessel. Bilateral testing is considered an integral part of a complete examination. Limited examinations for reoccurring indications may be performed as noted.  Abdominal Aorta Findings: +-------------+-------+----------+----------+--------+--------+--------+ Location     AP (cm)Trans (cm)PSV (cm/s)WaveformThrombusComments +-------------+-------+----------+----------+--------+--------+--------+ Proximal                      61                                 +-------------+-------+----------+----------+--------+--------+--------+ Mid                           55                                 +-------------+-------+----------+----------+--------+--------+--------+ Distal                        47                                  +-------------+-------+----------+----------+--------+--------+--------+ LT EIA Mid                    114                                +-------------+-------+----------+----------+--------+--------+--------+ LT EIA Distal  140                                +-------------+-------+----------+----------+--------+--------+--------+ IVC/Iliac Findings: +--------+------+--------+--------+   IVC   PatentThrombusComments +--------+------+--------+--------+ IVC Proxpatent                 +--------+------+--------+--------+   Right Stent(s): +-------------------+--------+---------------+-----------+--------+ Common Iliac ArteryPSV cm/sStenosis       Waveform   Comments +-------------------+--------+---------------+-----------+--------+ Prox to Stent      33                     biphasic   dampened +-------------------+--------+---------------+-----------+--------+ Proximal Stent     111                    multiphasic         +-------------------+--------+---------------+-----------+--------+ Mid Stent          304     50-99% stenosismultiphasicVR 2.7   +-------------------+--------+---------------+-----------+--------+ Distal Stent       106                    monophasic          +-------------------+--------+---------------+-----------+--------+ Distal to Stent    89                     monophasic          +-------------------+--------+---------------+-----------+--------+  +---------------------+--------+--------+-----------+--------+ External Iliac ArteryPSV cm/sStenosisWaveform   Comments +---------------------+--------+--------+-----------+--------+ Prox to Stent        89              monophasic          +---------------------+--------+--------+-----------+--------+ Proximal Stent       93              monophasic          +---------------------+--------+--------+-----------+--------+ Mid  Stent            143             multiphasic         +---------------------+--------+--------+-----------+--------+ Distal Stent         153             biphasic            +---------------------+--------+--------+-----------+--------+ Distal to Stent      162             monophasic          +---------------------+--------+--------+-----------+--------+  Left Stent(s): +-------------------+--------+---------------+----------+--------+ Common Iliac ArteryPSV cm/sStenosis       Waveform  Comments +-------------------+--------+---------------+----------+--------+ Prox to Stent      40                     monophasic         +-------------------+--------+---------------+----------+--------+ Proximal Stent     78                     monophasic         +-------------------+--------+---------------+----------+--------+ Mid Stent          69                     monophasic         +-------------------+--------+---------------+----------+--------+ Distal Stent       206     50-99% stenosismonophasicVR 3.0   +-------------------+--------+---------------+----------+--------+ Distal to Stent    179  monophasic         +-------------------+--------+---------------+----------+--------+   Summary: Stenosis: +--------------------+---------------+------------+ Location            Stent          Comments     +--------------------+---------------+------------+ Right Common Iliac  50-99% stenosismid stent    +--------------------+---------------+------------+ Left Common Iliac   50-99% stenosisdistal stent +--------------------+---------------+------------+ Right External Iliacno stenosis                 +--------------------+---------------+------------+  IVC/Iliac: Patent IVC.  *See table(s) above for measurements and observations.  Electronically signed by Norman Serve on 11/10/2023 at 10:44:13 AM. See ABI report.    Final    VAS US  ABI WITH/WO  TBI Result Date: 11/10/2023  LOWER EXTREMITY DOPPLER STUDY Patient Name:  KIPTON SKILLEN  Date of Exam:   11/10/2023 Medical Rec #: [id number]        Accession #:    [id number] Date of Birth: [birth year]       Patient Gender: M Patient Age:   54 years Exam Location:  [address] Procedure:      VAS US  ABI WITH/WO TBI Referring Phys: NORMAN SERVE --------------------------------------------------------------------------------  Indications: Peripheral artery disease. Patient reports left hip and right ankle              pain after walking 10 minutes. High Risk Factors: Current smoker.  Vascular Interventions: Procedure on 03/17/2011:                         Abdominal aortogram, pelvic angiogram; right lower                         extremity runoff, left lower extremity runoff.                         Rotational atherectomy of the left, distal SFA. PTA of                         the left distal SFA.                         Procedure on 03/17/2017:                         Angioplasty and stenting of the left common iliac artery                         (7 mm x 29 mm VBX covered stent)                         Procedure on 03/16/2023:                         Stenting of the right external and common iliac arteries                         (7 mm x 7.5 cm Viabahn, 8 mm x 60 mm Zilver PTX). Comparison Study: On 08/11/2023, a lower arterial Doppler showed an ABI of .53                   on the right and .61 on the left. Performing Technologist: Nanetta Shad RVT  Examination Guidelines: A  complete evaluation includes at minimum, Doppler waveform signals and systolic blood pressure reading at the level of bilateral brachial, anterior tibial, and posterior tibial arteries, when vessel segments are accessible. Bilateral testing is considered an integral part of a complete examination. Photoelectric Plethysmograph (PPG) waveforms and toe systolic pressure readings are included as required and additional duplex testing as  needed. Limited examinations for reoccurring indications may be performed as noted.  ABI Findings: +---------+------------------+-----+----------+--------+ Right    Rt Pressure (mmHg)IndexWaveform  Comment  +---------+------------------+-----+----------+--------+ Brachial 146                                       +---------+------------------+-----+----------+--------+ PTA      71                0.48 monophasic         +---------+------------------+-----+----------+--------+ DP       75                0.50 monophasic         +---------+------------------+-----+----------+--------+ Great Toe17                0.11 Abnormal           +---------+------------------+-----+----------+--------+ +---------+------------------+-----+----------+-------+ Left     Lt Pressure (mmHg)IndexWaveform  Comment +---------+------------------+-----+----------+-------+ Brachial 149                                      +---------+------------------+-----+----------+-------+ PTA      95                0.64 monophasic        +---------+------------------+-----+----------+-------+ DP       74                0.50 monophasic        +---------+------------------+-----+----------+-------+ Great Toe0                 0.00 Absent            +---------+------------------+-----+----------+-------+ +-------+-----------+-----------+------------+------------+ ABI/TBIToday's ABIToday's TBIPrevious ABIPrevious TBI +-------+-----------+-----------+------------+------------+ Right  .50        .11        .53         .40          +-------+-----------+-----------+------------+------------+ Left   .64        0.0        .61         .44          +-------+-----------+-----------+------------+------------+  Bilateral ABIs appear essentially unchanged compared to prior study on 08/11/2023. Compared to prior study on 07/22/2023.  Summary: Right: Resting right ankle-brachial index indicates  moderate right lower extremity arterial disease. The right toe-brachial index is abnormal.  Left: Resting left ankle-brachial index indicates moderate left lower extremity arterial disease. The left toe-brachial index is abnormal.  *See table(s) above for measurements and observations.  See Aortoiliac duplex report. Electronically signed by Norman Serve on 11/10/2023 at 10:43:59 AM.    Final      ASSESSMENT & PLAN Dennis Patel 73 y.o. male with medical history significant for Castration-Resistant Advanced Prostate Cancer  presents for a follow up visit.  # Castration-Resistant Advanced Prostate Cancer  # Metastatic Spread to Bones  -- currently on Eligard  45mg  q 6 months, last dose on 02/2023. Next due today.  Due again in Nov 2025.  -- continue Zytiga  1000 mg daily with prednisone  5 mg daily, started in May 2020  -- Labs today show white blood cell count 10.2, Hgb 16.2, MCV 89.6, Plt 219  --PSA at 0.7 at last check in June 2025. If PSA is continuing to rise today will order new imaging and discuss transitioning to another treatment.  -- Plan to have patient return to clinic in 3 months time for labs and clinic visit/injection   # Chronic Right ankle Pain -- Follow-up after completion of docetaxel  chemotherapy. -- Notable difference in the thickness of his left ankle and right ankle.  Right ankle appears considerably less robust. --Patient evaluated by vascular surgery, underwent right common femoral endarterectomy on 03/16/2023.   No orders of the defined types were placed in this encounter.   All questions were answered. The patient knows to call the clinic with any problems, questions or concerns.  A total of more than 30 minutes were spent on this encounter with face-to-face time and non-face-to-face time, including preparing to see the patient, ordering tests and/or medications, counseling the patient and coordination of care as outlined above.   Norleen IVAR Kidney, MD Department of  Hematology/Oncology [facility] at [facility] Phone: [phone] Pager: [phone] Email: [email or web address]  12/05/2023 8:15 PM

## 2023-12-06 ENCOUNTER — Inpatient Hospital Stay

## 2023-12-06 ENCOUNTER — Inpatient Hospital Stay: Attending: Internal Medicine

## 2023-12-06 ENCOUNTER — Inpatient Hospital Stay: Admitting: Hematology and Oncology

## 2023-12-06 VITALS — BP 143/68 | HR 57 | Temp 98.1°F | Resp 15 | Wt 186.9 lb

## 2023-12-06 DIAGNOSIS — I10 Essential (primary) hypertension: Secondary | ICD-10-CM | POA: Diagnosis not present

## 2023-12-06 DIAGNOSIS — Z7982 Long term (current) use of aspirin: Secondary | ICD-10-CM | POA: Diagnosis not present

## 2023-12-06 DIAGNOSIS — C61 Malignant neoplasm of prostate: Secondary | ICD-10-CM

## 2023-12-06 DIAGNOSIS — Z800 Family history of malignant neoplasm of digestive organs: Secondary | ICD-10-CM | POA: Diagnosis not present

## 2023-12-06 DIAGNOSIS — F17210 Nicotine dependence, cigarettes, uncomplicated: Secondary | ICD-10-CM | POA: Diagnosis not present

## 2023-12-06 DIAGNOSIS — Z7902 Long term (current) use of antithrombotics/antiplatelets: Secondary | ICD-10-CM | POA: Diagnosis not present

## 2023-12-06 DIAGNOSIS — Z95828 Presence of other vascular implants and grafts: Secondary | ICD-10-CM

## 2023-12-06 DIAGNOSIS — Z85828 Personal history of other malignant neoplasm of skin: Secondary | ICD-10-CM | POA: Diagnosis not present

## 2023-12-06 DIAGNOSIS — E895 Postprocedural testicular hypofunction: Secondary | ICD-10-CM | POA: Diagnosis not present

## 2023-12-06 DIAGNOSIS — C7951 Secondary malignant neoplasm of bone: Secondary | ICD-10-CM | POA: Diagnosis not present

## 2023-12-06 DIAGNOSIS — R42 Dizziness and giddiness: Secondary | ICD-10-CM | POA: Diagnosis not present

## 2023-12-06 DIAGNOSIS — Z7952 Long term (current) use of systemic steroids: Secondary | ICD-10-CM | POA: Diagnosis not present

## 2023-12-06 DIAGNOSIS — I2510 Atherosclerotic heart disease of native coronary artery without angina pectoris: Secondary | ICD-10-CM | POA: Diagnosis not present

## 2023-12-06 DIAGNOSIS — Z87442 Personal history of urinary calculi: Secondary | ICD-10-CM | POA: Diagnosis not present

## 2023-12-06 DIAGNOSIS — M1990 Unspecified osteoarthritis, unspecified site: Secondary | ICD-10-CM | POA: Diagnosis not present

## 2023-12-06 DIAGNOSIS — M79671 Pain in right foot: Secondary | ICD-10-CM | POA: Diagnosis not present

## 2023-12-06 DIAGNOSIS — Z923 Personal history of irradiation: Secondary | ICD-10-CM | POA: Diagnosis not present

## 2023-12-06 DIAGNOSIS — Z79899 Other long term (current) drug therapy: Secondary | ICD-10-CM | POA: Diagnosis not present

## 2023-12-06 DIAGNOSIS — Z8041 Family history of malignant neoplasm of ovary: Secondary | ICD-10-CM | POA: Diagnosis not present

## 2023-12-06 DIAGNOSIS — E7800 Pure hypercholesterolemia, unspecified: Secondary | ICD-10-CM | POA: Diagnosis not present

## 2023-12-06 DIAGNOSIS — Z192 Hormone resistant malignancy status: Secondary | ICD-10-CM | POA: Diagnosis not present

## 2023-12-06 DIAGNOSIS — M25571 Pain in right ankle and joints of right foot: Secondary | ICD-10-CM | POA: Diagnosis not present

## 2023-12-06 LAB — CBC WITH DIFFERENTIAL (CANCER CENTER ONLY)
Abs Immature Granulocytes: 0.04 K/uL (ref 0.00–0.07)
Basophils Absolute: 0 K/uL (ref 0.0–0.1)
Basophils Relative: 0 %
Eosinophils Absolute: 0.1 K/uL (ref 0.0–0.5)
Eosinophils Relative: 1 %
HCT: 42.3 % (ref 39.0–52.0)
Hemoglobin: 15.3 g/dL (ref 13.0–17.0)
Immature Granulocytes: 0 %
Lymphocytes Relative: 6 %
Lymphs Abs: 0.6 K/uL — ABNORMAL LOW (ref 0.7–4.0)
MCH: 32.1 pg (ref 26.0–34.0)
MCHC: 36.2 g/dL — ABNORMAL HIGH (ref 30.0–36.0)
MCV: 88.7 fL (ref 80.0–100.0)
Monocytes Absolute: 0.8 K/uL (ref 0.1–1.0)
Monocytes Relative: 8 %
Neutro Abs: 8.8 K/uL — ABNORMAL HIGH (ref 1.7–7.7)
Neutrophils Relative %: 85 %
Platelet Count: 149 K/uL — ABNORMAL LOW (ref 150–400)
RBC: 4.77 MIL/uL (ref 4.22–5.81)
RDW: 12.2 % (ref 11.5–15.5)
WBC Count: 10.4 K/uL (ref 4.0–10.5)
nRBC: 0 % (ref 0.0–0.2)

## 2023-12-06 LAB — CMP (CANCER CENTER ONLY)
ALT: 22 U/L (ref 0–44)
AST: 22 U/L (ref 15–41)
Albumin: 4.4 g/dL (ref 3.5–5.0)
Alkaline Phosphatase: 84 U/L (ref 38–126)
Anion gap: 11 (ref 5–15)
BUN: 17 mg/dL (ref 8–23)
CO2: 26 mmol/L (ref 22–32)
Calcium: 9.7 mg/dL (ref 8.9–10.3)
Chloride: 105 mmol/L (ref 98–111)
Creatinine: 1.04 mg/dL (ref 0.61–1.24)
GFR, Estimated: >60 mL/min (ref >60)
Glucose, Bld: 142 mg/dL — ABNORMAL HIGH (ref 70–99)
Potassium: 4 mmol/L (ref 3.5–5.1)
Sodium: 142 mmol/L (ref 135–145)
Total Bilirubin: 0.7 mg/dL (ref 0.0–1.2)
Total Protein: 6.5 g/dL (ref 6.5–8.1)

## 2023-12-06 LAB — PSA: Prostatic Specific Antigen: 0.39 ng/mL (ref 0.00–4.00)

## 2023-12-07 ENCOUNTER — Ambulatory Visit: Payer: Self-pay

## 2023-12-07 LAB — TESTOSTERONE: Testosterone: <3 ng/dL — ABNORMAL LOW (ref 264–916)

## 2023-12-07 NOTE — Telephone Encounter (Signed)
-----   Message from Nurse Almarie T sent at 12/07/2023  9:01 AM EST -----  ----- Message ----- From: Federico Norleen ONEIDA MADISON, MD Sent: 12/07/2023   7:36 AM EST To: Almarie DELENA Arabia, RN  Please let Mr. Dennis Patel know that his PSA improved markedly, down to 0.39 from 1.1 at our last check. This is an excellent response to radiation. We will plan to see him back as scheduled in Feb 2026  for his next shot and repeat labs.  ----- Message ----- From: Rebecka, [facility] [place] Sent: 12/06/2023   9:35 AM EST To: Norleen ONEIDA Federico MADISON, MD

## 2023-12-07 NOTE — Telephone Encounter (Signed)
 Spoke with pt on phone regarding most recent lab results. Pt expressed that he is pleased with the progress.  Stated understanding of appts with lab/ clinic/ injection scheduled 02/28/24

## 2023-12-11 ENCOUNTER — Other Ambulatory Visit (HOSPITAL_COMMUNITY): Payer: Self-pay

## 2023-12-18 ENCOUNTER — Other Ambulatory Visit: Payer: Self-pay

## 2023-12-20 ENCOUNTER — Other Ambulatory Visit: Payer: Self-pay

## 2023-12-20 NOTE — Progress Notes (Signed)
 Specialty Pharmacy Refill Coordination Note  Dennis Patel is a 73 y.o. male contacted today regarding refills of specialty medication(s) Abiraterone  Acetate (ZYTIGA )   Patient requested Delivery   Delivery date: 12/27/23   Verified address: [address] [place] [address]   Medication will be filled on: 12/26/23   Patient aware of $78.08 copay and provided cc to store on file.

## 2023-12-25 ENCOUNTER — Other Ambulatory Visit: Payer: Self-pay

## 2024-01-16 ENCOUNTER — Other Ambulatory Visit: Payer: Self-pay

## 2024-01-19 ENCOUNTER — Other Ambulatory Visit (HOSPITAL_COMMUNITY): Payer: Self-pay

## 2024-01-20 ENCOUNTER — Other Ambulatory Visit: Payer: Self-pay | Admitting: Hematology and Oncology

## 2024-01-20 DIAGNOSIS — C61 Malignant neoplasm of prostate: Secondary | ICD-10-CM

## 2024-01-21 ENCOUNTER — Encounter: Payer: Self-pay | Admitting: Hematology and Oncology

## 2024-01-23 ENCOUNTER — Encounter: Payer: Self-pay | Admitting: Hematology and Oncology

## 2024-01-23 ENCOUNTER — Other Ambulatory Visit: Payer: Self-pay

## 2024-01-23 ENCOUNTER — Other Ambulatory Visit (HOSPITAL_COMMUNITY): Payer: Self-pay

## 2024-01-23 ENCOUNTER — Telehealth: Payer: Self-pay

## 2024-01-23 NOTE — Telephone Encounter (Signed)
 Oral Oncology Patient Advocate Encounter  Was successful in securing patient a $6000 grant from [facility] to provide copayment coverage for ZYTIGA .  This will keep the out of pocket expense at $0.     [employer] ID: [id number]   The billing information is as follows and has been shared with [facility].    RxBin: [id number] PCN: [id number] Member ID: [id number] Group ID: [id number] Dates of Eligibility: 12/23/24 through 12/22/24  Fund:  prostate  Lucie Lamer, CPhT [place]  [facility] Oncology Pharmacy Patient Advocate Specialist [facility] [place] Phone: [phone]  Fax: [phone] [email or web address]

## 2024-01-24 ENCOUNTER — Inpatient Hospital Stay (HOSPITAL_COMMUNITY)
Admission: EM | Admit: 2024-01-24 | Discharge: 2024-01-27 | DRG: 193 | Disposition: A | Attending: Student | Admitting: Student

## 2024-01-24 ENCOUNTER — Other Ambulatory Visit: Payer: Self-pay

## 2024-01-24 ENCOUNTER — Encounter (HOSPITAL_COMMUNITY): Payer: Self-pay | Admitting: Emergency Medicine

## 2024-01-24 ENCOUNTER — Emergency Department (HOSPITAL_COMMUNITY)

## 2024-01-24 DIAGNOSIS — N400 Enlarged prostate without lower urinary tract symptoms: Secondary | ICD-10-CM | POA: Diagnosis present

## 2024-01-24 DIAGNOSIS — M19041 Primary osteoarthritis, right hand: Secondary | ICD-10-CM | POA: Diagnosis present

## 2024-01-24 DIAGNOSIS — M546 Pain in thoracic spine: Secondary | ICD-10-CM

## 2024-01-24 DIAGNOSIS — J9601 Acute respiratory failure with hypoxia: Secondary | ICD-10-CM | POA: Diagnosis present

## 2024-01-24 DIAGNOSIS — C61 Malignant neoplasm of prostate: Secondary | ICD-10-CM | POA: Diagnosis present

## 2024-01-24 DIAGNOSIS — Z85828 Personal history of other malignant neoplasm of skin: Secondary | ICD-10-CM

## 2024-01-24 DIAGNOSIS — Z7902 Long term (current) use of antithrombotics/antiplatelets: Secondary | ICD-10-CM

## 2024-01-24 DIAGNOSIS — Z79899 Other long term (current) drug therapy: Secondary | ICD-10-CM

## 2024-01-24 DIAGNOSIS — I739 Peripheral vascular disease, unspecified: Secondary | ICD-10-CM | POA: Diagnosis present

## 2024-01-24 DIAGNOSIS — F17290 Nicotine dependence, other tobacco product, uncomplicated: Secondary | ICD-10-CM | POA: Diagnosis present

## 2024-01-24 DIAGNOSIS — J1100 Influenza due to unidentified influenza virus with unspecified type of pneumonia: Secondary | ICD-10-CM | POA: Diagnosis present

## 2024-01-24 DIAGNOSIS — Z7982 Long term (current) use of aspirin: Secondary | ICD-10-CM

## 2024-01-24 DIAGNOSIS — Z8249 Family history of ischemic heart disease and other diseases of the circulatory system: Secondary | ICD-10-CM

## 2024-01-24 DIAGNOSIS — Z791 Long term (current) use of non-steroidal anti-inflammatories (NSAID): Secondary | ICD-10-CM

## 2024-01-24 DIAGNOSIS — J1108 Influenza due to unidentified influenza virus with specified pneumonia: Principal | ICD-10-CM | POA: Diagnosis present

## 2024-01-24 DIAGNOSIS — R911 Solitary pulmonary nodule: Secondary | ICD-10-CM | POA: Diagnosis present

## 2024-01-24 DIAGNOSIS — R7303 Prediabetes: Secondary | ICD-10-CM | POA: Diagnosis present

## 2024-01-24 DIAGNOSIS — J189 Pneumonia, unspecified organism: Secondary | ICD-10-CM | POA: Diagnosis not present

## 2024-01-24 DIAGNOSIS — Z87442 Personal history of urinary calculi: Secondary | ICD-10-CM

## 2024-01-24 DIAGNOSIS — M549 Dorsalgia, unspecified: Secondary | ICD-10-CM | POA: Diagnosis present

## 2024-01-24 DIAGNOSIS — R062 Wheezing: Secondary | ICD-10-CM

## 2024-01-24 DIAGNOSIS — J159 Unspecified bacterial pneumonia: Secondary | ICD-10-CM | POA: Diagnosis present

## 2024-01-24 DIAGNOSIS — Z808 Family history of malignant neoplasm of other organs or systems: Secondary | ICD-10-CM

## 2024-01-24 DIAGNOSIS — C7951 Secondary malignant neoplasm of bone: Secondary | ICD-10-CM | POA: Diagnosis present

## 2024-01-24 DIAGNOSIS — Z833 Family history of diabetes mellitus: Secondary | ICD-10-CM

## 2024-01-24 DIAGNOSIS — E7800 Pure hypercholesterolemia, unspecified: Secondary | ICD-10-CM | POA: Diagnosis present

## 2024-01-24 DIAGNOSIS — D849 Immunodeficiency, unspecified: Secondary | ICD-10-CM | POA: Diagnosis present

## 2024-01-24 DIAGNOSIS — I10 Essential (primary) hypertension: Secondary | ICD-10-CM | POA: Diagnosis present

## 2024-01-24 DIAGNOSIS — Z7952 Long term (current) use of systemic steroids: Secondary | ICD-10-CM

## 2024-01-24 LAB — CBC
HCT: 43.1 % (ref 39.0–52.0)
Hemoglobin: 15.1 g/dL (ref 13.0–17.0)
MCH: 32.2 pg (ref 26.0–34.0)
MCHC: 35 g/dL (ref 30.0–36.0)
MCV: 91.9 fL (ref 80.0–100.0)
Platelets: 254 K/uL (ref 150–400)
RBC: 4.69 MIL/uL (ref 4.22–5.81)
RDW: 12.4 % (ref 11.5–15.5)
WBC: 22 K/uL — ABNORMAL HIGH (ref 4.0–10.5)
nRBC: 0 % (ref 0.0–0.2)

## 2024-01-24 LAB — BASIC METABOLIC PANEL WITH GFR
Anion gap: 13 (ref 5–15)
BUN: 20 mg/dL (ref 8–23)
CO2: 25 mmol/L (ref 22–32)
Calcium: 9.8 mg/dL (ref 8.9–10.3)
Chloride: 100 mmol/L (ref 98–111)
Creatinine, Ser: 1.06 mg/dL (ref 0.61–1.24)
GFR, Estimated: >60 mL/min
Glucose, Bld: 105 mg/dL — ABNORMAL HIGH (ref 70–99)
Potassium: 4.1 mmol/L (ref 3.5–5.1)
Sodium: 138 mmol/L (ref 135–145)

## 2024-01-24 LAB — PRO BRAIN NATRIURETIC PEPTIDE: Pro Brain Natriuretic Peptide: 179 pg/mL

## 2024-01-24 LAB — TROPONIN T, HIGH SENSITIVITY: Troponin T High Sensitivity: <15 ng/L (ref 0–19)

## 2024-01-24 MED ORDER — PREDNISONE 20 MG PO TABS
40.0000 mg | ORAL_TABLET | Freq: Two times a day (BID) | ORAL | Status: DC
  Administered 2024-01-24 – 2024-01-25 (×3): 40 mg via ORAL
  Filled 2024-01-24 (×3): qty 2

## 2024-01-24 MED ORDER — ACETAMINOPHEN 325 MG PO TABS
650.0000 mg | ORAL_TABLET | Freq: Four times a day (QID) | ORAL | Status: DC | PRN
  Administered 2024-01-24 – 2024-01-27 (×4): 650 mg via ORAL
  Filled 2024-01-24 (×4): qty 2

## 2024-01-24 MED ORDER — ATORVASTATIN CALCIUM 10 MG PO TABS
10.0000 mg | ORAL_TABLET | Freq: Every day | ORAL | Status: DC
  Administered 2024-01-25 – 2024-01-27 (×3): 10 mg via ORAL
  Filled 2024-01-24 (×3): qty 1

## 2024-01-24 MED ORDER — AZITHROMYCIN 250 MG PO TABS: 500.0000 mg | ORAL_TABLET | Freq: Every day | ORAL | Status: DC

## 2024-01-24 MED ORDER — OSELTAMIVIR PHOSPHATE 75 MG PO CAPS
75.0000 mg | ORAL_CAPSULE | Freq: Two times a day (BID) | ORAL | Status: DC
  Administered 2024-01-24 – 2024-01-27 (×6): 75 mg via ORAL
  Filled 2024-01-24 (×6): qty 1

## 2024-01-24 MED ORDER — SODIUM CHLORIDE 0.9 % IV SOLN: 2.0000 g | INTRAVENOUS | Status: DC

## 2024-01-24 MED ORDER — IPRATROPIUM-ALBUTEROL 0.5-2.5 (3) MG/3ML IN SOLN
3.0000 mL | Freq: Once | RESPIRATORY_TRACT | Status: AC
  Administered 2024-01-24: 3 mL via RESPIRATORY_TRACT
  Filled 2024-01-24: qty 3

## 2024-01-24 MED ORDER — AZITHROMYCIN 250 MG PO TABS
500.0000 mg | ORAL_TABLET | Freq: Once | ORAL | Status: AC
  Administered 2024-01-24: 500 mg via ORAL
  Filled 2024-01-24: qty 2

## 2024-01-24 MED ORDER — AZITHROMYCIN 250 MG PO TABS
500.0000 mg | ORAL_TABLET | Freq: Every day | ORAL | Status: DC
  Administered 2024-01-25 – 2024-01-26 (×2): 500 mg via ORAL
  Filled 2024-01-24 (×2): qty 2

## 2024-01-24 MED ORDER — ASPIRIN 81 MG PO TBEC
81.0000 mg | DELAYED_RELEASE_TABLET | Freq: Every day | ORAL | Status: DC
  Administered 2024-01-25 – 2024-01-27 (×3): 81 mg via ORAL
  Filled 2024-01-24 (×3): qty 1

## 2024-01-24 MED ORDER — SODIUM CHLORIDE 0.9 % IV SOLN: INTRAVENOUS | Status: DC

## 2024-01-24 MED ORDER — IOHEXOL 350 MG/ML SOLN
75.0000 mL | Freq: Once | INTRAVENOUS | Status: AC | PRN
  Administered 2024-01-24: 75 mL via INTRAVENOUS

## 2024-01-24 MED ORDER — ENOXAPARIN SODIUM 40 MG/0.4ML IJ SOSY
40.0000 mg | PREFILLED_SYRINGE | INTRAMUSCULAR | Status: DC
  Administered 2024-01-24 – 2024-01-26 (×3): 40 mg via SUBCUTANEOUS
  Filled 2024-01-24 (×3): qty 0.4

## 2024-01-24 MED ORDER — IPRATROPIUM-ALBUTEROL 0.5-2.5 (3) MG/3ML IN SOLN
3.0000 mL | Freq: Four times a day (QID) | RESPIRATORY_TRACT | Status: DC | PRN
  Administered 2024-01-24: 3 mL via RESPIRATORY_TRACT
  Filled 2024-01-24: qty 3

## 2024-01-24 MED ORDER — ONDANSETRON HCL 4 MG PO TABS: 4.0000 mg | ORAL_TABLET | Freq: Four times a day (QID) | ORAL | Status: DC | PRN

## 2024-01-24 MED ORDER — CLOPIDOGREL BISULFATE 75 MG PO TABS
75.0000 mg | ORAL_TABLET | Freq: Every day | ORAL | Status: DC
  Administered 2024-01-25 – 2024-01-27 (×3): 75 mg via ORAL
  Filled 2024-01-24 (×3): qty 1

## 2024-01-24 MED ORDER — GABAPENTIN 300 MG PO CAPS
300.0000 mg | ORAL_CAPSULE | Freq: Two times a day (BID) | ORAL | Status: DC
  Administered 2024-01-24 – 2024-01-27 (×6): 300 mg via ORAL
  Filled 2024-01-24 (×6): qty 1

## 2024-01-24 MED ORDER — ONDANSETRON HCL 4 MG/2ML IJ SOLN: 4.0000 mg | Freq: Four times a day (QID) | INTRAMUSCULAR | Status: DC | PRN

## 2024-01-24 MED ORDER — SODIUM CHLORIDE 0.9 % IV SOLN
2.0000 g | INTRAVENOUS | Status: DC
  Administered 2024-01-25 – 2024-01-26 (×2): 2 g via INTRAVENOUS
  Filled 2024-01-24 (×2): qty 20

## 2024-01-24 MED ORDER — OXYCODONE HCL 5 MG PO TABS
5.0000 mg | ORAL_TABLET | Freq: Four times a day (QID) | ORAL | Status: DC | PRN
  Administered 2024-01-24 – 2024-01-27 (×2): 5 mg via ORAL
  Filled 2024-01-24 (×2): qty 1

## 2024-01-24 MED ORDER — SODIUM CHLORIDE 0.9 % IV SOLN
1.0000 g | Freq: Once | INTRAVENOUS | Status: AC
  Administered 2024-01-24: 1 g via INTRAVENOUS
  Filled 2024-01-24: qty 10

## 2024-01-24 NOTE — ED Notes (Signed)
 Pt SP02 91% and RR increased at 30. Dyspnea at rest noted. PRN neb Tx given and pt placed on 2 L [place]. MD notified. PRN Oxycodone  for back pain upon inspiration and PRN Tylenol  given for temp of 100.5.

## 2024-01-24 NOTE — ED Notes (Signed)
 Order for blood cultures placed AFTER abx started. 2 sets collected and sent to lab. MD aware.

## 2024-01-24 NOTE — ED Notes (Signed)
 Pt ambulatory to bathroom

## 2024-01-24 NOTE — ED Provider Notes (Signed)
 " [place] EMERGENCY DEPARTMENT AT [facility] Provider Note   CSN: [id number] Arrival date & time: 01/24/24  1103     Patient presents with: Influenza   Dennis Patel is a 74 y.o. male.   74 year old male presents for evaluation of shortness of breath.  Was recently diagnosed with flu but states his symptoms have gotten worse.  He is developed wheezing and shortness of breath even with exertion.  States he did use to smoke but has no history of lung disease.  He admits to increased cough and fatigue as well.  Denies any other symptoms or concerns at this time.   Influenza Presenting symptoms: cough, fatigue and shortness of breath   Presenting symptoms: no fever, no sore throat and no vomiting   Associated symptoms: no chills and no ear pain        Prior to Admission medications  Medication Sig Start Date End Date Taking? Authorizing Provider  abiraterone  acetate (ZYTIGA ) 250 MG tablet Take 4 tablets (1,000 mg total) by mouth daily. Take on an empty stomach 1 hour before or 2 hours after a meal 06/23/23   Dorsey, John T IV, MD  acetaminophen  (TYLENOL ) 500 MG tablet Take 1,000 mg by mouth at bedtime.    [provider]  amLODipine  (NORVASC ) 10 MG tablet Take 1 tablet (10 mg total) by mouth daily. 07/18/17   Mikhail, Maryann, DO  aspirin  EC 81 MG tablet Take 81 mg by mouth daily. Swallow whole.    [provider]  atorvastatin  (LIPITOR) 10 MG tablet TAKE 1 TABLET BY MOUTH EVERY DAY 08/04/21   Sheree Penne Bruckner, MD  calcium -vitamin D (OSCAL WITH D) 250-125 MG-UNIT tablet Take 1 tablet by mouth in the morning.    [provider]  clopidogrel  (PLAVIX ) 75 MG tablet Take 1 tablet (75 mg total) by mouth daily. 07/29/22   Eliza Bruckner RAMAN, MD  famotidine  (PEPCID ) 10 MG tablet Take 10 mg by mouth in the morning.    [provider]  gabapentin  (NEURONTIN ) 300 MG capsule Take 1 capsule (300 mg total) by mouth 2 (two) times daily. 08/15/23    Federico Norleen ONEIDA MADISON, MD  hydrochlorothiazide  (HYDRODIURIL ) 12.5 MG tablet Take 12.5 mg by mouth every morning. 11/06/18   [provider]  Multiple Vitamins-Minerals (ONE DAILY MULTIVITAMIN MEN PO) Take 1 tablet by mouth in the morning.    [provider]  naproxen sodium (ALEVE) 220 MG tablet Take 220 mg by mouth in the morning.    [provider]  predniSONE  (DELTASONE ) 5 MG tablet TAKE 1 TABLET BY MOUTH EVERY DAY WITH BREAKFAST 01/21/24   Dorsey, John T IV, MD  traZODone  (DESYREL ) 50 MG tablet TAKE 1 TABLET (50 MG TOTAL) BY MOUTH AT BEDTIME AS NEEDED. FOR SLEEP 10/15/23   Dorsey, John T IV, MD    Allergies: Patient has no known allergies.    Review of Systems  Constitutional:  Positive for fatigue. Negative for chills and fever.  HENT:  Negative for ear pain and sore throat.   Eyes:  Negative for pain and visual disturbance.  Respiratory:  Positive for cough and shortness of breath.   Cardiovascular:  Negative for chest pain and palpitations.  Gastrointestinal:  Negative for abdominal pain and vomiting.  Genitourinary:  Negative for dysuria and hematuria.  Musculoskeletal:  Negative for arthralgias and back pain.  Skin:  Negative for color change and rash.  Neurological:  Negative for seizures and syncope.  All other systems  reviewed and are negative.   Updated Vital Signs BP 133/66   Pulse 67   Temp (!) 100.5 F (38.1 C) (Oral)   Resp 17   SpO2 93%   Physical Exam Vitals and nursing note reviewed.  Constitutional:      General: He is not in acute distress.    Appearance: Normal appearance. He is well-developed. He is ill-appearing.  HENT:     Head: Normocephalic and atraumatic.  Eyes:     Conjunctiva/sclera: Conjunctivae normal.  Cardiovascular:     Rate and Rhythm: Normal rate and regular rhythm.     Heart sounds: No murmur heard. Pulmonary:     Effort: Pulmonary effort is normal. No respiratory distress.     Breath sounds: Wheezing present.      Comments: tachypneic Abdominal:     Palpations: Abdomen is soft.     Tenderness: There is no abdominal tenderness.  Musculoskeletal:        General: No swelling.     Cervical back: Neck supple.  Skin:    General: Skin is warm and dry.     Capillary Refill: Capillary refill takes less than 2 seconds.  Neurological:     General: No focal deficit present.     Mental Status: He is alert.  Psychiatric:        Mood and Affect: Mood normal.     (all labs ordered are listed, but only abnormal results are displayed) Labs Reviewed  BASIC METABOLIC PANEL WITH GFR - Abnormal; Notable for the following components:      Result Value   Glucose, Bld 105 (*)    All other components within normal limits  CBC - Abnormal; Notable for the following components:   WBC 22.0 (*)    All other components within normal limits  CULTURE, BLOOD (ROUTINE X 2)  CULTURE, BLOOD (ROUTINE X 2)  PRO BRAIN NATRIURETIC PEPTIDE  HIV ANTIBODY (ROUTINE TESTING W REFLEX)  BASIC METABOLIC PANEL WITH GFR  CBC  TROPONIN T, HIGH SENSITIVITY    EKG: EKG Interpretation Date/Time:  Wednesday January 24 2024 12:34:48 EST Ventricular Rate:  63 PR Interval:  145 QRS Duration:  108 QT Interval:  392 QTC Calculation: 402 R Axis:   39  Text Interpretation: Sinus rhythm Compared with prior EKG from 03/17/2017 Confirmed by Gennaro Bouchard ([id number]) on 01/24/2024 4:23:14 PM  Radiology: CT Angio Chest Pulmonary Embolism (PE) W or WO Contrast Result Date: 01/24/2024 EXAM: CTA of the Chest with contrast for PE 01/24/2024 05:48:18 PM TECHNIQUE: CTA of the chest was performed without and with the administration of 75 mL of iohexol  (OMNIPAQUE ) 350 MG/ML intravenous contrast. Multiplanar reformatted images are provided for review. MIP images are provided for review. Automated exposure control, iterative reconstruction, and/or weight based adjustment of the mA/kV was utilized to reduce the radiation dose to as low as reasonably  achievable. COMPARISON: PET CT 09/22/2023. CLINICAL HISTORY: sob, wheezing, new FINDINGS: PULMONARY ARTERIES: Pulmonary arteries are adequately opacified for evaluation. No pulmonary embolism. Main pulmonary artery is normal in caliber. MEDIASTINUM: The heart is mildly enlarged. The pericardium demonstrates no acute abnormality. There is no acute abnormality of the thoracic aorta. LYMPH NODES: No mediastinal, hilar or axillary lymphadenopathy. LUNGS AND PLEURA: Nodular density is seen in the region of the right lower lobe medially abutting the pleura measuring 1.8 x 2.0 cm on image 5/59. There is a triangular shape focal area of dense airspace consolidation without air bronchograms in the right lower lobe abutting the right hilum.  There are additional patchy peripheral interstitial and ground glass opacities throughout both lungs more prominent in the mid and lower lungs. No pleural effusion or pneumothorax. UPPER ABDOMEN: There is a 2.8 cm focal area of hypodensity measuring 22 Hounsfield units in the left kidney which may represent a cyst or complex cyst. SOFT TISSUES AND BONES: No acute bone or soft tissue abnormality. IMPRESSION: 1. No evidence of pulmonary embolism. 2. Bilateral patchy interstitial and ground-glass opacities AND focal right lower lobe consolidation, favored infectious/inflammatory. 3. Right lower lobe pleural-based solid nodular density measuring up to 2.0 cm; consider non-contrast chest CT at 3 months, PET/CT, or tissue sampling as per Fleischner Society Guidelines. 4. Stable 2.8 cm left renal hypodense lesion; likely cyst or complex cyst. Electronically signed by: Greig Pique MD MD 01/24/2024 06:11 PM EST RP Workstation: [id number]   DG Chest 2 View Result Date: 01/24/2024 CLINICAL DATA:  Shortness of breath EXAM: DG CHEST 2V COMPARISON:  January 01, 2022 FINDINGS: The heart size and mediastinal contours are within normal limits. Stable reticular densities are noted throughout both lungs  most consistent with scarring or fibrosis. No definite acute abnormality is noted. The visualized skeletal structures are unremarkable. IMPRESSION: Stable bilateral lung opacities most consistent with scarring or fibrosis. Electronically Signed   By: Lynwood Landy Raddle M.D.   On: 01/24/2024 12:16     Procedures   Medications Ordered in the ED  acetaminophen  (TYLENOL ) tablet 650 mg (650 mg Oral Given 01/24/24 2014)  oxyCODONE  (Oxy IR/ROXICODONE ) immediate release tablet 5 mg (5 mg Oral Given 01/24/24 2025)  oseltamivir  (TAMIFLU ) capsule 75 mg (has no administration in time range)  enoxaparin  (LOVENOX ) injection 40 mg (40 mg Subcutaneous Given 01/24/24 2015)  aspirin  EC tablet 81 mg (has no administration in time range)  atorvastatin  (LIPITOR) tablet 10 mg (has no administration in time range)  clopidogrel  (PLAVIX ) tablet 75 mg (has no administration in time range)  gabapentin  (NEURONTIN ) capsule 300 mg (has no administration in time range)  predniSONE  (DELTASONE ) tablet 40 mg (40 mg Oral Given 01/24/24 2014)  0.9 %  sodium chloride  infusion ( Intravenous New Bag/Given 01/24/24 2008)  ondansetron  (ZOFRAN ) tablet 4 mg (has no administration in time range)    Or  ondansetron  (ZOFRAN ) injection 4 mg (has no administration in time range)  azithromycin  (ZITHROMAX ) tablet 500 mg (has no administration in time range)  cefTRIAXone  (ROCEPHIN ) 2 g in sodium chloride  0.9 % 100 mL IVPB (has no administration in time range)  ipratropium-albuterol  (DUONEB) 0.5-2.5 (3) MG/3ML nebulizer solution 3 mL (3 mLs Nebulization Given 01/24/24 2025)  ipratropium-albuterol  (DUONEB) 0.5-2.5 (3) MG/3ML nebulizer solution 3 mL (3 mLs Nebulization Given 01/24/24 1621)  iohexol  (OMNIPAQUE ) 350 MG/ML injection 75 mL (75 mLs Intravenous Contrast Given 01/24/24 1739)  cefTRIAXone  (ROCEPHIN ) 1 g in sodium chloride  0.9 % 100 mL IVPB (0 g Intravenous Stopped 01/24/24 1926)  azithromycin  (ZITHROMAX ) tablet 500 mg (500 mg Oral Given 01/24/24 1852)                                     Medical Decision Making Cardiac monitor interpretation: Sinus rhythm, no ectopy  Patient here for shortness of breath.  Has wheezing on exam.  No history of COPD.  Is also very tachypneic.  Better after a DuoNeb.  Found to have bilateral pneumonia on imaging.  He does have a leukocytosis but labs are otherwise fairly stable.  Not hypoxic at  this time.  Started on broad-spectrum antibiotics for pneumonia and will be admitted for further workup and management.  Patient is agreeable with the plan.  I spoke with the hospitalist regarding patient's case and they will admit the patient for further workup and management.  Problems Addressed: Pneumonia of both lungs due to infectious organism, unspecified part of lung: acute illness or injury that poses a threat to life or bodily functions Wheezing: acute illness or injury  Amount and/or Complexity of Data Reviewed External Data Reviewed: notes.    Details: Prior outpatient records reviewed and patient with recent diagnosis of influenza about a week ago Labs: ordered. Decision-making details documented in ED Course.    Details: Ordered And reviewed by me and the patient with a leukocytosis Radiology: ordered and independent interpretation performed. Decision-making details documented in ED Course.    Details: Ordered and interpreted by me independently of radiology Chest x-ray: Shows no acute abnormality CT PE chest: Shows no PE but does show evidence of bilateral pneumonia ECG/medicine tests: ordered and independent interpretation performed. Decision-making details documented in ED Course.    Details: Ordered and interpreted by me in the absence of cardiology and shows sinus rhythm, no STEMI, or significant change when compared to prior EKG  Risk OTC drugs. Prescription drug management. Drug therapy requiring intensive monitoring for toxicity. Decision regarding hospitalization.    Final diagnoses:   Pneumonia of both lungs due to infectious organism, unspecified part of lung  Wheezing    ED Discharge Orders     None          Gennaro Duwaine CROME, DO 01/24/24 2036  "

## 2024-01-24 NOTE — H&P (Addendum)
 " [facility] History and Physical  Dennis Patel FMW:[id number] DOB: 04-17-1950 DOA: 01/24/2024   PCP: Auston Opal, DO  Specialists: Dr. Federico is his oncologist  Chief Complaint: Shortness of breath, cough, back pain  HPI: Dennis Patel is a 74 y.o. male with a past medical history of metastatic prostate cancer under the care of Dr. Federico, essential hypertension, peripheral artery disease, who was in his usual state of health till about a week ago when he started developing cough.  Had some fever.  Felt short of breath.  He went to see his primary care physician's office after a few days.  He tested positive for influenza but since he was outside the window he was not treated with Tamiflu .  Patient symptoms continue to worsen.  He felt more short of breath today decided to come to the emergency department.  His initial oxygen saturations on room air were 88%.  He was noted to be wheezing.  He was given a nebulizer treatment.  On my evaluation patient still is short of breath.  He is has a cough which is mostly dry.  He also complains of pain in his upper back area which has been present for a week or so.  He does not know the reason for this.  He denies any weakness in his legs or numbness in his legs.  Denies any urinary incontinence.  He still feels quite fatigued.  Denies any chest pain.  No nausea vomiting recently.  In the emergency department he was noted to have leukocytosis.  CT angiogram did not show any PE but did show evidence for pneumonia.  He will be hospitalized for further management.  Home Medications: This list is not reconciled yet Prior to Admission medications  Medication Sig Start Date End Date Taking? Authorizing Provider  abiraterone  acetate (ZYTIGA ) 250 MG tablet Take 4 tablets (1,000 mg total) by mouth daily. Take on an empty stomach 1 hour before or 2 hours after a meal 06/23/23   Dorsey, John T IV, MD  acetaminophen  (TYLENOL ) 500 MG tablet Take 1,000 mg by  mouth at bedtime.    [provider]  amLODipine  (NORVASC ) 10 MG tablet Take 1 tablet (10 mg total) by mouth daily. 07/18/17   Mikhail, Maryann, DO  aspirin  EC 81 MG tablet Take 81 mg by mouth daily. Swallow whole.    [provider]  atorvastatin  (LIPITOR) 10 MG tablet TAKE 1 TABLET BY MOUTH EVERY DAY 08/04/21   Sheree Penne Bruckner, MD  calcium -vitamin D (OSCAL WITH D) 250-125 MG-UNIT tablet Take 1 tablet by mouth in the morning.    [provider]  clopidogrel  (PLAVIX ) 75 MG tablet Take 1 tablet (75 mg total) by mouth daily. 07/29/22   Eliza Bruckner RAMAN, MD  famotidine  (PEPCID ) 10 MG tablet Take 10 mg by mouth in the morning.    [provider]  gabapentin  (NEURONTIN ) 300 MG capsule Take 1 capsule (300 mg total) by mouth 2 (two) times daily. 08/15/23   Federico Norleen ONEIDA MADISON, MD  hydrochlorothiazide  (HYDRODIURIL ) 12.5 MG tablet Take 12.5 mg by mouth every morning. 11/06/18   [provider]  Multiple Vitamins-Minerals (ONE DAILY MULTIVITAMIN MEN PO) Take 1 tablet by mouth in the morning.    [provider]  naproxen sodium (ALEVE) 220 MG tablet Take 220 mg by mouth in the morning.    [provider]  predniSONE  (DELTASONE ) 5 MG tablet TAKE 1 TABLET BY MOUTH EVERY DAY WITH BREAKFAST 01/21/24  Federico Norleen ONEIDA MADISON, MD  traZODone  (DESYREL ) 50 MG tablet TAKE 1 TABLET (50 MG TOTAL) BY MOUTH AT BEDTIME AS NEEDED. FOR SLEEP 10/15/23   Dorsey, John T IV, MD    Allergies: Allergies[1]  Past Medical History: Past Medical History:  Diagnosis Date   Arthritis    right pinky finger   Atherosclerosis    BPH (benign prostatic hyperplasia)    Cancer (HCC)    Claudication    Elevated PSA    Family history of adverse reaction to anesthesia    ' My Brother had an allergic reaction    History of kidney stones    Hypertension    Peripheral arterial disease    Peripheral artery disease    Peripheral vascular disease    Pre-diabetes     prostate ca with bone mets dx'd 11/2017   Pure hypercholesterolemia    Skin cancer    Multiple    Past Surgical History:  Procedure Laterality Date   ABDOMINAL ANGIOGRAM N/A 03/17/2011   Procedure: ABDOMINAL ANGIOGRAM;  Surgeon: Candyce GORMAN Reek, MD;  Location: [facility] CATH LAB;  Service: Cardiovascular;  Laterality: N/A;   ABDOMINAL AORTOGRAM W/LOWER EXTREMITY N/A 03/17/2017   Procedure: ABDOMINAL AORTOGRAM W/LOWER EXTREMITY;  Surgeon: Eliza Lonni GORMAN, MD;  Location: [facility] INVASIVE CV LAB;  Service: Cardiovascular;  Laterality: N/A;   ABDOMINAL AORTOGRAM W/LOWER EXTREMITY N/A 09/08/2017   Procedure: ABDOMINAL AORTOGRAM W/LOWER EXTREMITY;  Surgeon: Eliza Lonni GORMAN, MD;  Location: [facility] INVASIVE CV LAB;  Service: Cardiovascular;  Laterality: N/A;   ABDOMINAL AORTOGRAM W/LOWER EXTREMITY N/A 02/13/2023   Procedure: ABDOMINAL AORTOGRAM W/LOWER EXTREMITY;  Surgeon: Pearline Norman GORMAN, MD;  Location: [facility] INVASIVE CV LAB;  Service: Cardiovascular;  Laterality: N/A;   APPLICATION OF WOUND VAC Right 03/16/2023   Procedure: APPLICATION OF WOUND VAC - PREVENA;  Surgeon: Pearline Norman GORMAN, MD;  Location: [facility] OR;  Service: Vascular;  Laterality: Right;   COLONOSCOPY  2022   ENDARTERECTOMY FEMORAL Right 03/16/2023   Procedure: ENDARTERECTOMY RIGHT COMMON FEMORAL;  Surgeon: Pearline Norman GORMAN, MD;  Location: [facility] OR;  Service: Vascular;  Laterality: Right;   ILIAC ARTERY STENT     INSERTION OF ILIAC STENT Right 03/16/2023   Procedure: INSERTION OF RIGHT COMMON ILIAC ARTERY AND RIGHT COMMON FEMORAL ARTERY STENT;  Surgeon: Pearline Norman GORMAN, MD;  Location: [facility] OR;  Service: Vascular;  Laterality: Right;   IR FLUORO GUIDE CV LINE RIGHT  12/07/2021   IR IMAGING GUIDED PORT INSERTION  01/30/2018   IR REMOVAL TUN ACCESS W/ PORT W/O FL MOD SED  12/07/2021   LOWER EXTREMITY ANGIOGRAM N/A 03/17/2011   Procedure: LOWER EXTREMITY ANGIOGRAM;  Surgeon: Candyce GORMAN Reek, MD;  Location: [facility] CATH LAB;  Service:  Cardiovascular;  Laterality: N/A;   PATCH ANGIOPLASTY Right 03/16/2023   Procedure: PATCH ANGIOPLASTY RIGHT FEMORAL ARTERY USING XENOSURE BIOLOGIC PATCH;  Surgeon: Pearline Norman GORMAN, MD;  Location: [facility] OR;  Service: Vascular;  Laterality: Right;   PERIPHERAL VASCULAR INTERVENTION Left 09/08/2017   Procedure: PERIPHERAL VASCULAR INTERVENTION;  Surgeon: Eliza Lonni GORMAN, MD;  Location: [facility] INVASIVE CV LAB;  Service: Cardiovascular;  Laterality: Left;  Common iliac    Social History: Lives by himself.  Smokes cigars with last use about a week ago.  No alcohol use.  No recreational drug use.  Independent with his daily activities.  Family History:  Family History  Problem Relation Age of Onset   Ovarian cancer Mother 70   Diabetes Mother    CAD Father  Diabetes Sister    Lymphoma Brother 76   Skin cancer Brother        basal and squamous cell   Ovarian cancer Maternal Grandmother    CAD Son    Diabetes Son    Heart disease Son    Ovarian cancer Maternal Great-grandmother      Review of Systems - History obtained from the patient General ROS: positive for  - fatigue and malaise Psychological ROS: negative Ophthalmic ROS: negative ENT ROS: negative Allergy and Immunology ROS: negative Hematological and Lymphatic ROS: negative Endocrine ROS: negative Respiratory ROS: As in HPI Cardiovascular ROS: As in HPI Gastrointestinal ROS: no abdominal pain, change in bowel habits, or black or bloody stools Genito-Urinary ROS: no dysuria, trouble voiding, or hematuria Musculoskeletal ROS: negative Neurological ROS: no TIA or stroke symptoms Dermatological ROS: negative  Physical Examination  Vitals:   01/24/24 1123 01/24/24 1523 01/24/24 1847  BP: 126/62 137/70 133/66  Pulse: 66 66 67  Resp: 18 17 17   Temp: 99.1 F (37.3 C) 99.1 F (37.3 C) 99.6 F (37.6 C)  TempSrc: Oral Oral Oral  SpO2: 92% 94% 93%    BP 133/66   Pulse 67   Temp 99.6 F (37.6 C) (Oral)   Resp 17    SpO2 93%   General appearance: alert, cooperative, appears stated age, and no distress Head: Normocephalic, without obvious abnormality, atraumatic Eyes: conjunctivae/corneas clear. PERRL, EOM's intact. Throat: lips, mucosa, and tongue normal; teeth and gums normal Neck: no adenopathy, no carotid bruit, no JVD, supple, symmetrical, trachea midline, and thyroid not enlarged, symmetric, no tenderness/mass/nodules Back: He does have tenderness in the upper back around the T9-T10 area towards the right.  No obvious masses noted.  No erythema. Resp: Tachypneic at rest.  Coarse breath sounds with crackles bilaterally.  No wheezing or rhonchi appreciated currently. Cardio: regular rate and rhythm, S1, S2 normal, no murmur, click, rub or gallop GI: soft, non-tender; bowel sounds normal; no masses,  no organomegaly Extremities: extremities normal, atraumatic, no cyanosis or edema Pulses: 2+ and symmetric Skin: Skin color, texture, turgor normal. No rashes or lesions Lymph nodes: Cervical, supraclavicular, and axillary nodes normal. Neurologic: Alert and oriented x 3.  Cranial nerves II to XII intact.  Motor strength equal bilateral upper and lower extremities.   Labs on Admission: I have personally reviewed following labs and reports of imaging studies  CBC: Recent Labs  Lab 01/24/24 1229  WBC 22.0*  HGB 15.1  HCT 43.1  MCV 91.9  PLT 254   Basic Metabolic Panel: Recent Labs  Lab 01/24/24 1229  NA 138  K 4.1  CL 100  CO2 25  GLUCOSE 105*  BUN 20  CREATININE 1.06  CALCIUM  9.8   BNP (last 3 results) Recent Labs    01/24/24 1627  PROBNP 179.0      Radiological Exams on Admission: CT Angio Chest Pulmonary Embolism (PE) W or WO Contrast Result Date: 01/24/2024 EXAM: CTA of the Chest with contrast for PE 01/24/2024 05:48:18 PM TECHNIQUE: CTA of the chest was performed without and with the administration of 75 mL of iohexol  (OMNIPAQUE ) 350 MG/ML intravenous contrast. Multiplanar  reformatted images are provided for review. MIP images are provided for review. Automated exposure control, iterative reconstruction, and/or weight based adjustment of the mA/kV was utilized to reduce the radiation dose to as low as reasonably achievable. COMPARISON: PET CT 09/22/2023. CLINICAL HISTORY: sob, wheezing, new FINDINGS: PULMONARY ARTERIES: Pulmonary arteries are adequately opacified for evaluation. No pulmonary embolism.  Main pulmonary artery is normal in caliber. MEDIASTINUM: The heart is mildly enlarged. The pericardium demonstrates no acute abnormality. There is no acute abnormality of the thoracic aorta. LYMPH NODES: No mediastinal, hilar or axillary lymphadenopathy. LUNGS AND PLEURA: Nodular density is seen in the region of the right lower lobe medially abutting the pleura measuring 1.8 x 2.0 cm on image 5/59. There is a triangular shape focal area of dense airspace consolidation without air bronchograms in the right lower lobe abutting the right hilum. There are additional patchy peripheral interstitial and ground glass opacities throughout both lungs more prominent in the mid and lower lungs. No pleural effusion or pneumothorax. UPPER ABDOMEN: There is a 2.8 cm focal area of hypodensity measuring 22 Hounsfield units in the left kidney which may represent a cyst or complex cyst. SOFT TISSUES AND BONES: No acute bone or soft tissue abnormality. IMPRESSION: 1. No evidence of pulmonary embolism. 2. Bilateral patchy interstitial and ground-glass opacities AND focal right lower lobe consolidation, favored infectious/inflammatory. 3. Right lower lobe pleural-based solid nodular density measuring up to 2.0 cm; consider non-contrast chest CT at 3 months, PET/CT, or tissue sampling as per Fleischner Society Guidelines. 4. Stable 2.8 cm left renal hypodense lesion; likely cyst or complex cyst. Electronically signed by: Greig Pique MD MD 01/24/2024 06:11 PM EST RP Workstation: [id number]   DG Chest 2  View Result Date: 01/24/2024 CLINICAL DATA:  Shortness of breath EXAM: DG CHEST 2V COMPARISON:  January 01, 2022 FINDINGS: The heart size and mediastinal contours are within normal limits. Stable reticular densities are noted throughout both lungs most consistent with scarring or fibrosis. No definite acute abnormality is noted. The visualized skeletal structures are unremarkable. IMPRESSION: Stable bilateral lung opacities most consistent with scarring or fibrosis. Electronically Signed   By: Lynwood Landy Raddle M.D.   On: 01/24/2024 12:16    My interpretation of Electrocardiogram: Sinus rhythm in the 60s.  Normal axis.  Poor quality EKG with wavering baseline.  No obvious ST or T wave changes noted.   Problem List  Principal Problem:   Pneumonia Active Problems:   Peripheral vascular disease   Malignant neoplasm of prostate (HCC)   Influenza and pneumonia   Back pain   Assessment: This is 74 year old Caucasian male with metastatic prostate cancer and other history as mentioned earlier who comes in with shortness of breath and cough ongoing for about a week.  He tested positive for influenza on 1/2.  Was not treated with the Tamiflu  since he was outside the window.  CT angiogram shows bilateral patchy interstitial and ground glass opacities and foker right lower lobe consolidation.  He is noted to have leukocytosis which could be from steroids which were prescribed at his PCP visit.  Plan:  Pneumonia/influenza Patient is immunocompromised due to history of prostate cancer and being on Zytiga .  Leukocytosis could be due to higher dose of steroids that he was prescribed recently.  But could be also due to active infection.  We will for now treating with ceftriaxone  azithromycin  and will also give him a 5-day course of Tamiflu  due to his immunocompromise status. He seems to be maintaining his oxygen saturations though he was initially hypoxic.  Not requiring any oxygen currently.  Metastatic  prostate cancer On Zytiga  and prednisone  daily.  Zytiga  will be held due to active infection.  Will give him a higher dose of prednisone  for now.  Can be tapered down to his usual dose of 5 mg daily.  Dr. Federico will  be notified of this admission.  Back pain He is experiencing upper back pain around his T9-T10 vertebrae.  PET scan from last year reviewed.  He was noted to have possible metastatic lesion in the T11 area.  Proceed with MRI of the thoracic spine.  Pain control.  Tobacco abuse He smokes cigars.  He was counseled.  Right lower lobe pulmonary nodule Noted on CT angiogram.  This measured about 2 cm.  Will need close monitoring.  History of peripheral artery disease Continue with his aspirin  Plavix .  He has stents in his lower extremities.  Essential hypertension Monitor blood pressures closely.  Await medication reconciliation.  Medication reconciliation has not been completed yet.  DVT Prophylaxis: Lovenox  Code Status: Full code Family Communication: Discussed with patient Disposition: Hopefully return home when improved Consults called: Medical oncology added to epic chart Admission Status: Status is: Observation The patient remains OBS appropriate and will d/c before 2 midnights.    Severity of Illness: The appropriate patient status for this patient is OBSERVATION. Observation status is judged to be reasonable and necessary in order to provide the required intensity of service to ensure the patient's safety. The patient's presenting symptoms, physical exam findings, and initial radiographic and laboratory data in the context of their medical condition is felt to place them at decreased risk for further clinical deterioration. Furthermore, it is anticipated that the patient will be medically stable for discharge from the hospital within 2 midnights of admission.    Further management decisions will depend on results of further testing and patient's response to  treatment.   Hyrum Shaneyfelt  [facility] Pager on [email or web address]  01/24/2024, 7:25 PM     [1] No Known Allergies  "

## 2024-01-24 NOTE — ED Provider Triage Note (Signed)
 Emergency Medicine Provider Triage Evaluation Note  Dennis Patel , a 74 y.o. male  was evaluated in triage.  Pt complains of flulike symptoms.  Was diagnosed with the flu on Friday, was given albuterol  inhaler to help alleviate his symptoms but reports no symptomatic improvement.  Endorses ongoing nonproductive cough as well as continued fevers at home, no history of COPD or asthma.  No chest pain but does report some pain in the right lateral mid back as well as of the left neck.  Review of Systems  Positive: As above Negative: As above  Physical Exam  BP 126/62 (BP Location: Left Arm)   Pulse 66   Temp 99.1 F (37.3 C) (Oral)   Resp 18   SpO2 92%  Gen:   Awake, no distress   Resp:  Normal effort, speaking in full/clear sentences without respiratory distress, coarse breath sounds throughout MSK:   Moves extremities without difficulty  Other:    Medical Decision Making  Medically screening exam initiated at 12:26 PM.  Appropriate orders placed.  Alm JAYSON Silvius was informed that the remainder of the evaluation will be completed by another provider, this initial triage assessment does not replace that evaluation, and the importance of remaining in the ED until their evaluation is complete.     Glendia Rocky SAILOR, [place] 01/24/24 1227

## 2024-01-24 NOTE — ED Triage Notes (Signed)
 Last Friday the patient was seen by his MD and tested positive for flu. He was given an albuterol  inhaler and prednisone . He reports the inhaler did not help and symptoms have persisted now for about 11 days. Reports chills, O2 sat of 88% at home last night, fever of 100.8 today in which tylenol  alleviated, right mid back pain left neck pain and shortness of breath.

## 2024-01-25 ENCOUNTER — Observation Stay (HOSPITAL_COMMUNITY)

## 2024-01-25 ENCOUNTER — Encounter (HOSPITAL_COMMUNITY): Payer: Self-pay | Admitting: Internal Medicine

## 2024-01-25 DIAGNOSIS — C61 Malignant neoplasm of prostate: Secondary | ICD-10-CM | POA: Diagnosis present

## 2024-01-25 DIAGNOSIS — E7800 Pure hypercholesterolemia, unspecified: Secondary | ICD-10-CM | POA: Diagnosis present

## 2024-01-25 DIAGNOSIS — J1108 Influenza due to unidentified influenza virus with specified pneumonia: Secondary | ICD-10-CM | POA: Diagnosis present

## 2024-01-25 DIAGNOSIS — J189 Pneumonia, unspecified organism: Secondary | ICD-10-CM | POA: Diagnosis not present

## 2024-01-25 DIAGNOSIS — R7303 Prediabetes: Secondary | ICD-10-CM | POA: Diagnosis present

## 2024-01-25 DIAGNOSIS — Z7982 Long term (current) use of aspirin: Secondary | ICD-10-CM | POA: Diagnosis not present

## 2024-01-25 DIAGNOSIS — J9601 Acute respiratory failure with hypoxia: Secondary | ICD-10-CM | POA: Diagnosis present

## 2024-01-25 DIAGNOSIS — Z808 Family history of malignant neoplasm of other organs or systems: Secondary | ICD-10-CM | POA: Diagnosis not present

## 2024-01-25 DIAGNOSIS — Z8249 Family history of ischemic heart disease and other diseases of the circulatory system: Secondary | ICD-10-CM | POA: Diagnosis not present

## 2024-01-25 DIAGNOSIS — Z7902 Long term (current) use of antithrombotics/antiplatelets: Secondary | ICD-10-CM | POA: Diagnosis not present

## 2024-01-25 DIAGNOSIS — D849 Immunodeficiency, unspecified: Secondary | ICD-10-CM | POA: Diagnosis present

## 2024-01-25 DIAGNOSIS — Z7952 Long term (current) use of systemic steroids: Secondary | ICD-10-CM | POA: Diagnosis not present

## 2024-01-25 DIAGNOSIS — F17290 Nicotine dependence, other tobacco product, uncomplicated: Secondary | ICD-10-CM | POA: Diagnosis present

## 2024-01-25 DIAGNOSIS — R911 Solitary pulmonary nodule: Secondary | ICD-10-CM | POA: Diagnosis present

## 2024-01-25 DIAGNOSIS — M19041 Primary osteoarthritis, right hand: Secondary | ICD-10-CM | POA: Diagnosis present

## 2024-01-25 DIAGNOSIS — N400 Enlarged prostate without lower urinary tract symptoms: Secondary | ICD-10-CM | POA: Diagnosis present

## 2024-01-25 DIAGNOSIS — Z79899 Other long term (current) drug therapy: Secondary | ICD-10-CM | POA: Diagnosis not present

## 2024-01-25 DIAGNOSIS — Z85828 Personal history of other malignant neoplasm of skin: Secondary | ICD-10-CM | POA: Diagnosis not present

## 2024-01-25 DIAGNOSIS — C7951 Secondary malignant neoplasm of bone: Secondary | ICD-10-CM | POA: Diagnosis present

## 2024-01-25 DIAGNOSIS — J159 Unspecified bacterial pneumonia: Secondary | ICD-10-CM | POA: Diagnosis present

## 2024-01-25 DIAGNOSIS — Z791 Long term (current) use of non-steroidal anti-inflammatories (NSAID): Secondary | ICD-10-CM | POA: Diagnosis not present

## 2024-01-25 DIAGNOSIS — I10 Essential (primary) hypertension: Secondary | ICD-10-CM | POA: Diagnosis present

## 2024-01-25 DIAGNOSIS — Z87442 Personal history of urinary calculi: Secondary | ICD-10-CM | POA: Diagnosis not present

## 2024-01-25 DIAGNOSIS — Z833 Family history of diabetes mellitus: Secondary | ICD-10-CM | POA: Diagnosis not present

## 2024-01-25 DIAGNOSIS — I739 Peripheral vascular disease, unspecified: Secondary | ICD-10-CM | POA: Diagnosis present

## 2024-01-25 DIAGNOSIS — R0602 Shortness of breath: Secondary | ICD-10-CM | POA: Diagnosis present

## 2024-01-25 LAB — CBC
HCT: 40.4 % (ref 39.0–52.0)
Hemoglobin: 14.3 g/dL (ref 13.0–17.0)
MCH: 32.1 pg (ref 26.0–34.0)
MCHC: 35.4 g/dL (ref 30.0–36.0)
MCV: 90.8 fL (ref 80.0–100.0)
Platelets: 225 K/uL (ref 150–400)
RBC: 4.45 MIL/uL (ref 4.22–5.81)
RDW: 12.2 % (ref 11.5–15.5)
WBC: 19.3 K/uL — ABNORMAL HIGH (ref 4.0–10.5)
nRBC: 0 % (ref 0.0–0.2)

## 2024-01-25 LAB — HEMOGLOBIN A1C
Hgb A1c MFr Bld: 5.9 % — ABNORMAL HIGH (ref 4.8–5.6)
Mean Plasma Glucose: 122.63 mg/dL

## 2024-01-25 LAB — BASIC METABOLIC PANEL WITH GFR
Anion gap: 14 (ref 5–15)
BUN: 19 mg/dL (ref 8–23)
CO2: 23 mmol/L (ref 22–32)
Calcium: 9 mg/dL (ref 8.9–10.3)
Chloride: 99 mmol/L (ref 98–111)
Creatinine, Ser: 1.12 mg/dL (ref 0.61–1.24)
GFR, Estimated: >60 mL/min
Glucose, Bld: 218 mg/dL — ABNORMAL HIGH (ref 70–99)
Potassium: 4 mmol/L (ref 3.5–5.1)
Sodium: 137 mmol/L (ref 135–145)

## 2024-01-25 LAB — HIV ANTIBODY (ROUTINE TESTING W REFLEX): HIV Screen 4th Generation wRfx: NONREACTIVE

## 2024-01-25 MED ORDER — ALUM & MAG HYDROXIDE-SIMETH 200-200-20 MG/5ML PO SUSP
30.0000 mL | ORAL | Status: DC | PRN
  Administered 2024-01-25: 30 mL via ORAL
  Filled 2024-01-25: qty 30

## 2024-01-25 MED ORDER — GUAIFENESIN-DM 100-10 MG/5ML PO SYRP
5.0000 mL | ORAL_SOLUTION | ORAL | Status: DC | PRN
  Administered 2024-01-25 – 2024-01-27 (×2): 5 mL via ORAL
  Filled 2024-01-25 (×2): qty 5

## 2024-01-25 MED ORDER — TRAZODONE HCL 50 MG PO TABS
50.0000 mg | ORAL_TABLET | Freq: Every evening | ORAL | Status: DC | PRN
  Administered 2024-01-25 – 2024-01-26 (×2): 50 mg via ORAL
  Filled 2024-01-25 (×2): qty 1

## 2024-01-25 MED ORDER — AMLODIPINE BESYLATE 10 MG PO TABS
10.0000 mg | ORAL_TABLET | Freq: Every day | ORAL | Status: DC
  Administered 2024-01-25 – 2024-01-27 (×3): 10 mg via ORAL
  Filled 2024-01-25 (×3): qty 1

## 2024-01-25 NOTE — ED Notes (Signed)
 Patient back in room from MRI.

## 2024-01-25 NOTE — Progress Notes (Signed)
 " PROGRESS NOTE    Dennis Patel  FMW:[id number] DOB: [birth year] DOA: 01/24/2024 PCP: Auston Opal, DO  Subjective: Patient reports feeling much better since yesterday.  He reports his breathing much better, still has a cough.  His back pain also feels better today.   Hospital Course: 74 year old male with PMH of metastatic breast cancer, HTN, PAD who presented to the ED with shortness of breath.  A few days ago he went to see his PCP, at the office he was found to be positive for influenza but he was outside the window for Tamiflu  treatment and was not given, but his symptoms continue to worsen thereafter.  On arrival O2 sats were 88% on room air.  He also complained of pain in his upper back.  CTA was negative for PE which showed evidence for pneumonia, and he was hospitalized for IV antibiotics and further management.   Assessment and Plan:  Acute hypoxic respiratory failure due to influenza and superimposed bacterial pneumonia - tested positive fro FluA on 1/2 (results in CareEverywhere), and CTA chest showed focal right lower lobe consolidation, favored infectious/inflammatory - continue ceftriaxone  and azithromycin  - continue Tamiflu , given that he is immunocompromised while on active prostate cancer therapy - on 3.5L [place], wean as able - nebs as needed  Metastatic prostate cancer - holding Zytiga  due to active infection - continue prednisone , currently on a higher dose and taper down by 20 mg every 3 days to home dose of 5 mg   Back pain - PET from last year showed possible metastatic lesion in the T11 area.  Pending MRI of the thoracic spine  Right lower lobe pulmonary nodule Noted on CT angiogram.  This measured about 2 cm.  Will need close monitoring.   History of peripheral artery disease Continue with his aspirin  Plavix .  He has stents in his lower extremities.   Essential hypertension - resumed amlodipine , continue to hold hydrochlorothiazide  fro now     DVT  prophylaxis: enoxaparin  (LOVENOX ) injection 40 mg Start: 01/24/24 2000    Code Status: Full Code Family Communication: Sister updated at bedside  Disposition Plan: TBD Reason for continuing need for hospitalization: New O2, IV antibiotics   Objective: Vitals:   01/25/24 0900 01/25/24 0930 01/25/24 0955 01/25/24 1229  BP: (!) 147/66 (!) 147/70  135/61  Pulse: (!) 53 (!) 56  (!) 57  Resp: (!) 22 (!) 27  20  Temp:   (!) 97.3 F (36.3 C) 97.9 F (36.6 C)  TempSrc:   Oral   SpO2: 97% 96%  98%   No intake or output data in the 24 hours ending 01/25/24 1335 There were no vitals filed for this visit.  Examination:  Physical Exam Vitals and nursing note reviewed.  Constitutional:      General: He is not in acute distress. Cardiovascular:     Rate and Rhythm: Normal rate.  Pulmonary:     Effort: No respiratory distress.     Breath sounds: No wheezing.  Abdominal:     General: There is no distension.     Tenderness: There is no abdominal tenderness.  Musculoskeletal:     Right lower leg: No edema.     Left lower leg: No edema.     Data Reviewed: I have personally reviewed following labs and imaging studies  CBC: Recent Labs  Lab 01/24/24 1229 01/25/24 0450  WBC 22.0* 19.3*  HGB 15.1 14.3  HCT 43.1 40.4  MCV 91.9 90.8  PLT 254 225  Basic Metabolic Panel: Recent Labs  Lab 01/24/24 1229 01/25/24 0450  NA 138 137  K 4.1 4.0  CL 100 99  CO2 25 23  GLUCOSE 105* 218*  BUN 20 19  CREATININE 1.06 1.12  CALCIUM  9.8 9.0   GFR: CrCl cannot be calculated (Unknown ideal weight.). Liver Function Tests: No results for input(s): AST, ALT, ALKPHOS, BILITOT, PROT, ALBUMIN in the last 168 hours. No results for input(s): LIPASE, AMYLASE in the last 168 hours. No results for input(s): AMMONIA in the last 168 hours. Coagulation Profile: No results for input(s): INR, PROTIME in the last 168 hours. Cardiac Enzymes: No results for input(s): CKTOTAL,  CKMB, CKMBINDEX, TROPONINI in the last 168 hours. ProBNP, BNP (last 5 results) Recent Labs    01/24/24 1627  PROBNP 179.0   HbA1C: No results for input(s): HGBA1C in the last 72 hours. CBG: No results for input(s): GLUCAP in the last 168 hours. Lipid Profile: No results for input(s): CHOL, HDL, LDLCALC, TRIG, CHOLHDL, LDLDIRECT in the last 72 hours. Thyroid Function Tests: No results for input(s): TSH, T4TOTAL, FREET4, T3FREE, THYROIDAB in the last 72 hours. Anemia Panel: No results for input(s): VITAMINB12, FOLATE, FERRITIN, TIBC, IRON, RETICCTPCT in the last 72 hours. Sepsis Labs: No results for input(s): PROCALCITON, LATICACIDVEN in the last 168 hours.  No results found for this or any previous visit (from the past 240 hours).   Radiology Studies: MR THORACIC SPINE WO CONTRAST Result Date: 01/25/2024 EXAM: MRI THORACIC SPINE WITHOUT INTRAVENOUS CONTRAST 01/25/2024 11:43:48 AM TECHNIQUE: Multiplanar multisequence MRI of the thoracic spine was performed without the administration of intravenous contrast. COMPARISON: CTA chest 01/24/2024, PET CT 09/22/2023. CLINICAL HISTORY: 74 year old male with prostate cancer and back pain. FINDINGS: Normal thoracic segmentation on CTA yesterday. BONES AND ALIGNMENT: Heterogeneous bone marrow signal in the visible cervical spine. No destructive osseous lesion or overt metastasis is evident. Normal thoracic segmentation on CTA yesterday. Intrinsic increased T1 signal throughout the T11 vertebral body. This suggests previous radiation treatment there. Heterogeneous bone marrow signal elsewhere throughout the thoracic spine. No destructive or suspicious thoracic vertebral lesion by non-contrast MRI. T2 superior endplate deformity with STIR hyperintensity (series 19 image 10). This most resembles chronic superior endplate Schmorl node on CT yesterday. Similar finding of the L1 superior endplate, and no marrow  edema there (series 18 image 9). No obvious posterior rib metastasis. Normal alignment. Normal vertebral body heights. SPINAL CORD: Thoracic spinal cord signal and morphology are within normal limits. Conus medullaris appears normal at T12-L1. SOFT TISSUES: Trace layering pleural fluid. Streaky abnormal lung parenchyma. This was better evaluated by CTA yesterday. DEGENERATIVE CHANGES: No age advanced thoracic spine degeneration. Capacious underlying thoracic spinal canal, although superimposed dorsal thoracic epidural lipomatosis from the levels T3 through T8 (series 18 image 12) but no thoracic spinal stenosis. IMPRESSION: 1. No convincing metastatic disease in the thoracic spine on non-contrast MRI. Intrinsic increased T1 signal in the T11 vertebra suggesting prior radiation treatment. 2. T2 superior endplate deformity with mild marrow edema but most resembles degenerative schmorl's node on CT yesterday, and similar chronic finding in the superior L1 vertebra. 3. No age advanced thoracic degeneration, no thoracic spinal stenosis. 4. Abnormal lungs, trace pleural effusion fluid, see CTA yesterday. Electronically signed by: Helayne Hurst MD MD 01/25/2024 12:18 PM EST RP Workstation: [id number]   CT Angio Chest Pulmonary Embolism (PE) W or WO Contrast Result Date: 01/24/2024 EXAM: CTA of the Chest with contrast for PE 01/24/2024 05:48:18 PM TECHNIQUE: CTA of the chest was  performed without and with the administration of 75 mL of iohexol  (OMNIPAQUE ) 350 MG/ML intravenous contrast. Multiplanar reformatted images are provided for review. MIP images are provided for review. Automated exposure control, iterative reconstruction, and/or weight based adjustment of the mA/kV was utilized to reduce the radiation dose to as low as reasonably achievable. COMPARISON: PET CT 09/22/2023. CLINICAL HISTORY: sob, wheezing, new FINDINGS: PULMONARY ARTERIES: Pulmonary arteries are adequately opacified for evaluation. No pulmonary  embolism. Main pulmonary artery is normal in caliber. MEDIASTINUM: The heart is mildly enlarged. The pericardium demonstrates no acute abnormality. There is no acute abnormality of the thoracic aorta. LYMPH NODES: No mediastinal, hilar or axillary lymphadenopathy. LUNGS AND PLEURA: Nodular density is seen in the region of the right lower lobe medially abutting the pleura measuring 1.8 x 2.0 cm on image 5/59. There is a triangular shape focal area of dense airspace consolidation without air bronchograms in the right lower lobe abutting the right hilum. There are additional patchy peripheral interstitial and ground glass opacities throughout both lungs more prominent in the mid and lower lungs. No pleural effusion or pneumothorax. UPPER ABDOMEN: There is a 2.8 cm focal area of hypodensity measuring 22 Hounsfield units in the left kidney which may represent a cyst or complex cyst. SOFT TISSUES AND BONES: No acute bone or soft tissue abnormality. IMPRESSION: 1. No evidence of pulmonary embolism. 2. Bilateral patchy interstitial and ground-glass opacities AND focal right lower lobe consolidation, favored infectious/inflammatory. 3. Right lower lobe pleural-based solid nodular density measuring up to 2.0 cm; consider non-contrast chest CT at 3 months, PET/CT, or tissue sampling as per Fleischner Society Guidelines. 4. Stable 2.8 cm left renal hypodense lesion; likely cyst or complex cyst. Electronically signed by: Greig Pique MD MD 01/24/2024 06:11 PM EST RP Workstation: [id number]   DG Chest 2 View Result Date: 01/24/2024 CLINICAL DATA:  Shortness of breath EXAM: DG CHEST 2V COMPARISON:  January 01, 2022 FINDINGS: The heart size and mediastinal contours are within normal limits. Stable reticular densities are noted throughout both lungs most consistent with scarring or fibrosis. No definite acute abnormality is noted. The visualized skeletal structures are unremarkable. IMPRESSION: Stable bilateral lung opacities  most consistent with scarring or fibrosis. Electronically Signed   By: Lynwood Landy Raddle M.D.   On: 01/24/2024 12:16    Scheduled Meds:  aspirin  EC  81 mg Oral Daily   atorvastatin   10 mg Oral Daily   azithromycin   500 mg Oral Daily   clopidogrel   75 mg Oral Daily   enoxaparin  (LOVENOX ) injection  40 mg Subcutaneous Q24H   gabapentin   300 mg Oral BID   oseltamivir   75 mg Oral BID   predniSONE   40 mg Oral BID WC   Continuous Infusions:  sodium chloride  75 mL/hr at 01/25/24 0954   cefTRIAXone  (ROCEPHIN )  IV       LOS: 0 days   Time spent: 40 minutes  Casimer Dare, MD  [facility]  01/25/2024, 1:35 PM   "

## 2024-01-25 NOTE — Plan of Care (Signed)
?  Problem: Education: ?Goal: Knowledge of General Education information will improve ?Description: Including pain rating scale, medication(s)/side effects and non-pharmacologic comfort measures ?Outcome: Progressing ?  ?Problem: Clinical Measurements: ?Goal: Ability to maintain clinical measurements within normal limits will improve ?Outcome: Progressing ?Goal: Will remain free from infection ?Outcome: Progressing ?Goal: Diagnostic test results will improve ?Outcome: Progressing ?Goal: Respiratory complications will improve ?Outcome: Progressing ?Goal: Cardiovascular complication will be avoided ?Outcome: Progressing ?  ?Problem: Nutrition: ?Goal: Adequate nutrition will be maintained ?Outcome: Progressing ?  ?Problem: Elimination: ?Goal: Will not experience complications related to bowel motility ?Outcome: Progressing ?Goal: Will not experience complications related to urinary retention ?Outcome: Progressing ?  ?Problem: Safety: ?Goal: Ability to remain free from injury will improve ?Outcome: Progressing ?  ?Problem: Skin Integrity: ?Goal: Risk for impaired skin integrity will decrease ?Outcome: Progressing ?  ?

## 2024-01-25 NOTE — ED Notes (Signed)
 Patient transported to MRI

## 2024-01-26 MED ORDER — PREDNISONE 5 MG PO TABS: 5.0000 mg | ORAL_TABLET | Freq: Every day | ORAL | Status: DC

## 2024-01-26 MED ORDER — PREDNISONE 20 MG PO TABS: 30.0000 mg | ORAL_TABLET | Freq: Every day | ORAL | Status: DC

## 2024-01-26 MED ORDER — PREDNISONE 10 MG PO TABS: 10.0000 mg | ORAL_TABLET | Freq: Every day | ORAL | Status: DC

## 2024-01-26 MED ORDER — PREDNISONE 20 MG PO TABS
40.0000 mg | ORAL_TABLET | Freq: Every day | ORAL | Status: DC
  Administered 2024-01-26: 40 mg via ORAL
  Filled 2024-01-26: qty 2

## 2024-01-26 MED ORDER — LIDOCAINE 5 % EX PTCH
1.0000 | MEDICATED_PATCH | CUTANEOUS | Status: DC
  Administered 2024-01-26: 1 via TRANSDERMAL
  Filled 2024-01-26: qty 1

## 2024-01-26 MED ORDER — PREDNISONE 20 MG PO TABS
40.0000 mg | ORAL_TABLET | Freq: Every day | ORAL | Status: DC
  Administered 2024-01-27: 40 mg via ORAL
  Filled 2024-01-26: qty 2

## 2024-01-26 MED ORDER — PREDNISONE 20 MG PO TABS: 20.0000 mg | ORAL_TABLET | Freq: Every day | ORAL | Status: DC

## 2024-01-26 NOTE — Progress Notes (Signed)
 " PROGRESS NOTE    Dennis Patel  FMW:[id number] DOB: June 24, 1950 DOA: 01/24/2024 PCP: Auston Opal, DO  Subjective: Patient reports that his breathing is improved but he still feels short of breath on exertion or when talking for prolonged period of time. Has cough but bringing up minimal sputum    Hospital Course: 74 year old male with PMH of metastatic breast cancer, HTN, PAD who presented to the ED with shortness of breath.  A few days ago he went to see his PCP, at the office he was found to be positive for influenza but he was outside the window for Tamiflu  treatment and was not given, but his symptoms continue to worsen thereafter.  On arrival O2 sats were 88% on room air.  He also complained of pain in his upper back.  CTA was negative for PE which showed evidence for pneumonia, and he was hospitalized for IV antibiotics and further management.    Assessment and Plan:  Acute hypoxic respiratory failure due to influenza and superimposed bacterial pneumonia - tested positive fro FluA on 1/2 (results in CareEverywhere), and CTA chest showed focal right lower lobe consolidation, favored infectious/inflammatory - continue ceftriaxone  and azithromycin  - continue Tamiflu , given that he is immunocompromised while on active prostate cancer therapy - nebs as needed - remains on 3.5L O2, wean as able. If unable to, will need to see if he qualifies for home O2   Metastatic prostate cancer - holding Zytiga  due to active infection - continue prednisone , currently on a higher dose (40 mg daily) and taper down to his home dose of 5 mg   Back pain - PET from last year showed possible metastatic lesion in the T11 area.   - MRI of the thoracic spine was negative for metastatic lesion   Right lower lobe pulmonary nodule Noted on CT angiogram.  This measured about 2 cm.  Will need close monitoring as outpatient   History of peripheral artery disease Continue with his aspirin  Plavix .  He has stents  in his lower extremities.   Essential hypertension - resumed amlodipine , continue to hold hydrochlorothiazide  for now     DVT prophylaxis: enoxaparin  (LOVENOX ) injection 40 mg Start: 01/24/24 2000    Code Status: Full Code Disposition Plan: Home Reason for continuing need for hospitalization: Oxygen needs  Objective: Vitals:   01/26/24 0032 01/26/24 0105 01/26/24 0405 01/26/24 0438  BP: (!) 162/69 120/62 (!) 164/62 131/63  Pulse: 65  (!) 57 (!) 54  Resp: 16  18   Temp: 97.8 F (36.6 C)  98.2 F (36.8 C)   TempSrc: Oral  Oral   SpO2: 100%  100%     Intake/Output Summary (Last 24 hours) at 01/26/2024 1248 Last data filed at 01/26/2024 0900 Gross per 24 hour  Intake 360 ml  Output --  Net 360 ml   There were no vitals filed for this visit.  Examination:  Physical Exam Vitals and nursing note reviewed.  Constitutional:      General: He is not in acute distress. Cardiovascular:     Rate and Rhythm: Normal rate.  Pulmonary:     Effort: No respiratory distress.     Breath sounds: Rhonchi present. No wheezing.  Abdominal:     General: There is no distension.  Musculoskeletal:     Right lower leg: No edema.     Left lower leg: No edema.     Data Reviewed: I have personally reviewed following labs and imaging studies  CBC: Recent Labs  Lab 01/24/24 1229 01/25/24 0450  WBC 22.0* 19.3*  HGB 15.1 14.3  HCT 43.1 40.4  MCV 91.9 90.8  PLT 254 225   Basic Metabolic Panel: Recent Labs  Lab 01/24/24 1229 01/25/24 0450  NA 138 137  K 4.1 4.0  CL 100 99  CO2 25 23  GLUCOSE 105* 218*  BUN 20 19  CREATININE 1.06 1.12  CALCIUM  9.8 9.0   GFR: CrCl cannot be calculated (Unknown ideal weight.). Liver Function Tests: No results for input(s): AST, ALT, ALKPHOS, BILITOT, PROT, ALBUMIN in the last 168 hours. No results for input(s): LIPASE, AMYLASE in the last 168 hours. No results for input(s): AMMONIA in the last 168 hours. Coagulation  Profile: No results for input(s): INR, PROTIME in the last 168 hours. Cardiac Enzymes: No results for input(s): CKTOTAL, CKMB, CKMBINDEX, TROPONINI in the last 168 hours. ProBNP, BNP (last 5 results) Recent Labs    01/24/24 1627  PROBNP 179.0   HbA1C: Recent Labs    01/25/24 0450  HGBA1C 5.9*   CBG: No results for input(s): GLUCAP in the last 168 hours. Lipid Profile: No results for input(s): CHOL, HDL, LDLCALC, TRIG, CHOLHDL, LDLDIRECT in the last 72 hours. Thyroid Function Tests: No results for input(s): TSH, T4TOTAL, FREET4, T3FREE, THYROIDAB in the last 72 hours. Anemia Panel: No results for input(s): VITAMINB12, FOLATE, FERRITIN, TIBC, IRON, RETICCTPCT in the last 72 hours. Sepsis Labs: No results for input(s): PROCALCITON, LATICACIDVEN in the last 168 hours.  Recent Results (from the past 240 hours)  Culture, blood (Routine X 2) w Reflex to ID Panel     Status: None (Preliminary result)   Collection Time: 01/24/24  7:43 PM   Specimen: BLOOD  Result Value Ref Range Status   Specimen Description   Final    BLOOD BLOOD LEFT FOREARM Performed at [facility], [address] [place], [place] [address]    Special Requests   Final    BOTTLES DRAWN AEROBIC AND ANAEROBIC Blood Culture adequate volume Performed at [facility], [address] [place], [place] [address]    Culture   Final    NO GROWTH 1 DAY Performed at [facility], [address], [place], [place] [address]    Report Status PENDING  Incomplete  Culture, blood (Routine X 2) w Reflex to ID Panel     Status: None (Preliminary result)   Collection Time: 01/24/24  7:45 PM   Specimen: BLOOD  Result Value Ref Range Status   Specimen Description   Final    BLOOD RIGHT ANTECUBITAL Performed at [facility], [address], [place], [place] [address]    Special Requests   Final    BOTTLES DRAWN  AEROBIC AND ANAEROBIC Blood Culture adequate volume Performed at [facility], [address] [place], [place] [address]    Culture   Final    NO GROWTH 1 DAY Performed at [facility], [address], [place], [place] [address]    Report Status PENDING  Incomplete     Radiology Studies: MR THORACIC SPINE WO CONTRAST Result Date: 01/25/2024 EXAM: MRI THORACIC SPINE WITHOUT INTRAVENOUS CONTRAST 01/25/2024 11:43:48 AM TECHNIQUE: Multiplanar multisequence MRI of the thoracic spine was performed without the administration of intravenous contrast. COMPARISON: CTA chest 01/24/2024, PET CT 09/22/2023. CLINICAL HISTORY: 74 year old male with prostate cancer and back pain. FINDINGS: Normal thoracic segmentation on CTA yesterday. BONES AND ALIGNMENT: Heterogeneous bone marrow signal in the visible cervical  spine. No destructive osseous lesion or overt metastasis is evident. Normal thoracic segmentation on CTA yesterday. Intrinsic increased T1 signal throughout the T11 vertebral body. This suggests previous radiation treatment there. Heterogeneous bone marrow signal elsewhere throughout the thoracic spine. No destructive or suspicious thoracic vertebral lesion by non-contrast MRI. T2 superior endplate deformity with STIR hyperintensity (series 19 image 10). This most resembles chronic superior endplate Schmorl node on CT yesterday. Similar finding of the L1 superior endplate, and no marrow edema there (series 18 image 9). No obvious posterior rib metastasis. Normal alignment. Normal vertebral body heights. SPINAL CORD: Thoracic spinal cord signal and morphology are within normal limits. Conus medullaris appears normal at T12-L1. SOFT TISSUES: Trace layering pleural fluid. Streaky abnormal lung parenchyma. This was better evaluated by CTA yesterday. DEGENERATIVE CHANGES: No age advanced thoracic spine degeneration. Capacious underlying thoracic spinal canal, although superimposed dorsal  thoracic epidural lipomatosis from the levels T3 through T8 (series 18 image 12) but no thoracic spinal stenosis. IMPRESSION: 1. No convincing metastatic disease in the thoracic spine on non-contrast MRI. Intrinsic increased T1 signal in the T11 vertebra suggesting prior radiation treatment. 2. T2 superior endplate deformity with mild marrow edema but most resembles degenerative schmorl's node on CT yesterday, and similar chronic finding in the superior L1 vertebra. 3. No age advanced thoracic degeneration, no thoracic spinal stenosis. 4. Abnormal lungs, trace pleural effusion fluid, see CTA yesterday. Electronically signed by: Helayne Hurst MD MD 01/25/2024 12:18 PM EST RP Workstation: [id number]   CT Angio Chest Pulmonary Embolism (PE) W or WO Contrast Result Date: 01/24/2024 EXAM: CTA of the Chest with contrast for PE 01/24/2024 05:48:18 PM TECHNIQUE: CTA of the chest was performed without and with the administration of 75 mL of iohexol  (OMNIPAQUE ) 350 MG/ML intravenous contrast. Multiplanar reformatted images are provided for review. MIP images are provided for review. Automated exposure control, iterative reconstruction, and/or weight based adjustment of the mA/kV was utilized to reduce the radiation dose to as low as reasonably achievable. COMPARISON: PET CT 09/22/2023. CLINICAL HISTORY: sob, wheezing, new FINDINGS: PULMONARY ARTERIES: Pulmonary arteries are adequately opacified for evaluation. No pulmonary embolism. Main pulmonary artery is normal in caliber. MEDIASTINUM: The heart is mildly enlarged. The pericardium demonstrates no acute abnormality. There is no acute abnormality of the thoracic aorta. LYMPH NODES: No mediastinal, hilar or axillary lymphadenopathy. LUNGS AND PLEURA: Nodular density is seen in the region of the right lower lobe medially abutting the pleura measuring 1.8 x 2.0 cm on image 5/59. There is a triangular shape focal area of dense airspace consolidation without air bronchograms in  the right lower lobe abutting the right hilum. There are additional patchy peripheral interstitial and ground glass opacities throughout both lungs more prominent in the mid and lower lungs. No pleural effusion or pneumothorax. UPPER ABDOMEN: There is a 2.8 cm focal area of hypodensity measuring 22 Hounsfield units in the left kidney which may represent a cyst or complex cyst. SOFT TISSUES AND BONES: No acute bone or soft tissue abnormality. IMPRESSION: 1. No evidence of pulmonary embolism. 2. Bilateral patchy interstitial and ground-glass opacities AND focal right lower lobe consolidation, favored infectious/inflammatory. 3. Right lower lobe pleural-based solid nodular density measuring up to 2.0 cm; consider non-contrast chest CT at 3 months, PET/CT, or tissue sampling as per Fleischner Society Guidelines. 4. Stable 2.8 cm left renal hypodense lesion; likely cyst or complex cyst. Electronically signed by: Greig Pique MD MD 01/24/2024 06:11 PM EST RP Workstation: [id number]    Scheduled Meds:  amLODipine   10 mg Oral Daily   aspirin  EC  81 mg Oral Daily   atorvastatin   10 mg Oral Daily   azithromycin   500 mg Oral Daily   clopidogrel   75 mg Oral Daily   enoxaparin  (LOVENOX ) injection  40 mg Subcutaneous Q24H   gabapentin   300 mg Oral BID   oseltamivir   75 mg Oral BID   predniSONE   40 mg Oral Q breakfast   Continuous Infusions:  cefTRIAXone  (ROCEPHIN )  IV 2 g (01/25/24 2051)     LOS: 1 day   Time spent: 38 minutes  Casimer Dare, MD  [facility]  01/26/2024, 12:48 PM   "

## 2024-01-26 NOTE — TOC Initial Note (Signed)
 Transition of Care [facility]) - Initial/Assessment Note   Patient Details  Name: Dennis Patel MRN: [id number] Date of Birth: [birth year]  [facility] of [facility]) CM/SW Contact:    Duwaine GORMAN Aran, LCSW Phone Number: 01/26/2024, 11:33 AM  Clinical Narrative: Patient is from home. Patient is currently requiring 3.5L/min oxygen. Care management following for possible home oxygen needs.  Expected Discharge Plan: Home/Self Care Barriers to Discharge: Continued Medical Work up  Expected Discharge Plan and Services In-house Referral: Clinical Social Work Living arrangements for the past 2 months: Single Family Home  Prior Living Arrangements/Services Living arrangements for the past 2 months: Single Family Home Lives with:: Self Patient language and need for interpreter reviewed:: Yes Do you feel safe going back to the place where you live?: Yes      Need for Family Participation in Patient Care: No (Comment) Care giver support system in place?: Yes (comment) Criminal Activity/Legal Involvement Pertinent to Current Situation/Hospitalization: No - Comment as needed  Emotional Assessment Alcohol / Substance Use: Not Applicable Psych Involvement: No (comment)  Admission diagnosis:  Wheezing [R06.2] Peripheral vascular disease [I73.9] Pneumonia [J18.9] Pneumonia of both lungs due to infectious organism, unspecified part of lung [J18.9] Patient Active Problem List   Diagnosis Date Noted   Pneumonia 01/24/2024   Influenza and pneumonia 01/24/2024   Back pain 01/24/2024   Malignant neoplasm of prostate metastatic to bone (HCC) 10/23/2023   Critical limb ischemia of right lower extremity (HCC) 03/16/2023   Aortoiliac occlusive disease (HCC) 03/16/2023   Genetic testing 11/19/2021   Family history of ovarian cancer 11/02/2021   Port-A-Cath in place 01/31/2018   Goals of care, counseling/discussion 01/22/2018   Malignant neoplasm of prostate (HCC) 12/29/2017   Hematoma 09/08/2017   MDD  (major depressive disorder), recurrent episode, mild    Opiate overdose, intentional self-harm, initial encounter (HCC) 07/11/2017   MDD (major depressive disorder), recurrent severe, without psychosis (HCC)    Peripheral vascular disease 03/17/2017   PCP:  Auston Opal, DO Pharmacy:   [id number] [address], [place] - [address] AT [facility] OF [address] [place] [address] Phone: [phone] Fax: [phone]  Social Drivers of Health (SDOH) Social History: SDOH Screenings   Food Insecurity: No Food Insecurity (01/25/2024)  Housing: Low Risk (01/25/2024)  Transportation Needs: No Transportation Needs (01/25/2024)  Utilities: Not At Risk (01/25/2024)  Alcohol Screen: Low Risk (10/23/2023)  Depression (PHQ2-9): Low Risk (10/23/2023)  Social Connections: Moderately Integrated (01/25/2024)  Tobacco Use: High Risk (01/24/2024)   SDOH Interventions:    Readmission Risk Interventions     No data to display

## 2024-01-27 ENCOUNTER — Other Ambulatory Visit (HOSPITAL_COMMUNITY): Payer: Self-pay

## 2024-01-27 ENCOUNTER — Encounter: Payer: Self-pay | Admitting: Hematology and Oncology

## 2024-01-27 MED ORDER — OSELTAMIVIR PHOSPHATE 30 MG PO CAPS
30.0000 mg | ORAL_CAPSULE | Freq: Two times a day (BID) | ORAL | 0 refills | Status: AC
  Filled 2024-01-27: qty 4, 2d supply, fill #0

## 2024-01-27 MED ORDER — AZITHROMYCIN 250 MG PO TABS
500.0000 mg | ORAL_TABLET | Freq: Every day | ORAL | 0 refills | Status: AC
  Filled 2024-01-27: qty 4, 2d supply, fill #0

## 2024-01-27 MED ORDER — GUAIFENESIN ER 600 MG PO TB12: 600.0000 mg | ORAL_TABLET | Freq: Two times a day (BID) | ORAL | Status: DC

## 2024-01-27 MED ORDER — ABIRATERONE ACETATE 250 MG PO TABS: 1000.0000 mg | ORAL_TABLET | Freq: Every day | ORAL | Status: DC

## 2024-01-27 MED ORDER — DEXTROMETHORPHAN-GUAIFENESIN 10-100 MG/5ML PO LIQD
5.0000 mL | ORAL | 0 refills | Status: AC | PRN
  Filled 2024-01-27: qty 118, 4d supply, fill #0

## 2024-01-27 MED ORDER — GUAIFENESIN ER 600 MG PO TB12
600.0000 mg | ORAL_TABLET | Freq: Two times a day (BID) | ORAL | 0 refills | Status: AC
  Filled 2024-01-27: qty 10, 5d supply, fill #0

## 2024-01-27 MED ORDER — AMOXICILLIN-POT CLAVULANATE 875-125 MG PO TABS
1.0000 | ORAL_TABLET | Freq: Two times a day (BID) | ORAL | 0 refills | Status: AC
  Filled 2024-01-27: qty 10, 5d supply, fill #0

## 2024-01-27 NOTE — Progress Notes (Signed)
 Discharge meds in a secure bag delivered to patient by this RN

## 2024-01-27 NOTE — Discharge Summary (Signed)
 [facility] Discharge Summary   Patient: Dennis Patel FMW:[id number]  PCP: Auston Opal, DO  Date of admission: 01/24/2024   Date of discharge:  01/27/2024     Discharge Diagnoses:  Principal Problem:   Pneumonia Active Problems:   Peripheral vascular disease   Malignant neoplasm of prostate (HCC)   Influenza and pneumonia   Back pain   Admitted From: Home Disposition:  Home   Recommendations for Outpatient Follow-up:  Follow-up with PCP in 1 week Follow up LABS/TEST:  Repeat chest x-ray in 4 weeks for resolution of pneumonia   Follow-up Information     Auston Opal, DO Follow up in 1 week(s).   Specialty: Family Medicine Contact information: [address] [place] [address] [phone]                Diet recommendation: Cardiac diet  Activity: The patient is advised to gradually reintroduce usual activities, as tolerated  Discharge Condition: stable  Code Status: Full code   History of present illness: As per the H and P dictated on admission.  Hospital Course:  74 year old male with PMH of metastatic breast cancer, HTN, PAD who presented to the ED with shortness of breath.  A few days ago he went to see his PCP, at the office he was found to be positive for influenza but he was outside the window for Tamiflu  treatment and was not given, but his symptoms continue to worsen thereafter.  On arrival O2 sats were 88% on room air.  He also complained of pain in his upper back.  CTA was negative for PE which showed evidence for pneumonia, and he was hospitalized for IV antibiotics and further management.    Assessment and Plan:   # Acute hypoxic respiratory failure due to influenza and superimposed bacterial pneumonia - tested positive fro FluA on 1/2 (results in CareEverywhere), and CTA chest showed focal right lower lobe consolidation, favored infectious/inflammatory - continue ceftriaxone  and azithromycin  - continue Tamiflu , given  that he is immunocompromised while on active prostate cancer therapy. S/p nebs as needed S/p supplemental oxygen, gradually weaned off, currently saturating well on room air. Patient was discharged on Augmentin  twice daily for 5 days, azithromycin  500 mg p.o. daily for 2 days and continue Tamiflu  30 mg p.o. twice daily for 2 days to complete 5-day course. Follow-up with PCP to repeat chest x-ray after 4 weeks for resolution of pneumonia.    # Metastatic prostate cancer - held Zytiga  during hospital stay.  Resumed on discharge.   - continue prednisone , s/p higher dose (40 mg daily) given during hospital stay, continued prednisone  5 mg home dose on discharge.   # Back pain - PET from last year showed possible metastatic lesion in the T11 area.  MRI of the thoracic spine was negative for metastatic lesion   # Right lower lobe pulmonary nodule Noted on CT angiogram.  This measured about 2 cm.  Will need close monitoring as outpatient   # History of peripheral artery disease Continued  aspirin  and Plavix .  He has stents in his lower extremities.   # Essential hypertension - resumed amlodipine , and Hydrochlorothiazide  on discharge    Body mass index is 26.35 kg/m.  Nutrition Interventions:  Patient was ambulatory without any assistance. On the day of the discharge the patient's vitals were stable, and no other acute medical condition were reported by patient. the patient was felt safe to be discharge at Home.  Consultants: None Procedures: None  Discharge Exam: General: Appear in no distress, Oral Mucosa Clear, moist. Cardiovascular: S1 and S2 Present, no Murmur, Respiratory: normal respiratory effort, Bilateral Air entry present and no Crackles, no wheezes Abdomen: Bowel Sound present, Soft and no tenderness. Extremities: no Pedal edema, no calf tenderness Neurology: alert and oriented to time, place, and person affect appropriate.  Filed Weights   01/26/24 1716  Weight: 78.6 kg    Vitals:   01/27/24 1000 01/27/24 1250  BP:  (!) 126/55  Pulse:  (!) 59  Resp:  18  Temp:  98.2 F (36.8 C)  SpO2: 95% 94%    DISCHARGE MEDICATION: Allergies as of 01/27/2024   No Known Allergies      Medication List     TAKE these medications    abiraterone  acetate 250 MG tablet Commonly known as: Zytiga  Take 4 tablets (1,000 mg total) by mouth daily. Take on an empty stomach 1 hour before or 2 hours after a meal   albuterol  108 (90 Base) MCG/ACT inhaler Commonly known as: VENTOLIN  HFA Inhale 2 puffs into the lungs every 4 (four) hours as needed for wheezing or shortness of breath.   amLODipine  10 MG tablet Commonly known as: NORVASC  Take 1 tablet (10 mg total) by mouth daily.   amoxicillin -clavulanate 875-125 MG tablet Commonly known as: AUGMENTIN  Take 1 tablet by mouth 2 (two) times daily for 5 days.   aspirin  EC 81 MG tablet Take 81 mg by mouth daily. Swallow whole.   atorvastatin  10 MG tablet Commonly known as: LIPITOR TAKE 1 TABLET BY MOUTH EVERY DAY   azithromycin  250 MG tablet Commonly known as: ZITHROMAX  Take 2 tablets (500 mg total) by mouth daily for 2 days.   CALCIUM  + D3 PO Take 1 tablet by mouth daily with breakfast.   clopidogrel  75 MG tablet Commonly known as: PLAVIX  Take 1 tablet (75 mg total) by mouth daily.   gabapentin  300 MG capsule Commonly known as: NEURONTIN  Take 1 capsule (300 mg total) by mouth 2 (two) times daily. What changed: when to take this   guaiFENesin  600 MG 12 hr tablet Commonly known as: MUCINEX  Take 1 tablet (600 mg total) by mouth 2 (two) times daily for 5 days.   guaiFENesin -dextromethorphan  100-10 MG/5ML syrup Commonly known as: ROBITUSSIN DM Take 5 mLs by mouth every 4 (four) hours as needed for cough (chest congestion).   hydrochlorothiazide  12.5 MG tablet Commonly known as: HYDRODIURIL  Take 12.5 mg by mouth every morning.   naproxen sodium 220 MG tablet Commonly known as: ALEVE Take 220 mg by mouth  in the morning.   ONE DAILY MULTIVITAMIN MEN PO Take 1 tablet by mouth in the morning.   oseltamivir  30 MG capsule Commonly known as: TAMIFLU  Take 1 capsule (30 mg total) by mouth 2 (two) times daily for 2 days.   Pepcid  AC Maximum Strength 20 MG tablet Generic drug: famotidine  Take 20 mg by mouth daily before breakfast.   predniSONE  5 MG tablet Commonly known as: DELTASONE  TAKE 1 TABLET BY MOUTH EVERY DAY WITH BREAKFAST What changed:  See the new instructions. Another medication with the same name was removed. Continue taking this medication, and follow the directions you see here.   traZODone  50 MG tablet Commonly known as: DESYREL  TAKE 1 TABLET (50 MG TOTAL) BY MOUTH AT BEDTIME AS NEEDED. FOR SLEEP What changed:  how much to take when to take this   Tylenol  PM Extra Strength 500-25 MG Tabs tablet Generic drug: diphenhydramine-acetaminophen  Take 1 tablet by  mouth at bedtime.       Allergies[1] Discharge Instructions     Call MD for:  difficulty breathing, headache or visual disturbances   Complete by: As directed    Call MD for:  extreme fatigue   Complete by: As directed    Call MD for:  persistant dizziness or light-headedness   Complete by: As directed    Call MD for:  persistant nausea and vomiting   Complete by: As directed    Call MD for:  severe uncontrolled pain   Complete by: As directed    Call MD for:  temperature >100.4   Complete by: As directed    Discharge instructions   Complete by: As directed    Follow-up with PCP in 1 week Repeat chest x-ray in 4 weeks for resolution of pneumonia   Increase activity slowly   Complete by: As directed        The results of significant diagnostics from this hospitalization (including imaging, microbiology, ancillary and laboratory) are listed below for reference.    Significant Diagnostic Studies: MR THORACIC SPINE WO CONTRAST Result Date: 01/25/2024 EXAM: MRI THORACIC SPINE WITHOUT INTRAVENOUS CONTRAST  01/25/2024 11:43:48 AM TECHNIQUE: Multiplanar multisequence MRI of the thoracic spine was performed without the administration of intravenous contrast. COMPARISON: CTA chest 01/24/2024, PET CT 09/22/2023. CLINICAL HISTORY: 74 year old male with prostate cancer and back pain. FINDINGS: Normal thoracic segmentation on CTA yesterday. BONES AND ALIGNMENT: Heterogeneous bone marrow signal in the visible cervical spine. No destructive osseous lesion or overt metastasis is evident. Normal thoracic segmentation on CTA yesterday. Intrinsic increased T1 signal throughout the T11 vertebral body. This suggests previous radiation treatment there. Heterogeneous bone marrow signal elsewhere throughout the thoracic spine. No destructive or suspicious thoracic vertebral lesion by non-contrast MRI. T2 superior endplate deformity with STIR hyperintensity (series 19 image 10). This most resembles chronic superior endplate Schmorl node on CT yesterday. Similar finding of the L1 superior endplate, and no marrow edema there (series 18 image 9). No obvious posterior rib metastasis. Normal alignment. Normal vertebral body heights. SPINAL CORD: Thoracic spinal cord signal and morphology are within normal limits. Conus medullaris appears normal at T12-L1. SOFT TISSUES: Trace layering pleural fluid. Streaky abnormal lung parenchyma. This was better evaluated by CTA yesterday. DEGENERATIVE CHANGES: No age advanced thoracic spine degeneration. Capacious underlying thoracic spinal canal, although superimposed dorsal thoracic epidural lipomatosis from the levels T3 through T8 (series 18 image 12) but no thoracic spinal stenosis. IMPRESSION: 1. No convincing metastatic disease in the thoracic spine on non-contrast MRI. Intrinsic increased T1 signal in the T11 vertebra suggesting prior radiation treatment. 2. T2 superior endplate deformity with mild marrow edema but most resembles degenerative schmorl's node on CT yesterday, and similar chronic  finding in the superior L1 vertebra. 3. No age advanced thoracic degeneration, no thoracic spinal stenosis. 4. Abnormal lungs, trace pleural effusion fluid, see CTA yesterday. Electronically signed by: Helayne Hurst MD MD 01/25/2024 12:18 PM EST RP Workstation: [id number]   CT Angio Chest Pulmonary Embolism (PE) W or WO Contrast Result Date: 01/24/2024 EXAM: CTA of the Chest with contrast for PE 01/24/2024 05:48:18 PM TECHNIQUE: CTA of the chest was performed without and with the administration of 75 mL of iohexol  (OMNIPAQUE ) 350 MG/ML intravenous contrast. Multiplanar reformatted images are provided for review. MIP images are provided for review. Automated exposure control, iterative reconstruction, and/or weight based adjustment of the mA/kV was utilized to reduce the radiation dose to as low as reasonably achievable. COMPARISON: PET CT  09/22/2023. CLINICAL HISTORY: sob, wheezing, new FINDINGS: PULMONARY ARTERIES: Pulmonary arteries are adequately opacified for evaluation. No pulmonary embolism. Main pulmonary artery is normal in caliber. MEDIASTINUM: The heart is mildly enlarged. The pericardium demonstrates no acute abnormality. There is no acute abnormality of the thoracic aorta. LYMPH NODES: No mediastinal, hilar or axillary lymphadenopathy. LUNGS AND PLEURA: Nodular density is seen in the region of the right lower lobe medially abutting the pleura measuring 1.8 x 2.0 cm on image 5/59. There is a triangular shape focal area of dense airspace consolidation without air bronchograms in the right lower lobe abutting the right hilum. There are additional patchy peripheral interstitial and ground glass opacities throughout both lungs more prominent in the mid and lower lungs. No pleural effusion or pneumothorax. UPPER ABDOMEN: There is a 2.8 cm focal area of hypodensity measuring 22 Hounsfield units in the left kidney which may represent a cyst or complex cyst. SOFT TISSUES AND BONES: No acute bone or soft tissue  abnormality. IMPRESSION: 1. No evidence of pulmonary embolism. 2. Bilateral patchy interstitial and ground-glass opacities AND focal right lower lobe consolidation, favored infectious/inflammatory. 3. Right lower lobe pleural-based solid nodular density measuring up to 2.0 cm; consider non-contrast chest CT at 3 months, PET/CT, or tissue sampling as per Fleischner Society Guidelines. 4. Stable 2.8 cm left renal hypodense lesion; likely cyst or complex cyst. Electronically signed by: Greig Pique MD MD 01/24/2024 06:11 PM EST RP Workstation: [id number]   DG Chest 2 View Result Date: 01/24/2024 CLINICAL DATA:  Shortness of breath EXAM: DG CHEST 2V COMPARISON:  January 01, 2022 FINDINGS: The heart size and mediastinal contours are within normal limits. Stable reticular densities are noted throughout both lungs most consistent with scarring or fibrosis. No definite acute abnormality is noted. The visualized skeletal structures are unremarkable. IMPRESSION: Stable bilateral lung opacities most consistent with scarring or fibrosis. Electronically Signed   By: Lynwood Landy Raddle M.D.   On: 01/24/2024 12:16    Microbiology: Recent Results (from the past 240 hours)  Culture, blood (Routine X 2) w Reflex to ID Panel     Status: None (Preliminary result)   Collection Time: 01/24/24  7:43 PM   Specimen: BLOOD  Result Value Ref Range Status   Specimen Description   Final    BLOOD BLOOD LEFT FOREARM Performed at [facility], [address] [place], [place] [address]    Special Requests   Final    BOTTLES DRAWN AEROBIC AND ANAEROBIC Blood Culture adequate volume Performed at [facility], [address] [place], [place] [address]    Culture   Final    NO GROWTH 2 DAYS Performed at [facility], [address], [place], [place] [address]    Report Status PENDING  Incomplete  Culture, blood (Routine X 2) w Reflex to ID Panel     Status: None (Preliminary result)    Collection Time: 01/24/24  7:45 PM   Specimen: BLOOD  Result Value Ref Range Status   Specimen Description   Final    BLOOD RIGHT ANTECUBITAL Performed at [facility], [address], [place], [place] [address]    Special Requests   Final    BOTTLES DRAWN AEROBIC AND ANAEROBIC Blood Culture adequate volume Performed at [facility], [address], [place], [place] [address]    Culture   Final    NO GROWTH 2 DAYS Performed at [facility], [address], [place],  [place] [address]    Report Status PENDING  Incomplete     Labs: CBC: Recent Labs  Lab 01/24/24 1229 01/25/24 0450  WBC 22.0* 19.3*  HGB 15.1 14.3  HCT 43.1 40.4  MCV 91.9 90.8  PLT 254 225   Basic Metabolic Panel: Recent Labs  Lab 01/24/24 1229 01/25/24 0450  NA 138 137  K 4.1 4.0  CL 100 99  CO2 25 23  GLUCOSE 105* 218*  BUN 20 19  CREATININE 1.06 1.12  CALCIUM  9.8 9.0   Liver Function Tests: No results for input(s): AST, ALT, ALKPHOS, BILITOT, PROT, ALBUMIN in the last 168 hours. No results for input(s): LIPASE, AMYLASE in the last 168 hours. No results for input(s): AMMONIA in the last 168 hours. Cardiac Enzymes: No results for input(s): CKTOTAL, CKMB, CKMBINDEX, TROPONINI in the last 168 hours. BNP (last 3 results) No results for input(s): BNP in the last 8760 hours. CBG: No results for input(s): GLUCAP in the last 168 hours.  Time spent: 35 minutes  Signed:  Elvan Sor  [facility] 01/27/2024 1:09 PM      [1] No Known Allergies

## 2024-01-27 NOTE — Progress Notes (Signed)
 Pt ambulating in hallway on room air at 95% spO2

## 2024-01-27 NOTE — Progress Notes (Signed)
 This shift pt is on 2L of oxygen satting at 97-99%. Tapered to 1L then RA @94 %. Pt maintains normal unlabored breathing. Plan of care ongoing.

## 2024-01-27 NOTE — Progress Notes (Signed)
 Iv removed. Discharge paperwork and education completed with the patient. Waiting on meds to be delivered.

## 2024-01-27 NOTE — Plan of Care (Signed)
" °  Problem: Education: Goal: Knowledge of General Education information will improve Description: Including pain rating scale, medication(s)/side effects and non-pharmacologic comfort measures Outcome: Progressing   Problem: Health Behavior/Discharge Planning: Goal: Ability to manage health-related needs will improve Outcome: Progressing   Problem: Clinical Measurements: Goal: Ability to maintain clinical measurements within normal limits will improve Outcome: Progressing Goal: Will remain free from infection Outcome: Progressing Goal: Diagnostic test results will improve Outcome: Progressing Goal: Respiratory complications will improve Outcome: Progressing   Problem: Activity: Goal: Risk for activity intolerance will decrease Outcome: Progressing   Problem: Nutrition: Goal: Adequate nutrition will be maintained Outcome: Progressing   Problem: Elimination: Goal: Will not experience complications related to bowel motility Outcome: Progressing   Problem: Pain Managment: Goal: General experience of comfort will improve and/or be controlled Outcome: Progressing   "

## 2024-01-30 LAB — CULTURE, BLOOD (ROUTINE X 2)
Culture: NO GROWTH
Culture: NO GROWTH
Special Requests: ADEQUATE
Special Requests: ADEQUATE

## 2024-01-31 ENCOUNTER — Other Ambulatory Visit: Payer: Self-pay

## 2024-02-02 ENCOUNTER — Other Ambulatory Visit (HOSPITAL_COMMUNITY): Payer: Self-pay

## 2024-02-02 NOTE — Progress Notes (Signed)
 Specialty Pharmacy Refill Coordination Note  Dennis Patel is a 74 y.o. male contacted today regarding refills of specialty medication(s) Abiraterone  Acetate (ZYTIGA )   Patient requested Delivery   Delivery date: 02/06/24   Verified address: [address] [place] [address]   Medication will be filled on: 02/05/24

## 2024-02-09 ENCOUNTER — Other Ambulatory Visit: Payer: Self-pay | Admitting: Pharmacist

## 2024-02-09 ENCOUNTER — Other Ambulatory Visit: Payer: Self-pay

## 2024-02-09 NOTE — Progress Notes (Signed)
 Specialty Pharmacy Ongoing Clinical Assessment Note  Dennis Patel is a 74 y.o. male who is being followed by the specialty pharmacy service for RxSp Oncology   Patient's specialty medication(s) reviewed today: Abiraterone  Acetate (ZYTIGA )   Missed doses in the last 4 weeks: 9 (patient was hospitalized 1/7-1/10 and instructed to hold zytiga  while on antibiotics.)   Patient/Caregiver did not have any additional questions or concerns.   Therapeutic benefit summary: Patient is achieving benefit   Adverse events/side effects summary: No adverse events/side effects   Patient's therapy is appropriate to: Continue    Goals Addressed             This Visit's Progress    Slow Disease Progression   On track    Patient is on track. Patient will maintain adherence and be monitored by provider to determine if a change in treatment plan is warranted. PSA has increased from 0.2 in November 2024 to 0.7 on 06/27/23. Dr. Federico is aware of slow increase and monitoring carefully.         Follow up: 6 months  Lyle LELON Chalk Specialty Pharmacist

## 2024-02-13 ENCOUNTER — Other Ambulatory Visit: Payer: Self-pay | Admitting: Hematology and Oncology

## 2024-02-14 ENCOUNTER — Inpatient Hospital Stay: Payer: Medicare Other | Admitting: Hematology and Oncology

## 2024-02-14 ENCOUNTER — Inpatient Hospital Stay: Payer: Medicare Other

## 2024-02-28 ENCOUNTER — Inpatient Hospital Stay

## 2024-02-28 ENCOUNTER — Inpatient Hospital Stay: Attending: Internal Medicine

## 2024-02-28 ENCOUNTER — Inpatient Hospital Stay: Admitting: Hematology and Oncology

## 2024-03-15 ENCOUNTER — Ambulatory Visit (HOSPITAL_COMMUNITY)

## 2024-03-15 ENCOUNTER — Ambulatory Visit: Admitting: Vascular Surgery
